# Patient Record
Sex: Female | Born: 1950
Health system: Southern US, Community
[De-identification: ages and names within clinical notes are randomized; demographics above are authoritative.]

## PROBLEM LIST (undated history)

## (undated) DIAGNOSIS — R0600 Dyspnea, unspecified: Secondary | ICD-10-CM

## (undated) DIAGNOSIS — D649 Anemia, unspecified: Secondary | ICD-10-CM

## (undated) DIAGNOSIS — F988 Other specified behavioral and emotional disorders with onset usually occurring in childhood and adolescence: Secondary | ICD-10-CM

## (undated) DIAGNOSIS — Z87442 Personal history of urinary calculi: Secondary | ICD-10-CM

## (undated) DIAGNOSIS — R109 Unspecified abdominal pain: Secondary | ICD-10-CM

## (undated) DIAGNOSIS — M519 Unspecified thoracic, thoracolumbar and lumbosacral intervertebral disc disorder: Secondary | ICD-10-CM

## (undated) DIAGNOSIS — J449 Chronic obstructive pulmonary disease, unspecified: Secondary | ICD-10-CM

## (undated) DIAGNOSIS — M545 Low back pain: Principal | ICD-10-CM

## (undated) DIAGNOSIS — J309 Allergic rhinitis, unspecified: Secondary | ICD-10-CM

## (undated) DIAGNOSIS — R10819 Abdominal tenderness, unspecified site: Secondary | ICD-10-CM

## (undated) DIAGNOSIS — K219 Gastro-esophageal reflux disease without esophagitis: Secondary | ICD-10-CM

## (undated) DIAGNOSIS — K0501 Acute gingivitis, non-plaque induced: Secondary | ICD-10-CM

## (undated) DIAGNOSIS — J441 Chronic obstructive pulmonary disease with (acute) exacerbation: Secondary | ICD-10-CM

## (undated) DIAGNOSIS — M549 Dorsalgia, unspecified: Secondary | ICD-10-CM

## (undated) DIAGNOSIS — F1011 Alcohol abuse, in remission: Secondary | ICD-10-CM

## (undated) DIAGNOSIS — E039 Hypothyroidism, unspecified: Secondary | ICD-10-CM

## (undated) DIAGNOSIS — F411 Generalized anxiety disorder: Secondary | ICD-10-CM

## (undated) DIAGNOSIS — G8921 Chronic pain due to trauma: Secondary | ICD-10-CM

## (undated) DIAGNOSIS — A088 Other specified intestinal infections: Secondary | ICD-10-CM

## (undated) DIAGNOSIS — N83209 Unspecified ovarian cyst, unspecified side: Secondary | ICD-10-CM

## (undated) DIAGNOSIS — I10 Essential (primary) hypertension: Secondary | ICD-10-CM

## (undated) DIAGNOSIS — F329 Major depressive disorder, single episode, unspecified: Secondary | ICD-10-CM

## (undated) DIAGNOSIS — N390 Urinary tract infection, site not specified: Secondary | ICD-10-CM

## (undated) DIAGNOSIS — M479 Spondylosis, unspecified: Secondary | ICD-10-CM

## (undated) DIAGNOSIS — M1990 Unspecified osteoarthritis, unspecified site: Secondary | ICD-10-CM

## (undated) DIAGNOSIS — R51 Headache: Secondary | ICD-10-CM

## (undated) DIAGNOSIS — E559 Vitamin D deficiency, unspecified: Secondary | ICD-10-CM

## (undated) HISTORY — PX: LIPOSUCTION: SHX10

## (undated) HISTORY — PX: OTHER SURGICAL HISTORY: SHX169

## (undated) HISTORY — DX: Unspecified abdominal pain: R10.9

## (undated) HISTORY — PX: ABDOMINAL HYSTERECTOMY: SHX81

## (undated) HISTORY — PX: CATARACT EXTRACTION W/ INTRAOCULAR LENS IMPLANT: SHX1309

## (undated) HISTORY — DX: Allergic rhinitis, unspecified: J30.9

## (undated) HISTORY — DX: Chronic obstructive pulmonary disease with (acute) exacerbation: J44.1

## (undated) HISTORY — DX: Spondylosis, unspecified: M47.9

## (undated) HISTORY — DX: Unspecified thoracic, thoracolumbar and lumbosacral intervertebral disc disorder: M51.9

## (undated) HISTORY — DX: Other specified behavioral and emotional disorders with onset usually occurring in childhood and adolescence: F98.8

## (undated) HISTORY — DX: Unspecified ovarian cyst, unspecified side: N83.209

## (undated) HISTORY — DX: Alcohol abuse, in remission: F10.11

## (undated) HISTORY — DX: Other specified intestinal infections: A08.8

## (undated) HISTORY — DX: Gastro-esophageal reflux disease without esophagitis: K21.9

## (undated) HISTORY — PX: LUMBAR DISC SURGERY: SHX700

## (undated) HISTORY — DX: Urinary tract infection, site not specified: N39.0

## (undated) HISTORY — DX: Abdominal tenderness, unspecified site: R10.819

## (undated) HISTORY — DX: Dorsalgia, unspecified: M54.9

## (undated) HISTORY — DX: Headache: R51

## (undated) HISTORY — DX: Vitamin D deficiency, unspecified: E55.9

## (undated) HISTORY — DX: Major depressive disorder, single episode, unspecified: F32.9

## (undated) HISTORY — DX: Acute gingivitis, non-plaque induced: K05.01

## (undated) HISTORY — DX: Generalized anxiety disorder: F41.1

## (undated) HISTORY — DX: Chronic obstructive pulmonary disease, unspecified: J44.9

## (undated) HISTORY — DX: Chronic pain due to trauma: G89.21

## (undated) HISTORY — DX: Unspecified osteoarthritis, unspecified site: M19.90

## (undated) HISTORY — PX: CHOLECYSTECTOMY: SHX55

## (undated) HISTORY — DX: Hypothyroidism, unspecified: E03.9

## (undated) HISTORY — DX: Low back pain: M54.5

---

## 1998-06-26 ENCOUNTER — Emergency Department (HOSPITAL_COMMUNITY): Admission: EM | Admit: 1998-06-26 | Discharge: 1998-06-26 | Payer: Self-pay | Admitting: Emergency Medicine

## 1999-12-20 ENCOUNTER — Other Ambulatory Visit: Admission: RE | Admit: 1999-12-20 | Discharge: 1999-12-20 | Payer: Self-pay | Admitting: Obstetrics and Gynecology

## 1999-12-29 ENCOUNTER — Encounter: Payer: Self-pay | Admitting: Obstetrics and Gynecology

## 1999-12-29 ENCOUNTER — Encounter: Admission: RE | Admit: 1999-12-29 | Discharge: 1999-12-29 | Payer: Self-pay | Admitting: Obstetrics and Gynecology

## 2000-01-05 ENCOUNTER — Encounter: Payer: Self-pay | Admitting: Gastroenterology

## 2000-01-05 ENCOUNTER — Ambulatory Visit (HOSPITAL_COMMUNITY): Admission: RE | Admit: 2000-01-05 | Discharge: 2000-01-05 | Payer: Self-pay | Admitting: Gastroenterology

## 2000-01-10 ENCOUNTER — Encounter: Payer: Self-pay | Admitting: General Surgery

## 2000-01-11 ENCOUNTER — Encounter (INDEPENDENT_AMBULATORY_CARE_PROVIDER_SITE_OTHER): Payer: Self-pay | Admitting: *Deleted

## 2000-01-11 ENCOUNTER — Ambulatory Visit (HOSPITAL_COMMUNITY): Admission: RE | Admit: 2000-01-11 | Discharge: 2000-01-12 | Payer: Self-pay | Admitting: General Surgery

## 2000-12-22 ENCOUNTER — Other Ambulatory Visit: Admission: RE | Admit: 2000-12-22 | Discharge: 2000-12-22 | Payer: Self-pay | Admitting: Obstetrics and Gynecology

## 2002-03-22 ENCOUNTER — Encounter: Payer: Self-pay | Admitting: Emergency Medicine

## 2002-03-22 ENCOUNTER — Emergency Department (HOSPITAL_COMMUNITY): Admission: EM | Admit: 2002-03-22 | Discharge: 2002-03-22 | Payer: Self-pay | Admitting: Emergency Medicine

## 2004-02-02 ENCOUNTER — Emergency Department (HOSPITAL_COMMUNITY): Admission: EM | Admit: 2004-02-02 | Discharge: 2004-02-02 | Payer: Self-pay | Admitting: Family Medicine

## 2004-11-21 ENCOUNTER — Encounter: Payer: Self-pay | Admitting: Internal Medicine

## 2004-11-21 LAB — CONVERTED CEMR LAB

## 2004-11-21 LAB — HM MAMMOGRAPHY

## 2006-01-26 ENCOUNTER — Emergency Department (HOSPITAL_COMMUNITY): Admission: EM | Admit: 2006-01-26 | Discharge: 2006-01-26 | Payer: Self-pay | Admitting: Family Medicine

## 2006-08-18 ENCOUNTER — Ambulatory Visit: Payer: Self-pay | Admitting: Internal Medicine

## 2006-11-06 ENCOUNTER — Ambulatory Visit: Payer: Self-pay | Admitting: Internal Medicine

## 2006-12-18 ENCOUNTER — Ambulatory Visit: Payer: Self-pay | Admitting: Internal Medicine

## 2006-12-19 ENCOUNTER — Ambulatory Visit: Payer: Self-pay | Admitting: Internal Medicine

## 2007-07-02 ENCOUNTER — Ambulatory Visit: Payer: Self-pay | Admitting: Internal Medicine

## 2007-07-02 DIAGNOSIS — E039 Hypothyroidism, unspecified: Secondary | ICD-10-CM | POA: Insufficient documentation

## 2007-07-02 DIAGNOSIS — J449 Chronic obstructive pulmonary disease, unspecified: Secondary | ICD-10-CM | POA: Insufficient documentation

## 2007-07-02 DIAGNOSIS — M1990 Unspecified osteoarthritis, unspecified site: Secondary | ICD-10-CM

## 2007-07-02 DIAGNOSIS — F3289 Other specified depressive episodes: Secondary | ICD-10-CM

## 2007-07-02 DIAGNOSIS — F411 Generalized anxiety disorder: Secondary | ICD-10-CM | POA: Insufficient documentation

## 2007-07-02 DIAGNOSIS — G8921 Chronic pain due to trauma: Secondary | ICD-10-CM | POA: Insufficient documentation

## 2007-07-02 DIAGNOSIS — F32A Depression, unspecified: Secondary | ICD-10-CM | POA: Insufficient documentation

## 2007-07-02 DIAGNOSIS — F329 Major depressive disorder, single episode, unspecified: Secondary | ICD-10-CM

## 2007-07-02 DIAGNOSIS — M479 Spondylosis, unspecified: Secondary | ICD-10-CM | POA: Insufficient documentation

## 2007-07-02 DIAGNOSIS — R519 Headache, unspecified: Secondary | ICD-10-CM | POA: Insufficient documentation

## 2007-07-02 DIAGNOSIS — R51 Headache: Secondary | ICD-10-CM

## 2007-07-02 DIAGNOSIS — J4489 Other specified chronic obstructive pulmonary disease: Secondary | ICD-10-CM

## 2007-07-02 HISTORY — DX: Other specified chronic obstructive pulmonary disease: J44.89

## 2007-07-02 HISTORY — DX: Generalized anxiety disorder: F41.1

## 2007-07-02 HISTORY — DX: Unspecified osteoarthritis, unspecified site: M19.90

## 2007-07-02 HISTORY — DX: Major depressive disorder, single episode, unspecified: F32.9

## 2007-07-02 HISTORY — DX: Headache: R51

## 2007-07-02 HISTORY — DX: Spondylosis, unspecified: M47.9

## 2007-07-02 HISTORY — DX: Hypothyroidism, unspecified: E03.9

## 2007-07-02 HISTORY — DX: Chronic pain due to trauma: G89.21

## 2007-07-02 HISTORY — DX: Other specified depressive episodes: F32.89

## 2007-07-02 HISTORY — DX: Chronic obstructive pulmonary disease, unspecified: J44.9

## 2007-08-06 ENCOUNTER — Ambulatory Visit: Payer: Self-pay | Admitting: Internal Medicine

## 2007-09-27 DIAGNOSIS — F1011 Alcohol abuse, in remission: Secondary | ICD-10-CM

## 2007-09-27 HISTORY — DX: Alcohol abuse, in remission: F10.11

## 2007-10-31 ENCOUNTER — Emergency Department (HOSPITAL_COMMUNITY): Admission: EM | Admit: 2007-10-31 | Discharge: 2007-10-31 | Payer: Self-pay | Admitting: Family Medicine

## 2008-03-21 ENCOUNTER — Ambulatory Visit: Payer: Self-pay | Admitting: Internal Medicine

## 2008-06-19 ENCOUNTER — Telehealth (INDEPENDENT_AMBULATORY_CARE_PROVIDER_SITE_OTHER): Payer: Self-pay | Admitting: *Deleted

## 2008-09-05 ENCOUNTER — Ambulatory Visit: Payer: Self-pay | Admitting: Internal Medicine

## 2008-09-05 DIAGNOSIS — F988 Other specified behavioral and emotional disorders with onset usually occurring in childhood and adolescence: Secondary | ICD-10-CM

## 2008-09-05 HISTORY — DX: Other specified behavioral and emotional disorders with onset usually occurring in childhood and adolescence: F98.8

## 2008-12-18 ENCOUNTER — Telehealth: Payer: Self-pay | Admitting: Internal Medicine

## 2009-03-02 ENCOUNTER — Ambulatory Visit: Payer: Self-pay | Admitting: Internal Medicine

## 2009-03-02 DIAGNOSIS — J309 Allergic rhinitis, unspecified: Secondary | ICD-10-CM

## 2009-03-02 HISTORY — DX: Allergic rhinitis, unspecified: J30.9

## 2009-03-02 LAB — CONVERTED CEMR LAB: Cholesterol, target level: 200 mg/dL

## 2009-05-10 ENCOUNTER — Observation Stay (HOSPITAL_COMMUNITY): Admission: EM | Admit: 2009-05-10 | Discharge: 2009-05-11 | Payer: Self-pay | Admitting: Emergency Medicine

## 2009-05-12 ENCOUNTER — Telehealth (INDEPENDENT_AMBULATORY_CARE_PROVIDER_SITE_OTHER): Payer: Self-pay | Admitting: *Deleted

## 2009-05-19 ENCOUNTER — Ambulatory Visit: Payer: Self-pay | Admitting: Internal Medicine

## 2009-05-19 ENCOUNTER — Telehealth: Payer: Self-pay | Admitting: Internal Medicine

## 2009-05-19 DIAGNOSIS — M549 Dorsalgia, unspecified: Secondary | ICD-10-CM | POA: Insufficient documentation

## 2009-05-19 DIAGNOSIS — N390 Urinary tract infection, site not specified: Secondary | ICD-10-CM | POA: Insufficient documentation

## 2009-05-19 DIAGNOSIS — N83209 Unspecified ovarian cyst, unspecified side: Secondary | ICD-10-CM | POA: Insufficient documentation

## 2009-05-19 HISTORY — DX: Unspecified ovarian cyst, unspecified side: N83.209

## 2009-05-19 HISTORY — DX: Dorsalgia, unspecified: M54.9

## 2009-05-19 HISTORY — DX: Urinary tract infection, site not specified: N39.0

## 2009-07-07 ENCOUNTER — Encounter: Admission: RE | Admit: 2009-07-07 | Discharge: 2009-07-07 | Payer: Self-pay | Admitting: Internal Medicine

## 2009-08-06 ENCOUNTER — Ambulatory Visit: Payer: Self-pay | Admitting: Internal Medicine

## 2009-08-06 DIAGNOSIS — J441 Chronic obstructive pulmonary disease with (acute) exacerbation: Secondary | ICD-10-CM

## 2009-08-06 DIAGNOSIS — J019 Acute sinusitis, unspecified: Secondary | ICD-10-CM | POA: Insufficient documentation

## 2009-08-06 DIAGNOSIS — M5137 Other intervertebral disc degeneration, lumbosacral region: Secondary | ICD-10-CM | POA: Insufficient documentation

## 2009-08-06 HISTORY — DX: Chronic obstructive pulmonary disease with (acute) exacerbation: J44.1

## 2009-10-05 ENCOUNTER — Encounter: Payer: Self-pay | Admitting: Internal Medicine

## 2009-12-10 ENCOUNTER — Ambulatory Visit: Payer: Self-pay | Admitting: Internal Medicine

## 2009-12-14 ENCOUNTER — Ambulatory Visit: Payer: Self-pay | Admitting: Internal Medicine

## 2009-12-14 DIAGNOSIS — J069 Acute upper respiratory infection, unspecified: Secondary | ICD-10-CM | POA: Insufficient documentation

## 2009-12-14 LAB — CONVERTED CEMR LAB
Albumin: 3.9 g/dL (ref 3.5–5.2)
BUN: 11 mg/dL (ref 6–23)
Basophils Relative: 0.5 % (ref 0.0–3.0)
Bilirubin Urine: NEGATIVE
Calcium: 9.1 mg/dL (ref 8.4–10.5)
Cholesterol: 190 mg/dL (ref 0–200)
Eosinophils Absolute: 0.2 10*3/uL (ref 0.0–0.7)
Eosinophils Relative: 2.4 % (ref 0.0–5.0)
GFR calc non Af Amer: 78.17 mL/min (ref >60)
Glucose, Bld: 97 mg/dL (ref 70–99)
HCT: 37 % (ref 36.0–46.0)
HDL: 59.3 mg/dL (ref >39.00)
Hemoglobin, Urine: NEGATIVE
Ketones, ur: NEGATIVE mg/dL
Lymphs Abs: 2.7 10*3/uL (ref 0.7–4.0)
MCHC: 32.7 g/dL (ref 30.0–36.0)
MCV: 91.7 fL (ref 78.0–100.0)
Monocytes Absolute: 0.4 10*3/uL (ref 0.1–1.0)
Neutrophils Relative %: 50 % (ref 43.0–77.0)
Nitrite: NEGATIVE
Platelets: 266 10*3/uL (ref 150.0–400.0)
Potassium: 3.9 meq/L (ref 3.5–5.1)
TSH: 1.17 microintl units/mL (ref 0.35–5.50)
Total Bilirubin: 0.7 mg/dL (ref 0.3–1.2)
Total Protein, Urine: NEGATIVE mg/dL
Triglycerides: 104 mg/dL (ref 0.0–149.0)
Urine Glucose: NEGATIVE mg/dL
VLDL: 20.8 mg/dL (ref 0.0–40.0)
WBC: 6.6 10*3/uL (ref 4.5–10.5)
pH: 6.5 (ref 5.0–8.0)

## 2010-03-19 ENCOUNTER — Telehealth: Payer: Self-pay | Admitting: Internal Medicine

## 2010-03-29 ENCOUNTER — Ambulatory Visit: Payer: Self-pay | Admitting: Internal Medicine

## 2010-03-29 DIAGNOSIS — R109 Unspecified abdominal pain: Secondary | ICD-10-CM | POA: Insufficient documentation

## 2010-03-29 HISTORY — DX: Unspecified abdominal pain: R10.9

## 2010-06-11 ENCOUNTER — Ambulatory Visit: Payer: Self-pay | Admitting: Internal Medicine

## 2010-06-11 DIAGNOSIS — K0501 Acute gingivitis, non-plaque induced: Secondary | ICD-10-CM | POA: Insufficient documentation

## 2010-06-11 DIAGNOSIS — R1013 Epigastric pain: Secondary | ICD-10-CM

## 2010-06-11 DIAGNOSIS — K3189 Other diseases of stomach and duodenum: Secondary | ICD-10-CM | POA: Insufficient documentation

## 2010-06-11 HISTORY — DX: Acute gingivitis, non-plaque induced: K05.01

## 2010-09-07 ENCOUNTER — Telehealth: Payer: Self-pay | Admitting: Internal Medicine

## 2010-11-29 ENCOUNTER — Ambulatory Visit
Admission: RE | Admit: 2010-11-29 | Discharge: 2010-11-29 | Payer: Self-pay | Source: Home / Self Care | Attending: Internal Medicine | Admitting: Internal Medicine

## 2010-11-29 ENCOUNTER — Encounter (INDEPENDENT_AMBULATORY_CARE_PROVIDER_SITE_OTHER): Payer: Self-pay | Admitting: *Deleted

## 2010-11-29 DIAGNOSIS — A088 Other specified intestinal infections: Secondary | ICD-10-CM | POA: Insufficient documentation

## 2010-11-29 DIAGNOSIS — E559 Vitamin D deficiency, unspecified: Secondary | ICD-10-CM | POA: Insufficient documentation

## 2010-11-29 HISTORY — DX: Other specified intestinal infections: A08.8

## 2010-12-02 ENCOUNTER — Other Ambulatory Visit: Payer: Self-pay | Admitting: Internal Medicine

## 2010-12-02 ENCOUNTER — Ambulatory Visit
Admission: RE | Admit: 2010-12-02 | Discharge: 2010-12-02 | Payer: Self-pay | Source: Home / Self Care | Attending: Internal Medicine | Admitting: Internal Medicine

## 2010-12-02 ENCOUNTER — Encounter: Payer: Self-pay | Admitting: Internal Medicine

## 2010-12-02 ENCOUNTER — Ambulatory Visit: Admit: 2010-12-02 | Payer: Self-pay | Admitting: Internal Medicine

## 2010-12-02 LAB — URINALYSIS, ROUTINE W REFLEX MICROSCOPIC
Bilirubin Urine: NEGATIVE
Nitrite: POSITIVE
Specific Gravity, Urine: 1.025 (ref 1.000–1.030)
Total Protein, Urine: NEGATIVE
Urine Glucose: NEGATIVE
Urobilinogen, UA: 0.2 (ref 0.0–1.0)
pH: 5.5 (ref 5.0–8.0)

## 2010-12-02 LAB — CBC WITH DIFFERENTIAL/PLATELET
Basophils Absolute: 0 K/uL (ref 0.0–0.1)
Basophils Relative: 0.6 % (ref 0.0–3.0)
Eosinophils Absolute: 0.1 K/uL (ref 0.0–0.7)
Eosinophils Relative: 2.1 % (ref 0.0–5.0)
HCT: 39 % (ref 36.0–46.0)
Hemoglobin: 13.6 g/dL (ref 12.0–15.0)
Lymphocytes Relative: 45.6 % (ref 12.0–46.0)
Lymphs Abs: 3 K/uL (ref 0.7–4.0)
MCHC: 34.7 g/dL (ref 30.0–36.0)
MCV: 86.8 fl (ref 78.0–100.0)
Monocytes Absolute: 0.4 K/uL (ref 0.1–1.0)
Monocytes Relative: 6.7 % (ref 3.0–12.0)
Neutro Abs: 3 K/uL (ref 1.4–7.7)
Neutrophils Relative %: 45 % (ref 43.0–77.0)
Platelets: 324 K/uL (ref 150.0–400.0)
RBC: 4.49 Mil/uL (ref 3.87–5.11)
RDW: 12.6 % (ref 11.5–14.6)
WBC: 6.6 K/uL (ref 4.5–10.5)

## 2010-12-02 LAB — BASIC METABOLIC PANEL
BUN: 10 mg/dL (ref 6–23)
CO2: 26 mEq/L (ref 19–32)
Calcium: 8.6 mg/dL (ref 8.4–10.5)
Chloride: 103 mEq/L (ref 96–112)
Creatinine, Ser: 0.8 mg/dL (ref 0.4–1.2)
GFR: 73.64 mL/min (ref >60.00)
Glucose, Bld: 77 mg/dL (ref 70–99)
Potassium: 3.7 mEq/L (ref 3.5–5.1)
Sodium: 137 mEq/L (ref 135–145)

## 2010-12-02 LAB — HEPATIC FUNCTION PANEL
ALT: 13 U/L (ref 0–35)
AST: 15 U/L (ref 0–37)
Albumin: 4.1 g/dL (ref 3.5–5.2)
Alkaline Phosphatase: 61 U/L (ref 39–117)
Bilirubin, Direct: 0.1 mg/dL (ref 0.0–0.3)
Total Bilirubin: 0.5 mg/dL (ref 0.3–1.2)
Total Protein: 7 g/dL (ref 6.0–8.3)

## 2010-12-02 LAB — LIPID PANEL
Cholesterol: 203 mg/dL — ABNORMAL HIGH (ref 0–200)
HDL: 69.9 mg/dL (ref >39.00)
Total CHOL/HDL Ratio: 3
Triglycerides: 66 mg/dL (ref 0.0–149.0)
VLDL: 13.2 mg/dL (ref 0.0–40.0)

## 2010-12-02 LAB — H. PYLORI ANTIBODY, IGG: H Pylori IgG: POSITIVE

## 2010-12-02 LAB — TSH: TSH: 1.75 u[IU]/mL (ref 0.35–5.50)

## 2010-12-03 LAB — LDL CHOLESTEROL, DIRECT: Direct LDL: 121.4 mg/dL

## 2010-12-08 ENCOUNTER — Encounter (INDEPENDENT_AMBULATORY_CARE_PROVIDER_SITE_OTHER): Payer: Self-pay | Admitting: *Deleted

## 2010-12-12 ENCOUNTER — Encounter: Payer: Self-pay | Admitting: Internal Medicine

## 2010-12-12 LAB — CONVERTED CEMR LAB: Vit D, 25-Hydroxy: 48 ng/mL (ref 30–89)

## 2010-12-13 ENCOUNTER — Encounter: Payer: Self-pay | Admitting: Internal Medicine

## 2010-12-21 NOTE — Progress Notes (Signed)
Summary: Med change?  Phone Note From Pharmacy   Caller: [facility] [phone] Summary of Call: Pharmacy called stating that Adderall 30mg  is on citywide  back order. Pharmacy is requesting MD's okay to change to 15mg  two times a day #60 x 0. Okay to change? Initial call taken by: Margaret Pyle, CMA,  September 07, 2010 10:59 AM  Follow-up for Phone Call        yes Follow-up by: Corwin Levins MD,  September 07, 2010 1:09 PM  Additional Follow-up for Phone Call Additional follow up Details #1::        Aneta Mins at pharmacy authorized to change Rx Additional Follow-up by: Margaret Pyle, CMA,  September 07, 2010 1:17 PM

## 2010-12-21 NOTE — Progress Notes (Signed)
Summary: Med Refill  Phone Note Refill Request  on March 19, 2010 4:42 PM  Refills Requested: Medication #1:  ADDERALL 30 MG  TABS 1 by mouth once daily - to fill mar 25   Dosage confirmed as above?Dosage Confirmed   Notes: [facility] Initial call taken by: Scharlene Gloss,  March 19, 2010 4:42 PM  Follow-up for Phone Call        left mess to call office back, Rx up front .............Marland KitchenLamar Sprinkles, CMA  March 20, 2010 12:25 PM   Additional Follow-up for Phone Call Additional follow up Details #1::        pt informed Additional Follow-up by: Margaret Pyle, CMA,  Mar 22, 2010 8:48 AM    New/Updated Medications: ADDERALL 30 MG  TABS (AMPHETAMINE-DEXTROAMPHETAMINE) 1 by mouth once daily - to fill Mar 19, 2010 Prescriptions: ADDERALL 30 MG  TABS (AMPHETAMINE-DEXTROAMPHETAMINE) 1 by mouth once daily - to fill Mar 19, 2010  #30 x 0   Entered and Authorized by:   Corwin Levins MD   Signed by:   Corwin Levins MD on 03/19/2010   Method used:   Print then Give to Patient   RxID:   [id number]  done hardcopy to LIM side B - dahlia  Corwin Levins MD  March 19, 2010 5:35 PM
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
## 2010-12-23 NOTE — Letter (Signed)
Summary: Generic Letter  [facility]  [address] [place], [place] [address]   Phone: [phone]  Fax: [phone]    12/08/2010  Ryker Geister [address] [place], [place]  [address]  Dear Ms. Skolnick,   Our office has been trying to contact you several times concerning your labs that you had done on 12/02/10. We have left several messages requesting that you return our call. There is some urgency concerning urine specimen that was given. Please at your earliest convience contact our office, and update contact numbers so we can get in contact with you in the future. Please call our office at [phone].        Sincerely,   Orlan Leavens RMA/ Dr. Oliver Barre

## 2010-12-23 NOTE — Letter (Signed)
Summary: Out of Work  [facility]  [address] [place], [place] [address]   Phone: [phone]  Fax: [phone]    November 29, 2010   Employee:  Kathy Vargas    To Whom It May Concern:   For Medical reasons, please excuse the above named employee from work for the following dates:  Start:   11/29/2010  End:   11/30/2010  Return to work 12/01/2010  If you need additional information, please feel free to contact our office.         Sincerely,    Dr. Oliver Barre

## 2011-01-21 ENCOUNTER — Ambulatory Visit (INDEPENDENT_AMBULATORY_CARE_PROVIDER_SITE_OTHER): Payer: BC Managed Care – PPO | Admitting: Internal Medicine

## 2011-01-21 ENCOUNTER — Encounter: Payer: Self-pay | Admitting: Internal Medicine

## 2011-01-21 DIAGNOSIS — K219 Gastro-esophageal reflux disease without esophagitis: Secondary | ICD-10-CM

## 2011-01-21 DIAGNOSIS — R10819 Abdominal tenderness, unspecified site: Secondary | ICD-10-CM

## 2011-01-21 DIAGNOSIS — F988 Other specified behavioral and emotional disorders with onset usually occurring in childhood and adolescence: Secondary | ICD-10-CM

## 2011-01-21 DIAGNOSIS — F411 Generalized anxiety disorder: Secondary | ICD-10-CM

## 2011-01-21 DIAGNOSIS — N390 Urinary tract infection, site not specified: Secondary | ICD-10-CM

## 2011-01-21 HISTORY — DX: Gastro-esophageal reflux disease without esophagitis: K21.9

## 2011-01-21 HISTORY — DX: Abdominal tenderness, unspecified site: R10.819

## 2011-01-21 LAB — CONVERTED CEMR LAB
Glucose, Urine, Semiquant: NEGATIVE
Ketones, urine, test strip: NEGATIVE
Nitrite: NEGATIVE
Specific Gravity, Urine: 1.01
Urobilinogen, UA: 0.2
pH: 6.5
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
## 2011-02-28 LAB — POCT CARDIAC MARKERS
CKMB, poc: <1 ng/mL — ABNORMAL LOW (ref 1.0–8.0)
CKMB, poc: <1 ng/mL — ABNORMAL LOW (ref 1.0–8.0)
CKMB, poc: <1 ng/mL — ABNORMAL LOW (ref 1.0–8.0)
Myoglobin, poc: 35 ng/mL (ref 12–200)
Myoglobin, poc: 39 ng/mL (ref 12–200)
Myoglobin, poc: 45.3 ng/mL (ref 12–200)
Troponin i, poc: <0.05 ng/mL (ref 0.00–0.09)
Troponin i, poc: <0.05 ng/mL (ref 0.00–0.09)
Troponin i, poc: <0.05 ng/mL (ref 0.00–0.09)

## 2011-02-28 LAB — DIFFERENTIAL
Lymphocytes Relative: 40 % (ref 12–46)
Monocytes Absolute: 0.7 10*3/uL (ref 0.1–1.0)
Monocytes Relative: 9 % (ref 3–12)
Neutro Abs: 3.7 10*3/uL (ref 1.7–7.7)

## 2011-02-28 LAB — URINALYSIS, ROUTINE W REFLEX MICROSCOPIC
Glucose, UA: NEGATIVE mg/dL
Ketones, ur: 15 mg/dL — AB
Protein, ur: NEGATIVE mg/dL
Urobilinogen, UA: 1 mg/dL (ref 0.0–1.0)

## 2011-02-28 LAB — POCT I-STAT, CHEM 8
BUN: 16 mg/dL (ref 6–23)
Calcium, Ion: 1.24 mmol/L (ref 1.12–1.32)
Chloride: 101 mEq/L (ref 96–112)
Creatinine, Ser: 1 mg/dL (ref 0.4–1.2)
Glucose, Bld: 82 mg/dL (ref 70–99)
HCT: 41 % (ref 36.0–46.0)
Hemoglobin: 13.9 g/dL (ref 12.0–15.0)
Potassium: 4.1 mEq/L (ref 3.5–5.1)
Sodium: 138 meq/L (ref 135–145)
TCO2: 28 mmol/L (ref 0–100)

## 2011-02-28 LAB — URINE MICROSCOPIC-ADD ON

## 2011-02-28 LAB — CBC
HCT: 40.3 % (ref 36.0–46.0)
Hemoglobin: 13.8 g/dL (ref 12.0–15.0)
RBC: 4.56 MIL/uL (ref 3.87–5.11)

## 2011-02-28 LAB — URINE CULTURE: Colony Count: 6000

## 2011-04-08 NOTE — Op Note (Signed)
[place]  Patient:    Kathy Vargas, Kathy Vargas                    MRN: [id number] Proc. Date: 01/11/00 Adm. Date:  [id number] Attending:  Tempie Donning CC:         Esmeralda Arthur, M.D.             James L. Randa Evens, M.D.                           Operative Report  PREOPERATIVE DIAGNOSIS:  Cholelithiasis, cholecystitis.  POSTOPERATIVE DIAGNOSIS:  Cholelithiasis, cholecystitis.  OPERATIVE PROCEDURE:  Laparoscopic cholecystectomy.  SURGEON:  Gita Kudo, M.D.  ASSISTANT:  Anselm Pancoast. Zachery Dakins, M.D.  ANESTHESIA:  General endotracheal.  PREOPERATIVE DIAGNOSIS:  Cholelithiasis, cholecystitis.  POSTOPERATIVE DIAGNOSIS:  Cholelithiasis, cholecystitis.  CLINICAL SUMMARY:  A 60 year old female with bouts of right upper quadrant abdominal atrial fibrillation pain.  Gallbladder ultrasound shows multiple stones and a normal biliary tree.  Liver function studies are normal.  OPERATIVE FINDINGS:  The patients gallbladder was thin-walled and not at all inflamed.  Her cystic duct was small and normal in anatomy and size as was the cystic artery.  DESCRIPTION OF PROCEDURE:  Under satisfactory general endotracheal anesthesia, he patients abdomen was prepared and draped in a standard fashion.  A transverse supraumbilical incision was made above and the midline opened into the peritoneum. This was controlled with a figure-of-eight 0 Vicryl suture and a Hasson operating port inserted and secured, with CO2 pneumoperitoneum established and camera placed.  With excellent exposure and visualization, two #5 ports were placed laterally and a second #10 port medially.  These sites are infiltrated with Marcaine with epinephrine for postop analgesia.  Then, a dissector was placed through the upper port and graspers in the lateral port gave good exposure. The gallbladder cystic duct junction was identified and dissected.  The cystic artery was  likewise identified and dissected.  A right angle clamp was used to dissect  these structures circumferentially and when we were sure of the anatomy, three clips placed on the  stay side and single clip on the distal side and the structures were transected.  The gallbladder was then removed from the liver bed, from below upward using the coagulating spatula for hemostasis and dissection. A small hole was made in the gallbladder with a little bile leakage but no stones. The dissection was completed and then the camera moved to the upper port. Through the lower port, a EndoCatch bag was placed and the gallbladder put in this and retrieved successfully without spillage or complication.  Following this, the operative site was checked for hemostasis which was good.  The abdomen was lavaged with saline and the returns were clear without any evidence of remaining bile.  Then, CO2 was released and all ports removed.  The ports were again infiltrated  with Marcaine for postop analgesia.  The midline closed with a previous suture nd a second single 0 Vicryl interrupted.  Then, all subcu approximated with 4-0 Vicryl and skin Steri-Strips applied.  There were no complications and the sponge and needle counts were correct.  The patient went to the recovery room from the operating room in good condition without complication. DD:  01/11/00 TD:  01/11/00 Job: [id number]

## 2011-05-26 ENCOUNTER — Encounter: Payer: Self-pay | Admitting: Internal Medicine

## 2011-05-29 ENCOUNTER — Encounter: Payer: Self-pay | Admitting: Internal Medicine

## 2011-05-29 DIAGNOSIS — Z0001 Encounter for general adult medical examination with abnormal findings: Secondary | ICD-10-CM | POA: Insufficient documentation

## 2011-06-01 ENCOUNTER — Ambulatory Visit (INDEPENDENT_AMBULATORY_CARE_PROVIDER_SITE_OTHER): Payer: Self-pay | Admitting: Internal Medicine

## 2011-06-01 ENCOUNTER — Encounter: Payer: Self-pay | Admitting: Internal Medicine

## 2011-06-01 VITALS — BP 122/78 | HR 71 | Temp 99.0°F | Ht 64.0 in | Wt 122.1 lb

## 2011-06-01 DIAGNOSIS — M545 Low back pain, unspecified: Secondary | ICD-10-CM

## 2011-06-01 DIAGNOSIS — F411 Generalized anxiety disorder: Secondary | ICD-10-CM

## 2011-06-01 DIAGNOSIS — M79604 Pain in right leg: Secondary | ICD-10-CM

## 2011-06-01 DIAGNOSIS — F988 Other specified behavioral and emotional disorders with onset usually occurring in childhood and adolescence: Secondary | ICD-10-CM

## 2011-06-01 HISTORY — DX: Low back pain, unspecified: M54.50

## 2011-06-01 HISTORY — DX: Pain in right leg: M79.604

## 2011-06-01 MED ORDER — ALPRAZOLAM 0.5 MG PO TABS: 0.5000 mg | ORAL_TABLET | Freq: Every day | ORAL | Status: DC | PRN

## 2011-06-01 MED ORDER — CITALOPRAM HYDROBROMIDE 20 MG PO TABS: 20.0000 mg | ORAL_TABLET | Freq: Every day | ORAL | Status: DC

## 2011-06-01 MED ORDER — ALPRAZOLAM 0.5 MG PO TABS: ORAL_TABLET | ORAL | Status: DC

## 2011-06-01 MED ORDER — AMPHETAMINE-DEXTROAMPHETAMINE 30 MG PO TABS: 30.0000 mg | ORAL_TABLET | Freq: Every day | ORAL | Status: DC

## 2011-06-01 MED ORDER — OXYCODONE HCL 5 MG PO TABS: ORAL_TABLET | ORAL | Status: DC

## 2011-06-01 MED ORDER — PREDNISONE 10 MG PO TABS: 10.0000 mg | ORAL_TABLET | Freq: Every day | ORAL | Status: DC

## 2011-06-01 NOTE — Assessment & Plan Note (Signed)
stable overall by hx and exam, most recent data reviewed with pt, and pt to continue medical treatment as before  Lab Results  Component Value Date   WBC 6.6 12/02/2010   HGB 13.6 12/02/2010   HCT 39.0 12/02/2010   PLT 324.0 12/02/2010   CHOL 203* 12/02/2010   TRIG 66.0 12/02/2010   HDL 69.90 12/02/2010   LDLDIRECT 121.4 12/02/2010   ALT 13 12/02/2010   AST 15 12/02/2010   NA 137 12/02/2010   K 3.7 12/02/2010   CL 103 12/02/2010   CREATININE 0.8 12/02/2010   BUN 10 12/02/2010   CO2 26 12/02/2010   TSH 1.75 12/02/2010

## 2011-06-01 NOTE — Assessment & Plan Note (Addendum)
With recurrent panic, lexapro to expensive without insurance even as generic; did try prozac in the past in her 40's but did not work "after 9 months", so will try citalopram 20 mg per day, and Continue all other medications as before, declines referral for counseling or psychiatry

## 2011-06-01 NOTE — Patient Instructions (Signed)
Stop the hydrocodone Start the oxycodone as prescribed Please take the prednisone as directed  Please also take the Celexa 20 mg per day for nerves and panic (is different from prozac) Continue all other medications as before You will be contacted regarding the referral for: ortho [facility]

## 2011-06-01 NOTE — Progress Notes (Signed)
Subjective:    Patient ID: Kathy Vargas, female    DOB: 1951/07/19, 60 y.o.   MRN: [id number]  HPI  Un[employer] laid off in feb 2012,  Did not get severence pay, but does get unemployment insurance.  Was not able to start the lexapro after last viist as it was too expensive, and did not realize it became generic since then.  Having mult panic attacks per wk, to the point of blurred vision, dizziness, palpitation, cant concentrate or think logically.  On top of that mentions she was doing some heaiver lifiting about the house in may, and had a wk of marked right LBP with some radiation to the post right thigh, saw chiropracter (friend paid for it), had some tx which "took the edge off" but still with mod to severe and now pain radiating to the foot, with intermittent numbness and weakness (with the weakness minor to her, only noticeable to getting up from the chair). Is right handed. But no bowel or bladder change, fever, wt loss, gait change or falls.  No health ins today, does not want "expensive tests" Past Medical History  Diagnosis Date  . ABDOMINAL TENDERNESS 01/21/2011  . ACUTE GINGIVITIS NONPLAQUE INDUCED 06/11/2010  . ADD 09/05/2008  . Alcohol abuse, in remission 09/27/2007  . ALLERGIC RHINITIS 03/02/2009  . ANXIETY 07/02/2007  . BACK PAIN 05/19/2009  . CHRONIC OBSTRUCTIVE PULMONARY DISEASE, ACUTE EXACERBATION 08/06/2009  . COPD 07/02/2007  . DEGENERATIVE JOINT DISEASE, CERVICAL SPINE 07/02/2007  . DEPRESSION 07/02/2007  . GASTROENTERITIS, VIRAL 11/29/2010  . GERD 01/21/2011  . Headache 07/02/2007  . HYPOTHYROIDISM 07/02/2007  . OSTEOARTHRITIS 07/02/2007  . PAIN, CHRONIC, DUE TO TRAUMA 07/02/2007  . PELVIC  PAIN 03/29/2010  . Other and unspecified ovarian cyst 05/19/2009  . UTI 05/19/2009  . Vitamin D insufficiency   . Lumbar disc disease    Past Surgical History  Procedure Date  . Abdominal hysterectomy   . Cholecystectomy     reports that she has been smoking.  She does not have any smokeless  tobacco history on file. She reports that she does not drink alcohol or use illicit drugs. family history includes Alcohol abuse in her other; Anxiety disorder in her other; Coronary artery disease in her other; Depression in her other; and Stroke in her other. Allergies  Allergen Reactions  . Morphine    Current Outpatient Prescriptions on File Prior to Visit  Medication Sig Dispense Refill  . ALPRAZolam (XANAX) 0.5 MG tablet Take 0.5 mg by mouth daily as needed.        Marland Kitchen amphetamine-dextroamphetamine (ADDERALL, 30MG ,) 30 MG tablet Take 30 mg by mouth daily.        Marland Kitchen HYDROcodone-acetaminophen (NORCO) 10-325 MG per tablet Take 1 tablet by mouth 4 (four) times daily as needed.        Marland Kitchen escitalopram (LEXAPRO) 10 MG tablet Take 10 mg by mouth daily.        Marland Kitchen omeprazole (PRILOSEC) 20 MG capsule Take 20 mg by mouth daily.         Review of Systems Review of Systems  Constitutional: Negative for diaphoresis and unexpected weight change.  HENT: Negative for drooling and tinnitus.   Eyes: Negative for photophobia and visual disturbance.  Respiratory: Negative for choking and stridor.   Gastrointestinal: Negative for vomiting and blood in stool.  Genitourinary: Negative for hematuria and decreased urine volume.      Objective:   Physical Exam BP 122/78  Pulse 71  Temp(Src) 99 F (  37.2 C) (Oral)  Ht 5\' 4"  (1.626 m)  Wt 122 lb 2 oz (55.396 kg)  BMI 20.96 kg/m2  SpO2 98% Physical Exam  VS noted Constitutional: Pt appears well-developed and well-nourished.  HENT: Head: Normocephalic.  Right Ear: External ear normal.  Left Ear: External ear normal.  Eyes: Conjunctivae and EOM are normal. Pupils are equal, round, and reactive to light.  Neck: Normal range of motion. Neck supple.  Cardiovascular: Normal rate and regular rhythm.   Pulmonary/Chest: Effort normal and breath sounds normal.  Abd:  Soft, NT, non-distended, + BS Neurological: Pt is alert. No cranial nerve deficit. motor/dtr/gait  ok to LE's except for mild decr sens to right ant upper leg Skin: Skin is warm. No erythema.  Psychiatric: Pt behavior is normal. Thought content normal.         Assessment & Plan:

## 2011-06-01 NOTE — Assessment & Plan Note (Signed)
Acute onset since mar 2012, hydrocodone not working well, no insurance but is willing to try oxycodone, predpack, and refer to [facility] ortho for evaluation and tx

## 2011-06-02 ENCOUNTER — Ambulatory Visit: Payer: Self-pay | Admitting: Internal Medicine

## 2011-06-10 ENCOUNTER — Telehealth: Payer: Self-pay | Admitting: *Deleted

## 2011-06-10 DIAGNOSIS — M545 Low back pain, unspecified: Secondary | ICD-10-CM

## 2011-06-10 MED ORDER — PREDNISONE 10 MG PO TABS: ORAL_TABLET | ORAL | Status: DC

## 2011-06-10 NOTE — Telephone Encounter (Signed)
Ok for repeat predpack

## 2011-06-10 NOTE — Telephone Encounter (Signed)
Patient was seen last week; waiting on referral to [facility] for leg; was given prednisone Pack at [facility]. Requesting another round of Prednisone Rx.

## 2011-06-30 ENCOUNTER — Telehealth: Payer: Self-pay

## 2011-06-30 MED ORDER — AMPHETAMINE-DEXTROAMPHETAMINE 30 MG PO TABS: 30.0000 mg | ORAL_TABLET | Freq: Every day | ORAL | Status: DC

## 2011-06-30 MED ORDER — HYDROCODONE-ACETAMINOPHEN 10-500 MG PO TABS: 1.0000 | ORAL_TABLET | Freq: Four times a day (QID) | ORAL | Status: AC | PRN

## 2011-06-30 NOTE — Telephone Encounter (Signed)
Pt advised of Rx and change as well as Adderall. Rxs in cabinet for pt pick up.

## 2011-06-30 NOTE — Telephone Encounter (Signed)
Best I can do is #120 of hydrocodone 10-500 - ok to change back to the hydrocodone, and adderall as well  Done hardcopy to dahlia/LIM B

## 2011-06-30 NOTE — Telephone Encounter (Signed)
The oxycodone recently prescribed for the patient is not working. She is requesting to go back to Hydrocodone 10-325 and is requesting #140 to have enough with 4 refills. Also she needs a refill on her adderall. Call back number once complete is [phone]. Patient will pickup prescriptions.

## 2011-07-26 ENCOUNTER — Telehealth: Payer: Self-pay

## 2011-07-26 NOTE — Telephone Encounter (Signed)
Ok for verbal, let me know if hardcopy needed

## 2011-07-26 NOTE — Telephone Encounter (Signed)
Pt called requesting to have Rx for Hydrocodone changed from 10-500 to 10-325. Pt has 2 refills remaining at [facility] pharmacy ans is requesting we call pharmacy and advise.

## 2011-07-27 NOTE — Telephone Encounter (Signed)
Called [facility] informed pharmacist of MD's change in medication, no need for new hardcopy they made the change with a verbal. Called the patient left message on voice mail of information.

## 2011-08-03 ENCOUNTER — Other Ambulatory Visit: Payer: Self-pay

## 2011-08-03 DIAGNOSIS — M545 Low back pain, unspecified: Secondary | ICD-10-CM

## 2011-08-03 MED ORDER — PREDNISONE 10 MG PO TABS: ORAL_TABLET | ORAL | Status: DC

## 2011-09-07 ENCOUNTER — Encounter: Payer: Self-pay | Admitting: Internal Medicine

## 2011-09-07 ENCOUNTER — Ambulatory Visit (INDEPENDENT_AMBULATORY_CARE_PROVIDER_SITE_OTHER): Payer: Self-pay | Admitting: Internal Medicine

## 2011-09-07 VITALS — BP 120/78 | HR 96 | Temp 98.8°F | Ht 65.0 in | Wt 122.4 lb

## 2011-09-07 DIAGNOSIS — F411 Generalized anxiety disorder: Secondary | ICD-10-CM

## 2011-09-07 DIAGNOSIS — J449 Chronic obstructive pulmonary disease, unspecified: Secondary | ICD-10-CM

## 2011-09-07 DIAGNOSIS — Z23 Encounter for immunization: Secondary | ICD-10-CM

## 2011-09-07 DIAGNOSIS — M545 Low back pain, unspecified: Secondary | ICD-10-CM

## 2011-09-07 DIAGNOSIS — F988 Other specified behavioral and emotional disorders with onset usually occurring in childhood and adolescence: Secondary | ICD-10-CM

## 2011-09-07 MED ORDER — AMPHETAMINE-DEXTROAMPHETAMINE 30 MG PO TABS: 30.0000 mg | ORAL_TABLET | Freq: Every day | ORAL | Status: DC

## 2011-09-07 MED ORDER — FLUOXETINE HCL 20 MG PO CAPS: 20.0000 mg | ORAL_CAPSULE | Freq: Every day | ORAL | Status: DC

## 2011-09-07 MED ORDER — HYDROCODONE-ACETAMINOPHEN 10-325 MG PO TABS: 1.0000 | ORAL_TABLET | Freq: Four times a day (QID) | ORAL | Status: DC | PRN

## 2011-09-07 MED ORDER — ALPRAZOLAM 0.5 MG PO TABS: ORAL_TABLET | ORAL | Status: DC

## 2011-09-07 NOTE — Progress Notes (Signed)
Subjective:    Patient ID: Kathy Vargas, female    DOB: 05/27/1951, 60 y.o.   MRN: [id number]  HPI  Here to f/u; changing pain med last visit did not work out, now back on her prior pain med and overall stable,  Needs refill.  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  Has been very stressful experience for her during the time of being laid off, unemployment insurance runs out late dec this yr.  Denies worsening depressive symptoms, suicidal ideation, but has severe panic attacks worse in the past month;  currently takes her xanax at night, and adderall and pain med which all seem adequate for symptoms, but did not start the celexa after last visit b/c she thought it was for depression only. Has been on prozac in the past and tolerated for 9 mo, seemed to help.   Has been tremulous and wonders about parkinsons b/c her mother had this , but she has no other symptoms. Past Medical History  Diagnosis Date  . ABDOMINAL TENDERNESS 01/21/2011  . ACUTE GINGIVITIS NONPLAQUE INDUCED 06/11/2010  . ADD 09/05/2008  . Alcohol abuse, in remission 09/27/2007  . ALLERGIC RHINITIS 03/02/2009  . ANXIETY 07/02/2007  . BACK PAIN 05/19/2009  . CHRONIC OBSTRUCTIVE PULMONARY DISEASE, ACUTE EXACERBATION 08/06/2009  . COPD 07/02/2007  . DEGENERATIVE JOINT DISEASE, CERVICAL SPINE 07/02/2007  . DEPRESSION 07/02/2007  . GASTROENTERITIS, VIRAL 11/29/2010  . GERD 01/21/2011  . Headache 07/02/2007  . HYPOTHYROIDISM 07/02/2007  . OSTEOARTHRITIS 07/02/2007  . PAIN, CHRONIC, DUE TO TRAUMA 07/02/2007  . PELVIC  PAIN 03/29/2010  . Other and unspecified ovarian cyst 05/19/2009  . UTI 05/19/2009  . Vitamin D insufficiency   . Lumbar disc disease   . Lumbar pain with radiation down right leg 06/01/2011   Past Surgical History  Procedure Date  . Abdominal hysterectomy   . Cholecystectomy     reports that she has been smoking.  She does not have any  smokeless tobacco history on file. She reports that she does not drink alcohol or use illicit drugs. family history includes Alcohol abuse in her other; Anxiety disorder in her other; Coronary artery disease in her other; Depression in her other; and Stroke in her other. Allergies  Allergen Reactions  . Morphine    Current Outpatient Prescriptions on File Prior to Visit  Medication Sig Dispense Refill  . ALPRAZolam (XANAX) 0.5 MG tablet Take up to one and 1/2 tabs by mouth per day as needed for nerves  45 tablet  5  . amphetamine-dextroamphetamine (ADDERALL, 30MG ,) 30 MG tablet Take 1 tablet (30 mg total) by mouth daily.  30 tablet  0  . citalopram (CELEXA) 20 MG tablet Take 1 tablet (20 mg total) by mouth daily.  90 tablet  3   Review of Systems All otherwise neg per pt     Objective:   Physical Exam BP 120/78  Pulse 96  Temp(Src) 98.8 F (37.1 C) (Oral)  Ht 5\' 5"  (1.651 m)  Wt 122 lb 6.4 oz (55.52 kg)  BMI 20.37 kg/m2  SpO2 98% Physical Exam  VS noted Constitutional: Pt appears well-developed and well-nourished.  HENT: Head: Normocephalic.  Right Ear: External ear normal.  Left Ear: External ear normal.  Eyes: Conjunctivae and EOM are normal. Pupils are equal, round, and reactive to light.  Neck: Normal range of motion. Neck supple.  Cardiovascular: Normal rate and  regular rhythm.   Pulmonary/Chest: Effort normal and breath sounds normal.  Neurological: Pt is alert. No cranial nerve deficit. motor/dtr intact throughout Skin: Skin is warm. No erythema.  Psychiatric: Pt behavior is normal. Thought content normal. 2+ nervous, somewhat tremulous    Assessment & Plan:

## 2011-09-07 NOTE — Patient Instructions (Signed)
Take all new medications as prescribed Continue all other medications as before Please return in 6 months, or sooner if needed 

## 2011-09-07 NOTE — Assessment & Plan Note (Signed)
Chronic stable - refill pain med

## 2011-09-08 ENCOUNTER — Encounter: Payer: Self-pay | Admitting: Internal Medicine

## 2011-09-08 NOTE — Assessment & Plan Note (Signed)
Chronic stable, cont meds as is

## 2011-09-08 NOTE — Assessment & Plan Note (Signed)
To add the prozac back, Continue all other medications as before, declines counseling or referral to psychiatry at this time

## 2011-09-08 NOTE — Assessment & Plan Note (Signed)
Chronic stable, cont meds as is  SpO2 Readings from Last 3 Encounters:  09/07/11 98%  06/01/11 98%  01/21/11 98%

## 2011-11-29 ENCOUNTER — Ambulatory Visit: Payer: Self-pay | Admitting: Internal Medicine

## 2011-12-20 ENCOUNTER — Telehealth: Payer: Self-pay

## 2011-12-20 DIAGNOSIS — M545 Pain in right leg: Secondary | ICD-10-CM

## 2011-12-20 MED ORDER — HYDROCODONE-ACETAMINOPHEN 10-325 MG PO TABS: 1.0000 | ORAL_TABLET | Freq: Four times a day (QID) | ORAL | Status: DC | PRN

## 2011-12-20 MED ORDER — AMPHETAMINE-DEXTROAMPHETAMINE 30 MG PO TABS: 30.0000 mg | ORAL_TABLET | Freq: Every day | ORAL | Status: DC

## 2011-12-20 NOTE — Telephone Encounter (Signed)
Done hardcopy to robin  

## 2011-12-20 NOTE — Telephone Encounter (Signed)
Patient informed and will pickup prescriptions Jan. 30, 2013.

## 2011-12-20 NOTE — Telephone Encounter (Signed)
Patient requesting a 3 month refill on Adderall, Hydrocodone and new rx for prednisone (due to sciatic nerve pain). Please call when ready to pickup [phone].

## 2011-12-20 NOTE — Telephone Encounter (Signed)
Called patient left message to call back. The patient also requested a prescription for prednisone for sciatic nerve pain.

## 2011-12-21 ENCOUNTER — Telehealth: Payer: Self-pay

## 2011-12-21 MED ORDER — PREDNISONE 10 MG PO TABS: ORAL_TABLET | ORAL | Status: DC

## 2011-12-21 NOTE — Telephone Encounter (Signed)
Patient informed. 

## 2011-12-21 NOTE — Telephone Encounter (Signed)
Patient is requesting a prescription for prednisone for her sciatic nerve pain. States was given previously and did help. Please advise

## 2011-12-21 NOTE — Telephone Encounter (Signed)
Ok this time only; would need OV for further pain or worsening

## 2012-02-09 ENCOUNTER — Encounter: Payer: Self-pay | Admitting: Internal Medicine

## 2012-02-09 ENCOUNTER — Ambulatory Visit (INDEPENDENT_AMBULATORY_CARE_PROVIDER_SITE_OTHER): Payer: Self-pay | Admitting: Internal Medicine

## 2012-02-09 VITALS — BP 104/64 | HR 70 | Temp 98.4°F | Ht 64.5 in | Wt 127.4 lb

## 2012-02-09 DIAGNOSIS — N959 Unspecified menopausal and perimenopausal disorder: Secondary | ICD-10-CM | POA: Insufficient documentation

## 2012-02-09 DIAGNOSIS — M545 Low back pain, unspecified: Secondary | ICD-10-CM

## 2012-02-09 DIAGNOSIS — F988 Other specified behavioral and emotional disorders with onset usually occurring in childhood and adolescence: Secondary | ICD-10-CM

## 2012-02-09 DIAGNOSIS — F411 Generalized anxiety disorder: Secondary | ICD-10-CM

## 2012-02-09 MED ORDER — ALPRAZOLAM 0.5 MG PO TABS: ORAL_TABLET | ORAL | Status: DC

## 2012-02-09 MED ORDER — CYCLOBENZAPRINE HCL 5 MG PO TABS: 5.0000 mg | ORAL_TABLET | Freq: Three times a day (TID) | ORAL | Status: DC | PRN

## 2012-02-09 MED ORDER — METHYLPREDNISOLONE ACETATE 80 MG/ML IJ SUSP
120.0000 mg | Freq: Once | INTRAMUSCULAR | Status: AC
  Administered 2012-02-09: 120 mg via INTRAMUSCULAR

## 2012-02-09 MED ORDER — ESTRADIOL 1 MG PO TABS: 1.0000 mg | ORAL_TABLET | Freq: Every day | ORAL | Status: DC

## 2012-02-09 MED ORDER — CITALOPRAM HYDROBROMIDE 10 MG PO TABS: 10.0000 mg | ORAL_TABLET | Freq: Every day | ORAL | Status: DC

## 2012-02-09 MED ORDER — PREDNISONE 10 MG PO TABS: ORAL_TABLET | ORAL | Status: DC

## 2012-02-09 MED ORDER — HYDROCODONE-ACETAMINOPHEN 10-325 MG PO TABS: 1.0000 | ORAL_TABLET | Freq: Four times a day (QID) | ORAL | Status: DC | PRN

## 2012-02-09 MED ORDER — AMPHETAMINE-DEXTROAMPHETAMINE 30 MG PO TABS: 30.0000 mg | ORAL_TABLET | Freq: Every day | ORAL | Status: DC

## 2012-02-09 NOTE — Assessment & Plan Note (Signed)
Ok for trial estradiol, much less expensive, limited tx only to 1 yr or less as d/w pt

## 2012-02-09 NOTE — Assessment & Plan Note (Signed)
With some symptoms improvement since onset x 4 wks but still signficant, for pain med, flexeril trial, and predpack trial; ideally should consider MRI but she declines at this time;  I suspect at least one or more level lumbar nerve root irritation in the setting of probable lumbar DJD/DDD

## 2012-02-09 NOTE — Patient Instructions (Signed)
Take all new medications as prescribed Continue all other medications as before  

## 2012-02-09 NOTE — Assessment & Plan Note (Signed)
Intol of prozac, for citalopram 10 qd, cont xanax prn, delines counseling,  to f/u any worsening symptoms or concerns

## 2012-02-09 NOTE — Progress Notes (Signed)
Subjective:    Patient ID: Kathy Vargas, female    DOB: Oct 27, 1951, 61 y.o.   MRN: [id number]  HPI  Here to f/u;  Pt c/o right LBP, mod to severe x 4 wks, constant with better and worse pain, starting with simply bending at the waist to tie the shoe, initial pain very severe and radiated to the right foot (and at the time assoc with episode of severe nausea and what sounds like vasovagal syncope) , but for last 2 wks or so only radaites to the right groin adn sometimes to the right knee, without bowel or bladder change, fever, wt loss,  worsening LE pain/numbness/weakness, gait change or falls.  Back pain has been a recurring problem since may 2012 . Also mentions stopped her prozac after 2 wks due to increased agitation, more depressed.  Has had worsening depressive symptoms, but no suicidal ideation, or panic, though has ongoing anxiety, some increaed recently as well. ADD med still working well, needs mult refills today.  Also post menopausal, asks for premarin but cash pay price > $100, has ongoing dryness, mood swings s/p TSH, ovaries remain.   Past Medical History  Diagnosis Date  . ABDOMINAL TENDERNESS 01/21/2011  . ACUTE GINGIVITIS NONPLAQUE INDUCED 06/11/2010  . ADD 09/05/2008  . Alcohol abuse, in remission 09/27/2007  . ALLERGIC RHINITIS 03/02/2009  . ANXIETY 07/02/2007  . BACK PAIN 05/19/2009  . CHRONIC OBSTRUCTIVE PULMONARY DISEASE, ACUTE EXACERBATION 08/06/2009  . COPD 07/02/2007  . DEGENERATIVE JOINT DISEASE, CERVICAL SPINE 07/02/2007  . DEPRESSION 07/02/2007  . GASTROENTERITIS, VIRAL 11/29/2010  . GERD 01/21/2011  . Headache 07/02/2007  . HYPOTHYROIDISM 07/02/2007  . OSTEOARTHRITIS 07/02/2007  . PAIN, CHRONIC, DUE TO TRAUMA 07/02/2007  . PELVIC  PAIN 03/29/2010  . Other and unspecified ovarian cyst 05/19/2009  . UTI 05/19/2009  . Vitamin D insufficiency   . Lumbar disc disease   . Lumbar pain with radiation down right leg 06/01/2011   Past Surgical History  Procedure Date  . Abdominal  hysterectomy   . Cholecystectomy     reports that she has been smoking.  She does not have any smokeless tobacco history on file. She reports that she does not drink alcohol or use illicit drugs. family history includes Alcohol abuse in her other; Anxiety disorder in her other; Coronary artery disease in her other; Depression in her other; and Stroke in her other. Allergies  Allergen Reactions  . Morphine    No current outpatient prescriptions on file prior to visit.   No current facility-administered medications on file prior to visit.   Review of Systems Review of Systems  Constitutional: Negative for diaphoresis and unexpected weight change.  HENT: Negative for drooling and tinnitus.   Eyes: Negative for photophobia and visual disturbance.  Respiratory: Negative for choking and stridor.   Gastrointestinal: Negative for vomiting and blood in stool.  Genitourinary: Negative for hematuria and decreased urine volume.  Musculoskeletal: Negative for gait problem.       Objective:   Physical Exam BP 104/64  Pulse 70  Temp(Src) 98.4 F (36.9 C) (Oral)  Ht 5' 4.5" (1.638 m)  Wt 127 lb 6 oz (57.777 kg)  BMI 21.53 kg/m2  SpO2 98% Physical Exam  VS noted Constitutional: Pt appears well-developed and well-nourished.  HENT: Head: Normocephalic.  Right Ear: External ear normal.  Left Ear: External ear normal.  Eyes: Conjunctivae and EOM are normal. Pupils are equal, round, and reactive to light.  Neck: Normal range of motion. Neck supple.  Cardiovascular: Normal rate and regular rhythm.   Pulmonary/Chest: Effort normal and breath sounds normal.  Abd:  Soft, NT, non-distended, + BS Spine nontender;  Has bilat paravertebral tender/spasm,  Also tender over right SI joint area Neurological: Pt is alert. No cranial nerve deficit. motor/sens/dtr/gait intact, neg slr on right Skin: Skin is warm. No erythema. No rash Psychiatric: Pt behavior is normal. Thought content normal. 1-2+ nervous,  mild depressed affect    Assessment & Plan:

## 2012-02-09 NOTE — Assessment & Plan Note (Signed)
stable overall by hx and exam, , and pt to continue medical treatment as before   

## 2012-02-09 NOTE — Assessment & Plan Note (Signed)
With some symptoms improvement since onset x 4 wks but still signficant, for pain med, flexeril trial, and predpack trial; ideally should consider MRI but she declines at this time;  I suspect at least one or more level lumbar nerve root irritation in the setting of probable lumbar DJD/DDD 

## 2012-02-24 ENCOUNTER — Ambulatory Visit: Payer: Self-pay | Admitting: Internal Medicine

## 2012-05-15 ENCOUNTER — Ambulatory Visit (INDEPENDENT_AMBULATORY_CARE_PROVIDER_SITE_OTHER): Payer: Self-pay | Admitting: Internal Medicine

## 2012-05-15 ENCOUNTER — Encounter: Payer: Self-pay | Admitting: Internal Medicine

## 2012-05-15 VITALS — BP 114/72 | HR 76 | Temp 97.5°F | Ht 64.0 in | Wt 126.2 lb

## 2012-05-15 DIAGNOSIS — M545 Low back pain, unspecified: Secondary | ICD-10-CM

## 2012-05-15 MED ORDER — HYDROCODONE-ACETAMINOPHEN 10-325 MG PO TABS: 1.0000 | ORAL_TABLET | Freq: Four times a day (QID) | ORAL | Status: DC | PRN

## 2012-05-15 MED ORDER — AMPHETAMINE-DEXTROAMPHETAMINE 30 MG PO TABS: 30.0000 mg | ORAL_TABLET | Freq: Every day | ORAL | Status: DC

## 2012-05-15 MED ORDER — METHYLPREDNISOLONE ACETATE 80 MG/ML IJ SUSP
120.0000 mg | Freq: Once | INTRAMUSCULAR | Status: AC
  Administered 2012-05-15: 120 mg via INTRAMUSCULAR

## 2012-05-15 MED ORDER — PREDNISONE 10 MG PO TABS: ORAL_TABLET | ORAL | Status: DC

## 2012-05-15 MED ORDER — ALPRAZOLAM 0.5 MG PO TABS: ORAL_TABLET | ORAL | Status: DC

## 2012-05-15 MED ORDER — CYCLOBENZAPRINE HCL 5 MG PO TABS: 5.0000 mg | ORAL_TABLET | Freq: Three times a day (TID) | ORAL | Status: AC | PRN

## 2012-05-15 NOTE — Patient Instructions (Addendum)
You had the steroid shot today Continue all other medications as before You are given all the refills today as requested Please have the pharmacy call with any other refills you may need. Please return in 6 months, or sooner if needed

## 2012-05-15 NOTE — Progress Notes (Signed)
Subjective:    Patient ID: Kathy Vargas, female    DOB: 1950-11-24, 61 y.o.   MRN: [id number]  HPI  Here to f/u;  Denies worsening depressive symptoms, suicidal ideation, or panic, though has ongoing anxiety, not increased recently.  In fact feels quite goo overall and states the estradiol seemed to have resolved her emotional symptoms recently.    Pt continues to have recurring LBP without change in severity but a bit more freq now on the RLE with ongoing mild weakness, but no bowel or bladder change, fever, wt loss,  worsening LE numbness, gait change or falls.  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  ADD med also working well, able to keep up at work, and hoping to become permanently employed after sept 2013 with benefits.  No longer taking the celexa.  Past Medical History  Diagnosis Date  . ABDOMINAL TENDERNESS 01/21/2011  . ACUTE GINGIVITIS NONPLAQUE INDUCED 06/11/2010  . ADD 09/05/2008  . Alcohol abuse, in remission 09/27/2007  . ALLERGIC RHINITIS 03/02/2009  . ANXIETY 07/02/2007  . BACK PAIN 05/19/2009  . CHRONIC OBSTRUCTIVE PULMONARY DISEASE, ACUTE EXACERBATION 08/06/2009  . COPD 07/02/2007  . DEGENERATIVE JOINT DISEASE, CERVICAL SPINE 07/02/2007  . DEPRESSION 07/02/2007  . GASTROENTERITIS, VIRAL 11/29/2010  . GERD 01/21/2011  . Headache 07/02/2007  . HYPOTHYROIDISM 07/02/2007  . OSTEOARTHRITIS 07/02/2007  . PAIN, CHRONIC, DUE TO TRAUMA 07/02/2007  . PELVIC  PAIN 03/29/2010  . Other and unspecified ovarian cyst 05/19/2009  . UTI 05/19/2009  . Vitamin D insufficiency   . Lumbar disc disease   . Lumbar pain with radiation down right leg 06/01/2011   Past Surgical History  Procedure Date  . Abdominal hysterectomy   . Cholecystectomy     reports that she has been smoking.  She does not have any smokeless tobacco history on file. She reports that she does not drink alcohol or use illicit drugs. family history includes Alcohol abuse in her other; Anxiety  disorder in her other; Coronary artery disease in her other; Depression in her other; and Stroke in her other. Allergies  Allergen Reactions  . Morphine    Current Outpatient Prescriptions on File Prior to Visit  Medication Sig Dispense Refill  . estradiol (ESTRACE) 1 MG tablet Take 1 tablet (1 mg total) by mouth daily.  90 tablet  3  . DISCONTD: amphetamine-dextroamphetamine (ADDERALL, 30MG ,) 30 MG tablet Take 1 tablet (30 mg total) by mouth daily. To fill Feb 18, 2012  30 tablet  0  . citalopram (CELEXA) 10 MG tablet Take 1 tablet (10 mg total) by mouth daily.  90 tablet  3  . DISCONTD: FLUoxetine (PROZAC) 20 MG capsule Take 1 capsule (20 mg total) by mouth daily.  30 capsule  11   No current facility-administered medications on file prior to visit.   Review of Systems All otherwise neg per pt     Objective:   Physical Exam BP 114/72  Pulse 76  Temp 97.5 F (36.4 C) (Oral)  Ht 5\' 4"  (1.626 m)  Wt 126 lb 4 oz (57.267 kg)  BMI 21.67 kg/m2  SpO2 97% Physical Exam  VS noted Constitutional: Pt appears well-developed and well-nourished.  HENT: Head: Normocephalic.  Right Ear: External ear normal.  Left Ear: External ear normal.  Eyes: Conjunctivae and EOM are normal. Pupils are equal, round, and reactive to light.  Neck: Normal range of motion. Neck supple.  Cardiovascular: Normal rate and regular rhythm.  Pulmonary/Chest: Effort normal and breath sounds normal.  Abd:  Soft, NT, non-distended, + BS Neurological: Pt is alert. Not confused, motor 4+/5 RLE, with intact dtr/sens to LT  Skin: Skin is warm. No erythema.  Psychiatric: Pt behavior is normal. Thought content normal.  Spine nontender    Assessment & Plan:

## 2012-05-15 NOTE — Assessment & Plan Note (Signed)
With mild to mod flare, for depomedrol IM as seemed to help so well last visit, predpack asd, pain med refill, muscle relaxer trial,  to f/u any worsening symptoms or concerns

## 2012-08-09 ENCOUNTER — Ambulatory Visit: Payer: Self-pay | Admitting: Internal Medicine

## 2012-10-11 ENCOUNTER — Encounter: Payer: Self-pay | Admitting: Internal Medicine

## 2012-10-11 ENCOUNTER — Ambulatory Visit (INDEPENDENT_AMBULATORY_CARE_PROVIDER_SITE_OTHER): Payer: 59 | Admitting: Internal Medicine

## 2012-10-11 VITALS — BP 118/74 | HR 66 | Temp 97.4°F | Ht 64.5 in | Wt 123.0 lb

## 2012-10-11 DIAGNOSIS — J209 Acute bronchitis, unspecified: Secondary | ICD-10-CM

## 2012-10-11 DIAGNOSIS — J441 Chronic obstructive pulmonary disease with (acute) exacerbation: Secondary | ICD-10-CM

## 2012-10-11 DIAGNOSIS — F988 Other specified behavioral and emotional disorders with onset usually occurring in childhood and adolescence: Secondary | ICD-10-CM

## 2012-10-11 DIAGNOSIS — G8921 Chronic pain due to trauma: Secondary | ICD-10-CM

## 2012-10-11 DIAGNOSIS — M545 Low back pain, unspecified: Secondary | ICD-10-CM

## 2012-10-11 DIAGNOSIS — Z23 Encounter for immunization: Secondary | ICD-10-CM

## 2012-10-11 DIAGNOSIS — Z0000 Encounter for general adult medical examination without abnormal findings: Secondary | ICD-10-CM

## 2012-10-11 MED ORDER — AMPHETAMINE-DEXTROAMPHETAMINE 30 MG PO TABS: ORAL_TABLET | ORAL | Status: DC

## 2012-10-11 MED ORDER — HYDROCODONE-ACETAMINOPHEN 10-325 MG PO TABS: 1.0000 | ORAL_TABLET | Freq: Four times a day (QID) | ORAL | Status: DC | PRN

## 2012-10-11 MED ORDER — BENZONATATE 100 MG PO CAPS: ORAL_CAPSULE | ORAL | Status: DC

## 2012-10-11 MED ORDER — PREDNISONE 10 MG PO TABS: ORAL_TABLET | ORAL | Status: DC

## 2012-10-11 MED ORDER — METHYLPREDNISOLONE ACETATE 80 MG/ML IJ SUSP
120.0000 mg | Freq: Once | INTRAMUSCULAR | Status: AC
  Administered 2012-10-11: 120 mg via INTRAMUSCULAR

## 2012-10-11 MED ORDER — AZITHROMYCIN 250 MG PO TABS: ORAL_TABLET | ORAL | Status: DC

## 2012-10-11 MED ORDER — ESTRADIOL 1 MG PO TABS: 1.0000 mg | ORAL_TABLET | Freq: Every day | ORAL | Status: DC

## 2012-10-11 MED ORDER — ALPRAZOLAM 0.5 MG PO TABS: ORAL_TABLET | ORAL | Status: DC

## 2012-10-11 NOTE — Patient Instructions (Addendum)
Your right ear was irrigated of wax today You had the steroid shot today Take all new medications as prescribed - the antibiotic, cough medicine, and prednisone OK to increase the adderall as we discussed, and you are given the 3 months prescriptions today Continue all other medications as before You are given all the refills today as requested Please return in 6 mo with Lab testing done 3-5 days before (the office will call)

## 2012-10-11 NOTE — Assessment & Plan Note (Signed)
Exam without neuro change, for med refill,  to f/u any worsening symptoms or concerns

## 2012-10-11 NOTE — Assessment & Plan Note (Signed)
Mild to mod, for predpack asd,  to f/u any worsening symptoms or concerns 

## 2012-10-11 NOTE — Assessment & Plan Note (Signed)
Ok to adjust adderall - 30 mg in am, 15 in PM,  to f/u any worsening symptoms or concerns

## 2012-10-11 NOTE — Progress Notes (Signed)
Subjective:    Patient ID: Kathy Vargas, female    DOB: [birth year], 61 y.o.   MRN: [id number]  HPI  Here with acute onset mild to mod 2-3 days ST, HA, general weakness and malaise, with prod cough greenish sputum, but Pt denies chest pain, increased sob or doe, wheezing, orthopnea, PND, increased LE swelling, palpitations, dizziness or syncopel except for onset wheezing last evening.   Pt denies polydipsia, polyuria,. Pt denies new neurological symptoms such as new headache, or facial or extremity weakness or numbness  Pt continues to have recurring LBP without change in severity but now with mild sciatica liek pain to the left (not right as has been in the past), but no bowel or bladder change, fever, wt loss,  worsening LE /numbness/weakness, gait change or falls.  Denies worsening depressive symptoms, suicidal ideation, or panic, though has ongoing anxiety, not increased recently.   Working again fortunately, but having some increased difficulty with her present position to focus later in the day adn complete tasks Past Medical History  Diagnosis Date  . ABDOMINAL TENDERNESS 01/21/2011  . ACUTE GINGIVITIS NONPLAQUE INDUCED 06/11/2010  . ADD 09/05/2008  . Alcohol abuse, in remission 09/27/2007  . ALLERGIC RHINITIS 03/02/2009  . ANXIETY 07/02/2007  . BACK PAIN 05/19/2009  . CHRONIC OBSTRUCTIVE PULMONARY DISEASE, ACUTE EXACERBATION 08/06/2009  . COPD 07/02/2007  . DEGENERATIVE JOINT DISEASE, CERVICAL SPINE 07/02/2007  . DEPRESSION 07/02/2007  . GASTROENTERITIS, VIRAL 11/29/2010  . GERD 01/21/2011  . Headache 07/02/2007  . HYPOTHYROIDISM 07/02/2007  . OSTEOARTHRITIS 07/02/2007  . PAIN, CHRONIC, DUE TO TRAUMA 07/02/2007  . PELVIC  PAIN 03/29/2010  . Other and unspecified ovarian cyst 05/19/2009  . UTI 05/19/2009  . Vitamin D insufficiency   . Lumbar disc disease   . Lumbar pain with radiation down right leg 06/01/2011   Past Surgical History  Procedure Date  . Abdominal hysterectomy   . Cholecystectomy       reports that she has been smoking.  She does not have any smokeless tobacco history on file. She reports that she does not drink alcohol or use illicit drugs. family history includes Alcohol abuse in her other; Anxiety disorder in her other; Coronary artery disease in her other; Depression in her other; and Stroke in her other. Allergies  Allergen Reactions  . Morphine    Current Outpatient Prescriptions on File Prior to Visit  Medication Sig Dispense Refill  . [DISCONTINUED] amphetamine-dextroamphetamine (ADDERALL) 30 MG tablet Take 1 tablet (30 mg total) by mouth daily. To fill Jul 14, 2012  30 tablet  0  . [DISCONTINUED] estradiol (ESTRACE) 1 MG tablet Take 1 tablet (1 mg total) by mouth daily.  90 tablet  3  . [DISCONTINUED] FLUoxetine (PROZAC) 20 MG capsule Take 1 capsule (20 mg total) by mouth daily.  30 capsule  11   No current facility-administered medications on file prior to visit.   Review of Systems  Constitutional: Negative for diaphoresis and unexpected weight change.  HENT: Negative for tinnitus.   Eyes: Negative for photophobia and visual disturbance.  Respiratory: Negative for choking and stridor.   Gastrointestinal: Negative for vomiting and blood in stool.  Genitourinary: Negative for hematuria and decreased urine volume.  Musculoskeletal: Negative for gait problem.  Skin: Negative for color change and wound.  Neurological: Negative for tremors and numbness.  Psychiatric/Behavioral: patient is not hyperactive.       Objective:   Physical Exam BP 118/74  Pulse 66  Temp 97.4 F (36.3 C) (Oral)  Ht 5' 4.5" (1.638 m)  Wt 123 lb (55.792 kg)  BMI 20.79 kg/m2  SpO2 99% Physical Exam  VS noted, mild ill Constitutional: Pt appears well-developed and well-nourished.  HENT: Head: Normocephalic.  Right Ear: External ear normal.  Left Ear: External ear normal.  Bilat tm's mild erythema.  Sinus nontender.  Pharynx mild erythema Eyes: Conjunctivae and EOM are  normal. Pupils are equal, round, and reactive to light.  Neck: Normal range of motion. Neck supple.  Cardiovascular: Normal rate and regular rhythm.   Pulmonary/Chest: Effort normal and breath sounds decreased with few wheeze.  Neurological: Pt is alert. Not confused , motor/gait intact Skin: Skin is warm. No erythema.  Psychiatric: Pt behavior is normal. Thought content normal.     Assessment & Plan:

## 2012-10-11 NOTE — Assessment & Plan Note (Signed)
Mild to mod, for antibx course,  to f/u any worsening symptoms or concerns 

## 2012-11-28 ENCOUNTER — Telehealth: Payer: Self-pay | Admitting: Internal Medicine

## 2012-11-28 NOTE — Telephone Encounter (Signed)
Message copied by Etheleen Sia on Wed Nov 28, 2012 10:13 AM ------      Message from: Etheleen Sia      Created: Wed Nov 28, 2012 10:00 AM      Regarding: FW: needs CPX 6 mo       Pt has not responded.      ----- Message -----         From: Etheleen Sia         Sent: 11/23/2012  11:24 AM           To: Etheleen Sia      Subject: FW: needs CPX 6 mo                                       lmom 1-3      ----- Message -----         From: Etheleen Sia         Sent: 11/08/2012  10:57 AM           To: Etheleen Sia      Subject: FW: needs CPX 6 mo                                       LMOM TO CALL  12/19      ----- Message -----         From: Corwin Levins, MD         Sent: 10/11/2012   4:57 PM           To: Etheleen Sia      Subject: needs CPX 6 mo                                           To call pt

## 2012-12-10 ENCOUNTER — Telehealth: Payer: Self-pay

## 2012-12-10 NOTE — Telephone Encounter (Signed)
Received PA approval for patients adderall from now until 12/06/13.  Called the patient left detailed message of approval and faxed approval letter to pharmacy ([facility]).  Put copy of approval letter in [facility] PA folder.

## 2013-02-11 ENCOUNTER — Other Ambulatory Visit: Payer: Self-pay

## 2013-02-11 MED ORDER — AMPHETAMINE-DEXTROAMPHETAMINE 30 MG PO TABS: ORAL_TABLET | ORAL | Status: DC

## 2013-02-11 NOTE — Telephone Encounter (Signed)
Done hardcopy to robin  

## 2013-02-11 NOTE — Telephone Encounter (Signed)
Called the patient left detailed message that hardcopy is ready for pickup at the front desk. 

## 2013-02-18 ENCOUNTER — Other Ambulatory Visit (INDEPENDENT_AMBULATORY_CARE_PROVIDER_SITE_OTHER): Payer: 59

## 2013-02-18 DIAGNOSIS — Z0000 Encounter for general adult medical examination without abnormal findings: Secondary | ICD-10-CM

## 2013-02-18 LAB — URINALYSIS, ROUTINE W REFLEX MICROSCOPIC
Bilirubin Urine: NEGATIVE
Hgb urine dipstick: NEGATIVE
Leukocytes, UA: NEGATIVE
Nitrite: NEGATIVE
Total Protein, Urine: NEGATIVE
Urobilinogen, UA: 0.2 (ref 0.0–1.0)

## 2013-02-18 LAB — HEPATIC FUNCTION PANEL
ALT: 15 U/L (ref 0–35)
AST: 20 U/L (ref 0–37)
Bilirubin, Direct: 0.1 mg/dL (ref 0.0–0.3)
Total Protein: 6.7 g/dL (ref 6.0–8.3)

## 2013-02-18 LAB — BASIC METABOLIC PANEL
BUN: 14 mg/dL (ref 6–23)
CO2: 27 mEq/L (ref 19–32)
Chloride: 102 mEq/L (ref 96–112)
Creatinine, Ser: 0.7 mg/dL (ref 0.4–1.2)

## 2013-02-18 LAB — CBC WITH DIFFERENTIAL/PLATELET
Basophils Absolute: 0 10*3/uL (ref 0.0–0.1)
Basophils Relative: 0.5 % (ref 0.0–3.0)
Eosinophils Absolute: 0.2 10*3/uL (ref 0.0–0.7)
Lymphocytes Relative: 34.9 % (ref 12.0–46.0)
MCHC: 33.6 g/dL (ref 30.0–36.0)
Neutrophils Relative %: 54.5 % (ref 43.0–77.0)
Platelets: 295 10*3/uL (ref 150.0–400.0)
RBC: 4.07 Mil/uL (ref 3.87–5.11)
WBC: 7.1 10*3/uL (ref 4.5–10.5)

## 2013-02-18 LAB — LIPID PANEL
Cholesterol: 191 mg/dL (ref 0–200)
Triglycerides: 134 mg/dL (ref 0.0–149.0)

## 2013-02-18 LAB — TSH: TSH: 1.23 u[IU]/mL (ref 0.35–5.50)

## 2013-02-20 ENCOUNTER — Ambulatory Visit (INDEPENDENT_AMBULATORY_CARE_PROVIDER_SITE_OTHER): Payer: 59 | Admitting: Internal Medicine

## 2013-02-20 ENCOUNTER — Encounter: Payer: Self-pay | Admitting: Internal Medicine

## 2013-02-20 VITALS — BP 114/68 | HR 76 | Temp 98.1°F | Ht 64.5 in | Wt 118.5 lb

## 2013-02-20 DIAGNOSIS — Z0000 Encounter for general adult medical examination without abnormal findings: Secondary | ICD-10-CM

## 2013-02-20 DIAGNOSIS — M545 Pain in right leg: Secondary | ICD-10-CM

## 2013-02-20 DIAGNOSIS — J309 Allergic rhinitis, unspecified: Secondary | ICD-10-CM

## 2013-02-20 MED ORDER — AMPHETAMINE-DEXTROAMPHETAMINE 30 MG PO TABS: ORAL_TABLET | ORAL | Status: DC

## 2013-02-20 MED ORDER — METHYLPREDNISOLONE ACETATE 80 MG/ML IJ SUSP
80.0000 mg | Freq: Once | INTRAMUSCULAR | Status: AC
  Administered 2013-02-20: 80 mg via INTRAMUSCULAR

## 2013-02-20 MED ORDER — PREDNISONE 10 MG PO TABS: ORAL_TABLET | ORAL | Status: DC

## 2013-02-20 MED ORDER — ALPRAZOLAM 0.5 MG PO TABS: ORAL_TABLET | ORAL | Status: DC

## 2013-02-20 MED ORDER — HYDROCODONE-ACETAMINOPHEN 10-325 MG PO TABS: 1.0000 | ORAL_TABLET | Freq: Four times a day (QID) | ORAL | Status: DC | PRN

## 2013-02-20 NOTE — Patient Instructions (Addendum)
Please take all new medication as prescribed - the prednisone You had the steroid shot today as well Please continue all other medications as before, and refills have been done if requested. Please have the pharmacy call with any other refills you may need. Please remember to followup with your GYN for the yearly pap smear and/or mammogram Please stop smoking Please continue your efforts at being more active, low cholesterol diet, and weight control. Please call in 1-2 weeks to see if we have the shingles shot, and come for Nurse Visit for the shot You are otherwise up to date with prevention measures today. Please keep your appointments with your specialists as you may have planned Please remember to sign up for My Chart if you have not done so, as this will be important to you in the future with finding out test results, communicating by private email, and scheduling acute appointments online when needed. Please return in 6 months, or sooner if needed

## 2013-02-20 NOTE — Progress Notes (Signed)
Subjective:    Patient ID: Kathy Vargas, female    DOB: 06/07/1951, 62 y.o.   MRN: [id number]  HPI  Here for wellness and f/u;  Overall doing ok;  Pt denies CP, worsening SOB, DOE, wheezing, orthopnea, PND, worsening LE edema, palpitations, dizziness or syncope.  Pt denies neurological change such as new headache, facial or extremity weakness.  Pt denies polydipsia, polyuria, or low sugar symptoms. Pt states overall good compliance with treatment and medications, good tolerability, and has been trying to follow lower cholesterol diet.  Pt denies worsening depressive symptoms, suicidal ideation or panic. No fever, night sweats, wt loss, loss of appetite, or other constitutional symptoms.  Pt states good ability with ADL's, has low fall risk, home safety reviewed and adequate, no other significant changes in hearing or vision, and only occasionally active with exercise.  Works Education officer, environmental a building, a co-worker with recent meningitis, worried abou there risk.  Pt denies fever, wt loss, night sweats, loss of appetite, or other constitutional symptoms.Does mention raw feeling, burning, itching; Does have several wks ongoing nasal allergy symptoms with clearish congestion, itch and sneezing, without fever, pain. Past Medical History  Diagnosis Date  . ABDOMINAL TENDERNESS 01/21/2011  . ACUTE GINGIVITIS NONPLAQUE INDUCED 06/11/2010  . ADD 09/05/2008  . Alcohol abuse, in remission 09/27/2007  . ALLERGIC RHINITIS 03/02/2009  . ANXIETY 07/02/2007  . BACK PAIN 05/19/2009  . CHRONIC OBSTRUCTIVE PULMONARY DISEASE, ACUTE EXACERBATION 08/06/2009  . COPD 07/02/2007  . DEGENERATIVE JOINT DISEASE, CERVICAL SPINE 07/02/2007  . DEPRESSION 07/02/2007  . GASTROENTERITIS, VIRAL 11/29/2010  . GERD 01/21/2011  . Headache 07/02/2007  . HYPOTHYROIDISM 07/02/2007  . OSTEOARTHRITIS 07/02/2007  . PAIN, CHRONIC, DUE TO TRAUMA 07/02/2007  . PELVIC  PAIN 03/29/2010  . Other and unspecified ovarian cyst 05/19/2009  . UTI 05/19/2009  . Vitamin  D insufficiency   . Lumbar disc disease   . Lumbar pain with radiation down right leg 06/01/2011   Past Surgical History  Procedure Laterality Date  . Abdominal hysterectomy    . Cholecystectomy      reports that she has been smoking.  She does not have any smokeless tobacco history on file. She reports that she does not drink alcohol or use illicit drugs. family history includes Alcohol abuse in her other; Anxiety disorder in her other; Coronary artery disease in her other; Depression in her other; and Stroke in her other. Allergies  Allergen Reactions  . Morphine    Current Outpatient Prescriptions on File Prior to Visit  Medication Sig Dispense Refill  . ALPRAZolam (XANAX) 0.5 MG tablet Take up to one and 1/2 tabs by mouth per day as needed for nerves;  45 tablet  5  . amphetamine-dextroamphetamine (ADDERALL) 30 MG tablet 1 tab by mouth in the AM, and 1/2 tab in the PM -  45 tablet  0  . estradiol (ESTRACE) 1 MG tablet Take 1 tablet (1 mg total) by mouth daily.  90 tablet  3  . HYDROcodone-acetaminophen (NORCO) 10-325 MG per tablet Take 1-2 tablets by mouth every 6 (six) hours as needed.  120 tablet  5  . [DISCONTINUED] FLUoxetine (PROZAC) 20 MG capsule Take 1 capsule (20 mg total) by mouth daily.  30 capsule  11   No current facility-administered medications on file prior to visit.   Review of Systems Constitutional: Negative for diaphoresis, activity change, appetite change or unexpected weight change.  HENT: Negative for hearing loss, ear pain, facial swelling, mouth sores and neck stiffness.  Eyes: Negative for pain, redness and visual disturbance.  Respiratory: Negative for shortness of breath and wheezing.   Cardiovascular: Negative for chest pain and palpitations.  Gastrointestinal: Negative for diarrhea, blood in stool, abdominal distention or other pain Genitourinary: Negative for hematuria, flank pain or change in urine volume.  Musculoskeletal: Negative for myalgias and  joint swelling.  Skin: Negative for color change and wound.  Neurological: Negative for syncope and numbness. other than noted Hematological: Negative for adenopathy.  Psychiatric/Behavioral: Negative for hallucinations, self-injury, decreased concentration and agitation.      Objective:   Physical Exam BP 114/68  Pulse 76  Temp(Src) 98.1 F (36.7 C) (Oral)  Ht 5' 4.5" (1.638 m)  Wt 118 lb 8 oz (53.751 kg)  BMI 20.03 kg/m2  SpO2 98% VS noted,  Constitutional: Pt is oriented to person, place, and time. Appears well-developed and well-nourished.  Head: Normocephalic and atraumatic.  Right Ear: External ear normal.  Left Ear: External ear normal.  Nose: Nose normal.  Mouth/Throat: Oropharynx is clear and moist.  Eyes: Conjunctivae with bilat erythema, weepiness. Pupils are equal, round, and reactive to light.  Bilat tm's with mild erythema.  Max sinus areas non tender.  Pharynx with mild erythema, no exudate Neck: Normal range of motion. Neck supple. No JVD present. No tracheal deviation present.  Cardiovascular: Normal rate, regular rhythm, normal heart sounds and intact distal pulses.   Pulmonary/Chest: Effort normal and breath sounds decreased bilat.  Abdominal: Soft. Bowel sounds are normal. There is no tenderness. No HSM  Musculoskeletal: Normal range of motion. Exhibits no edema.  Lymphadenopathy:  Has no cervical adenopathy.  Neurological: Pt is alert and oriented to person, place, and time. Pt has normal reflexes. No cranial nerve deficit.  Skin: Skin is warm and dry. No rash noted.  Psychiatric:  Mild nervous. Behavior is normal.     Assessment & Plan:

## 2013-02-20 NOTE — Assessment & Plan Note (Addendum)
Mild to mod, for depomedrol IM,  to f/u any worsening symptoms or concerns, for predpack as wel

## 2013-02-20 NOTE — Assessment & Plan Note (Signed)

## 2013-02-22 ENCOUNTER — Ambulatory Visit: Payer: 59 | Admitting: Internal Medicine

## 2013-02-26 ENCOUNTER — Ambulatory Visit: Payer: 59 | Admitting: Internal Medicine

## 2013-03-01 ENCOUNTER — Telehealth: Payer: Self-pay | Admitting: Internal Medicine

## 2013-03-01 MED ORDER — AZITHROMYCIN 250 MG PO TABS: ORAL_TABLET | ORAL | Status: DC

## 2013-03-01 MED ORDER — PREDNISONE 10 MG PO TABS: ORAL_TABLET | ORAL | Status: DC

## 2013-03-01 NOTE — Telephone Encounter (Signed)
Called the patient left detailed message that prednisone and antibiotic sent to her pharmacy as requested

## 2013-03-01 NOTE — Telephone Encounter (Signed)
Both done erx 

## 2013-03-01 NOTE — Telephone Encounter (Signed)
Patient Information:  Caller Name: Geeta  Phone: [phone]  Patient: Kathy Vargas, Kathy Vargas  Gender: Female  DOB: Sep 12, 1951  Age: 62 Years  PCP: Oliver Barre (Adults only)  Office Follow Up:  Does the office need to follow up with this patient?: Yes  Instructions For The Office: OFFICE PLEASE FOLLOW UP WITH PATIENTS REQUEST FOR PREDNISONE AND ANTIBIOTIC TO BE CALLED IN FOR HER  RN Note:  pt reports clear drainage.  Symptoms  Reason For Call & Symptoms: pt reports that she was seen in the office on 02/20/13.  Pt was treated with Prednisone.  Pt states now she is worse and she is requesting more medication (prednisone and antibiotic)  Reviewed Health History In EMR: Yes  Reviewed Medications In EMR: Yes  Reviewed Allergies In EMR: Yes  Reviewed Surgeries / Procedures: Yes  Date of Onset of Symptoms: 02/20/2013  Treatments Tried: allergy medications  Treatments Tried Worked: No  Guideline(s) Used:  Sinus Pain and Congestion  Disposition Per Guideline:   Home Care  Reason For Disposition Reached:   Sinus congestion as part of a cold, present < 10 days  Advice Given:  For a Stuffy Nose - Use Nasal Washes:  Introduction: Saline (salt water) nasal irrigation (nasal wash) is an effective and simple home remedy for treating stuffy nose and sinus congestion. The nose can be irrigated by pouring, spraying, or squirting salt water into the nose and then letting it run back out.  How it Helps: The salt water rinses out excess mucus, washes out any irritants (dust, allergens) that might be present, and moistens the nasal cavity.  Methods: There are several ways to perform nasal irrigation. You can use a saline nasal spray bottle (available over-the-counter), a rubber ear syringe, a medical syringe without the needle, or a Neti Pot.  Hydration:  Drink plenty of liquids (6-8 glasses of water daily). If the air in your home is dry, use a cool mist humidifier  Call Back If:   You become  worse.  Patient Refused Recommendation:  Patient Requests Prescription  Pt is requesting Prednisone and an antibiotic to be called in for her.  Pt uses [facility] 

## 2013-03-27 ENCOUNTER — Telehealth: Payer: Self-pay

## 2013-03-27 MED ORDER — IBUPROFEN 600 MG PO TABS: 600.0000 mg | ORAL_TABLET | Freq: Three times a day (TID) | ORAL | Status: DC | PRN

## 2013-03-27 NOTE — Telephone Encounter (Signed)
Please advise on refill as patient has called back for refill

## 2013-03-27 NOTE — Telephone Encounter (Signed)
Done erx 

## 2013-03-27 NOTE — Telephone Encounter (Signed)
Phone call from pt stating she is having sciatic nerve pain and it radiates down her legs. She is requesting a prescription for Ibuprofen 800 mg sent to [facility] pharmacy on [address]. Please advise.

## 2013-03-28 NOTE — Telephone Encounter (Signed)
Patient informed. 

## 2013-04-23 ENCOUNTER — Ambulatory Visit: Payer: 59 | Admitting: Internal Medicine

## 2013-05-14 ENCOUNTER — Telehealth: Payer: Self-pay | Admitting: Internal Medicine

## 2013-05-14 NOTE — Telephone Encounter (Signed)
Patient informed and declines referral at this time will continue on Adderall

## 2013-05-14 NOTE — Telephone Encounter (Signed)
I can refer to the ADD center for further eval and tx if she likes

## 2013-05-14 NOTE — Telephone Encounter (Signed)
Pt called stated that Adderall is not working as good anymore. Pt req different med or what Dr Jonny Ruiz wants to do. Please advise.

## 2013-06-20 ENCOUNTER — Other Ambulatory Visit: Payer: Self-pay | Admitting: Internal Medicine

## 2013-06-20 ENCOUNTER — Other Ambulatory Visit: Payer: Self-pay

## 2013-06-20 MED ORDER — AMPHETAMINE-DEXTROAMPHETAMINE 30 MG PO TABS: ORAL_TABLET | ORAL | Status: DC

## 2013-06-20 NOTE — Telephone Encounter (Signed)
Called the patient left a detailed message that rx requested has been filled and hardcopy is ready for pickup at the front desk.

## 2013-06-20 NOTE — Telephone Encounter (Signed)
Done hardcopy to robin  

## 2013-07-04 ENCOUNTER — Encounter (INDEPENDENT_AMBULATORY_CARE_PROVIDER_SITE_OTHER): Payer: 59 | Admitting: Ophthalmology

## 2013-07-04 DIAGNOSIS — H26499 Other secondary cataract, unspecified eye: Secondary | ICD-10-CM

## 2013-07-04 DIAGNOSIS — H43819 Vitreous degeneration, unspecified eye: Secondary | ICD-10-CM

## 2013-07-11 ENCOUNTER — Encounter (INDEPENDENT_AMBULATORY_CARE_PROVIDER_SITE_OTHER): Payer: Self-pay | Admitting: Ophthalmology

## 2013-07-15 ENCOUNTER — Encounter (INDEPENDENT_AMBULATORY_CARE_PROVIDER_SITE_OTHER): Payer: 59 | Admitting: Ophthalmology

## 2013-07-15 DIAGNOSIS — H26499 Other secondary cataract, unspecified eye: Secondary | ICD-10-CM

## 2013-08-05 ENCOUNTER — Ambulatory Visit (INDEPENDENT_AMBULATORY_CARE_PROVIDER_SITE_OTHER): Payer: 59 | Admitting: Ophthalmology

## 2013-08-05 DIAGNOSIS — H2700 Aphakia, unspecified eye: Secondary | ICD-10-CM

## 2013-08-22 ENCOUNTER — Ambulatory Visit (INDEPENDENT_AMBULATORY_CARE_PROVIDER_SITE_OTHER): Payer: 59 | Admitting: Internal Medicine

## 2013-08-22 ENCOUNTER — Encounter: Payer: Self-pay | Admitting: Internal Medicine

## 2013-08-22 VITALS — BP 120/70 | HR 68 | Temp 97.6°F | Ht 65.0 in | Wt 124.2 lb

## 2013-08-22 DIAGNOSIS — J449 Chronic obstructive pulmonary disease, unspecified: Secondary | ICD-10-CM

## 2013-08-22 DIAGNOSIS — F988 Other specified behavioral and emotional disorders with onset usually occurring in childhood and adolescence: Secondary | ICD-10-CM

## 2013-08-22 DIAGNOSIS — M545 Low back pain, unspecified: Secondary | ICD-10-CM

## 2013-08-22 DIAGNOSIS — G8921 Chronic pain due to trauma: Secondary | ICD-10-CM

## 2013-08-22 DIAGNOSIS — Z23 Encounter for immunization: Secondary | ICD-10-CM

## 2013-08-22 MED ORDER — METHYLPREDNISOLONE ACETATE 80 MG/ML IJ SUSP
80.0000 mg | Freq: Once | INTRAMUSCULAR | Status: AC
  Administered 2013-08-22: 80 mg via INTRAMUSCULAR

## 2013-08-22 MED ORDER — AMPHETAMINE-DEXTROAMPHETAMINE 30 MG PO TABS: ORAL_TABLET | ORAL | Status: DC

## 2013-08-22 MED ORDER — ALPRAZOLAM 0.5 MG PO TABS: ORAL_TABLET | ORAL | Status: DC

## 2013-08-22 MED ORDER — HYDROCODONE-ACETAMINOPHEN 10-325 MG PO TABS: 1.0000 | ORAL_TABLET | Freq: Four times a day (QID) | ORAL | Status: DC | PRN

## 2013-08-22 NOTE — Patient Instructions (Signed)
You had the flu shot today Ok to incresae the adderall as prescribed You had the steroid shot today for the lower back pain Please continue all other medications as before OK to try the capsaicin OTC for the finger pain, or tylenol as needed, and Zaditor OTC for the eye symptoms Please have the pharmacy call with any other refills you may need.  Please remember to sign up for My Chart if you have not done so, as this will be important to you in the future with finding out test results, communicating by private email, and scheduling acute appointments online when needed.  Please return in 6 months, or sooner if needed

## 2013-08-22 NOTE — Assessment & Plan Note (Signed)
Ok for adderall as prescribed,  to f/u any worsening symptoms or concerns

## 2013-08-22 NOTE — Progress Notes (Signed)
Subjective:    Patient ID: Kathy Vargas, female    DOB: Feb 11, 1951, 62 y.o.   MRN: [id number]  HPI  Here with 1 wk onset acute flare of her recurring right LBP with sciatica like pain, radiating to buttock and post thigh. No bowel or bladder change, fever, wt loss,  worsening LE pain/numbness/weakness, gait change or falls.   Also with third finger right hand PIP bony change, stiffness and pain worse in the past few months, has not tried capsaisin or advil.  Also mentions adderall at current dosing not working as well as generic pill manufact was changed - only gets improvement in symptoms with 1.5 tabs instead of 1. Also with 2 mo worsening eye allergy symtpoms , did see optho who confirmed, but she is convinced it might be from chemical exposure at her work place. Pt denies chest pain, increased sob or doe, wheezing, orthopnea, PND, increased LE swelling, palpitations, dizziness or syncope.  Past Medical History  Diagnosis Date  . ABDOMINAL TENDERNESS 01/21/2011  . ACUTE GINGIVITIS NONPLAQUE INDUCED 06/11/2010  . ADD 09/05/2008  . Alcohol abuse, in remission 09/27/2007  . ALLERGIC RHINITIS 03/02/2009  . ANXIETY 07/02/2007  . BACK PAIN 05/19/2009  . CHRONIC OBSTRUCTIVE PULMONARY DISEASE, ACUTE EXACERBATION 08/06/2009  . COPD 07/02/2007  . DEGENERATIVE JOINT DISEASE, CERVICAL SPINE 07/02/2007  . DEPRESSION 07/02/2007  . GASTROENTERITIS, VIRAL 11/29/2010  . GERD 01/21/2011  . Headache(784.0) 07/02/2007  . HYPOTHYROIDISM 07/02/2007  . OSTEOARTHRITIS 07/02/2007  . PAIN, CHRONIC, DUE TO TRAUMA 07/02/2007  . PELVIC  PAIN 03/29/2010  . Other and unspecified ovarian cyst 05/19/2009  . UTI 05/19/2009  . Vitamin D insufficiency   . Lumbar disc disease   . Lumbar pain with radiation down right leg 06/01/2011   Past Surgical History  Procedure Laterality Date  . Abdominal hysterectomy    . Cholecystectomy      reports that she has been smoking.  She does not have any smokeless tobacco history on file. She  reports that she does not drink alcohol or use illicit drugs. family history includes Alcohol abuse in her other; Anxiety disorder in her other; Coronary artery disease in her other; Depression in her other; Stroke in her other. Allergies  Allergen Reactions  . Morphine    Current Outpatient Prescriptions on File Prior to Visit  Medication Sig Dispense Refill  . estradiol (ESTRACE) 1 MG tablet Take 1 tablet (1 mg total) by mouth daily.  90 tablet  3  . ibuprofen (ADVIL,MOTRIN) 600 MG tablet Take 1 tablet (600 mg total) by mouth every 8 (eight) hours as needed for pain.  90 tablet  0  . [DISCONTINUED] FLUoxetine (PROZAC) 20 MG capsule Take 1 capsule (20 mg total) by mouth daily.  30 capsule  11   No current facility-administered medications on file prior to visit.   Review of Systems  Constitutional: Negative for unexpected weight change, or unusual diaphoresis  HENT: Negative for tinnitus.   Eyes: Negative for photophobia and visual disturbance.  Respiratory: Negative for choking and stridor.   Gastrointestinal: Negative for vomiting and blood in stool.  Genitourinary: Negative for hematuria and decreased urine volume.  Musculoskeletal: Negative for acute joint swelling Skin: Negative for color change and wound.  Neurological: Negative for tremors and numbness other than noted  Psychiatric/Behavioral: Negative for decreased concentration or  hyperactivity.       Objective:   Physical Exam BP 120/70  Pulse 68  Temp(Src) 97.6 F (36.4 C) (Oral)  Ht 5\' 5"  (  1.651 m)  Wt 124 lb 4 oz (56.359 kg)  BMI 20.68 kg/m2  SpO2 98% VS noted,  Constitutional: Pt appears well-developed and well-nourished.  HENT: Head: NCAT.  Right Ear: External ear normal.  Left Ear: External ear normal.  Eyes: Conjunctivae bilat conjunct erythema,and EOM are normal. Pupils are equal, round, and reactive to light.  Neck: Normal range of motion. Neck supple.  Cardiovascular: Normal rate and regular rhythm.    Pulmonary/Chest: Effort normal and breath sounds normal.  Abd:  Soft, NT, non-distended, + BS Neurological: Pt is alert. Not confused , motor 5/5 Skin: Skin is warm. No erythema.  Psychiatric: Pt behavior is normal. Thought content normal.  Spine nontender    Assessment & Plan:

## 2013-08-22 NOTE — Assessment & Plan Note (Signed)
stable overall by history and exam, recent data reviewed with pt, and pt to continue medical treatment as before,  to f/u any worsening symptoms or concerns SpO2 Readings from Last 3 Encounters:  08/22/13 98%  02/20/13 98%  10/11/12 99%

## 2013-08-22 NOTE — Assessment & Plan Note (Signed)
For pain med refill,s table

## 2013-08-22 NOTE — Addendum Note (Signed)
Addended by: Scharlene Gloss B on: 08/22/2013 05:46 PM   Modules accepted: Orders

## 2013-08-30 ENCOUNTER — Other Ambulatory Visit: Payer: Self-pay | Admitting: Internal Medicine

## 2013-09-24 ENCOUNTER — Encounter: Payer: Self-pay | Admitting: Internal Medicine

## 2013-09-24 ENCOUNTER — Ambulatory Visit (INDEPENDENT_AMBULATORY_CARE_PROVIDER_SITE_OTHER): Payer: 59 | Admitting: Internal Medicine

## 2013-09-24 VITALS — BP 110/62 | HR 85 | Temp 98.4°F | Ht 64.5 in | Wt 124.0 lb

## 2013-09-24 DIAGNOSIS — T783XXA Angioneurotic edema, initial encounter: Secondary | ICD-10-CM | POA: Insufficient documentation

## 2013-09-24 DIAGNOSIS — J449 Chronic obstructive pulmonary disease, unspecified: Secondary | ICD-10-CM

## 2013-09-24 DIAGNOSIS — J309 Allergic rhinitis, unspecified: Secondary | ICD-10-CM

## 2013-09-24 MED ORDER — METHYLPREDNISOLONE ACETATE 80 MG/ML IJ SUSP
120.0000 mg | Freq: Once | INTRAMUSCULAR | Status: AC
  Administered 2013-09-24: 120 mg via INTRAMUSCULAR

## 2013-09-24 MED ORDER — PREDNISONE 10 MG PO TABS: ORAL_TABLET | ORAL | Status: DC

## 2013-09-24 NOTE — Progress Notes (Signed)
Subjective:    Patient ID: Kathy Vargas, female    DOB: Dec 10, 1950, 62 y.o.   MRN: [id number]  HPI  Here with acute onset essentially painless facial, upper lip and periorbital swelling and itching, without tongue swelling, throat swelling or sob/wheezing. Pt denies chest pain, increased sob or doe, wheezing, orthopnea, PND, increased LE swelling, palpitations, dizziness or syncope.    Past Medical History  Diagnosis Date  . ABDOMINAL TENDERNESS 01/21/2011  . ACUTE GINGIVITIS NONPLAQUE INDUCED 06/11/2010  . ADD 09/05/2008  . Alcohol abuse, in remission 09/27/2007  . ALLERGIC RHINITIS 03/02/2009  . ANXIETY 07/02/2007  . BACK PAIN 05/19/2009  . CHRONIC OBSTRUCTIVE PULMONARY DISEASE, ACUTE EXACERBATION 08/06/2009  . COPD 07/02/2007  . DEGENERATIVE JOINT DISEASE, CERVICAL SPINE 07/02/2007  . DEPRESSION 07/02/2007  . GASTROENTERITIS, VIRAL 11/29/2010  . GERD 01/21/2011  . Headache(784.0) 07/02/2007  . HYPOTHYROIDISM 07/02/2007  . OSTEOARTHRITIS 07/02/2007  . PAIN, CHRONIC, DUE TO TRAUMA 07/02/2007  . PELVIC  PAIN 03/29/2010  . Other and unspecified ovarian cyst 05/19/2009  . UTI 05/19/2009  . Vitamin D insufficiency   . Lumbar disc disease   . Lumbar pain with radiation down right leg 06/01/2011   Past Surgical History  Procedure Laterality Date  . Abdominal hysterectomy    . Cholecystectomy      reports that she has been smoking.  She does not have any smokeless tobacco history on file. She reports that she does not drink alcohol or use illicit drugs. family history includes Alcohol abuse in her other; Anxiety disorder in her other; Coronary artery disease in her other; Depression in her other; Stroke in her other. Allergies  Allergen Reactions  . Morphine    Current Outpatient Prescriptions on File Prior to Visit  Medication Sig Dispense Refill  . ALPRAZolam (XANAX) 0.5 MG tablet Take up to one and 1/2 tabs by mouth per day as needed for nerves; to fill Mar 13, 2013  45 tablet  5  .  amphetamine-dextroamphetamine (ADDERALL) 30 MG tablet 1.5 tab by mouth in the AM, and 1/2 tab in the PM - - to fill Oct 21, 2013  60 tablet  0  . estradiol (ESTRACE) 1 MG tablet Take 1 tablet (1 mg total) by mouth daily.  90 tablet  3  . HYDROcodone-acetaminophen (NORCO) 10-325 MG per tablet Take 1-2 tablets by mouth every 6 (six) hours as needed. To fill Aug 22, 2013  120 tablet  5  . ibuprofen (ADVIL,MOTRIN) 600 MG tablet Take 1 tablet (600 mg total) by mouth every 8 (eight) hours as needed for pain.  90 tablet  0  . [DISCONTINUED] FLUoxetine (PROZAC) 20 MG capsule Take 1 capsule (20 mg total) by mouth daily.  30 capsule  11   No current facility-administered medications on file prior to visit.   Review of Systems  Constitutional: Negative for unexpected weight change, or unusual diaphoresis  HENT: Negative for tinnitus.   Eyes: Negative for photophobia and visual disturbance.  Respiratory: Negative for choking and stridor.   Gastrointestinal: Negative for vomiting and blood in stool.  Genitourinary: Negative for hematuria and decreased urine volume.  Musculoskeletal: Negative for acute joint swelling Skin: Negative for color change and wound.  Neurological: Negative for tremors and numbness other than noted  Psychiatric/Behavioral: Negative for decreased concentration or  hyperactivity.       Objective:   Physical Exam BP 110/62  Pulse 85  Temp(Src) 98.4 F (36.9 C) (Oral)  Ht 5' 4.5" (1.638 m)  Wt 124  lb (56.246 kg)  BMI 20.96 kg/m2  SpO2 99% Constitutional: Pt appears well-developed and well-nourished.  HENT: Head: NCAT.  Right Ear: External ear normal.  Left Ear: External ear normal.  Eyes: Conjunctivae and EOM are normal. Pupils are equal, round, and reactive to light.  Neck: Normal range of motion. Neck supple.  Cardiovascular: Normal rate and regular rhythm.   Pulmonary/Chest: Effort normal and breath sounds normal.  Neurological: Pt is alert. Not confused  Skin: with  diffuse facail, periorbital and upper lip 1+ swelling  Psychiatric: Pt behavior is normal. Thought content normal.     Assessment & Plan:

## 2013-09-24 NOTE — Assessment & Plan Note (Signed)
Mild to mod, for depomedrol IM, and predpack asd,,  to f/u any worsening symptoms or concerns

## 2013-09-24 NOTE — Assessment & Plan Note (Signed)
stable overall by history and exam, recent data reviewed with pt, and pt to continue medical treatment as before,  to f/u any worsening symptoms or concerns SpO2 Readings from Last 3 Encounters:  09/24/13 99%  08/22/13 98%  02/20/13 98%

## 2013-09-24 NOTE — Patient Instructions (Signed)
You had the steroid shot today Please take all new medication as prescribed - the prednisone Please continue all other medications as before, and refills have been done if requested.

## 2013-09-24 NOTE — Assessment & Plan Note (Signed)
stable overall by history and exam, should improve also with tx,, and pt to continue medical treatment as before,  to f/u any worsening symptoms or concerns

## 2013-10-14 ENCOUNTER — Telehealth: Payer: Self-pay

## 2013-10-14 NOTE — Telephone Encounter (Signed)
The patient called hoping to get medicine for a poss uti.  She was offered an apt, but declined.  She states she is certain she has a uti, and is hoping the rx can be called in.

## 2013-10-15 ENCOUNTER — Encounter: Payer: Self-pay | Admitting: Internal Medicine

## 2013-10-15 ENCOUNTER — Ambulatory Visit (INDEPENDENT_AMBULATORY_CARE_PROVIDER_SITE_OTHER): Payer: 59 | Admitting: Internal Medicine

## 2013-10-15 ENCOUNTER — Telehealth: Payer: Self-pay

## 2013-10-15 ENCOUNTER — Other Ambulatory Visit (INDEPENDENT_AMBULATORY_CARE_PROVIDER_SITE_OTHER): Payer: 59

## 2013-10-15 VITALS — BP 130/72 | HR 89 | Temp 102.4°F | Wt 123.0 lb

## 2013-10-15 DIAGNOSIS — M549 Dorsalgia, unspecified: Secondary | ICD-10-CM

## 2013-10-15 DIAGNOSIS — N10 Acute tubulo-interstitial nephritis: Secondary | ICD-10-CM

## 2013-10-15 DIAGNOSIS — J449 Chronic obstructive pulmonary disease, unspecified: Secondary | ICD-10-CM

## 2013-10-15 DIAGNOSIS — F411 Generalized anxiety disorder: Secondary | ICD-10-CM

## 2013-10-15 MED ORDER — PROMETHAZINE HCL 25 MG/ML IJ SOLN
25.0000 mg | Freq: Once | INTRAMUSCULAR | Status: AC
  Administered 2013-10-15: 25 mg via INTRAMUSCULAR

## 2013-10-15 MED ORDER — CEFTRIAXONE SODIUM 1 G IJ SOLR
1.0000 g | Freq: Once | INTRAMUSCULAR | Status: AC
  Administered 2013-10-15: 1 g via INTRAMUSCULAR

## 2013-10-15 MED ORDER — LEVOFLOXACIN 500 MG PO TABS: 500.0000 mg | ORAL_TABLET | Freq: Every day | ORAL | Status: DC

## 2013-10-15 MED ORDER — ONDANSETRON HCL 4 MG PO TABS: 4.0000 mg | ORAL_TABLET | Freq: Three times a day (TID) | ORAL | Status: DC | PRN

## 2013-10-15 NOTE — Progress Notes (Signed)
Subjective:    Patient ID: Kathy Vargas, female    DOB: [birth year], 62 y.o.   MRN: [id number]  HPI  Here quite ill with acute c/o 3 days graduallty worsening fever, general weakness and malaise, lower abd pain with nausea and right flank pain today with urinary odd smell, urgency, seemed to start after finishing work yesterday, but just too ill to force herself to go to ER today.  Also with HA, but no vomiting, chills and Denies urinary symptoms such as dysuria, frequency, hematuria. No recent UTI or known resistant organism.  Pt denies chest pain, increased sob or doe, wheezing, orthopnea, PND, increased LE swelling, palpitations, dizziness or syncope. Pt denies new neurological symptoms such as new headache, or facial or extremity weakness or numbness   Pt denies polydipsia, polyuria. Refusing ER eval tonight, and didn't want to come for OV, but here after daughter brought her. Past Medical History  Diagnosis Date  . ABDOMINAL TENDERNESS 01/21/2011  . ACUTE GINGIVITIS NONPLAQUE INDUCED 06/11/2010  . ADD 09/05/2008  . Alcohol abuse, in remission 09/27/2007  . ALLERGIC RHINITIS 03/02/2009  . ANXIETY 07/02/2007  . BACK PAIN 05/19/2009  . CHRONIC OBSTRUCTIVE PULMONARY DISEASE, ACUTE EXACERBATION 08/06/2009  . COPD 07/02/2007  . DEGENERATIVE JOINT DISEASE, CERVICAL SPINE 07/02/2007  . DEPRESSION 07/02/2007  . GASTROENTERITIS, VIRAL 11/29/2010  . GERD 01/21/2011  . Headache(784.0) 07/02/2007  . HYPOTHYROIDISM 07/02/2007  . OSTEOARTHRITIS 07/02/2007  . PAIN, CHRONIC, DUE TO TRAUMA 07/02/2007  . PELVIC  PAIN 03/29/2010  . Other and unspecified ovarian cyst 05/19/2009  . UTI 05/19/2009  . Vitamin D insufficiency   . Lumbar disc disease   . Lumbar pain with radiation down right leg 06/01/2011   Past Surgical History  Procedure Laterality Date  . Abdominal hysterectomy    . Cholecystectomy      reports that she has been smoking.  She does not have any smokeless tobacco history on file. She reports that she  does not drink alcohol or use illicit drugs. family history includes Alcohol abuse in her other; Anxiety disorder in her other; Coronary artery disease in her other; Depression in her other; Stroke in her other. Allergies  Allergen Reactions  . Morphine    Current Outpatient Prescriptions on File Prior to Visit  Medication Sig Dispense Refill  . ALPRAZolam (XANAX) 0.5 MG tablet Take up to one and 1/2 tabs by mouth per day as needed for nerves; to fill Mar 13, 2013  45 tablet  5  . amphetamine-dextroamphetamine (ADDERALL) 30 MG tablet 1.5 tab by mouth in the AM, and 1/2 tab in the PM - - to fill Oct 21, 2013  60 tablet  0  . HYDROcodone-acetaminophen (NORCO) 10-325 MG per tablet Take 1-2 tablets by mouth every 6 (six) hours as needed. To fill Aug 22, 2013  120 tablet  5  . ibuprofen (ADVIL,MOTRIN) 600 MG tablet Take 1 tablet (600 mg total) by mouth every 8 (eight) hours as needed for pain.  90 tablet  0  . predniSONE (DELTASONE) 10 MG tablet TAKE THREE (3) TABLETS BY MOUTH DAILY FOR 3 DAYS, THEN TWO (2) TABLETS DAILY FOR 3 DAYS, THEN 1 TABLET DAILY FOR 3 DAYS.  18 tablet  0  . estradiol (ESTRACE) 1 MG tablet Take 1 tablet (1 mg total) by mouth daily.  90 tablet  3  . [DISCONTINUED] FLUoxetine (PROZAC) 20 MG capsule Take 1 capsule (20 mg total) by mouth daily.  30 capsule  11   No current facility-administered  medications on file prior to visit.   Review of Systems  Constitutional: Negative for unexpected weight change, or unusual diaphoresis  HENT: Negative for tinnitus.   Eyes: Negative for photophobia and visual disturbance.  Respiratory: Negative for choking and stridor.   Gastrointestinal: Negative for vomiting and blood in stool.  Genitourinary: Negative for hematuria and decreased urine volume.  Musculoskeletal: Negative for acute joint swelling Skin: Negative for color change and wound.  Neurological: Negative for tremors and numbness other than noted  Psychiatric/Behavioral: Negative  for decreased concentration or  hyperactivity.       Objective:   Physical Exam BP 130/72  Pulse 89  Temp(Src) 102.4 F (39.1 C) (Oral)  Wt 123 lb (55.792 kg)  SpO2 93% VS noted, mod ill appearing, general weak, prefers to lie on exam table Constitutional: Pt appears well-developed and well-nourished.  HENT: Head: NCAT.  Right Ear: External ear normal.  Left Ear: External ear normal.  Eyes: Conjunctivae and EOM are normal. Pupils are equal, round, and reactive to light.  Neck: Normal range of motion. Neck supple.  Cardiovascular: Normal rate and regular rhythm.   Pulmonary/Chest: Effort normal and breath sounds normal.  Abd:  Soft, non-distended, + BS, with mod low mid abd tender, no guarding + right flank mild to mod tender Neurological: Pt is alert. Not confused  Skin: Skin is warm. No erythema.  Psychiatric: Pt behavior is normal. Thought content normal. mild nervous    Assessment & Plan:

## 2013-10-15 NOTE — Progress Notes (Signed)
Pre-visit discussion using our clinic review tool. No additional management support is needed unless otherwise documented below in the visit note.  

## 2013-10-15 NOTE — Assessment & Plan Note (Signed)
O/w stable overall by history and exam, , and pt to continue medical treatment as before,  to f/u any worsening symptoms or concerns

## 2013-10-15 NOTE — Telephone Encounter (Signed)
The patient has appointment tonight at 6:15.

## 2013-10-15 NOTE — Telephone Encounter (Signed)
Very sorry, but office policy is to not tx with antibx unless definitely indicated, usually requiring an exam; there can be other reasons for urinary tract symtpoms as well, please consider OV today

## 2013-10-15 NOTE — Assessment & Plan Note (Signed)
stable overall by history and exam, recent data reviewed with pt, and pt to continue medical treatment as before,  to f/u any worsening symptoms or concerns SpO2 Readings from Last 3 Encounters:  10/15/13 93%  09/24/13 99%  08/22/13 98%

## 2013-10-15 NOTE — Patient Instructions (Addendum)
You had the antibiotic shot today (rocephin), and the nausea shot (phenergan) Please take all new medication as prescribed - the levaquin antibiotic (generic), and zofran (generic) for nausea Please continue all other medications as before Please have the pharmacy call with any other refills you may need.  Please go to ER for any worsening fever, chills, vomiting, weakness, falls, pain or blood.  You are given the work note

## 2013-10-15 NOTE — Telephone Encounter (Signed)
Lab ordered.

## 2013-10-15 NOTE — Assessment & Plan Note (Addendum)
Clinical dx, pt declines ER eval with labs and poss CT, has urine studies here (resuilts not available at this time) but not able to get other labs such as cbc now as office lab is closed and she declines to go to other lab; for rocephin IM, phenergan 25 IM, and levaquin/zofran for home; daughter here to assist with transport; to go to ER for any worsening fever, vomiting, weakness/falls or pain

## 2013-10-16 LAB — URINALYSIS, ROUTINE W REFLEX MICROSCOPIC
Bilirubin Urine: NEGATIVE
Nitrite: NEGATIVE
Specific Gravity, Urine: 1.025 (ref 1.000–1.030)
pH: 6 (ref 5.0–8.0)

## 2013-10-21 ENCOUNTER — Telehealth: Payer: Self-pay | Admitting: Internal Medicine

## 2013-10-21 DIAGNOSIS — N10 Acute tubulo-interstitial nephritis: Secondary | ICD-10-CM

## 2013-10-21 NOTE — Telephone Encounter (Signed)
10-21-2013  Pt came by and is requesting urinalysis lab results from last week.  Pt states she is some better but not completely better.  Would like another antibiotic if possible.Please advise.  JSL

## 2013-10-22 NOTE — Telephone Encounter (Signed)
See below

## 2013-10-22 NOTE — Telephone Encounter (Signed)
Called the patient back informed of MD instructions.  States she is having pain under her breast ( patient states thinks may be a hernia or ulcer).  Advise please

## 2013-10-22 NOTE — Telephone Encounter (Signed)
Called the patient left message to call back.  Ordered UA

## 2013-10-22 NOTE — Telephone Encounter (Signed)
Please consider OV if pain persists, worsens, or have fever, cough, sob or other unusual symtpoms

## 2013-10-22 NOTE — Telephone Encounter (Signed)
There can actually be some irriation left over and may not need further antibx if fever and pain are gone;  i would ask for UA and culture before more antibx  - 590.10 - robin to add

## 2013-10-23 NOTE — Telephone Encounter (Signed)
Called left message to call back 

## 2013-10-23 NOTE — Telephone Encounter (Signed)
Patient informed of MD instructions. 

## 2013-11-27 ENCOUNTER — Telehealth: Payer: Self-pay | Admitting: Internal Medicine

## 2013-11-27 MED ORDER — AMPHETAMINE-DEXTROAMPHETAMINE 30 MG PO TABS: ORAL_TABLET | ORAL | Status: DC

## 2013-11-27 NOTE — Telephone Encounter (Signed)
Left message on VM Rx ready 

## 2013-11-27 NOTE — Telephone Encounter (Signed)
Patient picked up rx for adderall.  She is requesting two more months to be written out.  Please give a call in regards.

## 2013-11-27 NOTE — Telephone Encounter (Signed)
Done hardcopy to robin  

## 2013-11-27 NOTE — Telephone Encounter (Signed)
Done hardcopy to staff 

## 2013-12-23 ENCOUNTER — Telehealth: Payer: Self-pay | Admitting: Internal Medicine

## 2013-12-23 NOTE — Telephone Encounter (Signed)
Pt needs a P A for generic adderall sent to her [employer].  [facility] faxed the form today.  She is out of her medicine.

## 2013-12-24 NOTE — Telephone Encounter (Signed)
Have received PA form and will do asap.  Will then notify the patient and pharmacy

## 2013-12-25 ENCOUNTER — Encounter: Payer: Self-pay | Admitting: Internal Medicine

## 2013-12-25 ENCOUNTER — Ambulatory Visit (INDEPENDENT_AMBULATORY_CARE_PROVIDER_SITE_OTHER): Payer: 59 | Admitting: Internal Medicine

## 2013-12-25 ENCOUNTER — Telehealth: Payer: Self-pay

## 2013-12-25 VITALS — BP 142/82 | HR 83 | Temp 99.1°F | Ht 65.0 in | Wt 126.3 lb

## 2013-12-25 DIAGNOSIS — R059 Cough, unspecified: Secondary | ICD-10-CM

## 2013-12-25 DIAGNOSIS — R05 Cough: Secondary | ICD-10-CM | POA: Insufficient documentation

## 2013-12-25 DIAGNOSIS — F411 Generalized anxiety disorder: Secondary | ICD-10-CM

## 2013-12-25 DIAGNOSIS — J449 Chronic obstructive pulmonary disease, unspecified: Secondary | ICD-10-CM

## 2013-12-25 MED ORDER — CEFTRIAXONE SODIUM 1 G IJ SOLR
1.0000 g | Freq: Once | INTRAMUSCULAR | Status: AC
  Administered 2013-12-25: 1 g via INTRAMUSCULAR

## 2013-12-25 MED ORDER — HYDROCODONE-HOMATROPINE 5-1.5 MG/5ML PO SYRP: 5.0000 mL | ORAL_SOLUTION | Freq: Four times a day (QID) | ORAL | Status: DC | PRN

## 2013-12-25 MED ORDER — AZITHROMYCIN 250 MG PO TABS: ORAL_TABLET | ORAL | Status: DC

## 2013-12-25 MED ORDER — OSELTAMIVIR PHOSPHATE 75 MG PO CAPS: 75.0000 mg | ORAL_CAPSULE | Freq: Two times a day (BID) | ORAL | Status: DC

## 2013-12-25 NOTE — Assessment & Plan Note (Signed)
stable overall by history and exam, recent data reviewed with pt, and pt to continue medical treatment as before,  to f/u any worsening symptoms or concerns Lab Results  Component Value Date   WBC 7.1 02/18/2013   HGB 12.1 02/18/2013   HCT 36.1 02/18/2013   PLT 295.0 02/18/2013   GLUCOSE 94 02/18/2013   CHOL 191 02/18/2013   TRIG 134.0 02/18/2013   HDL 67.00 02/18/2013   LDLDIRECT 121.4 12/02/2010   LDLCALC 97 02/18/2013   ALT 15 02/18/2013   AST 20 02/18/2013   NA 136 02/18/2013   K 3.9 02/18/2013   CL 102 02/18/2013   CREATININE 0.7 02/18/2013   BUN 14 02/18/2013   CO2 27 02/18/2013   TSH 1.23 02/18/2013

## 2013-12-25 NOTE — Progress Notes (Signed)
Pre-visit discussion using our clinic review tool. No additional management support is needed unless otherwise documented below in the visit note.  

## 2013-12-25 NOTE — Progress Notes (Signed)
Subjective:    Patient ID: Kathy Vargas, female    DOB: Dec 13, 1950, 63 y.o.   MRN: [id number]  HPI  Here with sudden onset this AM of feverish, dry cough, anterior chest aching, diffuse myalgias, general weakness and malaise, intermittent nausea but Denies worsening reflux, abd pain, dysphagia, vomiting, bowel change or blood.   Pt denies chest pain, increased sob or doe, wheezing, orthopnea, PND, increased LE swelling, palpitations, dizziness or syncope other than that above.   Pt denies polydipsia, polyuria, Pt denies new neurological symptoms such as new headache, or facial or extremity weakness or numbness  . Denies worsening depressive symptoms, suicidal ideation, or panic  Past Medical History  Diagnosis Date  . ABDOMINAL TENDERNESS 01/21/2011  . ACUTE GINGIVITIS NONPLAQUE INDUCED 06/11/2010  . ADD 09/05/2008  . Alcohol abuse, in remission 09/27/2007  . ALLERGIC RHINITIS 03/02/2009  . ANXIETY 07/02/2007  . BACK PAIN 05/19/2009  . CHRONIC OBSTRUCTIVE PULMONARY DISEASE, ACUTE EXACERBATION 08/06/2009  . COPD 07/02/2007  . DEGENERATIVE JOINT DISEASE, CERVICAL SPINE 07/02/2007  . DEPRESSION 07/02/2007  . GASTROENTERITIS, VIRAL 11/29/2010  . GERD 01/21/2011  . Headache(784.0) 07/02/2007  . HYPOTHYROIDISM 07/02/2007  . OSTEOARTHRITIS 07/02/2007  . PAIN, CHRONIC, DUE TO TRAUMA 07/02/2007  . PELVIC  PAIN 03/29/2010  . Other and unspecified ovarian cyst 05/19/2009  . UTI 05/19/2009  . Vitamin D insufficiency   . Lumbar disc disease   . Lumbar pain with radiation down right leg 06/01/2011   Past Surgical History  Procedure Laterality Date  . Abdominal hysterectomy    . Cholecystectomy      reports that she has been smoking.  She does not have any smokeless tobacco history on file. She reports that she does not drink alcohol or use illicit drugs. family history includes Alcohol abuse in her other; Anxiety disorder in her other; Coronary artery disease in her other; Depression in her other; Stroke in her  other. Allergies  Allergen Reactions  . Morphine    Current Outpatient Prescriptions on File Prior to Visit  Medication Sig Dispense Refill  . ALPRAZolam (XANAX) 0.5 MG tablet Take up to one and 1/2 tabs by mouth per day as needed for nerves; to fill Mar 13, 2013  45 tablet  5  . amphetamine-dextroamphetamine (ADDERALL) 30 MG tablet 1.5 tab by mouth in the AM, and 1/2 tab in the PM - to fill Jan 26, 2014  60 tablet  0  . HYDROcodone-acetaminophen (NORCO) 10-325 MG per tablet Take 1-2 tablets by mouth every 6 (six) hours as needed. To fill Aug 22, 2013  120 tablet  5  . ibuprofen (ADVIL,MOTRIN) 600 MG tablet Take 1 tablet (600 mg total) by mouth every 8 (eight) hours as needed for pain.  90 tablet  0  . ondansetron (ZOFRAN) 4 MG tablet Take 1 tablet (4 mg total) by mouth every 8 (eight) hours as needed for nausea or vomiting.  30 tablet  1  . estradiol (ESTRACE) 1 MG tablet Take 1 tablet (1 mg total) by mouth daily.  90 tablet  3  . [DISCONTINUED] FLUoxetine (PROZAC) 20 MG capsule Take 1 capsule (20 mg total) by mouth daily.  30 capsule  11   No current facility-administered medications on file prior to visit.   Review of Systems  Constitutional: Negative for unexpected weight change, or unusual diaphoresis  HENT: Negative for tinnitus.   Eyes: Negative for photophobia and visual disturbance.  Respiratory: Negative for choking and stridor.   Gastrointestinal: Negative for vomiting  and blood in stool.  Genitourinary: Negative for hematuria and decreased urine volume.  Musculoskeletal: Negative for acute joint swelling Skin: Negative for color change and wound.  Neurological: Negative for tremors and numbness other than noted  Psychiatric/Behavioral: Negative for decreased concentration or  hyperactivity.       Objective:   Physical Exam BP 142/82  Pulse 83  Temp(Src) 99.1 F (37.3 C) (Oral)  Ht 5\' 5"  (1.651 m)  Wt 126 lb 5 oz (57.295 kg)  BMI 21.02 kg/m2  SpO2 98% VS noted, mild  ill Constitutional: Pt appears well-developed and well-nourished.  HENT: Head: NCAT.  Right Ear: External ear normal.  Left Ear: External ear normal.  Bilat tm's with mild erythema.  Max sinus areas mild tender.  Pharynx with mild erythema, no exudate Eyes: Conjunctivae and EOM are normal. Pupils are equal, round, and reactive to light.  Neck: Normal range of motion. Neck supple.  Cardiovascular: Normal rate and regular rhythm.   Pulmonary/Chest: Effort normal and breath sounds decreased, no rales or wheezing.  Abd:  Soft, NT, non-distended, +BS Neurological: Pt is alert. Not confused  Skin: Skin is warm. No erythema.  Psychiatric: Pt behavior is normal. Thought content normal. mild nervous    Assessment & Plan:

## 2013-12-25 NOTE — Patient Instructions (Signed)
You had the antibiotic shot today (rocephin) Please take all new medication as prescribed - the zpack, tamiflu, and cough medicine Please continue all other medications as before, and refills have been done if requested. Please have the pharmacy call with any other refills you may need.  Please remember to sign up for My Chart if you have not done so, as this will be important to you in the future with finding out test results, communicating by private email, and scheduling acute appointments online when needed.

## 2013-12-25 NOTE — Telephone Encounter (Signed)
PA for Amphetamine salt com 30 mg has been approved from 12/25/2013 through 12/25/2014  ZO#[id number].  Patient has been approved and pharmacy

## 2013-12-25 NOTE — Assessment & Plan Note (Signed)
stable overall by history and exam, recent data reviewed with pt, and pt to continue medical treatment as before,  to f/u any worsening symptoms or concerns SpO2 Readings from Last 3 Encounters:  12/25/13 98%  10/15/13 93%  09/24/13 99%

## 2013-12-25 NOTE — Assessment & Plan Note (Signed)
?   Atypical pna vs influenza, for zpack, tamiflu, cough med, work note,  to f/u any worsening symptoms or concerns

## 2013-12-26 ENCOUNTER — Telehealth: Payer: Self-pay | Admitting: Internal Medicine

## 2013-12-26 NOTE — Telephone Encounter (Signed)
Relevant patient education mailed to patient.  

## 2013-12-31 ENCOUNTER — Telehealth: Payer: Self-pay | Admitting: *Deleted

## 2013-12-31 NOTE — Telephone Encounter (Signed)
Already has a refill on the zpack, and I'm not supposed to give more cough med with hydrocodone, as she is already on norco

## 2013-12-31 NOTE — Telephone Encounter (Signed)
Patient phoned to speak with Robin.  Her sxs are improving, but she still isn't completely better.  Requests a pred pack and a refill on cough syrup.  CB# [phone]

## 2014-01-01 NOTE — Telephone Encounter (Signed)
Patient came by the office and was informed of MD instructions on medication.

## 2014-01-01 NOTE — Telephone Encounter (Signed)
Called left message to call back 

## 2014-02-13 ENCOUNTER — Encounter (HOSPITAL_COMMUNITY): Payer: Self-pay | Admitting: Emergency Medicine

## 2014-02-13 ENCOUNTER — Emergency Department (HOSPITAL_COMMUNITY)
Admission: EM | Admit: 2014-02-13 | Discharge: 2014-02-13 | Disposition: A | Discharge status: Home / Self Care | Payer: 59 | Attending: Emergency Medicine | Admitting: Emergency Medicine

## 2014-02-13 DIAGNOSIS — F411 Generalized anxiety disorder: Secondary | ICD-10-CM | POA: Insufficient documentation

## 2014-02-13 DIAGNOSIS — Z79899 Other long term (current) drug therapy: Secondary | ICD-10-CM | POA: Insufficient documentation

## 2014-02-13 DIAGNOSIS — M1990 Unspecified osteoarthritis, unspecified site: Secondary | ICD-10-CM | POA: Insufficient documentation

## 2014-02-13 DIAGNOSIS — F329 Major depressive disorder, single episode, unspecified: Secondary | ICD-10-CM | POA: Insufficient documentation

## 2014-02-13 DIAGNOSIS — M545 Low back pain, unspecified: Secondary | ICD-10-CM | POA: Insufficient documentation

## 2014-02-13 DIAGNOSIS — Z8639 Personal history of other endocrine, nutritional and metabolic disease: Secondary | ICD-10-CM | POA: Insufficient documentation

## 2014-02-13 DIAGNOSIS — M25559 Pain in unspecified hip: Secondary | ICD-10-CM | POA: Insufficient documentation

## 2014-02-13 DIAGNOSIS — F988 Other specified behavioral and emotional disorders with onset usually occurring in childhood and adolescence: Secondary | ICD-10-CM | POA: Insufficient documentation

## 2014-02-13 DIAGNOSIS — F17200 Nicotine dependence, unspecified, uncomplicated: Secondary | ICD-10-CM | POA: Insufficient documentation

## 2014-02-13 DIAGNOSIS — J449 Chronic obstructive pulmonary disease, unspecified: Secondary | ICD-10-CM | POA: Insufficient documentation

## 2014-02-13 DIAGNOSIS — Z87440 Personal history of urinary (tract) infections: Secondary | ICD-10-CM | POA: Insufficient documentation

## 2014-02-13 DIAGNOSIS — G8929 Other chronic pain: Secondary | ICD-10-CM

## 2014-02-13 DIAGNOSIS — R279 Unspecified lack of coordination: Secondary | ICD-10-CM | POA: Insufficient documentation

## 2014-02-13 DIAGNOSIS — Z8742 Personal history of other diseases of the female genital tract: Secondary | ICD-10-CM | POA: Insufficient documentation

## 2014-02-13 DIAGNOSIS — M549 Dorsalgia, unspecified: Secondary | ICD-10-CM

## 2014-02-13 DIAGNOSIS — Z8719 Personal history of other diseases of the digestive system: Secondary | ICD-10-CM | POA: Insufficient documentation

## 2014-02-13 DIAGNOSIS — F3289 Other specified depressive episodes: Secondary | ICD-10-CM | POA: Insufficient documentation

## 2014-02-13 DIAGNOSIS — Z8619 Personal history of other infectious and parasitic diseases: Secondary | ICD-10-CM | POA: Insufficient documentation

## 2014-02-13 DIAGNOSIS — Z862 Personal history of diseases of the blood and blood-forming organs and certain disorders involving the immune mechanism: Secondary | ICD-10-CM | POA: Insufficient documentation

## 2014-02-13 DIAGNOSIS — M479 Spondylosis, unspecified: Secondary | ICD-10-CM | POA: Insufficient documentation

## 2014-02-13 DIAGNOSIS — J4489 Other specified chronic obstructive pulmonary disease: Secondary | ICD-10-CM | POA: Insufficient documentation

## 2014-02-13 DIAGNOSIS — IMO0001 Reserved for inherently not codable concepts without codable children: Secondary | ICD-10-CM | POA: Insufficient documentation

## 2014-02-13 MED ORDER — HYDROCODONE-ACETAMINOPHEN 5-325 MG PO TABS: 2.0000 | ORAL_TABLET | ORAL | Status: DC | PRN

## 2014-02-13 MED ORDER — DEXAMETHASONE SODIUM PHOSPHATE 10 MG/ML IJ SOLN
10.0000 mg | Freq: Once | INTRAMUSCULAR | Status: AC
  Administered 2014-02-13: 10 mg via INTRAMUSCULAR
  Filled 2014-02-13: qty 1

## 2014-02-13 MED ORDER — PREDNISONE 20 MG PO TABS: 40.0000 mg | ORAL_TABLET | Freq: Every day | ORAL | Status: DC

## 2014-02-13 NOTE — Discharge Instructions (Signed)
Take prednisone as directed until gone. Take Vicodin as needed for pain. Follow up with your doctor.

## 2014-02-13 NOTE — ED Provider Notes (Signed)
CSN: [id number]     Arrival date & time 02/13/14  1220 History  This chart was scribed for non-physician practitioner working with Ethelda Chick, MD by Ashley Jacobs, ED scribe. This patient was seen in room WTR8/WTR8 and the patient's care was started at 2:00 PM.   First MD Initiated Contact with Patient 02/13/14 1329     Chief Complaint  Patient presents with  . Back Pain     (Consider location/radiation/quality/duration/timing/severity/associated sxs/prior Treatment) The history is provided by the patient and medical records. No language interpreter was used.   HPI Comments: Kathy Vargas is a 63 y.o. female , hx of buldging disc, who presents to the Emergency Department complaining of constant, moderate back pain. She also complains of constant, moderate hip pain as an associated symptom. Pt has tried hydrocodone for the pain today.  Pt mentions that the pain is not often and her regular physician that she sees for the pain is out of town. She is usually given Cortisone and prednisone injection. The pain started when she was at work today and she drove to ED. Nothing seems to make her symptoms better.   Past Medical History  Diagnosis Date  . ABDOMINAL TENDERNESS 01/21/2011  . ACUTE GINGIVITIS NONPLAQUE INDUCED 06/11/2010  . ADD 09/05/2008  . Alcohol abuse, in remission 09/27/2007  . ALLERGIC RHINITIS 03/02/2009  . ANXIETY 07/02/2007  . BACK PAIN 05/19/2009  . CHRONIC OBSTRUCTIVE PULMONARY DISEASE, ACUTE EXACERBATION 08/06/2009  . COPD 07/02/2007  . DEGENERATIVE JOINT DISEASE, CERVICAL SPINE 07/02/2007  . DEPRESSION 07/02/2007  . GASTROENTERITIS, VIRAL 11/29/2010  . GERD 01/21/2011  . Headache(784.0) 07/02/2007  . HYPOTHYROIDISM 07/02/2007  . OSTEOARTHRITIS 07/02/2007  . PAIN, CHRONIC, DUE TO TRAUMA 07/02/2007  . PELVIC  PAIN 03/29/2010  . Other and unspecified ovarian cyst 05/19/2009  . UTI 05/19/2009  . Vitamin D insufficiency   . Lumbar disc disease   . Lumbar pain with radiation down  right leg 06/01/2011   Past Surgical History  Procedure Laterality Date  . Abdominal hysterectomy    . Cholecystectomy     Family History  Problem Relation Age of Onset  . Alcohol abuse Other   . Anxiety disorder Other   . Coronary artery disease Other   . Depression Other   . Stroke Other    History  Substance Use Topics  . Smoking status: Current Every Day Smoker  . Smokeless tobacco: Not on file  . Alcohol Use: No   OB History   Grav Para Term Preterm Abortions TAB SAB Ect Mult Living                 Review of Systems  Musculoskeletal: Positive for arthralgias, back pain, gait problem and myalgias.  All other systems reviewed and are negative.      Allergies  Morphine  Home Medications   Current Outpatient Rx  Name  Route  Sig  Dispense  Refill  . ALPRAZolam (XANAX) 0.5 MG tablet      Take up to one and 1/2 tabs by mouth per day as needed for nerves; to fill Mar 13, 2013   45 tablet   5   . amphetamine-dextroamphetamine (ADDERALL) 30 MG tablet      1.5 tab by mouth in the AM, and 1/2 tab in the PM - to fill Jan 26, 2014   60 tablet   0   . HYDROcodone-acetaminophen (NORCO) 10-325 MG per tablet   Oral   Take 1-2 tablets by mouth every  6 (six) hours as needed. To fill Aug 22, 2013   120 tablet   5   . ibuprofen (ADVIL,MOTRIN) 600 MG tablet   Oral   Take 1 tablet (600 mg total) by mouth every 8 (eight) hours as needed for pain.   90 tablet   0   . EXPIRED: estradiol (ESTRACE) 1 MG tablet   Oral   Take 1 tablet (1 mg total) by mouth daily.   90 tablet   3    BP 129/82  Pulse 67  Temp(Src) 98 F (36.7 C) (Oral)  Resp 16  Wt 126 lb (57.153 kg)  SpO2 99% Physical Exam  Nursing note and vitals reviewed. Constitutional: She is oriented to person, place, and time. She appears well-developed and well-nourished. No distress.  HENT:  Head: Normocephalic and atraumatic.  Eyes: EOM are normal. Pupils are equal, round, and reactive to light.  Neck:  Normal range of motion. Neck supple. No tracheal deviation present.  Cardiovascular: Normal rate.   Pulmonary/Chest: Effort normal. No respiratory distress.  Abdominal: Soft. She exhibits no distension.  Musculoskeletal: Normal range of motion. She exhibits tenderness.  No midline spinal tenderness to palpation Right lumbar paraspinal tenderness to palpation  Neurological: She is alert and oriented to person, place, and time.  Lower extremity strength is intact bilaterally.     Skin: Skin is warm and dry.  Psychiatric: She has a normal mood and affect. Her behavior is normal.    ED Course  Procedures (including critical care time) DIAGNOSTIC STUDIES: Oxygen Saturation is 99% on room air, normal by my interpretation.    COORDINATION OF CARE:  2:04 PM Discussed course of care with pt which includes pain medication. Pt understands and agrees.    Labs Review Labs Reviewed - No data to display Imaging Review No results found.   EKG Interpretation None      MDM   Final diagnoses:  Chronic back pain    2:12 PM Patient will have decadron injection and prescription for PO prednisone. Patient will have vicodin. No bladder/bowel incontinence or saddle paresthesias.   I personally performed the services described in this documentation, which was scribed in my presence. The recorded information has been reviewed and is accurate.    Emilia BeckKaitlyn Matthan Sledge, PA-C 02/13/14 1413

## 2014-02-13 NOTE — ED Provider Notes (Signed)
Medical screening examination/treatment/procedure(s) were performed by non-physician practitioner and as supervising physician I was immediately available for consultation/collaboration.   EKG Interpretation None       Ethelda ChickMartha K Linker, MD 02/13/14 1414

## 2014-02-13 NOTE — ED Notes (Signed)
Pt reports recurrent pain in low back due to "bulging disc"

## 2014-02-14 ENCOUNTER — Ambulatory Visit: Payer: 59 | Admitting: Physician Assistant

## 2014-02-15 ENCOUNTER — Encounter: Payer: Self-pay | Admitting: Family Medicine

## 2014-02-15 ENCOUNTER — Ambulatory Visit (INDEPENDENT_AMBULATORY_CARE_PROVIDER_SITE_OTHER): Payer: 59 | Admitting: Family Medicine

## 2014-02-15 VITALS — BP 120/70 | HR 72 | Temp 98.0°F | Resp 20 | Ht 65.0 in | Wt 127.1 lb

## 2014-02-15 DIAGNOSIS — M549 Dorsalgia, unspecified: Secondary | ICD-10-CM

## 2014-02-15 MED ORDER — IBUPROFEN 600 MG PO TABS: 600.0000 mg | ORAL_TABLET | Freq: Three times a day (TID) | ORAL | Status: DC | PRN

## 2014-02-15 MED ORDER — PREDNISONE 10 MG PO TABS: ORAL_TABLET | ORAL | Status: DC

## 2014-02-15 NOTE — Progress Notes (Signed)
  Subjective:    Kathy HaberChristine Vargas is a 63 y.o. female who presents for follow up of low back problems. Current symptoms include: numbness in R hip and pain in and low back (sharp and throbbing in character; 8/10 in severity). Symptoms have significantly worsened from the previous visit. Exacerbating factors identified by the patient are sitting, standing and walking.  Pain radiating from R hip to thigh.  Standing for any sig time bothers her.    The following portions of the patient's history were reviewed and updated as appropriate: allergies, current medications, past family history, past medical history, past social history, past surgical history and problem list.    Objective:    BP 120/70  Pulse 72  Temp(Src) 98 F (36.7 C) (Oral)  Resp 20  Ht 5\' 5"  (1.651 m)  Wt 127 lb 1.9 oz (57.661 kg)  BMI 21.15 kg/m2  SpO2 97% General appearance: alert, cooperative and appears stated age Extremities: extremities normal, atraumatic, no cyanosis or edema Neurologic: Alert and oriented X 3, normal strength and tone. Normal symmetric reflexes. Normal coordination and gait    Assessment:    Nonspecific acute low back pain    Plan:    Educational material distributed. Short (2-4 day) period of relative rest recommended until acute symptoms improve. Ice to affected area as needed for local pain relief. Heat to affected area as needed for local pain relief. NSAIDs per medication orders. pt refused muscle relaxer---f/u pcp next week

## 2014-02-15 NOTE — Progress Notes (Signed)
Pre-visit discussion using our clinic review tool. No additional management support is needed unless otherwise documented below in the visit note.  

## 2014-02-15 NOTE — Patient Instructions (Signed)
Back Pain, Adult Low back pain is very common. About 1 in 5 people have back pain.The cause of low back pain is rarely dangerous. The pain often gets better over time.About half of people with a sudden onset of back pain feel better in just 2 weeks. About 8 in 10 people feel better by 6 weeks.  CAUSES Some common causes of back pain include:  Strain of the muscles or ligaments supporting the spine.  Wear and tear (degeneration) of the spinal discs.  Arthritis.  Direct injury to the back. DIAGNOSIS Most of the time, the direct cause of low back pain is not known.However, back pain can be treated effectively even when the exact cause of the pain is unknown.Answering your caregiver's questions about your overall health and symptoms is one of the most accurate ways to make sure the cause of your pain is not dangerous. If your caregiver needs more information, he or she may order lab work or imaging tests (X-rays or MRIs).However, even if imaging tests show changes in your back, this usually does not require surgery. HOME CARE INSTRUCTIONS For many people, back pain returns.Since low back pain is rarely dangerous, it is often a condition that people can learn to manageon their own.   Remain active. It is stressful on the back to sit or stand in one place. Do not sit, drive, or stand in one place for more than 30 minutes at a time. Take short walks on level surfaces as soon as pain allows.Try to increase the length of time you walk each day.  Do not stay in bed.Resting more than 1 or 2 days can delay your recovery.  Do not avoid exercise or work.Your body is made to move.It is not dangerous to be active, even though your back may hurt.Your back will likely heal faster if you return to being active before your pain is gone.  Pay attention to your body when you bend and lift. Many people have less discomfortwhen lifting if they bend their knees, keep the load close to their bodies,and  avoid twisting. Often, the most comfortable positions are those that put less stress on your recovering back.  Find a comfortable position to sleep. Use a firm mattress and lie on your side with your knees slightly bent. If you lie on your back, put a pillow under your knees.  Only take over-the-counter or prescription medicines as directed by your caregiver. Over-the-counter medicines to reduce pain and inflammation are often the most helpful.Your caregiver may prescribe muscle relaxant drugs.These medicines help dull your pain so you can more quickly return to your normal activities and healthy exercise.  Put ice on the injured area.  Put ice in a plastic bag.  Place a towel between your skin and the bag.  Leave the ice on for 15-20 minutes, 03-04 times a day for the first 2 to 3 days. After that, ice and heat may be alternated to reduce pain and spasms.  Ask your caregiver about trying back exercises and gentle massage. This may be of some benefit.  Avoid feeling anxious or stressed.Stress increases muscle tension and can worsen back pain.It is important to recognize when you are anxious or stressed and learn ways to manage it.Exercise is a great option. SEEK MEDICAL CARE IF:  You have pain that is not relieved with rest or medicine.  You have pain that does not improve in 1 week.  You have new symptoms.  You are generally not feeling well. SEEK   IMMEDIATE MEDICAL CARE IF:   You have pain that radiates from your back into your legs.  You develop new bowel or bladder control problems.  You have unusual weakness or numbness in your arms or legs.  You develop nausea or vomiting.  You develop abdominal pain.  You feel faint. Document Released: 11/07/2005 Document Revised: 05/08/2012 Document Reviewed: 03/28/2011 [employer] Patient Information 2014 [employer].  

## 2014-02-18 ENCOUNTER — Other Ambulatory Visit (INDEPENDENT_AMBULATORY_CARE_PROVIDER_SITE_OTHER): Payer: 59

## 2014-02-18 DIAGNOSIS — N10 Acute tubulo-interstitial nephritis: Secondary | ICD-10-CM

## 2014-02-18 LAB — URINALYSIS, ROUTINE W REFLEX MICROSCOPIC
BILIRUBIN URINE: NEGATIVE
HGB URINE DIPSTICK: NEGATIVE
KETONES UR: NEGATIVE
Leukocytes, UA: NEGATIVE
Nitrite: NEGATIVE
RBC / HPF: NONE SEEN (ref 0–?)
Total Protein, Urine: NEGATIVE
URINE GLUCOSE: NEGATIVE
UROBILINOGEN UA: 0.2 (ref 0.0–1.0)
pH: 6 (ref 5.0–8.0)

## 2014-02-19 ENCOUNTER — Ambulatory Visit (INDEPENDENT_AMBULATORY_CARE_PROVIDER_SITE_OTHER): Payer: 59 | Admitting: Internal Medicine

## 2014-02-19 ENCOUNTER — Encounter: Payer: Self-pay | Admitting: Internal Medicine

## 2014-02-19 VITALS — BP 128/78 | HR 87 | Temp 98.9°F | Ht 65.0 in | Wt 127.4 lb

## 2014-02-19 DIAGNOSIS — Z0000 Encounter for general adult medical examination without abnormal findings: Secondary | ICD-10-CM

## 2014-02-19 DIAGNOSIS — M545 Low back pain, unspecified: Secondary | ICD-10-CM

## 2014-02-19 DIAGNOSIS — G894 Chronic pain syndrome: Secondary | ICD-10-CM

## 2014-02-19 MED ORDER — HYDROCODONE-ACETAMINOPHEN 10-325 MG PO TABS: 1.0000 | ORAL_TABLET | Freq: Four times a day (QID) | ORAL | Status: DC | PRN

## 2014-02-19 MED ORDER — AMPHETAMINE-DEXTROAMPHETAMINE 30 MG PO TABS: ORAL_TABLET | ORAL | Status: DC

## 2014-02-19 MED ORDER — ALPRAZOLAM 0.5 MG PO TABS: ORAL_TABLET | ORAL | Status: DC

## 2014-02-19 MED ORDER — METHYLPREDNISOLONE ACETATE 80 MG/ML IJ SUSP
80.0000 mg | Freq: Once | INTRAMUSCULAR | Status: AC
  Administered 2014-02-19: 80 mg via INTRAMUSCULAR

## 2014-02-19 NOTE — Patient Instructions (Signed)
You had the steroid shot today  Please continue all other medications as before, and refills have been done if requested. Please have the pharmacy call with any other refills you may need.  You are given the letter today explaining the transitional pain medication refill policy  Please be aware that I will no longer be able to offer monthly refills of any Schedule II or higher medications  You will be contacted regarding the referral for: pain clinic  Please go to the LAB in the Basement (turn left off the elevator) for the tests to be done today You will be contacted by phone if any changes need to be made immediately.  Otherwise, you will receive a letter about your results with an explanation, but please check with MyChart first.  Please return in 6 months, or sooner if needed

## 2014-02-19 NOTE — Progress Notes (Signed)
Subjective:    Patient ID: Kathy HaberChristine Suastegui, female    DOB: 02/25/1951, 63 y.o.   MRN: [id number]  HPI  Here for wellness and f/u;  Overall doing ok;  Pt denies CP, worsening SOB, DOE, wheezing, orthopnea, PND, worsening LE edema, palpitations, dizziness or syncope.  Pt denies neurological change such as new headache, facial or extremity weakness.  Pt denies polydipsia, polyuria, or low sugar symptoms. Pt states overall good compliance with treatment and medications, good tolerability, and has been trying to follow lower cholesterol diet.  Pt denies worsening depressive symptoms, suicidal ideation or panic. No fever, night sweats, wt loss, loss of appetite, or other constitutional symptoms.  Pt states good ability with ADL's, has low fall risk, home safety reviewed and adequate, no other significant changes in hearing or vision, and only occasionally active with exercise.  Did see [facility] with acute right LBP, rx vicodin but did not take, did take the prednisone (gives me the vicodin rx at visit today).  Sees Dermatology Dr Terri PiedraLupton.  Past Medical History  Diagnosis Date  . ABDOMINAL TENDERNESS 01/21/2011  . ACUTE GINGIVITIS NONPLAQUE INDUCED 06/11/2010  . ADD 09/05/2008  . Alcohol abuse, in remission 09/27/2007  . ALLERGIC RHINITIS 03/02/2009  . ANXIETY 07/02/2007  . BACK PAIN 05/19/2009  . CHRONIC OBSTRUCTIVE PULMONARY DISEASE, ACUTE EXACERBATION 08/06/2009  . COPD 07/02/2007  . DEGENERATIVE JOINT DISEASE, CERVICAL SPINE 07/02/2007  . DEPRESSION 07/02/2007  . GASTROENTERITIS, VIRAL 11/29/2010  . GERD 01/21/2011  . Headache(784.0) 07/02/2007  . HYPOTHYROIDISM 07/02/2007  . OSTEOARTHRITIS 07/02/2007  . PAIN, CHRONIC, DUE TO TRAUMA 07/02/2007  . PELVIC  PAIN 03/29/2010  . Other and unspecified ovarian cyst 05/19/2009  . UTI 05/19/2009  . Vitamin D insufficiency   . Lumbar disc disease   . Lumbar pain with radiation down right leg 06/01/2011   Past Surgical History  Procedure Laterality Date  . Abdominal  hysterectomy    . Cholecystectomy      reports that she has been smoking.  She does not have any smokeless tobacco history on file. She reports that she does not drink alcohol or use illicit drugs. family history includes Alcohol abuse in her other; Anxiety disorder in her other; Coronary artery disease in her other; Depression in her other; Stroke in her other. Allergies  Allergen Reactions  . Morphine Nausea And Vomiting   Current Outpatient Prescriptions on File Prior to Visit  Medication Sig Dispense Refill  . estradiol (ESTRACE) 1 MG tablet Take 1 tablet (1 mg total) by mouth daily.  90 tablet  3  . ibuprofen (ADVIL,MOTRIN) 600 MG tablet Take 1 tablet (600 mg total) by mouth every 8 (eight) hours as needed.  90 tablet  0  . [DISCONTINUED] FLUoxetine (PROZAC) 20 MG capsule Take 1 capsule (20 mg total) by mouth daily.  30 capsule  11   No current facility-administered medications on file prior to visit.    Review of Systems Constitutional: Negative for diaphoresis, activity change, appetite change or unexpected weight change.  HENT: Negative for hearing loss, ear pain, facial swelling, mouth sores and neck stiffness.   Eyes: Negative for pain, redness and visual disturbance.  Respiratory: Negative for shortness of breath and wheezing.   Cardiovascular: Negative for chest pain and palpitations.  Gastrointestinal: Negative for diarrhea, blood in stool, abdominal distention or other pain Genitourinary: Negative for hematuria, flank pain or change in urine volume.  Musculoskeletal: Negative for myalgias and joint swelling.  Skin: Negative for color change and  wound.  Neurological: Negative for syncope and numbness. other than noted Hematological: Negative for adenopathy.  Psychiatric/Behavioral: Negative for hallucinations, self-injury, decreased concentration and agitation.      Objective:   Physical Exam BP 128/78  Pulse 87  Temp(Src) 98.9 F (37.2 C) (Oral)  Ht 5\' 5"  (1.651  m)  Wt 127 lb 6 oz (57.777 kg)  BMI 21.20 kg/m2  SpO2 98% VS noted,  Constitutional: Pt is oriented to person, place, and time. Appears well-developed and well-nourished.  Head: Normocephalic and atraumatic.  Right Ear: External ear normal.  Left Ear: External ear normal.  Nose: Nose normal.  Mouth/Throat: Oropharynx is clear and moist.  Eyes: Conjunctivae and EOM are normal. Pupils are equal, round, and reactive to light.  Neck: Normal range of motion. Neck supple. No JVD present. No tracheal deviation present.  Cardiovascular: Normal rate, regular rhythm, normal heart sounds and intact distal pulses.   Pulmonary/Chest: Effort normal and breath sounds normal.  Abdominal: Soft. Bowel sounds are normal. There is no tenderness. No HSM  Musculoskeletal: Normal range of motion. Exhibits no edema.  Lymphadenopathy:  Has no cervical adenopathy.  Neurological: Pt is alert and oriented to person, place, and time. Pt has normal reflexes. No cranial nerve deficit. Motor 5/5 Skin: Skin is warm and dry. No rash noted.  Psychiatric:  Has  normal mood and affect. Behavior is normal.     Assessment & Plan:

## 2014-02-19 NOTE — Progress Notes (Signed)
Pre visit review using our clinic review tool, if applicable. No additional management support is needed unless otherwise documented below in the visit note. 

## 2014-02-20 ENCOUNTER — Ambulatory Visit: Payer: 59 | Admitting: Internal Medicine

## 2014-02-21 DIAGNOSIS — G894 Chronic pain syndrome: Secondary | ICD-10-CM | POA: Insufficient documentation

## 2014-02-21 NOTE — Assessment & Plan Note (Signed)

## 2014-02-21 NOTE — Assessment & Plan Note (Signed)
For depomedrol IM, f/u surgury as planned

## 2014-02-21 NOTE — Assessment & Plan Note (Signed)
For bridge meds refill, refer pain clinic

## 2014-03-04 ENCOUNTER — Encounter (HOSPITAL_COMMUNITY): Payer: Self-pay | Admitting: Emergency Medicine

## 2014-03-04 ENCOUNTER — Emergency Department (HOSPITAL_COMMUNITY)
Admission: EM | Admit: 2014-03-04 | Discharge: 2014-03-04 | Disposition: A | Discharge status: Home / Self Care | Payer: 59 | Attending: Emergency Medicine | Admitting: Emergency Medicine

## 2014-03-04 DIAGNOSIS — Z8619 Personal history of other infectious and parasitic diseases: Secondary | ICD-10-CM | POA: Insufficient documentation

## 2014-03-04 DIAGNOSIS — Z8639 Personal history of other endocrine, nutritional and metabolic disease: Secondary | ICD-10-CM | POA: Insufficient documentation

## 2014-03-04 DIAGNOSIS — L03818 Cellulitis of other sites: Principal | ICD-10-CM

## 2014-03-04 DIAGNOSIS — F411 Generalized anxiety disorder: Secondary | ICD-10-CM | POA: Insufficient documentation

## 2014-03-04 DIAGNOSIS — J4489 Other specified chronic obstructive pulmonary disease: Secondary | ICD-10-CM | POA: Insufficient documentation

## 2014-03-04 DIAGNOSIS — Z8719 Personal history of other diseases of the digestive system: Secondary | ICD-10-CM | POA: Insufficient documentation

## 2014-03-04 DIAGNOSIS — G8929 Other chronic pain: Secondary | ICD-10-CM | POA: Insufficient documentation

## 2014-03-04 DIAGNOSIS — F329 Major depressive disorder, single episode, unspecified: Secondary | ICD-10-CM | POA: Insufficient documentation

## 2014-03-04 DIAGNOSIS — Z87440 Personal history of urinary (tract) infections: Secondary | ICD-10-CM | POA: Insufficient documentation

## 2014-03-04 DIAGNOSIS — Z862 Personal history of diseases of the blood and blood-forming organs and certain disorders involving the immune mechanism: Secondary | ICD-10-CM | POA: Insufficient documentation

## 2014-03-04 DIAGNOSIS — F988 Other specified behavioral and emotional disorders with onset usually occurring in childhood and adolescence: Secondary | ICD-10-CM | POA: Insufficient documentation

## 2014-03-04 DIAGNOSIS — L02811 Cutaneous abscess of head [any part, except face]: Secondary | ICD-10-CM

## 2014-03-04 DIAGNOSIS — Z79899 Other long term (current) drug therapy: Secondary | ICD-10-CM | POA: Insufficient documentation

## 2014-03-04 DIAGNOSIS — J449 Chronic obstructive pulmonary disease, unspecified: Secondary | ICD-10-CM | POA: Insufficient documentation

## 2014-03-04 DIAGNOSIS — F17200 Nicotine dependence, unspecified, uncomplicated: Secondary | ICD-10-CM | POA: Insufficient documentation

## 2014-03-04 DIAGNOSIS — F3289 Other specified depressive episodes: Secondary | ICD-10-CM | POA: Insufficient documentation

## 2014-03-04 DIAGNOSIS — M479 Spondylosis, unspecified: Secondary | ICD-10-CM | POA: Insufficient documentation

## 2014-03-04 DIAGNOSIS — L02818 Cutaneous abscess of other sites: Secondary | ICD-10-CM | POA: Insufficient documentation

## 2014-03-04 DIAGNOSIS — M1990 Unspecified osteoarthritis, unspecified site: Secondary | ICD-10-CM | POA: Insufficient documentation

## 2014-03-04 MED ORDER — HYDROCODONE-ACETAMINOPHEN 5-325 MG PO TABS: 2.0000 | ORAL_TABLET | ORAL | Status: DC | PRN

## 2014-03-04 MED ORDER — PREDNISONE 10 MG PO TABS: 20.0000 mg | ORAL_TABLET | Freq: Every day | ORAL | Status: DC

## 2014-03-04 MED ORDER — VANCOMYCIN HCL 10 G IV SOLR
1250.0000 mg | Freq: Once | INTRAVENOUS | Status: AC
  Administered 2014-03-04: 1250 mg via INTRAVENOUS
  Filled 2014-03-04: qty 1250

## 2014-03-04 MED ORDER — ONDANSETRON HCL 4 MG/2ML IJ SOLN
4.0000 mg | Freq: Once | INTRAMUSCULAR | Status: AC
  Administered 2014-03-04: 4 mg via INTRAVENOUS
  Filled 2014-03-04: qty 2

## 2014-03-04 MED ORDER — AMOXICILLIN-POT CLAVULANATE 875-125 MG PO TABS: 1.0000 | ORAL_TABLET | Freq: Two times a day (BID) | ORAL | Status: DC

## 2014-03-04 MED ORDER — SULFAMETHOXAZOLE-TRIMETHOPRIM 800-160 MG PO TABS: 2.0000 | ORAL_TABLET | Freq: Two times a day (BID) | ORAL | Status: DC

## 2014-03-04 MED ORDER — DEXAMETHASONE SODIUM PHOSPHATE 10 MG/ML IJ SOLN
10.0000 mg | Freq: Once | INTRAMUSCULAR | Status: AC
  Administered 2014-03-04: 10 mg via INTRAVENOUS
  Filled 2014-03-04: qty 1

## 2014-03-04 MED ORDER — HYDROMORPHONE HCL PF 1 MG/ML IJ SOLN
1.0000 mg | INTRAMUSCULAR | Status: AC | PRN
  Administered 2014-03-04 (×2): 1 mg via INTRAVENOUS
  Filled 2014-03-04 (×2): qty 1

## 2014-03-04 NOTE — ED Notes (Signed)
Pt c/o infected wound on top of head and facial swelling starting today.  Pain score 7/10.  Pt reports that the wound has progressed in size x "past several days" and began hurting x 2 hours ago.

## 2014-03-04 NOTE — Discharge Instructions (Signed)
Recheck here in 48 hours for gauze removal and reevaluation.  Abscess An abscess is an infected area that contains a collection of pus and debris.It can occur in almost any part of the body. An abscess is also known as a furuncle or boil. CAUSES  An abscess occurs when tissue gets infected. This can occur from blockage of oil or sweat glands, infection of hair follicles, or a minor injury to the skin. As the body tries to fight the infection, pus collects in the area and creates pressure under the skin. This pressure causes pain. People with weakened immune systems have difficulty fighting infections and get certain abscesses more often.  SYMPTOMS Usually an abscess develops on the skin and becomes a painful mass that is red, warm, and tender. If the abscess forms under the skin, you may feel a moveable soft area under the skin. Some abscesses break open (rupture) on their own, but most will continue to get worse without care. The infection can spread deeper into the body and eventually into the bloodstream, causing you to feel ill.  DIAGNOSIS  Your caregiver will take your medical history and perform a physical exam. A sample of fluid may also be taken from the abscess to determine what is causing your infection. TREATMENT  Your caregiver may prescribe antibiotic medicines to fight the infection. However, taking antibiotics alone usually does not cure an abscess. Your caregiver may need to make a small cut (incision) in the abscess to drain the pus. In some cases, gauze is packed into the abscess to reduce pain and to continue draining the area. HOME CARE INSTRUCTIONS   Only take over-the-counter or prescription medicines for pain, discomfort, or fever as directed by your caregiver.  If you were prescribed antibiotics, take them as directed. Finish them even if you start to feel better.  If gauze is used, follow your caregiver's directions for changing the gauze.  To avoid spreading the  infection:  Keep your draining abscess covered with a bandage.  Wash your hands well.  Do not share personal care items, towels, or whirlpools with others.  Avoid skin contact with others.  Keep your skin and clothes clean around the abscess.  Keep all follow-up appointments as directed by your caregiver. SEEK MEDICAL CARE IF:   You have increased pain, swelling, redness, fluid drainage, or bleeding.  You have muscle aches, chills, or a general ill feeling.  You have a fever. MAKE SURE YOU:   Understand these instructions.  Will watch your condition.  Will get help right away if you are not doing well or get worse. Document Released: 08/17/2005 Document Revised: 05/08/2012 Document Reviewed: 01/20/2012 [facility] Patient Information 2014 [place], [place].

## 2014-03-04 NOTE — ED Provider Notes (Signed)
CSN: [id number]     Arrival date & time 03/04/14  1549 History   First MD Initiated Contact with Patient 03/04/14 1624     Chief Complaint  Patient presents with  . Wound Infection  . Facial Swelling      HPI  She presents with an infection of her scalp and swelling to her face. She states about every days ago she noticed redness in the midline scalp. She states it was a tender nose washing brushing her hair. 40 she states she waking her forehead and above her eyes was swollen. Worked all day. Presents here with above complaint fever. No vision changes. No injury to the area. No other areas of skin abnormalities or prior similar abnormalities.  No history of MRSA.  Past Medical History  Diagnosis Date  . ABDOMINAL TENDERNESS 01/21/2011  . ACUTE GINGIVITIS NONPLAQUE INDUCED 06/11/2010  . ADD 09/05/2008  . Alcohol abuse, in remission 09/27/2007  . ALLERGIC RHINITIS 03/02/2009  . ANXIETY 07/02/2007  . BACK PAIN 05/19/2009  . CHRONIC OBSTRUCTIVE PULMONARY DISEASE, ACUTE EXACERBATION 08/06/2009  . COPD 07/02/2007  . DEGENERATIVE JOINT DISEASE, CERVICAL SPINE 07/02/2007  . DEPRESSION 07/02/2007  . GASTROENTERITIS, VIRAL 11/29/2010  . GERD 01/21/2011  . Headache(784.0) 07/02/2007  . HYPOTHYROIDISM 07/02/2007  . OSTEOARTHRITIS 07/02/2007  . PAIN, CHRONIC, DUE TO TRAUMA 07/02/2007  . PELVIC  PAIN 03/29/2010  . Other and unspecified ovarian cyst 05/19/2009  . UTI 05/19/2009  . Vitamin D insufficiency   . Lumbar disc disease   . Lumbar pain with radiation down right leg 06/01/2011   Past Surgical History  Procedure Laterality Date  . Abdominal hysterectomy    . Cholecystectomy     Family History  Problem Relation Age of Onset  . Alcohol abuse Other   . Anxiety disorder Other   . Coronary artery disease Other   . Depression Other   . Stroke Other    History  Substance Use Topics  . Smoking status: Current Every Day Smoker  . Smokeless tobacco: Not on file  . Alcohol Use: No   OB History   Grav Para Term Preterm Abortions TAB SAB Ect Mult Living                 Review of Systems  Constitutional: Negative for fever, chills, diaphoresis, appetite change and fatigue.  HENT: Negative for mouth sores, sore throat and trouble swallowing.   Eyes: Negative for visual disturbance.  Respiratory: Negative for cough, chest tightness, shortness of breath and wheezing.   Cardiovascular: Negative for chest pain.  Gastrointestinal: Negative for nausea, vomiting, abdominal pain, diarrhea and abdominal distention.  Endocrine: Negative for polydipsia, polyphagia and polyuria.  Genitourinary: Negative for dysuria, frequency and hematuria.  Musculoskeletal: Negative for gait problem.  Skin: Negative for color change, pallor and rash.       Pain and erythema localized to the area about the size of a quarter on her scalp. Soft tissue swelling above her eyes and bridge of her nose.  Neurological: Negative for dizziness, syncope, light-headedness and headaches.  Hematological: Does not bruise/bleed easily.  Psychiatric/Behavioral: Negative for behavioral problems and confusion.      Allergies  Morphine  Home Medications   Prior to Admission medications   Medication Sig Start Date End Date Taking? Authorizing Provider  ALPRAZolam Prudy Feeler(XANAX) 0.5 MG tablet Take 0.5 mg by mouth at bedtime as needed for anxiety. 02/19/14  Yes Corwin LevinsJames W John, MD  amphetamine-dextroamphetamine (ADDERALL) 30 MG tablet Take 15-45 mg by mouth  2 (two) times daily. 1.5 tab by mouth in the AM, and 1/2 tab in the PM - to fill Apr 20, 2014 02/19/14  Yes Corwin LevinsJames W John, MD  diphenhydrAMINE (BENADRYL) 25 mg capsule Take 25 mg by mouth every 6 (six) hours as needed for allergies.   Yes Historical Provider, MD  HYDROcodone-acetaminophen (NORCO) 10-325 MG per tablet Take 1 tablet by mouth every 6 (six) hours as needed for moderate pain. To fill Apr 20, 2014 02/19/14  Yes Corwin LevinsJames W John, MD  ibuprofen (ADVIL,MOTRIN) 600 MG tablet Take 600 mg by  mouth every 8 (eight) hours as needed for moderate pain. 02/15/14  Yes Lelon PerlaYvonne R Lowne, DO  estradiol (ESTRACE) 1 MG tablet Take 1 tablet (1 mg total) by mouth daily. 10/11/12 10/11/13  Corwin LevinsJames W John, MD   BP 126/63  Pulse 62  Temp(Src) 98.5 F (36.9 C) (Oral)  Resp 16  Ht 5\' 5"  (1.651 m)  Wt 127 lb 6.8 oz (57.8 kg)  BMI 21.20 kg/m2  SpO2 100% Physical Exam  Constitutional: She is oriented to person, place, and time. She appears well-developed and well-nourished. No distress.  HENT:  Head: Normocephalic.    The soft tissue swelling of the face is brawny and edematous. Not fluctuant. Very well localized area of fluctuance overlying the pustule. If that ultrasound shows subcutaneous fluid collection underlying the area of pustule, small. No additional areas of subcutaneous fluid collections noted.  Eyes: Conjunctivae are normal. Pupils are equal, round, and reactive to light. No scleral icterus.  Neck: Normal range of motion. Neck supple. No thyromegaly present.  Cardiovascular: Normal rate and regular rhythm.  Exam reveals no gallop and no friction rub.   No murmur heard. Pulmonary/Chest: Effort normal and breath sounds normal. No respiratory distress. She has no wheezes. She has no rales.  Abdominal: Soft. Bowel sounds are normal. She exhibits no distension. There is no tenderness. There is no rebound.  Musculoskeletal: Normal range of motion.  Neurological: She is alert and oriented to person, place, and time.  Skin: Skin is warm and dry. No rash noted.  Psychiatric: She has a normal mood and affect. Her behavior is normal.    ED Course  INCISION AND DRAINAGE Date/Time: 03/04/2014 6:25 PM Performed by: Rolland PorterJAMES, Kassadee Carawan Authorized by: Rolland PorterJAMES, Tyjae Shvartsman Consent: Verbal consent obtained. written consent obtained. Risks and benefits: risks, benefits and alternatives were discussed Consent given by: patient Patient understanding: patient states understanding of the procedure being  performed Patient identity confirmed: verbally with patient Type: abscess Body area: head/neck Location details: scalp Anesthesia: local infiltration Local anesthetic: lidocaine 2% with epinephrine Anesthetic total: 6 ml Patient sedated: no Scalpel size: 11 Needle gauge: 25. Incision type: single straight Complexity: simple Drainage: purulent Drainage amount: scant Packing material: 1/4 in iodoform gauze Patient tolerance: Patient tolerated the procedure well with no immediate complications.   (including critical care time) Labs Review Labs Reviewed - No data to display  Imaging Review No results found.   EKG Interpretation None      MDM   Final diagnoses:  Scalp abscess    Plan. As the patient leave the gauze in place. Vascular recheck in 48 hours. Given prescriptions for Augmentin and Bactrim. Also 5 days prednisone, Vicodin for pain. Recheck if any worsening symptoms.  On exam she has a well localized abscess that has been drained and some sympathetic swelling into the face that is not abscess or fluid collection clinically.    Rolland PorterMark Henson Fraticelli, MD 03/04/14 [phone]

## 2014-03-05 ENCOUNTER — Ambulatory Visit: Payer: 59 | Admitting: Internal Medicine

## 2014-03-07 ENCOUNTER — Emergency Department (HOSPITAL_COMMUNITY)
Admission: EM | Admit: 2014-03-07 | Discharge: 2014-03-07 | Disposition: A | Discharge status: Home / Self Care | Payer: 59 | Attending: Emergency Medicine | Admitting: Emergency Medicine

## 2014-03-07 DIAGNOSIS — Z5189 Encounter for other specified aftercare: Secondary | ICD-10-CM

## 2014-03-07 DIAGNOSIS — M1990 Unspecified osteoarthritis, unspecified site: Secondary | ICD-10-CM | POA: Insufficient documentation

## 2014-03-07 DIAGNOSIS — IMO0002 Reserved for concepts with insufficient information to code with codable children: Secondary | ICD-10-CM | POA: Insufficient documentation

## 2014-03-07 DIAGNOSIS — Z87440 Personal history of urinary (tract) infections: Secondary | ICD-10-CM | POA: Insufficient documentation

## 2014-03-07 DIAGNOSIS — Z862 Personal history of diseases of the blood and blood-forming organs and certain disorders involving the immune mechanism: Secondary | ICD-10-CM | POA: Insufficient documentation

## 2014-03-07 DIAGNOSIS — J4489 Other specified chronic obstructive pulmonary disease: Secondary | ICD-10-CM | POA: Insufficient documentation

## 2014-03-07 DIAGNOSIS — J449 Chronic obstructive pulmonary disease, unspecified: Secondary | ICD-10-CM | POA: Insufficient documentation

## 2014-03-07 DIAGNOSIS — F988 Other specified behavioral and emotional disorders with onset usually occurring in childhood and adolescence: Secondary | ICD-10-CM | POA: Insufficient documentation

## 2014-03-07 DIAGNOSIS — Z79899 Other long term (current) drug therapy: Secondary | ICD-10-CM | POA: Insufficient documentation

## 2014-03-07 DIAGNOSIS — Z8742 Personal history of other diseases of the female genital tract: Secondary | ICD-10-CM | POA: Insufficient documentation

## 2014-03-07 DIAGNOSIS — Z792 Long term (current) use of antibiotics: Secondary | ICD-10-CM | POA: Insufficient documentation

## 2014-03-07 DIAGNOSIS — Z8639 Personal history of other endocrine, nutritional and metabolic disease: Secondary | ICD-10-CM | POA: Insufficient documentation

## 2014-03-07 DIAGNOSIS — F411 Generalized anxiety disorder: Secondary | ICD-10-CM | POA: Insufficient documentation

## 2014-03-07 DIAGNOSIS — F329 Major depressive disorder, single episode, unspecified: Secondary | ICD-10-CM | POA: Insufficient documentation

## 2014-03-07 DIAGNOSIS — F17200 Nicotine dependence, unspecified, uncomplicated: Secondary | ICD-10-CM | POA: Insufficient documentation

## 2014-03-07 DIAGNOSIS — Z8619 Personal history of other infectious and parasitic diseases: Secondary | ICD-10-CM | POA: Insufficient documentation

## 2014-03-07 DIAGNOSIS — Z8719 Personal history of other diseases of the digestive system: Secondary | ICD-10-CM | POA: Insufficient documentation

## 2014-03-07 DIAGNOSIS — F3289 Other specified depressive episodes: Secondary | ICD-10-CM | POA: Insufficient documentation

## 2014-03-07 DIAGNOSIS — Z4800 Encounter for change or removal of nonsurgical wound dressing: Secondary | ICD-10-CM | POA: Insufficient documentation

## 2014-03-07 NOTE — ED Provider Notes (Signed)
Medical screening examination/treatment/procedure(s) were performed by non-physician practitioner and as supervising physician I was immediately available for consultation/collaboration.   EKG Interpretation None        Pascal Stiggers, MD 03/07/14 2136 

## 2014-03-07 NOTE — ED Notes (Signed)
Patient had infected wound on crown of head on Wednesday, was seen here in ED, had dressing packed, needs packing removed.

## 2014-03-07 NOTE — ED Provider Notes (Signed)
CSN: [id number]     Arrival date & time 03/07/14  1633 History  This chart was scribed for non-physician practitioner Emilia BeckKaitlyn Roselina Burgueno, working with Gwyneth SproutWhitney Plunkett, MD by Carl Bestelina Holson, ED Scribe. This patient was seen in room WTR8/WTR8 and the patient's care was started at 6:02 PM.    Chief Complaint  Patient presents with  . Wound Check    Patient is a 63 y.o. female presenting with wound check. The history is provided by the patient. No language interpreter was used.  Wound Check   HPI Comments: Kathy HaberChristine Renaldo is a 63 y.o. female who presents to the Emergency Department for a check of an infected wound on the crown of her head.  The patient was seen in the ED on Wednesday and had the wound dressed and packed.  She states that the pain has decreased since she was last seen but the area is still itchy.  The patient states that she has been taking her antibiotics.     Past Medical History  Diagnosis Date  . ABDOMINAL TENDERNESS 01/21/2011  . ACUTE GINGIVITIS NONPLAQUE INDUCED 06/11/2010  . ADD 09/05/2008  . Alcohol abuse, in remission 09/27/2007  . ALLERGIC RHINITIS 03/02/2009  . ANXIETY 07/02/2007  . BACK PAIN 05/19/2009  . CHRONIC OBSTRUCTIVE PULMONARY DISEASE, ACUTE EXACERBATION 08/06/2009  . COPD 07/02/2007  . DEGENERATIVE JOINT DISEASE, CERVICAL SPINE 07/02/2007  . DEPRESSION 07/02/2007  . GASTROENTERITIS, VIRAL 11/29/2010  . GERD 01/21/2011  . Headache(784.0) 07/02/2007  . HYPOTHYROIDISM 07/02/2007  . OSTEOARTHRITIS 07/02/2007  . PAIN, CHRONIC, DUE TO TRAUMA 07/02/2007  . PELVIC  PAIN 03/29/2010  . Other and unspecified ovarian cyst 05/19/2009  . UTI 05/19/2009  . Vitamin D insufficiency   . Lumbar disc disease   . Lumbar pain with radiation down right leg 06/01/2011   Past Surgical History  Procedure Laterality Date  . Abdominal hysterectomy    . Cholecystectomy     Family History  Problem Relation Age of Onset  . Alcohol abuse Other   . Anxiety disorder Other   . Coronary  artery disease Other   . Depression Other   . Stroke Other    History  Substance Use Topics  . Smoking status: Current Every Day Smoker  . Smokeless tobacco: Not on file  . Alcohol Use: No   OB History   Grav Para Term Preterm Abortions TAB SAB Ect Mult Living                 Review of Systems  Skin: Positive for wound (crown of head).  All other systems reviewed and are negative.   Allergies  Morphine  Home Medications   Prior to Admission medications   Medication Sig Start Date End Date Taking? Authorizing Provider  ALPRAZolam Prudy Feeler(XANAX) 0.5 MG tablet Take 0.5 mg by mouth at bedtime as needed for anxiety. 02/19/14   Corwin LevinsJames W John, MD  amoxicillin-clavulanate (AUGMENTIN) 875-125 MG per tablet Take 1 tablet by mouth 2 (two) times daily. 03/04/14   Rolland PorterMark James, MD  amphetamine-dextroamphetamine (ADDERALL) 30 MG tablet Take 15-45 mg by mouth 2 (two) times daily. 1.5 tab by mouth in the AM, and 1/2 tab in the PM - to fill Apr 20, 2014 02/19/14   Corwin LevinsJames W John, MD  diphenhydrAMINE (BENADRYL) 25 mg capsule Take 25 mg by mouth every 6 (six) hours as needed for allergies.    Historical Provider, MD  estradiol (ESTRACE) 1 MG tablet Take 1 tablet (1 mg total) by mouth daily. 10/11/12  10/11/13  Corwin LevinsJames W John, MD  HYDROcodone-acetaminophen (NORCO) 10-325 MG per tablet Take 1 tablet by mouth every 6 (six) hours as needed for moderate pain. To fill Apr 20, 2014 02/19/14   Corwin LevinsJames W John, MD  HYDROcodone-acetaminophen (NORCO/VICODIN) 5-325 MG per tablet Take 2 tablets by mouth every 4 (four) hours as needed. 03/04/14   Rolland PorterMark James, MD  ibuprofen (ADVIL,MOTRIN) 600 MG tablet Take 600 mg by mouth every 8 (eight) hours as needed for moderate pain. 02/15/14   Lelon PerlaYvonne R Lowne, DO  predniSONE (DELTASONE) 10 MG tablet Take 2 tablets (20 mg total) by mouth daily. 03/04/14   Rolland PorterMark James, MD  sulfamethoxazole-trimethoprim (SEPTRA DS) 800-160 MG per tablet Take 2 tablets by mouth 2 (two) times daily. 03/04/14   Rolland PorterMark James, MD    Triage Vitals: BP 122/86  Pulse 83  Temp(Src) 98.2 F (36.8 C) (Oral)  Resp 16  SpO2 100%  Physical Exam  Nursing note and vitals reviewed. Constitutional: She is oriented to person, place, and time. She appears well-developed and well-nourished.  HENT:  Head: Normocephalic and atraumatic.  Eyes: EOM are normal.  Neck: Normal range of motion.  Cardiovascular: Normal rate.   Pulmonary/Chest: Effort normal.  Musculoskeletal: Normal range of motion.  Neurological: She is alert and oriented to person, place, and time.  Skin: Skin is warm and dry.  Psychiatric: She has a normal mood and affect. Her behavior is normal.    ED Course  Procedures (including critical care time)  DIAGNOSTIC STUDIES: Oxygen Saturation is 100% on room air, normal by my interpretation.    COORDINATION OF CARE: 6:08 PM- Discussed applying antibiotic ointment on the wound and dressing it.  Advised the patient to continue taking her antibiotics.  The patient agreed to the treatment plan.   Labs Review Labs Reviewed - No data to display  Imaging Review No results found.   EKG Interpretation None      MDM   Final diagnoses:  Wound check, abscess   6:15 PM Patient's wound appears to be healing. I removed the packing and was able to express more pus from the wound. Patient will have bacitracin ointment applied and gauze on top. Patient advised to continue antibiotic course. Vitals stable and patient afebrile.   I personally performed the services described in this documentation, which was scribed in my presence. The recorded information has been reviewed and is accurate.   Emilia BeckKaitlyn Payam Gribble, PA-C 03/07/14 1816

## 2014-03-07 NOTE — Discharge Instructions (Signed)
Continue to keep wound clean and finish your antibiotic course. Follow up with your doctor as needed.

## 2014-05-01 ENCOUNTER — Other Ambulatory Visit: Payer: Self-pay | Admitting: Internal Medicine

## 2014-06-25 ENCOUNTER — Other Ambulatory Visit: Payer: Self-pay | Admitting: Pain Medicine

## 2014-06-25 DIAGNOSIS — M47817 Spondylosis without myelopathy or radiculopathy, lumbosacral region: Secondary | ICD-10-CM

## 2014-06-25 DIAGNOSIS — M5137 Other intervertebral disc degeneration, lumbosacral region: Secondary | ICD-10-CM

## 2014-06-30 ENCOUNTER — Other Ambulatory Visit: Payer: 59

## 2014-07-08 ENCOUNTER — Other Ambulatory Visit: Payer: 59

## 2014-07-18 ENCOUNTER — Telehealth: Payer: Self-pay

## 2014-07-18 MED ORDER — AMPHETAMINE-DEXTROAMPHETAMINE 30 MG PO TABS: 30.0000 mg | ORAL_TABLET | Freq: Two times a day (BID) | ORAL | Status: DC

## 2014-07-18 NOTE — Telephone Encounter (Signed)
3mo - Done hardcopy to robin  

## 2014-07-18 NOTE — Telephone Encounter (Signed)
The patient is requesting a refill on her Adderall.  She would like a 3 month supply.

## 2014-07-18 NOTE — Telephone Encounter (Signed)
Called the patient informed to pickup hardcopy's at the front desk. 

## 2014-07-21 ENCOUNTER — Telehealth: Payer: Self-pay | Admitting: Internal Medicine

## 2014-07-21 DIAGNOSIS — Z0000 Encounter for general adult medical examination without abnormal findings: Secondary | ICD-10-CM

## 2014-07-21 NOTE — Telephone Encounter (Signed)
Patient has scheduled an appt for the end of September for a med fu.  She also is requesting Dr. Jonny Ruiz to order all lab work that needs to be ordered plus a thyroid text b/c she states she has a knot that has popped up on right lower abdominal area.   Patient states she can not come in any earlier b.c she has other doctor appointments she has to tend to in regards to pain.  Please advise.

## 2014-07-22 NOTE — Telephone Encounter (Signed)
Called left a detailed message of labs entered as requested.

## 2014-07-22 NOTE — Telephone Encounter (Signed)
Labs ordered.

## 2014-08-01 ENCOUNTER — Other Ambulatory Visit: Payer: 59

## 2014-08-01 ENCOUNTER — Inpatient Hospital Stay: Admission: RE | Admit: 2014-08-01 | Payer: 59 | Source: Ambulatory Visit

## 2014-08-14 ENCOUNTER — Ambulatory Visit: Payer: 59 | Admitting: Internal Medicine

## 2014-08-21 ENCOUNTER — Other Ambulatory Visit: Payer: Self-pay | Admitting: Internal Medicine

## 2014-08-21 NOTE — Telephone Encounter (Signed)
Done hardcopy to robin  

## 2014-08-22 NOTE — Telephone Encounter (Signed)
Faxed hardcopy for Alprazolam 0.5 mg to [facility] [place]

## 2014-10-06 ENCOUNTER — Telehealth: Payer: Self-pay | Admitting: Internal Medicine

## 2014-10-06 ENCOUNTER — Other Ambulatory Visit: Payer: 59

## 2014-10-06 NOTE — Telephone Encounter (Signed)
Pt came by to get labs done and they have been expired. Pt would like these resubmitted and wants to add another lab for liver or jaundice? Pt wants to know if the extra lab will be expensive and wants to be contacted to discuss a specific need.

## 2014-10-07 NOTE — Telephone Encounter (Signed)
Patient informed of MD instructions. 

## 2014-10-07 NOTE — Telephone Encounter (Signed)
Please consider OV as jaundice would be a new problem for her I think, and might represent worsening liver dz

## 2014-10-15 ENCOUNTER — Ambulatory Visit (INDEPENDENT_AMBULATORY_CARE_PROVIDER_SITE_OTHER): Payer: 59 | Admitting: Internal Medicine

## 2014-10-15 ENCOUNTER — Encounter: Payer: Self-pay | Admitting: Internal Medicine

## 2014-10-15 VITALS — BP 124/80 | HR 69 | Temp 98.3°F | Ht 64.5 in | Wt 122.4 lb

## 2014-10-15 DIAGNOSIS — Z8619 Personal history of other infectious and parasitic diseases: Secondary | ICD-10-CM

## 2014-10-15 DIAGNOSIS — F988 Other specified behavioral and emotional disorders with onset usually occurring in childhood and adolescence: Secondary | ICD-10-CM

## 2014-10-15 DIAGNOSIS — F17200 Nicotine dependence, unspecified, uncomplicated: Secondary | ICD-10-CM

## 2014-10-15 DIAGNOSIS — Z720 Tobacco use: Secondary | ICD-10-CM

## 2014-10-15 DIAGNOSIS — E890 Postprocedural hypothyroidism: Secondary | ICD-10-CM

## 2014-10-15 DIAGNOSIS — R5383 Other fatigue: Secondary | ICD-10-CM

## 2014-10-15 DIAGNOSIS — J438 Other emphysema: Secondary | ICD-10-CM

## 2014-10-15 DIAGNOSIS — Z23 Encounter for immunization: Secondary | ICD-10-CM

## 2014-10-15 DIAGNOSIS — F909 Attention-deficit hyperactivity disorder, unspecified type: Secondary | ICD-10-CM

## 2014-10-15 DIAGNOSIS — F411 Generalized anxiety disorder: Secondary | ICD-10-CM

## 2014-10-15 MED ORDER — AMPHETAMINE-DEXTROAMPHETAMINE 30 MG PO TABS: 30.0000 mg | ORAL_TABLET | Freq: Two times a day (BID) | ORAL | Status: DC

## 2014-10-15 MED ORDER — ALPRAZOLAM 0.5 MG PO TABS
0.5000 mg | ORAL_TABLET | Freq: Every evening | ORAL | Status: DC | PRN
Start: 2014-10-15 — End: 2015-04-15

## 2014-10-15 NOTE — Patient Instructions (Signed)
You had the flu shot, and the Shingles shot today  Please continue all other medications as before, and refills have been done if requested - the xanax, and adderall  Please have the pharmacy call with any other refills you may need.  Please keep your appointments with your specialists as you may have planned  Please go to the LAB in the Basement (turn left off the elevator) for the tests to be done at your convenience (such as Friday Nov 27)  You will be contacted regarding the referral for: CT scan (low dose) for lung cancer screening  You will be contacted by phone if any changes need to be made immediately.  Otherwise, you will receive a letter about your results with an explanation, but please check with MyChart first.  Please remember to sign up for MyChart if you have not done so, as this will be important to you in the future with finding out test results, communicating by private email, and scheduling acute appointments online when needed.  Please return in 6 months, or sooner if needed

## 2014-10-15 NOTE — Assessment & Plan Note (Signed)
stable overall by history and exam,  and pt to continue medical treatment as before,  to f/u any worsening symptoms or concerns, for med refill 

## 2014-10-15 NOTE — Assessment & Plan Note (Signed)
Etiology unclear, Exam otherwise benign, to check labs as documented, follow with expectant management  

## 2014-10-15 NOTE — Assessment & Plan Note (Addendum)
stable overall by history and exam, recent data reviewed with pt, and pt to continue medical treatment as before,  to f/u any worsening symptoms or concerns SpO2 Readings from Last 3 Encounters:  10/15/14 98%  03/07/14 100%  03/04/14 100%   Note:  Total time for pt hx, exam, review of record with pt in the room, determination of diagnoses and plan for further eval and tx is > 40 min, with over 50% spent in coordination and counseling of patient

## 2014-10-15 NOTE — Progress Notes (Signed)
Subjective:    Patient ID: Kathy HaberChristine Blaney, female    DOB: 1951-11-07, 63 y.o.   MRN: [id number]  HPI  Here to f/u, Does c/o ongoing fatigue, but denies signficant daytime hypersomnolence.  Denies hyper or hypo thyroid symptoms such as voice, skin or hair change.  Asks for hep C check with hx of hep B.  Has intermittent right ear popping recently without pain, decreased hearing, vertigo or fever.  Denies worsening depressive symptoms, suicidal ideation, or panic; has ongoing anxiety, not increased recently.   Pt denies chest pain, increased sob or doe, wheezing, orthopnea, PND, increased LE swelling, palpitations, dizziness or syncope.  Pt denies new neurological symptoms such as new headache, or facial or extremity weakness or numbness  Meds for ADD working well, needs refill, able to function socailly and work with good task completion.  Still smoking 1 ppd since 63 yo (now 2763)- total 49 yrs. For start low dose CT lung Ca screening;  Per CMS guidelines, I have determined the eligibility including patient age 31(55-80), and absence of any signs of lung cancer.  Specific calculation of number of pack years is documented.  Quit smoking years is documented.  Shared decision making engaged today, including a discussion of the benefits and harms of screening, discussion of need for followup with additional testing, risks of over-diagnosis, risk of false-positive screening examinations, and risk of radiation exposure.  Counseling done today on the importance of adherence to annual lunge cancer LDCT screening, importance of quit smoking and remaining quit, and tobacco cessation instructions given.  There is also discussion with patient regarding the impact of comorbidities, and patient states is able and willing to undergo diagnosis and treatment. Past Medical History  Diagnosis Date  . ABDOMINAL TENDERNESS 01/21/2011  . ACUTE GINGIVITIS NONPLAQUE INDUCED 06/11/2010  . ADD 09/05/2008  . Alcohol abuse, in  remission 09/27/2007  . ALLERGIC RHINITIS 03/02/2009  . ANXIETY 07/02/2007  . BACK PAIN 05/19/2009  . CHRONIC OBSTRUCTIVE PULMONARY DISEASE, ACUTE EXACERBATION 08/06/2009  . COPD 07/02/2007  . DEGENERATIVE JOINT DISEASE, CERVICAL SPINE 07/02/2007  . DEPRESSION 07/02/2007  . GASTROENTERITIS, VIRAL 11/29/2010  . GERD 01/21/2011  . Headache(784.0) 07/02/2007  . HYPOTHYROIDISM 07/02/2007  . OSTEOARTHRITIS 07/02/2007  . PAIN, CHRONIC, DUE TO TRAUMA 07/02/2007  . PELVIC  PAIN 03/29/2010  . Other and unspecified ovarian cyst 05/19/2009  . UTI 05/19/2009  . Vitamin D insufficiency   . Lumbar disc disease   . Lumbar pain with radiation down right leg 06/01/2011   Past Surgical History  Procedure Laterality Date  . Abdominal hysterectomy    . Cholecystectomy      reports that she has been smoking.  She does not have any smokeless tobacco history on file. She reports that she does not drink alcohol or use illicit drugs. family history includes Alcohol abuse in her other; Anxiety disorder in her other; Coronary artery disease in her other; Depression in her other; Stroke in her other. Allergies  Allergen Reactions  . Morphine Nausea And Vomiting   Current Outpatient Prescriptions on File Prior to Visit  Medication Sig Dispense Refill  . ALPRAZolam (XANAX) 0.5 MG tablet TAKE ONE TABLET BY MOUTH EVERY NIGHT AT BEDTIME AS NEEDED 30 tablet 5  . amphetamine-dextroamphetamine (ADDERALL) 30 MG tablet Take 1 tablet by mouth 2 (two) times daily. To fill Sep 16, 2014 60 tablet 0  . estradiol (ESTRACE) 1 MG tablet TAKE ONE (1) TABLET BY MOUTH EVERY DAY 90 tablet 3  . HYDROcodone-acetaminophen (NORCO)  10-325 MG per tablet Take 1 tablet by mouth every 6 (six) hours as needed for moderate pain. To fill Apr 20, 2014    . HYDROcodone-acetaminophen (NORCO/VICODIN) 5-325 MG per tablet Take 2 tablets by mouth every 4 (four) hours as needed. 10 tablet 0  . ibuprofen (ADVIL,MOTRIN) 600 MG tablet Take 600 mg by mouth every 8  (eight) hours as needed for moderate pain.    Marland Kitchen. estradiol (ESTRACE) 1 MG tablet Take 1 tablet (1 mg total) by mouth daily. 90 tablet 3  . [DISCONTINUED] FLUoxetine (PROZAC) 20 MG capsule Take 1 capsule (20 mg total) by mouth daily. 30 capsule 11   No current facility-administered medications on file prior to visit.   Review of Systems  Constitutional: Negative for unusual diaphoresis or other sweats  HENT: Negative for ringing in ear Eyes: Negative for double vision or worsening visual disturbance.  Respiratory: Negative for choking and stridor.   Gastrointestinal: Negative for vomiting or other signifcant bowel change Genitourinary: Negative for hematuria or decreased urine volume.  Musculoskeletal: Negative for other MSK pain or swelling Skin: Negative for color change and worsening wound.  Neurological: Negative for tremors and numbness other than noted  Psychiatric/Behavioral: Negative for decreased concentration or agitation other than above       Objective:   Physical Exam BP 124/80 mmHg  Pulse 69  Temp(Src) 98.3 F (36.8 C) (Oral)  Ht 5' 4.5" (1.638 m)  Wt 122 lb 6 oz (55.509 kg)  BMI 20.69 kg/m2  SpO2 98% VS noted,  Constitutional: Pt appears well-developed, well-nourished.  HENT: Head: NCAT.  Right Ear: External ear normal.  Left Ear: External ear normal.  Eyes: . Pupils are equal, round, and reactive to light. Conjunctivae and EOM are normal Neck: Normal range of motion. Neck supple.  Cardiovascular: Normal rate and regular rhythm.   Pulmonary/Chest: Effort normal and breath sounds without rales or wheezing.  Abd:  Soft, NT, ND, + BS Neurological: Pt is alert. Not confused , motor grossly intact Skin: Skin is warm. No rash Psychiatric: Pt behavior is normal. No agitation.     Assessment & Plan:

## 2014-10-15 NOTE — Assessment & Plan Note (Signed)
stable overall by history and exam, recent data reviewed with pt, and pt to continue medical treatment as before,  to f/u any worsening symptoms or concerns Lab Results  Component Value Date   TSH 1.23 02/18/2013   For f/u labs

## 2014-10-15 NOTE — Assessment & Plan Note (Signed)
stable overall by history and exam, recent data reviewed with pt, and pt to continue medical treatment as before,  to f/u any worsening symptoms or concerns Lab Results  Component Value Date   WBC 7.1 02/18/2013   HGB 12.1 02/18/2013   HCT 36.1 02/18/2013   PLT 295.0 02/18/2013   GLUCOSE 94 02/18/2013   CHOL 191 02/18/2013   TRIG 134.0 02/18/2013   HDL 67.00 02/18/2013   LDLDIRECT 121.4 12/02/2010   LDLCALC 97 02/18/2013   ALT 15 02/18/2013   AST 20 02/18/2013   NA 136 02/18/2013   K 3.9 02/18/2013   CL 102 02/18/2013   CREATININE 0.7 02/18/2013   BUN 14 02/18/2013   CO2 27 02/18/2013   TSH 1.23 02/18/2013

## 2014-10-15 NOTE — Progress Notes (Signed)
Pre visit review using our clinic review tool, if applicable. No additional management support is needed unless otherwise documented below in the visit note. 

## 2014-10-15 NOTE — Assessment & Plan Note (Signed)
Also for Low Dose CT scan lung cancer screening

## 2014-10-20 ENCOUNTER — Telehealth: Payer: Self-pay | Admitting: Internal Medicine

## 2014-10-20 NOTE — Telephone Encounter (Signed)
Pt came by office today and wanted to know if Dr. Jonny RuizJohn would write her Adderall script for a total of three months instead of just the one month he did last week for her.  Also, pt stated she told Dr. Jonny RuizJohn she had a chest cold and he listened to her lungs and said they didn't sound good but did not recommend an antibiotic then.  Since seeing Dr. Jonny RuizJohn, pt states her R neck glad is painful and is running a slight fever.  Pt wants to know if Dr. Jonny RuizJohn would be willing to call in an antibiotic since she was just in the office?  Pt is afraid of going into Vertigo.

## 2014-10-21 MED ORDER — AMPHETAMINE-DEXTROAMPHETAMINE 30 MG PO TABS: 30.0000 mg | ORAL_TABLET | Freq: Two times a day (BID) | ORAL | Status: DC

## 2014-10-21 MED ORDER — AZITHROMYCIN 250 MG PO TABS
ORAL_TABLET | ORAL | Status: DC
Start: 2014-10-21 — End: 2015-04-15

## 2014-10-21 NOTE — Telephone Encounter (Signed)
Ok or zpack - done erx  [facility]for total 3[facility] - /[facility]

## 2014-10-21 NOTE — Telephone Encounter (Signed)
Patient informed xpack sent in and hardcopy's are ready for pickup at the front desk. (Left detailed message)

## 2014-10-22 ENCOUNTER — Other Ambulatory Visit (INDEPENDENT_AMBULATORY_CARE_PROVIDER_SITE_OTHER): Payer: 59

## 2014-10-22 ENCOUNTER — Telehealth: Payer: Self-pay | Admitting: Internal Medicine

## 2014-10-22 DIAGNOSIS — E890 Postprocedural hypothyroidism: Secondary | ICD-10-CM

## 2014-10-22 DIAGNOSIS — Z8619 Personal history of other infectious and parasitic diseases: Secondary | ICD-10-CM

## 2014-10-22 DIAGNOSIS — R5383 Other fatigue: Secondary | ICD-10-CM

## 2014-10-22 LAB — CBC WITH DIFFERENTIAL/PLATELET
BASOS PCT: 0.4 % (ref 0.0–3.0)
Basophils Absolute: 0 10*3/uL (ref 0.0–0.1)
EOS PCT: 2.2 % (ref 0.0–5.0)
Eosinophils Absolute: 0.2 10*3/uL (ref 0.0–0.7)
HEMATOCRIT: 38.1 % (ref 36.0–46.0)
Hemoglobin: 12.6 g/dL (ref 12.0–15.0)
LYMPHS ABS: 2.6 10*3/uL (ref 0.7–4.0)
Lymphocytes Relative: 35.1 % (ref 12.0–46.0)
MCHC: 33.1 g/dL (ref 30.0–36.0)
MCV: 89.2 fl (ref 78.0–100.0)
MONO ABS: 0.7 10*3/uL (ref 0.1–1.0)
Monocytes Relative: 9.6 % (ref 3.0–12.0)
Neutro Abs: 3.9 10*3/uL (ref 1.4–7.7)
Neutrophils Relative %: 52.7 % (ref 43.0–77.0)
PLATELETS: 323 10*3/uL (ref 150.0–400.0)
RBC: 4.27 Mil/uL (ref 3.87–5.11)
RDW: 12.8 % (ref 11.5–15.5)
WBC: 7.3 10*3/uL (ref 4.0–10.5)

## 2014-10-22 LAB — URINALYSIS, ROUTINE W REFLEX MICROSCOPIC
Bilirubin Urine: NEGATIVE
Hgb urine dipstick: NEGATIVE
KETONES UR: NEGATIVE
Leukocytes, UA: NEGATIVE
Nitrite: POSITIVE — AB
PH: 6 (ref 5.0–8.0)
Specific Gravity, Urine: 1.025 (ref 1.000–1.030)
TOTAL PROTEIN, URINE-UPE24: NEGATIVE
Urine Glucose: NEGATIVE
Urobilinogen, UA: 0.2 (ref 0.0–1.0)

## 2014-10-22 NOTE — Telephone Encounter (Signed)
emmi mailed  °

## 2014-10-23 ENCOUNTER — Encounter: Payer: Self-pay | Admitting: Internal Medicine

## 2014-10-23 LAB — BASIC METABOLIC PANEL
BUN: 24 mg/dL — AB (ref 6–23)
CO2: 25 mEq/L (ref 19–32)
Calcium: 8.8 mg/dL (ref 8.4–10.5)
Chloride: 104 mEq/L (ref 96–112)
Creatinine, Ser: 0.7 mg/dL (ref 0.4–1.2)
GFR: 84.15 mL/min (ref >60.00)
Glucose, Bld: 78 mg/dL (ref 70–99)
Potassium: 3.7 mEq/L (ref 3.5–5.1)
SODIUM: 132 meq/L — AB (ref 135–145)

## 2014-10-23 LAB — LIPID PANEL
CHOL/HDL RATIO: 3
Cholesterol: 217 mg/dL — ABNORMAL HIGH (ref 0–200)
HDL: 74.1 mg/dL (ref >39.00)
LDL Cholesterol: 120 mg/dL — ABNORMAL HIGH (ref 0–99)
NonHDL: 142.9
TRIGLYCERIDES: 117 mg/dL (ref 0.0–149.0)
VLDL: 23.4 mg/dL (ref 0.0–40.0)

## 2014-10-23 LAB — HEPATIC FUNCTION PANEL
ALT: 19 U/L (ref 0–35)
AST: 20 U/L (ref 0–37)
Albumin: 3.9 g/dL (ref 3.5–5.2)
Alkaline Phosphatase: 71 U/L (ref 39–117)
Bilirubin, Direct: 0 mg/dL (ref 0.0–0.3)
Total Bilirubin: 0.3 mg/dL (ref 0.2–1.2)
Total Protein: 6.5 g/dL (ref 6.0–8.3)

## 2014-10-23 LAB — TSH: TSH: 1.93 u[IU]/mL (ref 0.35–4.50)

## 2014-10-23 LAB — HEPATITIS PANEL, ACUTE
HCV AB: NEGATIVE
HEP B S AG: NEGATIVE
Hep A IgM: NONREACTIVE
Hep B C IgM: NONREACTIVE

## 2015-01-21 ENCOUNTER — Other Ambulatory Visit: Payer: Self-pay | Admitting: Internal Medicine

## 2015-01-21 MED ORDER — AMPHETAMINE-DEXTROAMPHETAMINE 30 MG PO TABS: ORAL_TABLET | ORAL | Status: DC

## 2015-01-21 NOTE — Telephone Encounter (Signed)
Pt. Is aware of Rx at the front desk.

## 2015-01-21 NOTE — Telephone Encounter (Signed)
Done hardcopy to cherina - total 3 months

## 2015-01-26 ENCOUNTER — Telehealth: Payer: Self-pay | Admitting: Internal Medicine

## 2015-01-26 NOTE — Telephone Encounter (Signed)
PA was submitted on Friday will call [facility] to check status...Raechel Chute/lmb

## 2015-01-26 NOTE — Telephone Encounter (Signed)
[facility]ed [facility] spoke with rep Terrill Mohralonia she stated med was approved starting today date til 01/23/16. Notified pharmacy spoke with molly gave approval status for Adderral. Notified pt adderral was approve should received today...Raechel Chute/lmb

## 2015-01-26 NOTE — Telephone Encounter (Signed)
Pt called in said that pharmacy faxed over a PA for her Adderall  Just wanted to know the status

## 2015-04-15 ENCOUNTER — Ambulatory Visit (INDEPENDENT_AMBULATORY_CARE_PROVIDER_SITE_OTHER): Payer: 59 | Admitting: Internal Medicine

## 2015-04-15 ENCOUNTER — Encounter: Payer: Self-pay | Admitting: Internal Medicine

## 2015-04-15 VITALS — BP 108/70 | HR 71 | Temp 98.4°F | Wt 131.1 lb

## 2015-04-15 DIAGNOSIS — F909 Attention-deficit hyperactivity disorder, unspecified type: Secondary | ICD-10-CM | POA: Diagnosis not present

## 2015-04-15 DIAGNOSIS — J309 Allergic rhinitis, unspecified: Secondary | ICD-10-CM

## 2015-04-15 DIAGNOSIS — J441 Chronic obstructive pulmonary disease with (acute) exacerbation: Secondary | ICD-10-CM

## 2015-04-15 DIAGNOSIS — Z0000 Encounter for general adult medical examination without abnormal findings: Secondary | ICD-10-CM

## 2015-04-15 DIAGNOSIS — G4700 Insomnia, unspecified: Secondary | ICD-10-CM | POA: Diagnosis not present

## 2015-04-15 DIAGNOSIS — F988 Other specified behavioral and emotional disorders with onset usually occurring in childhood and adolescence: Secondary | ICD-10-CM

## 2015-04-15 DIAGNOSIS — Z0189 Encounter for other specified special examinations: Secondary | ICD-10-CM

## 2015-04-15 MED ORDER — PREDNISONE 10 MG PO TABS: ORAL_TABLET | ORAL | Status: DC

## 2015-04-15 MED ORDER — METHYLPHENIDATE HCL ER (OSM) 54 MG PO TBCR: 54.0000 mg | EXTENDED_RELEASE_TABLET | ORAL | Status: DC

## 2015-04-15 MED ORDER — ALPRAZOLAM 0.5 MG PO TABS: 0.5000 mg | ORAL_TABLET | Freq: Every evening | ORAL | Status: DC | PRN

## 2015-04-15 NOTE — Progress Notes (Signed)
Subjective:    Patient ID: Kathy Vargas, female    DOB: Jan 01, 1951, 64 y.o.   MRN: [id number]  HPI  Here for f/u;  Overall doing ok;  Pt denies Chest pain, worsening SOB, DOE, wheezing, orthopnea, PND, worsening LE edema, palpitations, dizziness or syncope, except for 2 days onset mild wheeze and nonprod cough, no fever or sob..  Pt denies neurological change such as new headache, facial or extremity weakness.  Pt denies polydipsia, polyuria, or low sugar symptoms. Pt states overall good compliance with treatment and medications, good tolerability, and has been trying to follow appropriate diet.  Pt denies worsening depressive symptoms, suicidal ideation or panic. No fever, night sweats, wt loss, loss of appetite, or other constitutional symptoms. .   Getting repeat ESI per pain management, and Pt continues to have recurring LBP without change in severity, bowel or bladder change, fever, wt loss,  worsening LE pain/numbness/weakness, gait change or falls. Tends to be less active recently, gained 7 lbs.  Has incidnetal nosebleed today, minor, putting pressure on .Asks for adderall change sa does not seem to be working as well.    Still with signficant insomnia, requiring xanax qhs  Does have several wks ongoing nasal allergy symptoms with clearish congestion, itch and sneezing, without fever, pain, ST, cough, swelling or wheezing. Past Medical History  Diagnosis Date  . ABDOMINAL TENDERNESS 01/21/2011  . ACUTE GINGIVITIS NONPLAQUE INDUCED 06/11/2010  . ADD 09/05/2008  . Alcohol abuse, in remission 09/27/2007  . ALLERGIC RHINITIS 03/02/2009  . ANXIETY 07/02/2007  . BACK PAIN 05/19/2009  . CHRONIC OBSTRUCTIVE PULMONARY DISEASE, ACUTE EXACERBATION 08/06/2009  . COPD 07/02/2007  . DEGENERATIVE JOINT DISEASE, CERVICAL SPINE 07/02/2007  . DEPRESSION 07/02/2007  . GASTROENTERITIS, VIRAL 11/29/2010  . GERD 01/21/2011  . Headache(784.0) 07/02/2007  . HYPOTHYROIDISM 07/02/2007  . OSTEOARTHRITIS 07/02/2007  . PAIN,  CHRONIC, DUE TO TRAUMA 07/02/2007  . PELVIC  PAIN 03/29/2010  . Other and unspecified ovarian cyst 05/19/2009  . UTI 05/19/2009  . Vitamin D insufficiency   . Lumbar disc disease   . Lumbar pain with radiation down right leg 06/01/2011   Past Surgical History  Procedure Laterality Date  . Abdominal hysterectomy    . Cholecystectomy      reports that she has been smoking.  She does not have any smokeless tobacco history on file. She reports that she does not drink alcohol or use illicit drugs. family history includes Alcohol abuse in her other; Anxiety disorder in her other; Coronary artery disease in her other; Depression in her other; Stroke in her other. Allergies  Allergen Reactions  . Morphine Nausea And Vomiting   Current Outpatient Prescriptions on File Prior to Visit  Medication Sig Dispense Refill  . ALPRAZolam (XANAX) 0.5 MG tablet Take 1 tablet (0.5 mg total) by mouth at bedtime as needed. 30 tablet 5  . amphetamine-dextroamphetamine (ADDERALL) 30 MG tablet TAKE ONE (1) TABLET BY MOUTH TWO (2) TIMES DAILY - to fill Mar 22, 2015 60 tablet 0  . estradiol (ESTRACE) 1 MG tablet TAKE ONE (1) TABLET BY MOUTH EVERY DAY 90 tablet 3  . HYDROcodone-acetaminophen (NORCO) 10-325 MG per tablet Take 1 tablet by mouth every 6 (six) hours as needed for moderate pain. To fill Apr 20, 2014    . azithromycin (ZITHROMAX Z-PAK) 250 MG tablet Use as directed (Patient not taking: Reported on 04/15/2015) 6 tablet 1  . HYDROcodone-acetaminophen (NORCO/VICODIN) 5-325 MG per tablet Take 2 tablets by mouth every 4 (four) hours  as needed. (Patient not taking: Reported on 04/15/2015) 10 tablet 0  . ibuprofen (ADVIL,MOTRIN) 600 MG tablet Take 600 mg by mouth every 8 (eight) hours as needed for moderate pain.    . [DISCONTINUED] FLUoxetine (PROZAC) 20 MG capsule Take 1 capsule (20 mg total) by mouth daily. 30 capsule 11   No current facility-administered medications on file prior to visit.     Review of Systems   Constitutional: Negative for unusual diaphoresis or night sweats HENT: Negative for ringing in ear or discharge Eyes: Negative for double vision or worsening visual disturbance.  Respiratory: Negative for choking and stridor.   Gastrointestinal: Negative for vomiting or other signifcant bowel change Genitourinary: Negative for hematuria or change in urine volume.  Musculoskeletal: Negative for other MSK pain or swelling Skin: Negative for color change and worsening wound.  Neurological: Negative for tremors and numbness other than noted  Psychiatric/Behavioral: Negative for decreased concentration or agitation other than above       Objective:   Physical Exam BP 108/70 mmHg  Pulse 71  Temp(Src) 98.4 F (36.9 C) (Oral)  Wt 131 lb 1.9 oz (59.476 kg)  SpO2 99% VS noted,  Constitutional: Pt appears in no significant distress HENT: Head: NCAT.  Right Ear: External ear normal.  Left Ear: External ear normal.  Eyes: . Pupils are equal, round, and reactive to light. Conjunctivae and EOM are normal Neck: Normal range of motion. Neck supple.  Cardiovascular: Normal rate and regular rhythm.   Pulmonary/Chest: Effort normal and breath sounds decreased without rales but mild  wheezing. bilat Neurological: Pt is alert. Not confused , motor grossly intact Skin: Skin is warm. No rash, no LE edema Psychiatric: Pt behavior is normal. No agitation.      Assessment & Plan:

## 2015-04-15 NOTE — Assessment & Plan Note (Signed)
Mild, for contd inhalers, also predpac asd, to f/u any worsening symptoms or concerns

## 2015-04-15 NOTE — Assessment & Plan Note (Signed)
Ok for xanax refill today,  to f/u any worsening symptoms or concerns

## 2015-04-15 NOTE — Assessment & Plan Note (Signed)
Also for predpac asd, . to f/u any worsening symptoms or concerns 

## 2015-04-15 NOTE — Patient Instructions (Addendum)
OK to stop the adderall  Please take all new medication as prescribed - the concerta   Please take all new medication as prescribed - the prednisone  Please continue all other medications as before, and refills have been done if requested.  Please have the pharmacy call with any other refills you may need.  Please continue your efforts at being more active, low cholesterol diet, and weight control.  Please keep your appointments with your specialists as you may have planned  Please remember to sign up for MyChart if you have not done so, as this will be important to you in the future with finding out test results, communicating by private email, and scheduling acute appointments online when needed.  Please return in 6 months, or sooner if needed, with Lab testing done 3-5 days before d

## 2015-04-15 NOTE — Progress Notes (Signed)
Pre visit review using our clinic review tool, if applicable. No additional management support is needed unless otherwise documented below in the visit note. 

## 2015-04-15 NOTE — Assessment & Plan Note (Signed)
Ok for trial change adderall to concerta 54 qd,  to f/u any worsening symptoms or concerns

## 2015-04-16 ENCOUNTER — Telehealth: Payer: Self-pay | Admitting: Internal Medicine

## 2015-04-16 NOTE — Telephone Encounter (Signed)
Pt states that she has enough Adderall to last for 2 weeks and she will continue on this medication until completed. Pt will call back at that time to request MD change her to generic Ritalin. Pt does not want PA completed: she says she does not "do well" on time release medications.

## 2015-04-16 NOTE — Telephone Encounter (Signed)
Patient states Dr. Jonny RuizJohn gave her concerta yesterday.  She does not want PA processed on this.  Patient states the pharmacy told her that this was time released.  Patient states that time release does not work for her.  She is requesting the highest dosage of ritalin to be prescribed and [facility].  Patient states if Dr. Jonny RuizJohn can not do this she would like to be put back on the aderrall.

## 2015-04-30 ENCOUNTER — Telehealth: Payer: Self-pay

## 2015-04-30 NOTE — Telephone Encounter (Signed)
Pt request 3 mth refills of Adderall. Pt decided not to continue with Concerat as it was not covered by her insurance (see previous note)

## 2015-05-01 MED ORDER — AMPHETAMINE-DEXTROAMPHET ER 30 MG PO CP24: 30.0000 mg | ORAL_CAPSULE | ORAL | Status: DC

## 2015-05-01 MED ORDER — AMPHETAMINE-DEXTROAMPHETAMINE 30 MG PO TABS: 30.0000 mg | ORAL_TABLET | Freq: Two times a day (BID) | ORAL | Status: DC

## 2015-05-01 MED ORDER — AMPHETAMINE-DEXTROAMPHET ER 30 MG PO CP24: ORAL_CAPSULE | ORAL | Status: DC

## 2015-05-01 NOTE — Telephone Encounter (Signed)
Done hardcopy to Dahlia  

## 2015-05-01 NOTE — Telephone Encounter (Signed)
Pt informed, Rx in cabinet for pt pick up  

## 2015-07-01 ENCOUNTER — Telehealth: Payer: Self-pay | Admitting: Geriatric Medicine

## 2015-07-01 NOTE — Telephone Encounter (Signed)
Left message asking patient to call us back to let us know if she has had a mammogram recently.  

## 2015-07-30 ENCOUNTER — Other Ambulatory Visit: Payer: Self-pay | Admitting: Internal Medicine

## 2015-07-30 MED ORDER — AMPHETAMINE-DEXTROAMPHETAMINE 30 MG PO TABS: ORAL_TABLET | ORAL | Status: DC

## 2015-07-30 NOTE — Telephone Encounter (Signed)
Done hardcopy to dahlia/LIM B - x 3 rx

## 2015-07-31 NOTE — Telephone Encounter (Signed)
Pt informed, Rx in cabinet for pt pick up  

## 2015-08-28 ENCOUNTER — Other Ambulatory Visit: Payer: Self-pay | Admitting: Pain Medicine

## 2015-08-28 DIAGNOSIS — G8929 Other chronic pain: Secondary | ICD-10-CM

## 2015-08-28 DIAGNOSIS — M545 Low back pain, unspecified: Secondary | ICD-10-CM

## 2015-09-10 ENCOUNTER — Ambulatory Visit
Admission: RE | Admit: 2015-09-10 | Discharge: 2015-09-10 | Disposition: A | Discharge status: Home / Self Care | Payer: 59 | Source: Ambulatory Visit | Attending: Pain Medicine | Admitting: Pain Medicine

## 2015-09-10 DIAGNOSIS — M545 Low back pain, unspecified: Secondary | ICD-10-CM

## 2015-09-10 DIAGNOSIS — G8929 Other chronic pain: Secondary | ICD-10-CM

## 2015-10-17 ENCOUNTER — Other Ambulatory Visit: Payer: Self-pay | Admitting: Internal Medicine

## 2015-10-19 NOTE — Telephone Encounter (Signed)
Ok to refill 

## 2015-10-20 NOTE — Telephone Encounter (Signed)
Done hardcopy to Dahlia  

## 2015-10-20 NOTE — Telephone Encounter (Signed)
Rx faxed to pharmacy  

## 2015-11-26 ENCOUNTER — Encounter: Payer: Self-pay | Admitting: Internal Medicine

## 2015-11-26 ENCOUNTER — Ambulatory Visit (INDEPENDENT_AMBULATORY_CARE_PROVIDER_SITE_OTHER): Payer: 59 | Admitting: Internal Medicine

## 2015-11-26 VITALS — BP 108/76 | HR 92 | Temp 98.0°F | Ht 65.0 in | Wt 130.0 lb

## 2015-11-26 DIAGNOSIS — Z0000 Encounter for general adult medical examination without abnormal findings: Secondary | ICD-10-CM | POA: Diagnosis not present

## 2015-11-26 DIAGNOSIS — F909 Attention-deficit hyperactivity disorder, unspecified type: Secondary | ICD-10-CM | POA: Diagnosis not present

## 2015-11-26 DIAGNOSIS — F988 Other specified behavioral and emotional disorders with onset usually occurring in childhood and adolescence: Secondary | ICD-10-CM

## 2015-11-26 DIAGNOSIS — Z23 Encounter for immunization: Secondary | ICD-10-CM

## 2015-11-26 DIAGNOSIS — F411 Generalized anxiety disorder: Secondary | ICD-10-CM | POA: Diagnosis not present

## 2015-11-26 MED ORDER — AMPHETAMINE-DEXTROAMPHETAMINE 30 MG PO TABS: ORAL_TABLET | ORAL | Status: DC

## 2015-11-26 NOTE — Progress Notes (Signed)
Subjective:    Patient ID: Kathy Vargas, female    DOB: Oct 02, 1951, 65 y.o.   MRN: [id number]  HPI    Here for wellness and f/u;  Overall doing ok;  Pt denies Chest pain, worsening SOB, DOE, wheezing, orthopnea, PND, worsening LE edema, palpitations, dizziness or syncope.  Pt denies neurological change such as new headache, facial or extremity weakness.  Pt denies polydipsia, polyuria, or low sugar symptoms. Pt states overall good compliance with treatment and medications, good tolerability, and has been trying to follow appropriate diet.  Pt denies worsening depressive symptoms, suicidal ideation or panic. No fever, night sweats, wt loss, loss of appetite, or other constitutional symptoms.  Pt states good ability with ADL's, has low fall risk, home safety reviewed and adequate, no other significant changes in hearing or vision, and only occasionally active with exercise. S/p lumbar disc surgury with Dr Dutch Quint in Nov 2016, still seeing pain clinic, back pain much improved.  Needs adderall refill, may need xanax soon. Denies worsening depressive symptoms, suicidal ideation, or panic; has ongoing anxiety.  Does not want to retire anytime soon, but job is fairly physical. Adderall working very well for concentration at work. Past Medical History  Diagnosis Date  . ABDOMINAL TENDERNESS 01/21/2011  . ACUTE GINGIVITIS NONPLAQUE INDUCED 06/11/2010  . ADD 09/05/2008  . Alcohol abuse, in remission 09/27/2007  . ALLERGIC RHINITIS 03/02/2009  . ANXIETY 07/02/2007  . BACK PAIN 05/19/2009  . CHRONIC OBSTRUCTIVE PULMONARY DISEASE, ACUTE EXACERBATION 08/06/2009  . COPD 07/02/2007  . DEGENERATIVE JOINT DISEASE, CERVICAL SPINE 07/02/2007  . DEPRESSION 07/02/2007  . GASTROENTERITIS, VIRAL 11/29/2010  . GERD 01/21/2011  . Headache(784.0) 07/02/2007  . HYPOTHYROIDISM 07/02/2007  . OSTEOARTHRITIS 07/02/2007  . PAIN, CHRONIC, DUE TO TRAUMA 07/02/2007  . PELVIC  PAIN 03/29/2010  . Other and unspecified ovarian cyst 05/19/2009  .  UTI 05/19/2009  . Vitamin D insufficiency   . Lumbar disc disease   . Lumbar pain with radiation down right leg 06/01/2011   Past Surgical History  Procedure Laterality Date  . Abdominal hysterectomy    . Cholecystectomy      reports that she has been smoking.  She does not have any smokeless tobacco history on file. She reports that she does not drink alcohol or use illicit drugs. family history includes Alcohol abuse in her other; Anxiety disorder in her other; Coronary artery disease in her other; Depression in her other; Stroke in her other. Allergies  Allergen Reactions  . Morphine Nausea And Vomiting   Current Outpatient Prescriptions on File Prior to Visit  Medication Sig Dispense Refill  . ALPRAZolam (XANAX) 0.5 MG tablet TAKE ONE TABLET BY MOUTH EVERY NIGHT AT BEDTIME AS NEEDED 30 tablet 5  . amphetamine-dextroamphetamine (ADDERALL) 30 MG tablet TAKE ONE (1) TABLET BY MOUTH TWO (2) TIMES DAILY 60 tablet 0  . estradiol (ESTRACE) 1 MG tablet TAKE ONE (1) TABLET BY MOUTH EVERY DAY 90 tablet 3  . HYDROcodone-acetaminophen (NORCO) 10-325 MG per tablet Take 1 tablet by mouth every 6 (six) hours as needed for moderate pain. To fill Apr 20, 2014    . HYDROcodone-acetaminophen (NORCO/VICODIN) 5-325 MG per tablet Take 2 tablets by mouth every 4 (four) hours as needed. (Patient not taking: Reported on 04/15/2015) 10 tablet 0  . ibuprofen (ADVIL,MOTRIN) 600 MG tablet Take 600 mg by mouth every 8 (eight) hours as needed for moderate pain. Reported on 11/26/2015    . predniSONE (DELTASONE) 10 MG tablet 3 tabs by mouth  per day for 3 days,2tabs per day for 3 days,1tab per day for 3 days (Patient not taking: Reported on 11/26/2015) 18 tablet 0  . [DISCONTINUED] FLUoxetine (PROZAC) 20 MG capsule Take 1 capsule (20 mg total) by mouth daily. 30 capsule 11   No current facility-administered medications on file prior to visit.    Review of Systems Constitutional: Negative for increased diaphoresis, other  activity, appetite or siginficant weight change other than noted HENT: Negative for worsening hearing loss, ear pain, facial swelling, mouth sores and neck stiffness.   Eyes: Negative for other worsening pain, redness or visual disturbance.  Respiratory: Negative for shortness of breath and wheezing  Cardiovascular: Negative for chest pain and palpitations.  Gastrointestinal: Negative for diarrhea, blood in stool, abdominal distention or other pain Genitourinary: Negative for hematuria, flank pain or change in urine volume.  Musculoskeletal: Negative for myalgias or other joint complaints.  Skin: Negative for color change and wound or drainage.  Neurological: Negative for syncope and numbness. other than noted Hematological: Negative for adenopathy. or other swelling Psychiatric/Behavioral: Negative for hallucinations, SI, self-injury, decreased concentration or other worsening agitation.      Objective:   Physical Exam BP 108/76 mmHg  Pulse 92  Temp(Src) 98 F (36.7 C) (Oral)  Ht 5\' 5"  (1.651 m)  Wt 130 lb (58.968 kg)  BMI 21.63 kg/m2  SpO2 97% VS noted,  Constitutional: Pt is oriented to person, place, and time. Appears well-developed and well-nourished, in no significant distress Head: Normocephalic and atraumatic.  Right Ear: External ear normal.  Left Ear: External ear normal.  Nose: Nose normal.  Mouth/Throat: Oropharynx is clear and moist.  Eyes: Conjunctivae and EOM are normal. Pupils are equal, round, and reactive to light.  Neck: Normal range of motion. Neck supple. No JVD present. No tracheal deviation present or significant neck LA or mass Cardiovascular: Normal rate, regular rhythm, normal heart sounds and intact distal pulses.   Pulmonary/Chest: Effort normal and breath sounds without rales or wheezing  Abdominal: Soft. Bowel sounds are normal. NT. No HSM  Musculoskeletal: Normal range of motion. Exhibits no edema.  Lymphadenopathy:  Has no cervical adenopathy.    Neurological: Pt is alert and oriented to person, place, and time. Pt has normal reflexes. No cranial nerve deficit. Motor grossly intact Skin: Skin is warm and dry. No rash noted.  Psychiatric:  Has nervous mood and affect. Behavior is normal.     Assessment & Plan:

## 2015-11-26 NOTE — Progress Notes (Signed)
Pre visit review using our clinic review tool, if applicable. No additional management support is needed unless otherwise documented below in the visit note. 

## 2015-11-26 NOTE — Assessment & Plan Note (Signed)

## 2015-11-26 NOTE — Assessment & Plan Note (Signed)
For adderall refills,  to f/u any worsening symptoms or concerns

## 2015-11-26 NOTE — Assessment & Plan Note (Signed)
stable overall by history and exam, recent data reviewed with pt, and pt to continue medical treatment as before,  to f/u any worsening symptoms or concerns Lab Results  Component Value Date   WBC 7.3 10/22/2014   HGB 12.6 10/22/2014   HCT 38.1 10/22/2014   PLT 323.0 10/22/2014   GLUCOSE 78 10/22/2014   CHOL 217* 10/22/2014   TRIG 117.0 10/22/2014   HDL 74.10 10/22/2014   LDLDIRECT 121.4 12/02/2010   LDLCALC 120* 10/22/2014   ALT 19 10/22/2014   AST 20 10/22/2014   NA 132* 10/22/2014   K 3.7 10/22/2014   CL 104 10/22/2014   CREATININE 0.7 10/22/2014   BUN 24* 10/22/2014   CO2 25 10/22/2014   TSH 1.93 10/22/2014

## 2015-11-26 NOTE — Patient Instructions (Addendum)
You had the new Prevnar pneumonia shot today  Please continue all other medications as before, and refills have been done if requested - the adderall x 3 mo  Please have the pharmacy call with any other refills you may need.  Please continue your efforts at being more active, low cholesterol diet, and weight control.  You are otherwise up to date with prevention measures today.  Please keep your appointments with your specialists as you may have planned  Please go to the LAB in the Basement (turn left off the elevator) for the tests to be done tomorrow  You will be contacted by phone if any changes need to be made immediately.  Otherwise, you will receive a letter about your results with an explanation, but please check with MyChart first.  Please remember to sign up for MyChart if you have not done so, as this will be important to you in the future with finding out test results, communicating by private email, and scheduling acute appointments online when needed.  Please return in 6 months, or sooner if needed

## 2015-11-26 NOTE — Addendum Note (Signed)
Addended by: Anselm JunglingBONYUN-DEBOURGH, Kaena Santori M on: 11/26/2015 02:10 PM   Modules accepted: Orders

## 2015-12-17 ENCOUNTER — Other Ambulatory Visit (INDEPENDENT_AMBULATORY_CARE_PROVIDER_SITE_OTHER): Payer: 59

## 2015-12-17 DIAGNOSIS — Z0000 Encounter for general adult medical examination without abnormal findings: Secondary | ICD-10-CM

## 2015-12-17 LAB — CBC WITH DIFFERENTIAL/PLATELET
BASOS PCT: 0.6 % (ref 0.0–3.0)
Basophils Absolute: 0.1 10*3/uL (ref 0.0–0.1)
EOS PCT: 3 % (ref 0.0–5.0)
Eosinophils Absolute: 0.3 10*3/uL (ref 0.0–0.7)
HCT: 41.5 % (ref 36.0–46.0)
HEMOGLOBIN: 13.6 g/dL (ref 12.0–15.0)
Lymphocytes Relative: 35.9 % (ref 12.0–46.0)
Lymphs Abs: 3.2 10*3/uL (ref 0.7–4.0)
MCHC: 32.9 g/dL (ref 30.0–36.0)
MCV: 89 fl (ref 78.0–100.0)
MONOS PCT: 6.7 % (ref 3.0–12.0)
Monocytes Absolute: 0.6 10*3/uL (ref 0.1–1.0)
Neutro Abs: 4.8 10*3/uL (ref 1.4–7.7)
Neutrophils Relative %: 53.8 % (ref 43.0–77.0)
Platelets: 401 10*3/uL — ABNORMAL HIGH (ref 150.0–400.0)
RBC: 4.66 Mil/uL (ref 3.87–5.11)
RDW: 13 % (ref 11.5–15.5)
WBC: 8.9 10*3/uL (ref 4.0–10.5)

## 2015-12-17 LAB — BASIC METABOLIC PANEL
BUN: 16 mg/dL (ref 6–23)
CALCIUM: 9.2 mg/dL (ref 8.4–10.5)
CO2: 25 mEq/L (ref 19–32)
Chloride: 104 mEq/L (ref 96–112)
Creatinine, Ser: 0.84 mg/dL (ref 0.40–1.20)
GFR: 72.44 mL/min (ref >60.00)
GLUCOSE: 112 mg/dL — AB (ref 70–99)
POTASSIUM: 3.9 meq/L (ref 3.5–5.1)
SODIUM: 138 meq/L (ref 135–145)

## 2015-12-17 LAB — LIPID PANEL
CHOL/HDL RATIO: 3
CHOLESTEROL: 236 mg/dL — AB (ref 0–200)
HDL: 69.8 mg/dL (ref >39.00)
LDL CALC: 132 mg/dL — AB (ref 0–99)
NonHDL: 165.95
Triglycerides: 168 mg/dL — ABNORMAL HIGH (ref 0.0–149.0)
VLDL: 33.6 mg/dL (ref 0.0–40.0)

## 2015-12-17 LAB — URINALYSIS, ROUTINE W REFLEX MICROSCOPIC
Bilirubin Urine: NEGATIVE
Hgb urine dipstick: NEGATIVE
Ketones, ur: NEGATIVE
Nitrite: POSITIVE — AB
PH: 6 (ref 5.0–8.0)
SPECIFIC GRAVITY, URINE: 1.02 (ref 1.000–1.030)
Total Protein, Urine: NEGATIVE
URINE GLUCOSE: NEGATIVE
Urobilinogen, UA: 0.2 (ref 0.0–1.0)

## 2015-12-17 LAB — HEPATIC FUNCTION PANEL
ALBUMIN: 4.1 g/dL (ref 3.5–5.2)
ALK PHOS: 61 U/L (ref 39–117)
ALT: 13 U/L (ref 0–35)
AST: 11 U/L (ref 0–37)
BILIRUBIN TOTAL: 0.5 mg/dL (ref 0.2–1.2)
Bilirubin, Direct: 0.1 mg/dL (ref 0.0–0.3)
Total Protein: 6.7 g/dL (ref 6.0–8.3)

## 2015-12-17 LAB — TSH: TSH: 0.85 u[IU]/mL (ref 0.35–4.50)

## 2015-12-18 ENCOUNTER — Other Ambulatory Visit: Payer: Self-pay | Admitting: Internal Medicine

## 2015-12-18 ENCOUNTER — Encounter: Payer: Self-pay | Admitting: Internal Medicine

## 2015-12-18 MED ORDER — ROSUVASTATIN CALCIUM 10 MG PO TABS: 10.0000 mg | ORAL_TABLET | Freq: Every day | ORAL | Status: DC

## 2015-12-18 MED ORDER — CEPHALEXIN 500 MG PO CAPS: 500.0000 mg | ORAL_CAPSULE | Freq: Four times a day (QID) | ORAL | Status: DC

## 2016-02-22 ENCOUNTER — Telehealth: Payer: Self-pay

## 2016-02-22 NOTE — Telephone Encounter (Signed)
PA initiated and APPROVED via CoverMyMeds Key [id number], [id number]

## 2016-04-22 ENCOUNTER — Ambulatory Visit: Payer: 59 | Admitting: Internal Medicine

## 2016-05-06 ENCOUNTER — Encounter: Payer: Self-pay | Admitting: Internal Medicine

## 2016-05-06 ENCOUNTER — Ambulatory Visit (INDEPENDENT_AMBULATORY_CARE_PROVIDER_SITE_OTHER): Payer: 59 | Admitting: Internal Medicine

## 2016-05-06 ENCOUNTER — Telehealth: Payer: Self-pay | Admitting: Internal Medicine

## 2016-05-06 VITALS — BP 118/72 | HR 87 | Temp 98.2°F | Resp 20 | Wt 131.0 lb

## 2016-05-06 DIAGNOSIS — G894 Chronic pain syndrome: Secondary | ICD-10-CM | POA: Diagnosis not present

## 2016-05-06 DIAGNOSIS — Z0001 Encounter for general adult medical examination with abnormal findings: Secondary | ICD-10-CM

## 2016-05-06 DIAGNOSIS — F909 Attention-deficit hyperactivity disorder, unspecified type: Secondary | ICD-10-CM | POA: Diagnosis not present

## 2016-05-06 DIAGNOSIS — E785 Hyperlipidemia, unspecified: Secondary | ICD-10-CM | POA: Insufficient documentation

## 2016-05-06 DIAGNOSIS — R739 Hyperglycemia, unspecified: Secondary | ICD-10-CM | POA: Diagnosis not present

## 2016-05-06 DIAGNOSIS — F988 Other specified behavioral and emotional disorders with onset usually occurring in childhood and adolescence: Secondary | ICD-10-CM

## 2016-05-06 DIAGNOSIS — R6889 Other general symptoms and signs: Secondary | ICD-10-CM

## 2016-05-06 DIAGNOSIS — I872 Venous insufficiency (chronic) (peripheral): Secondary | ICD-10-CM | POA: Insufficient documentation

## 2016-05-06 MED ORDER — AMPHETAMINE-DEXTROAMPHETAMINE 30 MG PO TABS: ORAL_TABLET | ORAL | Status: DC

## 2016-05-06 MED ORDER — ALPRAZOLAM 0.5 MG PO TABS: 0.5000 mg | ORAL_TABLET | Freq: Every evening | ORAL | Status: DC | PRN

## 2016-05-06 MED ORDER — HYDROCHLOROTHIAZIDE 12.5 MG PO CAPS: 12.5000 mg | ORAL_CAPSULE | Freq: Every day | ORAL | Status: DC

## 2016-05-06 NOTE — Assessment & Plan Note (Signed)
Ok to d/c norco for now, pt declines referral to different pain clinic

## 2016-05-06 NOTE — Assessment & Plan Note (Signed)
Asympt, doubt new onset DM, declines f/u glc today Lab Results  Component Value Date   WBC 8.9 12/17/2015   HGB 13.6 12/17/2015   HCT 41.5 12/17/2015   PLT 401.0* 12/17/2015   GLUCOSE 112* 12/17/2015   CHOL 236* 12/17/2015   TRIG 168.0* 12/17/2015   HDL 69.80 12/17/2015   LDLDIRECT 121.4 12/02/2010   LDLCALC 132* 12/17/2015   ALT 13 12/17/2015   AST 11 12/17/2015   NA 138 12/17/2015   K 3.9 12/17/2015   CL 104 12/17/2015   CREATININE 0.84 12/17/2015   BUN 16 12/17/2015   CO2 25 12/17/2015   TSH 0.85 12/17/2015

## 2016-05-06 NOTE — Patient Instructions (Addendum)
Please take all new medication as prescribed - the fluid pill as needed only for swelling  OK to stop the norco  Please continue all other medications as before, and refills have been done if requested.  Please have the pharmacy call with any other refills you may need.  Please continue your efforts at being more active, low cholesterol diet, and weight control.  Please keep your appointments with your specialists as you may have planned  Please return in 6 months, or sooner if needed, with Lab testing done 3-5 days before

## 2016-05-06 NOTE — Assessment & Plan Note (Signed)
Ok to cont adderall at current dosing, declines referral to ADD clinic

## 2016-05-06 NOTE — Assessment & Plan Note (Signed)
Ok for hct 12.5 qd prn, . to f/u any worsening symptoms or concerns

## 2016-05-06 NOTE — Assessment & Plan Note (Addendum)
stable overall by history and exam, recent data reviewed with pt, and pt to continue medical treatment as before,  to f/u any worsening symptoms or concerns Lab Results  Component Value Date   LDLCALC 132* 12/17/2015   delcines f/u lipid/lft today  Note:  Total time for pt hx, exam, review of record with pt in the room, determination of diagnoses and plan for further eval and tx is > 40 min, with over 50% spent in coordination and counseling of patient

## 2016-05-06 NOTE — Telephone Encounter (Signed)
Patient's bp trends dont suggest needing hydrochlorithiazide----is she supposed to be on that or something else?-----please advise, i will call patient back, thanks

## 2016-05-06 NOTE — Telephone Encounter (Signed)
hct is used for both  Ok to cont current med list

## 2016-05-06 NOTE — Progress Notes (Signed)
Pre visit review using our clinic review tool, if applicable. No additional management support is needed unless otherwise documented below in the visit note. 

## 2016-05-06 NOTE — Telephone Encounter (Signed)
Pt request to speak to the assistant concern about hydrochlorothiazide (MICROZIDE) 12.5 MG capsule. Pt stated she want a water pill but the pharmacy told her this is for BP. Please call her back today.

## 2016-05-06 NOTE — Progress Notes (Signed)
Subjective:    Patient ID: Kathy Vargas, female    DOB: [birth year], 65 y.o.   MRN: [id number]  HPI  Here to f/u; overall doing ok,  Pt denies chest pain, increasing sob or doe, wheezing, orthopnea, PND, palpitations, dizziness or syncope, though does have new pedal edema daily now later in the day, denies polyphagia..  Pt denies new neurological symptoms such as new headache, or facial or extremity weakness or numbness.  Pt denies polydipsia, polyuria, or low sugar episode.   Pt denies new neurological symptoms such as new headache, or facial or extremity weakness or numbness.   Pt states overall good compliance with meds, mostly trying to follow appropriate diet, with wt overall stable,  but little exercise however. Does not want further labs today  Pt continues to have recurring LBP without change in severity, bowel or bladder change, fever, wt loss,  worsening LE pain/numbness/weakness, gait change or falls.  Pt was dismissed from pain management mar 2017, feels like "he picked a fight with me" b/c she did not want ESI to the lower back, especially due to the cost.  States felt others in the clinic felt that they were being manipulated toward the ESI's as well.  Feels she will just put up with the pain, does not want further norco., and I have explained to her I do not treat chronic pain with narcotics. No opiate w/d symtpoms such as n/v, sweats, or agitatoin  Also asks for tid adderal instead of BID, but when offered to be referred to ADD clinic she declines.    Denies worsening depressive symptoms, suicidal ideation, or panic; has ongoing anxiety, not increased recently.  Past Medical History  Diagnosis Date  . ABDOMINAL TENDERNESS 01/21/2011  . ACUTE GINGIVITIS NONPLAQUE INDUCED 06/11/2010  . ADD 09/05/2008  . Alcohol abuse, in remission 09/27/2007  . ALLERGIC RHINITIS 03/02/2009  . ANXIETY 07/02/2007  . BACK PAIN 05/19/2009  . CHRONIC OBSTRUCTIVE PULMONARY DISEASE, ACUTE EXACERBATION  08/06/2009  . COPD 07/02/2007  . DEGENERATIVE JOINT DISEASE, CERVICAL SPINE 07/02/2007  . DEPRESSION 07/02/2007  . GASTROENTERITIS, VIRAL 11/29/2010  . GERD 01/21/2011  . Headache(784.0) 07/02/2007  . HYPOTHYROIDISM 07/02/2007  . OSTEOARTHRITIS 07/02/2007  . PAIN, CHRONIC, DUE TO TRAUMA 07/02/2007  . PELVIC  PAIN 03/29/2010  . Other and unspecified ovarian cyst 05/19/2009  . UTI 05/19/2009  . Vitamin D insufficiency   . Lumbar disc disease   . Lumbar pain with radiation down right leg 06/01/2011   Past Surgical History  Procedure Laterality Date  . Abdominal hysterectomy    . Cholecystectomy      reports that she has been smoking.  She does not have any smokeless tobacco history on file. She reports that she does not drink alcohol or use illicit drugs. family history includes Alcohol abuse in her other; Anxiety disorder in her other; Coronary artery disease in her other; Depression in her other; Stroke in her other. Allergies  Allergen Reactions  . Morphine Nausea And Vomiting   Current Outpatient Prescriptions on File Prior to Visit  Medication Sig Dispense Refill  . ALPRAZolam (XANAX) 0.5 MG tablet TAKE ONE TABLET BY MOUTH EVERY NIGHT AT BEDTIME AS NEEDED 30 tablet 5  . amphetamine-dextroamphetamine (ADDERALL) 30 MG tablet TAKE ONE (1) TABLET BY MOUTH TWO (2) TIMES DAILY 60 tablet 0  . estradiol (ESTRACE) 1 MG tablet TAKE ONE (1) TABLET BY MOUTH EVERY DAY 90 tablet 3  . HYDROcodone-acetaminophen (NORCO) 10-325 MG per tablet Take 1 tablet by  mouth every 6 (six) hours as needed for moderate pain. To fill Apr 20, 2014    . ibuprofen (ADVIL,MOTRIN) 600 MG tablet Take 600 mg by mouth every 8 (eight) hours as needed for moderate pain. Reported on 11/26/2015    . rosuvastatin (CRESTOR) 10 MG tablet Take 1 tablet (10 mg total) by mouth daily. 90 tablet 3  . [DISCONTINUED] FLUoxetine (PROZAC) 20 MG capsule Take 1 capsule (20 mg total) by mouth daily. 30 capsule 11   No current facility-administered  medications on file prior to visit.   Review of Systems  Constitutional: Negative for unusual diaphoresis or night sweats HENT: Negative for ear swelling or discharge Eyes: Negative for worsening visual haziness  Respiratory: Negative for choking and stridor.   Gastrointestinal: Negative for distension or worsening eructation Genitourinary: Negative for retention or change in urine volume.  Musculoskeletal: Negative for other MSK pain or swelling Skin: Negative for color change and worsening wound Neurological: Negative for tremors and numbness other than noted  Psychiatric/Behavioral: Negative for decreased concentration or agitation other than above       Objective:   Physical Exam BP 118/72 mmHg  Pulse 87  Temp(Src) 98.2 F (36.8 C) (Oral)  Resp 20  Wt 131 lb (59.421 kg)  SpO2 97% VS noted,  Constitutional: Pt appears in no apparent distress HENT: Head: NCAT.  Right Ear: External ear normal.  Left Ear: External ear normal.  Eyes: . Pupils are equal, round, and reactive to light. Conjunctivae and EOM are normal Neck: Normal range of motion. Neck supple.  Cardiovascular: Normal rate and regular rhythm.   Pulmonary/Chest: Effort normal and breath sounds without rales or wheezing.  Abd:  Soft, NT, ND, + BS Neurological: Pt is alert. Not confused , motor grossly intact Skin: Skin is warm. No rash, tr pedal bilat LE edema, has bilat varicosities Psychiatric: Pt behavior is normal. No agitation. mild to mod nervous Spine; mild lower lumbar tender, no rash or swelling    Assessment & Plan:

## 2016-05-11 NOTE — Telephone Encounter (Signed)
Called patient 2nd attempt, unable to reach left message to give us a call back.

## 2016-05-11 NOTE — Telephone Encounter (Signed)
Left voicemail to give us a call back 

## 2016-05-11 NOTE — Telephone Encounter (Signed)
Patient aware of Dr. Johns advisement  

## 2016-05-26 ENCOUNTER — Ambulatory Visit: Payer: 59 | Admitting: Internal Medicine

## 2016-06-14 ENCOUNTER — Encounter: Payer: Self-pay | Admitting: Internal Medicine

## 2016-06-14 ENCOUNTER — Other Ambulatory Visit: Payer: 59

## 2016-06-14 ENCOUNTER — Telehealth: Payer: Self-pay | Admitting: Internal Medicine

## 2016-06-14 ENCOUNTER — Ambulatory Visit (INDEPENDENT_AMBULATORY_CARE_PROVIDER_SITE_OTHER): Payer: 59 | Admitting: Internal Medicine

## 2016-06-14 VITALS — BP 124/78 | HR 89 | Temp 97.9°F | Resp 20 | Wt 131.0 lb

## 2016-06-14 DIAGNOSIS — R300 Dysuria: Secondary | ICD-10-CM

## 2016-06-14 DIAGNOSIS — R739 Hyperglycemia, unspecified: Secondary | ICD-10-CM

## 2016-06-14 DIAGNOSIS — R310 Gross hematuria: Secondary | ICD-10-CM

## 2016-06-14 DIAGNOSIS — F329 Major depressive disorder, single episode, unspecified: Secondary | ICD-10-CM | POA: Diagnosis not present

## 2016-06-14 DIAGNOSIS — J438 Other emphysema: Secondary | ICD-10-CM

## 2016-06-14 DIAGNOSIS — F32A Depression, unspecified: Secondary | ICD-10-CM

## 2016-06-14 LAB — POCT URINALYSIS DIPSTICK
BILIRUBIN UA: NEGATIVE
Glucose, UA: NEGATIVE
KETONES UA: NEGATIVE
Nitrite, UA: NEGATIVE
PH UA: 6
SPEC GRAV UA: 1.025
Urobilinogen, UA: NEGATIVE

## 2016-06-14 MED ORDER — CEPHALEXIN 500 MG PO CAPS: 500.0000 mg | ORAL_CAPSULE | Freq: Four times a day (QID) | ORAL | 0 refills | Status: AC

## 2016-06-14 NOTE — Telephone Encounter (Signed)
Also see below

## 2016-06-14 NOTE — Telephone Encounter (Signed)
[facility] - Client TELEPHONE ADVICE RECORD [facility] Patient Name: Kathy Vargas DOB: [birth year] Initial Comment Caller says, she is urinating blood today, she wants an appt this afternoon Nurse Assessment Nurse: Lane Hacker, RN, Elvin So Date/Time (Eastern Time): 06/14/2016 11:02:50 AM Confirm and document reason for call. If symptomatic, describe symptoms. You must click the next button to save text entered. ---Caller states that she has been urinating blood since Sunday morning but light at the time. She has cont. to urinate blood off/ on. Clear urine last night. Dark blood this morning. Denies pain w/ urination. No fever. Has the patient traveled out of the country within the last 30 days? ---No Does the patient have any new or worsening symptoms? ---Yes Will a triage be completed? ---Yes Related visit to physician within the last 2 weeks? ---No Does the PT have any chronic conditions? (i.e. diabetes, asthma, etc.) ---Yes List chronic conditions. ---herniated disk on L5 - surgery in December 2016 Is this a behavioral health or substance abuse call? ---No Guidelines Guideline Title Affirmed Question Affirmed Notes Urine - Blood In Side (flank) or back pain present Final Disposition User See Physician within [address] Hours [place], [place], [place] Comments Appt made for today at 3:15 pm with Dr. Oliver Barre. Referrals REFERRED TO PCP OFFICE Disagree/Comply: Comply

## 2016-06-14 NOTE — Progress Notes (Signed)
Pre visit review using our clinic review tool, if applicable. No additional management support is needed unless otherwise documented below in the visit note. 

## 2016-06-14 NOTE — Telephone Encounter (Signed)
Just saw message, ok for appt  - I think we can see addon tonight at 6 pm)

## 2016-06-14 NOTE — Progress Notes (Signed)
Subjective:    Patient ID: Kathy Vargas, female    DOB: [birth year], 65 y.o.   MRN: [id number]  HPI  Her with 2 days onset gross hemauturia, pelvic pressure, and maybe some mild increase frequency, but Denies urinary symptoms such as dysuria, urgency, flank pain, or n/v, fever, chills. No prior hx of same, except had episode of small blood in urine 2 wks ago, cleared after 2 days.   No hx of renal stones. Stil smoking but trying to cut down. Wt Readings from Last 3 Encounters:  06/14/16 131 lb (59.4 kg)  05/06/16 131 lb (59.4 kg)  11/26/15 130 lb (59 kg)  Pt continues to have recurring LBP , mild anooying more than anything else after lumbar surgury Nov 03, 2015.  without change in severity, bowel or bladder change, fever, wt loss,  worsening LE pain/numbness/weakness, gait change or falls.   /Pt denies chest pain, increased sob or doe, wheezing, orthopnea, PND, increased LE swelling, palpitations, dizziness or syncope.  Pt denies new neurological symptoms such as new headache, or facial or extremity weakness or numbness  Pt denies polydipsia, polyuria. Denies worsening depressive symptoms, suicidal ideation, or panic Past Medical History:  Diagnosis Date  . ABDOMINAL TENDERNESS 01/21/2011  . ACUTE GINGIVITIS NONPLAQUE INDUCED 06/11/2010  . ADD 09/05/2008  . Alcohol abuse, in remission 09/27/2007  . ALLERGIC RHINITIS 03/02/2009  . ANXIETY 07/02/2007  . BACK PAIN 05/19/2009  . CHRONIC OBSTRUCTIVE PULMONARY DISEASE, ACUTE EXACERBATION 08/06/2009  . COPD 07/02/2007  . DEGENERATIVE JOINT DISEASE, CERVICAL SPINE 07/02/2007  . DEPRESSION 07/02/2007  . GASTROENTERITIS, VIRAL 11/29/2010  . GERD 01/21/2011  . Headache(784.0) 07/02/2007  . HYPOTHYROIDISM 07/02/2007  . Lumbar disc disease   . Lumbar pain with radiation down right leg 06/01/2011  . OSTEOARTHRITIS 07/02/2007  . Other and unspecified ovarian cyst 05/19/2009  . PAIN, CHRONIC, DUE TO TRAUMA 07/02/2007  . PELVIC  PAIN 03/29/2010  . UTI 05/19/2009  .  Vitamin D insufficiency    Past Surgical History:  Procedure Laterality Date  . ABDOMINAL HYSTERECTOMY    . CHOLECYSTECTOMY      reports that she has been smoking.  She does not have any smokeless tobacco history on file. She reports that she does not drink alcohol or use drugs. family history includes Alcohol abuse in her other; Anxiety disorder in her other; Coronary artery disease in her other; Depression in her other; Stroke in her other. Allergies  Allergen Reactions  . Morphine Nausea And Vomiting   Current Outpatient Prescriptions on File Prior to Visit  Medication Sig Dispense Refill  . ALPRAZolam (XANAX) 0.5 MG tablet Take 1 tablet (0.5 mg total) by mouth at bedtime as needed. 30 tablet 5  . amphetamine-dextroamphetamine (ADDERALL) 30 MG tablet TAKE ONE (1) TABLET BY MOUTH TWO (2) TIMES DAILY 60 tablet 0  . estradiol (ESTRACE) 1 MG tablet TAKE ONE (1) TABLET BY MOUTH EVERY DAY 90 tablet 3  . hydrochlorothiazide (MICROZIDE) 12.5 MG capsule Take 1 capsule (12.5 mg total) by mouth daily. As needed 30 capsule 11  . ibuprofen (ADVIL,MOTRIN) 600 MG tablet Take 600 mg by mouth every 8 (eight) hours as needed for moderate pain. Reported on 11/26/2015    . rosuvastatin (CRESTOR) 10 MG tablet Take 1 tablet (10 mg total) by mouth daily. 90 tablet 3  . [DISCONTINUED] FLUoxetine (PROZAC) 20 MG capsule Take 1 capsule (20 mg total) by mouth daily. 30 capsule 11   No current facility-administered medications on file prior to visit.  Review of Systems  Constitutional: Negative for unusual diaphoresis or night sweats HENT: Negative for ear swelling or discharge Eyes: Negative for worsening visual haziness  Respiratory: Negative for choking and stridor.   Gastrointestinal: Negative for distension or worsening eructation Genitourinary: Negative for retention or change in urine volume.  Musculoskeletal: Negative for other MSK pain or swelling Skin: Negative for color change and worsening  wound Neurological: Negative for tremors and numbness other than noted  Psychiatric/Behavioral: Negative for decreased concentration or agitation other than above       Objective:   Physical Exam BP 124/78   Pulse 89   Temp 97.9 F (36.6 C) (Oral)   Resp 20   Wt 131 lb (59.4 kg)   SpO2 97%   BMI 21.80 kg/m  VS noted,  Constitutional: Pt appears in no apparent distress HENT: Head: NCAT.  Right Ear: External ear normal.  Left Ear: External ear normal.  Eyes: . Pupils are equal, round, and reactive to light. Conjunctivae and EOM are normal Neck: Normal range of motion. Neck supple.  Cardiovascular: Normal rate and regular rhythm.   Pulmonary/Chest: Effort normal and breath sounds without rales or wheezing.  Abd:  Soft, ND, + BS with + mild low mid abd tender, without guarding or rebound Neurological: Pt is alert. Not confused , motor grossly intact Skin: Skin is warm. No rash, no LE edema Psychiatric: Pt behavior is normal. No agitation. not depressed affect   Udip today   Ref Range & Units 15:27 65mo ago 39yr ago   Color, UA  brown     Clarity, UA  cloudy     Glucose, UA  negative     Bilirubin, UA  negative     Ketones, UA  negative     Spec Grav, UA  1.025     Blood, UA  3+     pH, UA  6.0     Protein, UA  2+     Urobilinogen, UA  negative 0.2 0.2   Nitrite, UA  negative     Leukocytes, UA Negative small (1+)          Assessment & Plan:

## 2016-06-14 NOTE — Telephone Encounter (Signed)
Called patient left message to give us a call back 

## 2016-06-14 NOTE — Patient Instructions (Signed)
Your specimen will be sent for culture  Please take all new medication as prescribed - the antibiotic  If the culture is negative, we will need to refer to Urology  Please continue all other medications as before, and refills have been done if requested.  Please have the pharmacy call with any other refills you may need.  Please keep your appointments with your specialists as you may have planned

## 2016-06-16 ENCOUNTER — Other Ambulatory Visit: Payer: Self-pay | Admitting: Family

## 2016-06-16 MED ORDER — CIPROFLOXACIN HCL 500 MG PO TABS: 500.0000 mg | ORAL_TABLET | Freq: Two times a day (BID) | ORAL | 0 refills | Status: DC

## 2016-06-17 LAB — CULTURE, URINE COMPREHENSIVE

## 2016-06-19 NOTE — Assessment & Plan Note (Signed)
stable overall by history and exam, recent data reviewed with pt, and pt to continue medical treatment as before,  to f/u any worsening symptoms or concerns @LASTSO2 (3)@

## 2016-06-19 NOTE — Assessment & Plan Note (Signed)
stable overall by history and exam, and pt to continue medical treatment as before,  to f/u any worsening symptoms or concerns 

## 2016-06-19 NOTE — Assessment & Plan Note (Signed)
Asympt, stable overall by history and exam, recent data reviewed with pt, and pt to continue medical treatment as before,  to f/u any worsening symptoms or concerns Lab Results  Component Value Date   WBC 8.9 12/17/2015   HGB 13.6 12/17/2015   HCT 41.5 12/17/2015   PLT 401.0 (H) 12/17/2015   GLUCOSE 112 (H) 12/17/2015   CHOL 236 (H) 12/17/2015   TRIG 168.0 (H) 12/17/2015   HDL 69.80 12/17/2015   LDLDIRECT 121.4 12/02/2010   LDLCALC 132 (H) 12/17/2015   ALT 13 12/17/2015   AST 11 12/17/2015   NA 138 12/17/2015   K 3.9 12/17/2015   CL 104 12/17/2015   CREATININE 0.84 12/17/2015   BUN 16 12/17/2015   CO2 25 12/17/2015   TSH 0.85 12/17/2015

## 2016-06-19 NOTE — Assessment & Plan Note (Signed)
Most likely c/w UTI, for empiric antibx, urine cx, and  to f/u any worsening symptoms or concerns, will need urology referral due to the gross hematuria if cx not definitive

## 2016-06-20 ENCOUNTER — Encounter: Payer: Self-pay | Admitting: Internal Medicine

## 2016-07-27 ENCOUNTER — Ambulatory Visit (INDEPENDENT_AMBULATORY_CARE_PROVIDER_SITE_OTHER): Payer: 59 | Admitting: Internal Medicine

## 2016-07-27 ENCOUNTER — Encounter: Payer: Self-pay | Admitting: Internal Medicine

## 2016-07-27 VITALS — BP 132/76 | HR 98 | Temp 98.9°F | Resp 20 | Wt 135.0 lb

## 2016-07-27 DIAGNOSIS — J441 Chronic obstructive pulmonary disease with (acute) exacerbation: Secondary | ICD-10-CM | POA: Diagnosis not present

## 2016-07-27 DIAGNOSIS — J309 Allergic rhinitis, unspecified: Secondary | ICD-10-CM

## 2016-07-27 DIAGNOSIS — F988 Other specified behavioral and emotional disorders with onset usually occurring in childhood and adolescence: Secondary | ICD-10-CM

## 2016-07-27 DIAGNOSIS — F909 Attention-deficit hyperactivity disorder, unspecified type: Secondary | ICD-10-CM

## 2016-07-27 MED ORDER — ALBUTEROL SULFATE HFA 108 (90 BASE) MCG/ACT IN AERS: 2.0000 | INHALATION_SPRAY | Freq: Four times a day (QID) | RESPIRATORY_TRACT | 11 refills | Status: DC | PRN

## 2016-07-27 MED ORDER — AMPHETAMINE-DEXTROAMPHETAMINE 30 MG PO TABS: ORAL_TABLET | ORAL | 0 refills | Status: DC

## 2016-07-27 MED ORDER — METHYLPREDNISOLONE ACETATE 80 MG/ML IJ SUSP
80.0000 mg | Freq: Once | INTRAMUSCULAR | Status: AC
  Administered 2016-07-27: 80 mg via INTRAMUSCULAR

## 2016-07-27 MED ORDER — PREDNISONE 10 MG PO TABS: ORAL_TABLET | ORAL | 0 refills | Status: DC

## 2016-07-27 MED ORDER — AMPHETAMINE-DEXTROAMPHETAMINE 30 MG PO TABS
ORAL_TABLET | ORAL | 0 refills | Status: DC
Start: 2016-07-27 — End: 2016-07-27

## 2016-07-27 NOTE — Progress Notes (Signed)
Pre visit review using our clinic review tool, if applicable. No additional management support is needed unless otherwise documented below in the visit note. 

## 2016-07-27 NOTE — Patient Instructions (Signed)
You had the steroid shot today  Please take all new medication as prescribed - the prednisone as prescribed, and the inhaler as needed  Please continue all other medications as before, and refills have been done if requested.  Please have the pharmacy call with any other refills you may need.  Please keep your appointments with your specialists as you may have planned

## 2016-07-27 NOTE — Progress Notes (Signed)
Subjective:    Patient ID: Kathy Vargas, female    DOB: 07-12-1951, 65 y.o.   MRN: [id number]  HPI  Here with c/o allergies assoc with worsening sob - Does have several wks ongoing nasal allergy symptoms with clearish congestion, itch and sneezing, without fever, pain, ST, cough, swelling , but has had 2 days worsening ST, non prod cough, wheezing, mild sob/doe as well.  Denies worsening depressive symptoms, suicidal ideation, or panic; has ongoing anxiety, not increased recently.  ADD meds working well, asks for refill.  No recent wt loss or adverse effect.  Pt denies new neurological symptoms such as new headache, or facial or extremity weakness or numbness   Pt denies polydipsia, polyuria Past Medical History:  Diagnosis Date  . ABDOMINAL TENDERNESS 01/21/2011  . ACUTE GINGIVITIS NONPLAQUE INDUCED 06/11/2010  . ADD 09/05/2008  . Alcohol abuse, in remission 09/27/2007  . ALLERGIC RHINITIS 03/02/2009  . ANXIETY 07/02/2007  . BACK PAIN 05/19/2009  . CHRONIC OBSTRUCTIVE PULMONARY DISEASE, ACUTE EXACERBATION 08/06/2009  . COPD 07/02/2007  . DEGENERATIVE JOINT DISEASE, CERVICAL SPINE 07/02/2007  . DEPRESSION 07/02/2007  . GASTROENTERITIS, VIRAL 11/29/2010  . GERD 01/21/2011  . Headache(784.0) 07/02/2007  . HYPOTHYROIDISM 07/02/2007  . Lumbar disc disease   . Lumbar pain with radiation down right leg 06/01/2011  . OSTEOARTHRITIS 07/02/2007  . Other and unspecified ovarian cyst 05/19/2009  . PAIN, CHRONIC, DUE TO TRAUMA 07/02/2007  . PELVIC  PAIN 03/29/2010  . UTI 05/19/2009  . Vitamin D insufficiency    Past Surgical History:  Procedure Laterality Date  . ABDOMINAL HYSTERECTOMY    . CHOLECYSTECTOMY      reports that she has been smoking.  She does not have any smokeless tobacco history on file. She reports that she does not drink alcohol or use drugs. family history includes Alcohol abuse in her other; Anxiety disorder in her other; Coronary artery disease in her other; Depression in her other; Stroke  in her other. Allergies  Allergen Reactions  . Morphine Nausea And Vomiting   Current Outpatient Prescriptions on File Prior to Visit  Medication Sig Dispense Refill  . ALPRAZolam (XANAX) 0.5 MG tablet Take 1 tablet (0.5 mg total) by mouth at bedtime as needed. 30 tablet 5  . estradiol (ESTRACE) 1 MG tablet TAKE ONE (1) TABLET BY MOUTH EVERY DAY 90 tablet 3  . hydrochlorothiazide (MICROZIDE) 12.5 MG capsule Take 1 capsule (12.5 mg total) by mouth daily. As needed 30 capsule 11  . ibuprofen (ADVIL,MOTRIN) 600 MG tablet Take 600 mg by mouth every 8 (eight) hours as needed for moderate pain. Reported on 11/26/2015    . rosuvastatin (CRESTOR) 10 MG tablet Take 1 tablet (10 mg total) by mouth daily. 90 tablet 3  . [DISCONTINUED] FLUoxetine (PROZAC) 20 MG capsule Take 1 capsule (20 mg total) by mouth daily. 30 capsule 11   No current facility-administered medications on file prior to visit.    Review of Systems  Constitutional: Negative for unusual diaphoresis or night sweats HENT: Negative for ear swelling or discharge Eyes: Negative for worsening visual haziness  Respiratory: Negative for choking and stridor.   Gastrointestinal: Negative for distension or worsening eructation Genitourinary: Negative for retention or change in urine volume.  Musculoskeletal: Negative for other MSK pain or swelling Skin: Negative for color change and worsening wound Neurological: Negative for tremors and numbness other than noted  Psychiatric/Behavioral: Negative for decreased concentration or agitation other than above       Objective:  Physical Exam BP 132/76   Pulse 98   Temp 98.9 F (37.2 C) (Oral)   Resp 20   Wt 135 lb (61.2 kg)   SpO2 98%   BMI 22.47 kg/m  VS noted,  Constitutional: Pt appears in no apparent distress HENT: Head: NCAT.  Right Ear: External ear normal.  Left Ear: External ear normal. Bilat tm's with mild erythema.  Max sinus areas non tender.  Pharynx with mild erythema, no  exudate Eyes: . Pupils are equal, round, and reactive to light. Conjunctivae and EOM are normal Neck: Normal range of motion. Neck supple. No neck LA or tenderness Cardiovascular: Normal rate and regular rhythm.   Pulmonary/Chest: Effort normal and breath sounds decreased bilat without rales but with few scattered wheezing.  Neurological: Pt is alert. Not confused , motor grossly intact Skin: Skin is warm. No rash, no LE edema Psychiatric: Pt behavior is normal. No agitation.     Assessment & Plan:

## 2016-07-31 NOTE — Assessment & Plan Note (Signed)
Mild to mod, for depomedrol IM, predpac asd,,  to f/u any worsening symptoms or concerns 

## 2016-07-31 NOTE — Assessment & Plan Note (Signed)
stable overall by history and exam, recent data reviewed with pt, and pt to continue medical treatment as before,  to f/u any worsening symptoms or concerns Lab Results  Component Value Date   WBC 8.9 12/17/2015   HGB 13.6 12/17/2015   HCT 41.5 12/17/2015   PLT 401.0 (H) 12/17/2015   GLUCOSE 112 (H) 12/17/2015   CHOL 236 (H) 12/17/2015   TRIG 168.0 (H) 12/17/2015   HDL 69.80 12/17/2015   LDLDIRECT 121.4 12/02/2010   LDLCALC 132 (H) 12/17/2015   ALT 13 12/17/2015   AST 11 12/17/2015   NA 138 12/17/2015   K 3.9 12/17/2015   CL 104 12/17/2015   CREATININE 0.84 12/17/2015   BUN 16 12/17/2015   CO2 25 12/17/2015   TSH 0.85 12/17/2015

## 2016-07-31 NOTE — Assessment & Plan Note (Addendum)
Mild to mod, for depomedrol IM, predpac asd,,  to f/u any worsening symptoms or concerns, cont all other inhaler

## 2016-08-03 ENCOUNTER — Encounter: Payer: Self-pay | Admitting: Internal Medicine

## 2016-08-03 ENCOUNTER — Ambulatory Visit (INDEPENDENT_AMBULATORY_CARE_PROVIDER_SITE_OTHER): Payer: 59 | Admitting: Internal Medicine

## 2016-08-03 VITALS — BP 138/78 | HR 86 | Temp 98.2°F | Resp 20 | Wt 131.0 lb

## 2016-08-03 DIAGNOSIS — J441 Chronic obstructive pulmonary disease with (acute) exacerbation: Secondary | ICD-10-CM

## 2016-08-03 DIAGNOSIS — R05 Cough: Secondary | ICD-10-CM

## 2016-08-03 DIAGNOSIS — R739 Hyperglycemia, unspecified: Secondary | ICD-10-CM

## 2016-08-03 DIAGNOSIS — R059 Cough, unspecified: Secondary | ICD-10-CM

## 2016-08-03 MED ORDER — HYDROCODONE-HOMATROPINE 5-1.5 MG/5ML PO SYRP
5.0000 mL | ORAL_SOLUTION | Freq: Four times a day (QID) | ORAL | 0 refills | Status: AC | PRN
Start: 2016-08-03 — End: 2016-08-13

## 2016-08-03 MED ORDER — LEVOFLOXACIN 500 MG PO TABS: 500.0000 mg | ORAL_TABLET | Freq: Every day | ORAL | 0 refills | Status: AC

## 2016-08-03 NOTE — Progress Notes (Signed)
Pre visit review using our clinic review tool, if applicable. No additional management support is needed unless otherwise documented below in the visit note. 

## 2016-08-03 NOTE — Progress Notes (Signed)
Subjective:    Patient ID: Kathy Vargas, female    DOB: 1950-12-15, 65 y.o.   MRN: [id number]  HPI  Here with persistent now scant to mild prod cough, quite distressing to her, for over 1 wk assoc with stress incontinence leakage now having to wear pads and HA with cough.  ? Low grade temp, but no high fever, chills, worsening CP.  Pt denies chest pain, increased sob or doe, wheezing, orthopnea, PND, increased LE swelling, palpitations, dizziness or syncope, in fact wheezing/sob has improved with steroid tx.  Felt too poorly today to go to work due to cough and fatigue.  Has not even gone to the gym for her regular daily exercise for several days.  Pt denies new neurological symptoms such as new headache, or facial or extremity weakness or numbness   Pt denies polydipsia, polyuria, or low sugar symptoms such as weakness or confusion improved with po intake.  Pt states overall good compliance with meds Past Medical History:  Diagnosis Date  . ABDOMINAL TENDERNESS 01/21/2011  . ACUTE GINGIVITIS NONPLAQUE INDUCED 06/11/2010  . ADD 09/05/2008  . Alcohol abuse, in remission 09/27/2007  . ALLERGIC RHINITIS 03/02/2009  . ANXIETY 07/02/2007  . BACK PAIN 05/19/2009  . CHRONIC OBSTRUCTIVE PULMONARY DISEASE, ACUTE EXACERBATION 08/06/2009  . COPD 07/02/2007  . DEGENERATIVE JOINT DISEASE, CERVICAL SPINE 07/02/2007  . DEPRESSION 07/02/2007  . GASTROENTERITIS, VIRAL 11/29/2010  . GERD 01/21/2011  . Headache(784.0) 07/02/2007  . HYPOTHYROIDISM 07/02/2007  . Lumbar disc disease   . Lumbar pain with radiation down right leg 06/01/2011  . OSTEOARTHRITIS 07/02/2007  . Other and unspecified ovarian cyst 05/19/2009  . PAIN, CHRONIC, DUE TO TRAUMA 07/02/2007  . PELVIC  PAIN 03/29/2010  . UTI 05/19/2009  . Vitamin D insufficiency    Past Surgical History:  Procedure Laterality Date  . ABDOMINAL HYSTERECTOMY    . CHOLECYSTECTOMY      reports that she has been smoking.  She does not have any smokeless tobacco history on  file. She reports that she does not drink alcohol or use drugs. family history includes Alcohol abuse in her other; Anxiety disorder in her other; Coronary artery disease in her other; Depression in her other; Stroke in her other. Allergies  Allergen Reactions  . Morphine Nausea And Vomiting   Current Outpatient Prescriptions on File Prior to Visit  Medication Sig Dispense Refill  . albuterol (PROVENTIL HFA;VENTOLIN HFA) 108 (90 Base) MCG/ACT inhaler Inhale 2 puffs into the lungs every 6 (six) hours as needed for wheezing or shortness of breath. 1 Inhaler 11  . ALPRAZolam (XANAX) 0.5 MG tablet Take 1 tablet (0.5 mg total) by mouth at bedtime as needed. 30 tablet 5  . amphetamine-dextroamphetamine (ADDERALL) 30 MG tablet TAKE ONE (1) TABLET BY MOUTH TWO (2) TIMES DAILY 60 tablet 0  . estradiol (ESTRACE) 1 MG tablet TAKE ONE (1) TABLET BY MOUTH EVERY DAY 90 tablet 3  . hydrochlorothiazide (MICROZIDE) 12.5 MG capsule Take 1 capsule (12.5 mg total) by mouth daily. As needed 30 capsule 11  . ibuprofen (ADVIL,MOTRIN) 600 MG tablet Take 600 mg by mouth every 8 (eight) hours as needed for moderate pain. Reported on 11/26/2015    . predniSONE (DELTASONE) 10 MG tablet 4tab by mouth x3day,3 tabs x 3day,2tab x 3day,1tab x 3day 30 tablet 0  . rosuvastatin (CRESTOR) 10 MG tablet Take 1 tablet (10 mg total) by mouth daily. 90 tablet 3  . [DISCONTINUED] FLUoxetine (PROZAC) 20 MG capsule Take 1 capsule (20  mg total) by mouth daily. 30 capsule 11   No current facility-administered medications on file prior to visit.    Review of Systems  Constitutional: Negative for unusual diaphoresis or night sweats HENT: Negative for ear swelling or discharge Eyes: Negative for worsening visual haziness  Respiratory: Negative for choking and stridor.   Gastrointestinal: Negative for distension or worsening eructation Genitourinary: Negative for retention or change in urine volume.  Musculoskeletal: Negative for other MSK  pain or swelling Skin: Negative for color change and worsening wound Neurological: Negative for tremors and numbness other than noted  Psychiatric/Behavioral: Negative for decreased concentration or agitation other than above       Objective:   Physical Exam BP 138/78   Pulse 86   Temp 98.2 F (36.8 C) (Oral)   Resp 20   Wt 131 lb (59.4 kg)   SpO2 97%   BMI 21.80 kg/m  VS noted,  Constitutional: Pt appears in no apparent distress HENT: Head: NCAT.  Right Ear: External ear normal.  Left Ear: External ear normal.  Eyes: . Pupils are equal, round, and reactive to light. Conjunctivae and EOM are normal Neck: Normal range of motion. Neck supple.  Cardiovascular: Normal rate and regular rhythm.   Pulmonary/Chest: Effort normal and breath sounds decreased without rales or wheezing.  Abd:  Soft, NT, ND, + BS Neurological: Pt is alert. Not confused , motor grossly intact Skin: Skin is warm. No rash, no LE edema Psychiatric: Pt behavior is normal. No agitation.         Assessment & Plan:

## 2016-08-03 NOTE — Patient Instructions (Addendum)
Please take all new medication as prescribed - the antibiotic, and cough medicine  Please continue all other medications as before, and refills have been done if requested.  Please have the pharmacy call with any other refills you may need.  Please keep your appointments with your specialists as you may have planned  Please go to the XRAY Department in the Basement (go straight as you get off the elevator) for the x-ray testing tomorrow  You will be contacted by phone if any changes need to be made immediately.  Otherwise, you will receive a letter about your results with an explanation, but please check with MyChart first.  Please remember to sign up for MyChart if you have not done so, as this will be important to you in the future with finding out test results, communicating by private email, and scheduling acute appointments online when needed.  You are given the work note

## 2016-08-03 NOTE — Assessment & Plan Note (Signed)
stable overall by history and exam, recent data reviewed with pt, and pt to continue medical treatment as before,  to f/u any worsening symptoms or concerns; pt to call for any worsening cbg > 200 or onset polys

## 2016-08-03 NOTE — Assessment & Plan Note (Signed)
Mild to mod, c/w possible bronchitis vs pna, for antibx course, cough med prn, and cxr (declines today, but will try tomorrow)  to f/u any worsening symptoms or concerns

## 2016-08-03 NOTE — Assessment & Plan Note (Signed)
Improved, to finish predpac asd,  to f/u any worsening symptoms or concerns

## 2016-09-27 ENCOUNTER — Ambulatory Visit (INDEPENDENT_AMBULATORY_CARE_PROVIDER_SITE_OTHER): Payer: 59

## 2016-09-27 DIAGNOSIS — Z23 Encounter for immunization: Secondary | ICD-10-CM

## 2016-11-15 ENCOUNTER — Other Ambulatory Visit: Payer: Self-pay | Admitting: Internal Medicine

## 2016-11-15 NOTE — Telephone Encounter (Signed)
Done hardcopy to Corinne  

## 2016-11-16 NOTE — Telephone Encounter (Signed)
faxed

## 2016-11-29 ENCOUNTER — Ambulatory Visit (INDEPENDENT_AMBULATORY_CARE_PROVIDER_SITE_OTHER): Payer: BLUE CROSS/BLUE SHIELD | Admitting: Internal Medicine

## 2016-11-29 ENCOUNTER — Encounter: Payer: Self-pay | Admitting: Internal Medicine

## 2016-11-29 VITALS — BP 138/80 | HR 87 | Temp 98.2°F | Resp 20 | Wt 132.0 lb

## 2016-11-29 DIAGNOSIS — Z0000 Encounter for general adult medical examination without abnormal findings: Secondary | ICD-10-CM

## 2016-11-29 DIAGNOSIS — J441 Chronic obstructive pulmonary disease with (acute) exacerbation: Secondary | ICD-10-CM | POA: Diagnosis not present

## 2016-11-29 DIAGNOSIS — R059 Cough, unspecified: Secondary | ICD-10-CM

## 2016-11-29 DIAGNOSIS — E559 Vitamin D deficiency, unspecified: Secondary | ICD-10-CM | POA: Diagnosis not present

## 2016-11-29 DIAGNOSIS — R5383 Other fatigue: Secondary | ICD-10-CM | POA: Diagnosis not present

## 2016-11-29 DIAGNOSIS — R05 Cough: Secondary | ICD-10-CM | POA: Diagnosis not present

## 2016-11-29 DIAGNOSIS — Z0001 Encounter for general adult medical examination with abnormal findings: Secondary | ICD-10-CM

## 2016-11-29 MED ORDER — AMPHETAMINE-DEXTROAMPHETAMINE 30 MG PO TABS: ORAL_TABLET | ORAL | 0 refills | Status: DC

## 2016-11-29 MED ORDER — METHYLPREDNISOLONE ACETATE 80 MG/ML IJ SUSP
80.0000 mg | Freq: Once | INTRAMUSCULAR | Status: AC
  Administered 2016-11-29: 80 mg via INTRAMUSCULAR

## 2016-11-29 MED ORDER — HYDROCODONE-HOMATROPINE 5-1.5 MG/5ML PO SYRP: 5.0000 mL | ORAL_SOLUTION | Freq: Four times a day (QID) | ORAL | 0 refills | Status: AC | PRN

## 2016-11-29 MED ORDER — DICLOFENAC SODIUM 1 % TD GEL: 4.0000 g | Freq: Four times a day (QID) | TRANSDERMAL | 11 refills | Status: DC | PRN

## 2016-11-29 MED ORDER — ALPRAZOLAM 0.5 MG PO TABS: 0.5000 mg | ORAL_TABLET | Freq: Every evening | ORAL | 5 refills | Status: DC | PRN

## 2016-11-29 MED ORDER — LEVOFLOXACIN 250 MG PO TABS: 250.0000 mg | ORAL_TABLET | Freq: Every day | ORAL | 0 refills | Status: AC

## 2016-11-29 MED ORDER — PREDNISONE 10 MG PO TABS: ORAL_TABLET | ORAL | 0 refills | Status: DC

## 2016-11-29 NOTE — Assessment & Plan Note (Signed)

## 2016-11-29 NOTE — Assessment & Plan Note (Signed)
Etiology unclear, also for B12 level

## 2016-11-29 NOTE — Assessment & Plan Note (Signed)
With hx of deficiency, for f/u lab in light of symptoms

## 2016-11-29 NOTE — Patient Instructions (Addendum)
You had the steroid shot today  Please take all new medication as prescribed - the antibiotic, cough medicine if needed, and prednisone  Please take all new medication as prescribed - the voltaren gel as needed for hand pain  Please continue all other medications as before, and refills have been done if requested.  Please have the pharmacy call with any other refills you may need.  Please continue your efforts at being more active, low cholesterol diet, and weight control.  You are otherwise up to date with prevention measures today.  Please keep your appointments with your specialists as you may have planned  Please go to the LAB in the Basement (turn left off the elevator) for the tests to be done today, including the B12 and VIt D  You will be contacted by phone if any changes need to be made immediately.  Otherwise, you will receive a letter about your results with an explanation, but please check with MyChart first.  Please remember to sign up for MyChart if you have not done so, as this will be important to you in the future with finding out test results, communicating by private email, and scheduling acute appointments online when needed.  Please return in 6 months, or sooner if needed

## 2016-11-29 NOTE — Progress Notes (Signed)
Pre visit review using our clinic review tool, if applicable. No additional management support is needed unless otherwise documented below in the visit note. 

## 2016-11-29 NOTE — Progress Notes (Signed)
Subjective:    Patient ID: Kathy Vargas, female    DOB: [birth year], [age] y.o.   MRN: [id number]  HPI  Here for wellness and f/u;  Overall doing ok;  Pt denies Chest pain, orthopnea, PND, worsening LE edema, palpitations, dizziness or syncope.  Pt denies neurological change such as new headache, facial or extremity weakness.  Pt denies polydipsia, polyuria, or low sugar symptoms. Pt states overall good compliance with treatment and medications, good tolerability, and has been trying to follow appropriate diet.  Pt denies worsening depressive symptoms, suicidal ideation or panic. No fever, night sweats, wt loss, loss of appetite, or other constitutional symptoms.  Pt states good ability with ADL's, has low fall risk, home safety reviewed and adequate, no other significant changes in hearing or vision, and  occasionally active with exercise with gym 3 times per wk  . Incidentally  Here with 2-3 days acute onset fever, facial pain, pressure, headache, general weakness and malaise, and greenish d/c, with mild ST and cough, also chest heaviness, sob and wheezing/doe. but pt denies chest pain, orthopnea, PND, increased LE swelling, palpitations, dizziness or syncope.  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  Past Medical History:  Diagnosis Date  . ABDOMINAL TENDERNESS 01/21/2011  . ACUTE GINGIVITIS NONPLAQUE INDUCED 06/11/2010  . ADD 09/05/2008  . Alcohol abuse, in remission 09/27/2007  . ALLERGIC RHINITIS 03/02/2009  . ANXIETY 07/02/2007  . BACK PAIN 05/19/2009  . CHRONIC OBSTRUCTIVE PULMONARY DISEASE, ACUTE EXACERBATION 08/06/2009  . COPD 07/02/2007  .  DEGENERATIVE JOINT DISEASE, CERVICAL SPINE 07/02/2007  . DEPRESSION 07/02/2007  . GASTROENTERITIS, VIRAL 11/29/2010  . GERD 01/21/2011  . Headache(784.0) 07/02/2007  . HYPOTHYROIDISM 07/02/2007  . Lumbar disc disease   . Lumbar pain with radiation down right leg 06/01/2011  . OSTEOARTHRITIS 07/02/2007  . Other and unspecified ovarian cyst 05/19/2009  . PAIN, CHRONIC, DUE TO TRAUMA 07/02/2007  . PELVIC  PAIN 03/29/2010  . UTI 05/19/2009  . Vitamin D insufficiency    Past Surgical History:  Procedure Laterality Date  . ABDOMINAL HYSTERECTOMY    . CHOLECYSTECTOMY      reports that she has been smoking.  She does not have any smokeless tobacco history on file. She reports that she does not drink alcohol or use drugs. family history includes Alcohol abuse in her other; Anxiety disorder in her other; Coronary artery disease in her other; Depression in her other; Stroke in her other. Allergies  Allergen Reactions  . Morphine Nausea And Vomiting   Current Outpatient Prescriptions on File Prior to Visit  Medication Sig Dispense Refill  . albuterol (PROVENTIL HFA;VENTOLIN HFA) 108 (90 Base) MCG/ACT inhaler Inhale 2 puffs into the lungs every 6 (six) hours as needed for wheezing or shortness of breath. 1 Inhaler 11  . ALPRAZolam (XANAX) 0.5 MG tablet TAKE ONE TABLET BY MOUTH EVERY NIGHT AT BEDTIME AS NEEDED 30 tablet 5  . amphetamine-dextroamphetamine (ADDERALL) 30 MG tablet TAKE ONE (1) TABLET BY MOUTH TWO (2) TIMES DAILY 60 tablet 0  . estradiol (ESTRACE) 1 MG tablet TAKE ONE (1) TABLET BY MOUTH EVERY DAY 90 tablet  3  . hydrochlorothiazide (MICROZIDE) 12.5 MG capsule Take 1 capsule (12.5 mg total) by mouth daily. As needed 30 capsule 11  . ibuprofen (ADVIL,MOTRIN) 600 MG tablet Take 600 mg by mouth every 8 (eight) hours as needed for moderate pain. Reported on 11/26/2015    . rosuvastatin (CRESTOR) 10 MG tablet Take 1 tablet (10 mg total) by mouth daily. 90 tablet 3  . [DISCONTINUED] FLUoxetine (PROZAC) 20  MG capsule Take 1 capsule (20 mg total) by mouth daily. 30 capsule 11   No current facility-administered medications on file prior to visit.    Review of Systems Constitutional: Negative for increased diaphoresis, or other activity, appetite or siginficant weight change other than noted HENT: Negative for worsening hearing loss, ear pain, facial swelling, mouth sores and neck stiffness.   Eyes: Negative for other worsening pain, redness or visual disturbance.  Respiratory: Negative for choking or stridor Cardiovascular: Negative for other chest pain and palpitations.  Gastrointestinal: Negative for worsening diarrhea, blood in stool, or abdominal distention Genitourinary: Negative for hematuria, flank pain or change in urine volume.  Musculoskeletal: Negative for myalgias or other joint complaints.  Skin: Negative for other color change and wound or drainage.  Neurological: Negative for syncope and numbness. other than noted Hematological: Negative for adenopathy. or other swelling Psychiatric/Behavioral: Negative for hallucinations, SI, self-injury, decreased concentration or other worsening agitation.  All other system neg per pt    Objective:   Physical Exam BP 138/80   Pulse 87   Temp 98.2 F (36.8 C) (Oral)   Resp 20   Wt 132 lb (59.9 kg)   SpO2 98%   BMI 21.97 kg/m  VS noted, mild ill Constitutional: Pt is oriented to person, place, and time. Appears well-developed and well-nourished, in no significant distress Head: Normocephalic and atraumatic  Eyes: Conjunctivae and EOM are normal. Pupils are equal, round, and reactive to light Right Ear: External ear normal.  Left Ear: External ear normal Nose: Nose normal.  Bilat tm's with mild erythema.  Max sinus areas non tender.  Pharynx with mild erythema, no exudate Mouth/Throat: Oropharynx is clear and moist  Neck: Normal range of motion. Neck supple. No JVD present. No tracheal deviation present or significant neck LA or  mass Cardiovascular: Normal rate, regular rhythm, normal heart sounds and intact distal pulses.   Pulmonary/Chest: Effort normal and breath sounds decreased without rales but with few bilat wheezing  Abdominal: Soft. Bowel sounds are normal. NT. No HSM  Musculoskeletal: Normal range of motion. Exhibits no edema Lymphadenopathy: Has no cervical adenopathy.  Neurological: Pt is alert and oriented to person, place, and time. Pt has normal reflexes. No cranial nerve deficit. Motor grossly intact Skin: Skin is warm and dry. No rash noted or new ulcers Psychiatric:  Has nervous mood and affect. Behavior is normal.  No other new exam findings    Assessment & Plan:

## 2016-11-29 NOTE — Assessment & Plan Note (Signed)
Mild to mod, c/w bronchitis vs pna, declines cxr, for antibx course, to f/u any worsening symptoms or concerns  

## 2016-11-29 NOTE — Assessment & Plan Note (Addendum)
Mild to mod, for depomedrol Im 80, predpac asd, cough med prn,,  to f/u any worsening symptoms or concerns  In addition to the time spent performing CPE, I spent an additional 15 minutes face to face,in which greater than 50% of this time was spent in counseling and coordination of care for patient's illness as documented.

## 2017-05-30 ENCOUNTER — Encounter: Payer: Self-pay | Admitting: Internal Medicine

## 2017-05-30 ENCOUNTER — Ambulatory Visit (INDEPENDENT_AMBULATORY_CARE_PROVIDER_SITE_OTHER): Payer: BLUE CROSS/BLUE SHIELD | Admitting: Internal Medicine

## 2017-05-30 VITALS — BP 122/78 | HR 81 | Ht 65.0 in | Wt 127.0 lb

## 2017-05-30 DIAGNOSIS — M545 Low back pain, unspecified: Secondary | ICD-10-CM

## 2017-05-30 DIAGNOSIS — Z0000 Encounter for general adult medical examination without abnormal findings: Secondary | ICD-10-CM | POA: Diagnosis not present

## 2017-05-30 DIAGNOSIS — Z23 Encounter for immunization: Secondary | ICD-10-CM | POA: Diagnosis not present

## 2017-05-30 MED ORDER — AMPHETAMINE-DEXTROAMPHETAMINE 30 MG PO TABS: ORAL_TABLET | ORAL | 0 refills | Status: DC

## 2017-05-30 MED ORDER — METHYLPREDNISOLONE ACETATE 80 MG/ML IJ SUSP
80.0000 mg | Freq: Once | INTRAMUSCULAR | Status: AC
  Administered 2017-05-30: 80 mg via INTRAMUSCULAR

## 2017-05-30 MED ORDER — ALPRAZOLAM 0.5 MG PO TABS: 0.5000 mg | ORAL_TABLET | Freq: Every evening | ORAL | 5 refills | Status: DC | PRN

## 2017-05-30 MED ORDER — ESTRADIOL 1 MG PO TABS: ORAL_TABLET | ORAL | 3 refills | Status: DC

## 2017-05-30 MED ORDER — ROSUVASTATIN CALCIUM 10 MG PO TABS: 10.0000 mg | ORAL_TABLET | Freq: Every day | ORAL | 3 refills | Status: DC

## 2017-05-30 NOTE — Progress Notes (Signed)
Subjective:    Patient ID: Kathy Vargas, female    DOB: [birth year], 66 y.o.   MRN: [id number]  HPI  Here for wellness and f/u;  Overall doing ok;  Pt denies Chest pain, worsening SOB, DOE, wheezing, orthopnea, PND, worsening LE edema, palpitations, dizziness or syncope.  Pt denies neurological change such as new headache, facial or extremity weakness.  Pt denies polydipsia, polyuria, or low sugar symptoms. Pt states overall good compliance with treatment and medications, good tolerability, and has been trying to follow appropriate diet.  Pt denies worsening depressive symptoms, suicidal ideation or panic. No fever, night sweats, wt loss, loss of appetite, or other constitutional symptoms.  Pt states good ability with ADL's, has low fall risk, home safety reviewed and adequate, no other significant changes in hearing or vision, and not active with exercise. Pt continues to have recurring left LBP (s/p surgury for right sided pain in the past per Dr Dutch QuintPoole, no bowel or bladder change, fever, wt loss,  worsening LE numbness/weakness, gait change or falls. But having some pain to the left leg, asks for depomedrol IM today. 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   Past Medical History:  Diagnosis Date  . ABDOMINAL TENDERNESS 01/21/2011  . ACUTE GINGIVITIS NONPLAQUE INDUCED 06/11/2010  . ADD 09/05/2008  . Alcohol abuse, in remission 09/27/2007  . ALLERGIC RHINITIS 03/02/2009  . ANXIETY 07/02/2007  . BACK PAIN 05/19/2009  . CHRONIC OBSTRUCTIVE PULMONARY DISEASE, ACUTE EXACERBATION 08/06/2009  . COPD 07/02/2007  . DEGENERATIVE JOINT DISEASE, CERVICAL SPINE 07/02/2007  . DEPRESSION 07/02/2007  . GASTROENTERITIS, VIRAL 11/29/2010  . GERD 01/21/2011  . Headache(784.0) 07/02/2007  . HYPOTHYROIDISM 07/02/2007  . Lumbar disc disease   . Lumbar pain with radiation down right leg 06/01/2011  . OSTEOARTHRITIS 07/02/2007  . Other and unspecified ovarian cyst 05/19/2009  . PAIN,  CHRONIC, DUE TO TRAUMA 07/02/2007  . PELVIC  PAIN 03/29/2010  . UTI 05/19/2009  . Vitamin D insufficiency    Past Surgical History:  Procedure Laterality Date  . ABDOMINAL HYSTERECTOMY    . CHOLECYSTECTOMY      reports that she has been smoking.  She has never used smokeless tobacco. She reports that she does not drink alcohol or use drugs. family history includes Alcohol abuse in her other; Anxiety disorder in her other; Coronary artery disease in her other; Depression in her other; Stroke in her other. Allergies  Allergen Reactions  . Morphine Nausea And Vomiting   Current Outpatient Prescriptions on File Prior to Visit  Medication Sig Dispense Refill  . albuterol (PROVENTIL HFA;VENTOLIN HFA) 108 (90 Base) MCG/ACT inhaler Inhale 2 puffs into the lungs every 6 (six) hours as needed for wheezing or shortness of breath. 1 Inhaler 11  . diclofenac sodium (VOLTAREN) 1 % GEL Apply 4 g topically 4 (four) times daily as needed. 400 g 11  . hydrochlorothiazide (MICROZIDE) 12.5 MG capsule Take 1 capsule (12.5 mg total) by mouth daily. As needed 30 capsule 11  . ibuprofen (ADVIL,MOTRIN) 600 MG tablet Take 600 mg by mouth every 8 (eight) hours as needed for moderate pain. Reported on 11/26/2015    . [DISCONTINUED] FLUoxetine (PROZAC) 20 MG capsule Take 1 capsule (20 mg total) by mouth daily. 30 capsule 11   No current facility-administered medications on file prior to visit.    Review of Systems Constitutional: Negative for other unusual diaphoresis, sweats, appetite or weight changes HENT: Negative for other worsening hearing loss, ear pain,  facial swelling, mouth sores or neck stiffness.   Eyes: Negative for other worsening pain, redness or other visual disturbance.  Respiratory: Negative for other stridor or swelling Cardiovascular: Negative for other palpitations or other chest pain  Gastrointestinal: Negative for worsening diarrhea or loose stools, blood in stool, distention or other  pain Genitourinary: Negative for hematuria, flank pain or other change in urine volume.  Musculoskeletal: Negative for myalgias or other joint swelling.  Skin: Negative for other color change, or other wound or worsening drainage.  Neurological: Negative for other syncope or numbness. Hematological: Negative for other adenopathy or swelling Psychiatric/Behavioral: Negative for hallucinations, other worsening agitation, SI, self-injury, or new decreased concentration All other system neg per pt    Objective:   Physical Exam BP 122/78   Pulse 81   Ht 5\' 5"  (1.651 m)   Wt 127 lb (57.6 kg)   SpO2 100%   BMI 21.13 kg/m  VS noted,  Constitutional: Pt is oriented to person, place, and time. Appears well-developed and well-nourished, in no significant distress and comfortable Head: Normocephalic and atraumatic  Eyes: Conjunctivae and EOM are normal. Pupils are equal, round, and reactive to light Right Ear: External ear normal without discharge Left Ear: External ear normal without discharge Nose: Nose without discharge or deformity Mouth/Throat: Oropharynx is without other ulcerations and moist  Neck: Normal range of motion. Neck supple. No JVD present. No tracheal deviation present or significant neck LA or mass Cardiovascular: Normal rate, regular rhythm, normal heart sounds and intact distal pulses.   Pulmonary/Chest: WOB normal and breath sounds without rales or wheezing  Abdominal: Soft. Bowel sounds are normal. NT. No HSM  Musculoskeletal: Normal range of motion. Exhibits no edema Spine: nontender but with left lumbar paravertebral tender Lymphadenopathy: Has no other cervical adenopathy.  Neurological: Pt is alert and oriented to person, place, and time. Pt has normal reflexes. No cranial nerve deficit. Motor grossly intact, Gait intact Skin: Skin is warm and dry. No rash noted or new ulcerations Psychiatric:  Has normal mood and affect. Behavior is normal without agitation No  other exam findings    Assessment & Plan:

## 2017-05-30 NOTE — Patient Instructions (Addendum)
You had the Pneumovax shot today, and the depomedrol steroid shot today  Please continue all other medications as before, and refills have been done if requested.  Please have the pharmacy call with any other refills you may need.  Please continue your efforts at being more active, low cholesterol diet, and weight control.  You are otherwise up to date with prevention measures today.  Please keep your appointments with your specialists as you may have planned  Please go to the LAB in the Basement (turn left off the elevator) for the tests to be done at your convenience  You will be contacted by phone if any changes need to be made immediately.  Otherwise, you will receive a letter about your results with an explanation, but please check with MyChart first.  Please remember to sign up for MyChart if you have not done so, as this will be important to you in the future with finding out test results, communicating by private email, and scheduling acute appointments online when needed.  Please return in 1 year for your yearly visit, or sooner if needed, with Lab testing done 3-5 days before

## 2017-06-01 NOTE — Assessment & Plan Note (Signed)

## 2017-06-01 NOTE — Assessment & Plan Note (Signed)
Pt requests depomedrol IM 80 as this has helped previously

## 2017-06-12 ENCOUNTER — Other Ambulatory Visit (INDEPENDENT_AMBULATORY_CARE_PROVIDER_SITE_OTHER): Payer: BLUE CROSS/BLUE SHIELD

## 2017-06-12 DIAGNOSIS — Z0000 Encounter for general adult medical examination without abnormal findings: Secondary | ICD-10-CM

## 2017-06-12 LAB — CBC WITH DIFFERENTIAL/PLATELET
BASOS PCT: 1.1 % (ref 0.0–3.0)
Basophils Absolute: 0.1 10*3/uL (ref 0.0–0.1)
EOS PCT: 2.4 % (ref 0.0–5.0)
Eosinophils Absolute: 0.2 10*3/uL (ref 0.0–0.7)
HCT: 39.7 % (ref 36.0–46.0)
HEMOGLOBIN: 13.2 g/dL (ref 12.0–15.0)
Lymphocytes Relative: 33.2 % (ref 12.0–46.0)
Lymphs Abs: 2.5 10*3/uL (ref 0.7–4.0)
MCHC: 33.3 g/dL (ref 30.0–36.0)
MCV: 88.3 fl (ref 78.0–100.0)
MONO ABS: 0.6 10*3/uL (ref 0.1–1.0)
Monocytes Relative: 7.6 % (ref 3.0–12.0)
Neutro Abs: 4.2 10*3/uL (ref 1.4–7.7)
Neutrophils Relative %: 55.7 % (ref 43.0–77.0)
Platelets: 331 10*3/uL (ref 150.0–400.0)
RBC: 4.49 Mil/uL (ref 3.87–5.11)
RDW: 13.1 % (ref 11.5–15.5)
WBC: 7.5 10*3/uL (ref 4.0–10.5)

## 2017-06-12 LAB — HEPATIC FUNCTION PANEL
ALBUMIN: 4 g/dL (ref 3.5–5.2)
ALT: 17 U/L (ref 0–35)
AST: 19 U/L (ref 0–37)
Alkaline Phosphatase: 81 U/L (ref 39–117)
Bilirubin, Direct: 0.1 mg/dL (ref 0.0–0.3)
TOTAL PROTEIN: 6.7 g/dL (ref 6.0–8.3)
Total Bilirubin: 0.4 mg/dL (ref 0.2–1.2)

## 2017-06-12 LAB — LIPID PANEL
CHOLESTEROL: 225 mg/dL — AB (ref 0–200)
HDL: 81.6 mg/dL (ref >39.00)
LDL CALC: 120 mg/dL — AB (ref 0–99)
NonHDL: 143.31
TRIGLYCERIDES: 118 mg/dL (ref 0.0–149.0)
Total CHOL/HDL Ratio: 3
VLDL: 23.6 mg/dL (ref 0.0–40.0)

## 2017-06-12 LAB — BASIC METABOLIC PANEL
BUN: 16 mg/dL (ref 6–23)
CALCIUM: 9.5 mg/dL (ref 8.4–10.5)
CO2: 26 meq/L (ref 19–32)
Chloride: 103 mEq/L (ref 96–112)
Creatinine, Ser: 0.86 mg/dL (ref 0.40–1.20)
GFR: 70.17 mL/min (ref >60.00)
GLUCOSE: 102 mg/dL — AB (ref 70–99)
Potassium: 4 mEq/L (ref 3.5–5.1)
Sodium: 136 mEq/L (ref 135–145)

## 2017-06-12 LAB — TSH: TSH: 1.83 u[IU]/mL (ref 0.35–4.50)

## 2017-06-13 ENCOUNTER — Encounter: Payer: Self-pay | Admitting: Internal Medicine

## 2017-08-15 ENCOUNTER — Other Ambulatory Visit (INDEPENDENT_AMBULATORY_CARE_PROVIDER_SITE_OTHER): Payer: BLUE CROSS/BLUE SHIELD

## 2017-08-15 ENCOUNTER — Ambulatory Visit (INDEPENDENT_AMBULATORY_CARE_PROVIDER_SITE_OTHER): Payer: BLUE CROSS/BLUE SHIELD | Admitting: Internal Medicine

## 2017-08-15 ENCOUNTER — Encounter: Payer: Self-pay | Admitting: Internal Medicine

## 2017-08-15 VITALS — BP 122/78 | HR 76 | Temp 98.2°F | Ht 65.0 in | Wt 130.0 lb

## 2017-08-15 DIAGNOSIS — R509 Fever, unspecified: Secondary | ICD-10-CM

## 2017-08-15 DIAGNOSIS — Z23 Encounter for immunization: Secondary | ICD-10-CM

## 2017-08-15 DIAGNOSIS — J438 Other emphysema: Secondary | ICD-10-CM

## 2017-08-15 DIAGNOSIS — R739 Hyperglycemia, unspecified: Secondary | ICD-10-CM | POA: Diagnosis not present

## 2017-08-15 MED ORDER — LEVOFLOXACIN 250 MG PO TABS: 250.0000 mg | ORAL_TABLET | Freq: Every day | ORAL | 0 refills | Status: DC

## 2017-08-15 MED ORDER — LEVOFLOXACIN 250 MG PO TABS
250.0000 mg | ORAL_TABLET | Freq: Every day | ORAL | 0 refills | Status: DC
Start: 2017-08-15 — End: 2017-12-12

## 2017-08-15 NOTE — Patient Instructions (Signed)
Please take all new medication as prescribed - the anitbiotic  Please continue all other medications as before, and refills have been done if requested.  Please have the pharmacy call with any other refills you may need..  Please keep your appointments with your specialists as you may have planned  Please go to the XRAY Department in the Basement (go straight as you get off the elevator) for the x-ray testing  Please go to the LAB in the Basement (turn left off the elevator) for the tests to be done today  You will be contacted by phone if any changes need to be made immediately.  Otherwise, you will receive a letter about your results with an explanation, but please check with MyChart first.  Please remember to sign up for MyChart if you have not done so, as this will be important to you in the future with finding out test results, communicating by private email, and scheduling acute appointments online when needed.

## 2017-08-15 NOTE — Progress Notes (Signed)
Subjective:    Patient ID: Kathy Vargas, female    DOB: 1951-01-26, 66 y.o.   MRN: [id number]  HPI  Here with up to 1 month onset feeling "terrible" with intermittent low grade temps, fatigue and occasional chills seeming to be worse x 2-3 days with recurring n/v and vague low abd pain.  Was recently tx for UTI approx 1 mo ago, Denies urinary symptoms such as dysuria, frequency, urgency, flank pain, hematuria.  No ST , cough and Pt denies chest pain, increased sob or doe, wheezing, orthopnea, PND, increased LE swelling, palpitations, dizziness or syncope.n  Pt denies polydipsia, polyuria Past Medical History:  Diagnosis Date  . ABDOMINAL TENDERNESS 01/21/2011  . ACUTE GINGIVITIS NONPLAQUE INDUCED 06/11/2010  . ADD 09/05/2008  . Alcohol abuse, in remission 09/27/2007  . ALLERGIC RHINITIS 03/02/2009  . ANXIETY 07/02/2007  . BACK PAIN 05/19/2009  . CHRONIC OBSTRUCTIVE PULMONARY DISEASE, ACUTE EXACERBATION 08/06/2009  . COPD 07/02/2007  . DEGENERATIVE JOINT DISEASE, CERVICAL SPINE 07/02/2007  . DEPRESSION 07/02/2007  . GASTROENTERITIS, VIRAL 11/29/2010  . GERD 01/21/2011  . Headache(784.0) 07/02/2007  . HYPOTHYROIDISM 07/02/2007  . Lumbar disc disease   . Lumbar pain with radiation down right leg 06/01/2011  . OSTEOARTHRITIS 07/02/2007  . Other and unspecified ovarian cyst 05/19/2009  . PAIN, CHRONIC, DUE TO TRAUMA 07/02/2007  . PELVIC  PAIN 03/29/2010  . UTI 05/19/2009  . Vitamin D insufficiency    Past Surgical History:  Procedure Laterality Date  . ABDOMINAL HYSTERECTOMY    . CHOLECYSTECTOMY      reports that she has been smoking.  She has never used smokeless tobacco. She reports that she does not drink alcohol or use drugs. family history includes Alcohol abuse in her other; Anxiety disorder in her other; Coronary artery disease in her other; Depression in her other; Stroke in her other. Allergies  Allergen Reactions  . Morphine Nausea And Vomiting   Current Outpatient Prescriptions on File  Prior to Visit  Medication Sig Dispense Refill  . albuterol (PROVENTIL HFA;VENTOLIN HFA) 108 (90 Base) MCG/ACT inhaler Inhale 2 puffs into the lungs every 6 (six) hours as needed for wheezing or shortness of breath. 1 Inhaler 11  . ALPRAZolam (XANAX) 0.5 MG tablet Take 1 tablet (0.5 mg total) by mouth at bedtime as needed. 30 tablet 5  . amphetamine-dextroamphetamine (ADDERALL) 30 MG tablet TAKE ONE (1) TABLET BY MOUTH TWO (2) TIMES DAILY 60 tablet 0  . diclofenac sodium (VOLTAREN) 1 % GEL Apply 4 g topically 4 (four) times daily as needed. 400 g 11  . estradiol (ESTRACE) 1 MG tablet TAKE ONE (1) TABLET BY MOUTH EVERY DAY 90 tablet 3  . hydrochlorothiazide (MICROZIDE) 12.5 MG capsule Take 1 capsule (12.5 mg total) by mouth daily. As needed 30 capsule 11  . ibuprofen (ADVIL,MOTRIN) 600 MG tablet Take 600 mg by mouth every 8 (eight) hours as needed for moderate pain. Reported on 11/26/2015    . rosuvastatin (CRESTOR) 10 MG tablet Take 1 tablet (10 mg total) by mouth daily. 90 tablet 3  . [DISCONTINUED] FLUoxetine (PROZAC) 20 MG capsule Take 1 capsule (20 mg total) by mouth daily. 30 capsule 11   No current facility-administered medications on file prior to visit.    Review of Systems  Constitutional: Negative for other unusual diaphoresis or sweats HENT: Negative for ear discharge or swelling Eyes: Negative for other worsening visual disturbances Respiratory: Negative for stridor or other swelling  Gastrointestinal: Negative for worsening distension or other blood Genitourinary:  Negative for retention or other urinary change Musculoskeletal: Negative for other MSK pain or swelling Skin: Negative for color change or other new lesions Neurological: Negative for worsening tremors and other numbness  Psychiatric/Behavioral: Negative for worsening agitation or other fatigue All other system neg per pt    Objective:   Physical Exam BP 122/78   Pulse 76   Temp 98.2 F (36.8 C) (Oral)   Ht   (1.651 m)   Wt 130 lb (59 kg)   SpO2 99%   BMI 21.63 kg/m  VS noted, mild ill and fatigued appearing Constitutional: Pt appears in NAD HENT: Head: NCAT.  Right Ear: External ear normal.  Left Ear: External ear normal.  Eyes: . Pupils are equal, round, and reactive to light. Conjunctivae and EOM are normal Nose: without d/c or deformity Neck: Neck supple. Gross normal ROM Cardiovascular: Normal rate and regular rhythm.   Pulmonary/Chest: Effort normal and breath sounds decreased without rales or wheezing.  Abd:  Soft, ND, + BS, no organomegaly with mild low mid abd tender without guarding or rebound Neurological: Pt is alert. At baseline orientation, motor grossly intact Skin: Skin is warm. No rashes, other new lesions, no LE edema Psychiatric: Pt behavior is normal without agitation  No other exam findings    Assessment & Plan:

## 2017-08-16 ENCOUNTER — Telehealth: Payer: Self-pay

## 2017-08-16 ENCOUNTER — Other Ambulatory Visit: Payer: Self-pay | Admitting: Internal Medicine

## 2017-08-16 DIAGNOSIS — R059 Cough, unspecified: Secondary | ICD-10-CM

## 2017-08-16 DIAGNOSIS — R05 Cough: Secondary | ICD-10-CM

## 2017-08-16 LAB — URINALYSIS, ROUTINE W REFLEX MICROSCOPIC
BILIRUBIN URINE: NEGATIVE
Ketones, ur: NEGATIVE
Nitrite: POSITIVE — AB
Specific Gravity, Urine: >=1.03 — AB (ref 1.000–1.030)
Total Protein, Urine: 30 — AB
UROBILINOGEN UA: 0.2 (ref 0.0–1.0)
Urine Glucose: NEGATIVE
pH: 6 (ref 5.0–8.0)

## 2017-08-16 NOTE — Assessment & Plan Note (Signed)
stable overall by history and exam, and pt to continue medical treatment as before,  to f/u any worsening symptoms or concerns, pt to f/u with onset new polys or cbg > 200

## 2017-08-16 NOTE — Assessment & Plan Note (Addendum)
Etiology not obvious but suspect recurring UTI on balance of evidence on hx and exam, for urine studies, and empiric tx with antibiotic pending culture result, for cxr as well to r/o subclinical PNA

## 2017-08-16 NOTE — Telephone Encounter (Signed)
FYI

## 2017-08-16 NOTE — Telephone Encounter (Signed)
Patient has called back.  States that she will get xray done tomorrow afternoon (9/27)

## 2017-08-16 NOTE — Telephone Encounter (Signed)
-----   Message from Corwin Levins, MD sent at 08/16/2017  1:08 PM EDT ----- Ok for Kathy Vargas to contact pt -   UA is c/w UTI, but we were still hoping for a cxr if she would (I have placed the order)

## 2017-08-16 NOTE — Telephone Encounter (Signed)
Called pt, LVM.   

## 2017-08-16 NOTE — Assessment & Plan Note (Signed)
No evidence for exacerbation, stable overall by history and exam, and pt to continue medical treatment as before,  to f/u any worsening symptoms or concerns

## 2017-08-17 ENCOUNTER — Encounter: Payer: Self-pay | Admitting: Internal Medicine

## 2017-08-17 ENCOUNTER — Telehealth: Payer: Self-pay

## 2017-08-17 ENCOUNTER — Ambulatory Visit (INDEPENDENT_AMBULATORY_CARE_PROVIDER_SITE_OTHER)
Admission: RE | Admit: 2017-08-17 | Discharge: 2017-08-17 | Disposition: A | Discharge status: Home / Self Care | Payer: BLUE CROSS/BLUE SHIELD | Source: Ambulatory Visit | Attending: Internal Medicine | Admitting: Internal Medicine

## 2017-08-17 DIAGNOSIS — R05 Cough: Secondary | ICD-10-CM

## 2017-08-17 DIAGNOSIS — R059 Cough, unspecified: Secondary | ICD-10-CM

## 2017-08-17 NOTE — Telephone Encounter (Signed)
Per Victorino Dike pt came by wanting to discuss UA and culture wanting a printout. She was under the impression nothing was wrong with her urine but I see that no results had been release for her to see. Please advise if treatment is necessary.

## 2017-08-17 NOTE — Telephone Encounter (Signed)
The most recent UA and culture are abnormal and consistent with infection.  The urine culture results should be "completely" back in another 1-2 days  OK to continue the current antibx

## 2017-08-17 NOTE — Telephone Encounter (Signed)
Pt has been informed and expressed understanding.  

## 2017-08-18 ENCOUNTER — Telehealth: Payer: Self-pay

## 2017-08-18 ENCOUNTER — Encounter: Payer: Self-pay | Admitting: Internal Medicine

## 2017-08-18 LAB — URINE CULTURE
MICRO NUMBER:: 81060784
SPECIMEN QUALITY: ADEQUATE

## 2017-08-18 NOTE — Telephone Encounter (Signed)
-----   Message from Corwin Levins, MD sent at 08/18/2017 12:56 PM EDT ----- Letter sent, cont same tx  OK to let pt know the antibiotic should be effective based on her culture results

## 2017-08-18 NOTE — Telephone Encounter (Signed)
Called pt, LVM.   

## 2017-10-06 ENCOUNTER — Other Ambulatory Visit: Payer: Self-pay | Admitting: Internal Medicine

## 2017-10-06 MED ORDER — AMPHETAMINE-DEXTROAMPHETAMINE 30 MG PO TABS: ORAL_TABLET | ORAL | 0 refills | Status: DC

## 2017-10-06 NOTE — Telephone Encounter (Signed)
3 mo done erx

## 2017-11-30 ENCOUNTER — Ambulatory Visit: Payer: BLUE CROSS/BLUE SHIELD | Admitting: Internal Medicine

## 2017-12-07 ENCOUNTER — Encounter (HOSPITAL_COMMUNITY): Payer: Self-pay | Admitting: *Deleted

## 2017-12-07 ENCOUNTER — Emergency Department (HOSPITAL_COMMUNITY)
Admission: EM | Admit: 2017-12-07 | Discharge: 2017-12-07 | Disposition: A | Discharge status: Home / Self Care | Payer: BLUE CROSS/BLUE SHIELD | Attending: Emergency Medicine | Admitting: Emergency Medicine

## 2017-12-07 ENCOUNTER — Emergency Department (HOSPITAL_COMMUNITY): Payer: BLUE CROSS/BLUE SHIELD

## 2017-12-07 DIAGNOSIS — J449 Chronic obstructive pulmonary disease, unspecified: Secondary | ICD-10-CM | POA: Diagnosis not present

## 2017-12-07 DIAGNOSIS — J189 Pneumonia, unspecified organism: Secondary | ICD-10-CM

## 2017-12-07 DIAGNOSIS — J111 Influenza due to unidentified influenza virus with other respiratory manifestations: Secondary | ICD-10-CM | POA: Diagnosis not present

## 2017-12-07 DIAGNOSIS — F17200 Nicotine dependence, unspecified, uncomplicated: Secondary | ICD-10-CM | POA: Diagnosis not present

## 2017-12-07 DIAGNOSIS — R05 Cough: Secondary | ICD-10-CM | POA: Diagnosis not present

## 2017-12-07 DIAGNOSIS — Z79899 Other long term (current) drug therapy: Secondary | ICD-10-CM | POA: Diagnosis not present

## 2017-12-07 DIAGNOSIS — E039 Hypothyroidism, unspecified: Secondary | ICD-10-CM | POA: Diagnosis not present

## 2017-12-07 DIAGNOSIS — R5383 Other fatigue: Secondary | ICD-10-CM | POA: Diagnosis not present

## 2017-12-07 DIAGNOSIS — R69 Illness, unspecified: Secondary | ICD-10-CM

## 2017-12-07 DIAGNOSIS — R509 Fever, unspecified: Secondary | ICD-10-CM | POA: Diagnosis not present

## 2017-12-07 DIAGNOSIS — M791 Myalgia, unspecified site: Secondary | ICD-10-CM | POA: Diagnosis not present

## 2017-12-07 LAB — CBC WITH DIFFERENTIAL/PLATELET
BASOS ABS: 0 10*3/uL (ref 0.0–0.1)
BASOS PCT: 0 %
Eosinophils Absolute: 0 10*3/uL (ref 0.0–0.7)
Eosinophils Relative: 0 %
HEMATOCRIT: 38.6 % (ref 36.0–46.0)
Hemoglobin: 13 g/dL (ref 12.0–15.0)
LYMPHS PCT: 8 %
Lymphs Abs: 1.1 10*3/uL (ref 0.7–4.0)
MCH: 28.6 pg (ref 26.0–34.0)
MCHC: 33.7 g/dL (ref 30.0–36.0)
MCV: 84.8 fL (ref 78.0–100.0)
MONO ABS: 1.1 10*3/uL — AB (ref 0.1–1.0)
Monocytes Relative: 8 %
NEUTROS ABS: 11.3 10*3/uL — AB (ref 1.7–7.7)
NEUTROS PCT: 84 %
Platelets: 287 10*3/uL (ref 150–400)
RBC: 4.55 MIL/uL (ref 3.87–5.11)
RDW: 12.8 % (ref 11.5–15.5)
WBC: 13.5 10*3/uL — AB (ref 4.0–10.5)

## 2017-12-07 LAB — URINALYSIS, ROUTINE W REFLEX MICROSCOPIC
BILIRUBIN URINE: NEGATIVE
Glucose, UA: NEGATIVE mg/dL
KETONES UR: 20 mg/dL — AB
NITRITE: POSITIVE — AB
PH: 6 (ref 5.0–8.0)
Protein, ur: 30 mg/dL — AB
SPECIFIC GRAVITY, URINE: 1.009 (ref 1.005–1.030)

## 2017-12-07 LAB — COMPREHENSIVE METABOLIC PANEL
ALBUMIN: 3.5 g/dL (ref 3.5–5.0)
ALK PHOS: 78 U/L (ref 38–126)
ALT: 12 U/L — ABNORMAL LOW (ref 14–54)
ANION GAP: 7 (ref 5–15)
AST: 15 U/L (ref 15–41)
BILIRUBIN TOTAL: 1.1 mg/dL (ref 0.3–1.2)
BUN: 13 mg/dL (ref 6–20)
CALCIUM: 8.9 mg/dL (ref 8.9–10.3)
CO2: 23 mmol/L (ref 22–32)
Chloride: 98 mmol/L — ABNORMAL LOW (ref 101–111)
Creatinine, Ser: 0.69 mg/dL (ref 0.44–1.00)
GFR calc non Af Amer: >60 mL/min (ref >60)
GLUCOSE: 112 mg/dL — AB (ref 65–99)
POTASSIUM: 3.7 mmol/L (ref 3.5–5.1)
Sodium: 128 mmol/L — ABNORMAL LOW (ref 135–145)
TOTAL PROTEIN: 6.9 g/dL (ref 6.5–8.1)

## 2017-12-07 LAB — INFLUENZA PANEL BY PCR (TYPE A & B)
INFLAPCR: NEGATIVE
Influenza B By PCR: NEGATIVE

## 2017-12-07 MED ORDER — SODIUM CHLORIDE 0.9 % IV BOLUS (SEPSIS)
1000.0000 mL | Freq: Once | INTRAVENOUS | Status: AC
  Administered 2017-12-07: 1000 mL via INTRAVENOUS

## 2017-12-07 MED ORDER — ACETAMINOPHEN 500 MG PO TABS
1000.0000 mg | ORAL_TABLET | Freq: Once | ORAL | Status: AC
  Administered 2017-12-07: 1000 mg via ORAL
  Filled 2017-12-07: qty 2

## 2017-12-07 MED ORDER — AZITHROMYCIN 250 MG PO TABS: 250.0000 mg | ORAL_TABLET | Freq: Every day | ORAL | 0 refills | Status: AC

## 2017-12-07 MED ORDER — METHYLPREDNISOLONE SODIUM SUCC 125 MG IJ SOLR
125.0000 mg | INTRAMUSCULAR | Status: AC
  Administered 2017-12-07: 125 mg via INTRAVENOUS
  Filled 2017-12-07: qty 2

## 2017-12-07 MED ORDER — PREDNISONE 20 MG PO TABS: 40.0000 mg | ORAL_TABLET | Freq: Every day | ORAL | 0 refills | Status: DC

## 2017-12-07 MED ORDER — AZITHROMYCIN 500 MG IV SOLR
500.0000 mg | Freq: Once | INTRAVENOUS | Status: AC
  Administered 2017-12-07: 500 mg via INTRAVENOUS
  Filled 2017-12-07: qty 500

## 2017-12-07 MED ORDER — KETOROLAC TROMETHAMINE 30 MG/ML IJ SOLN
15.0000 mg | Freq: Once | INTRAMUSCULAR | Status: AC
  Administered 2017-12-07: 15 mg via INTRAVENOUS
  Filled 2017-12-07: qty 1

## 2017-12-07 NOTE — ED Triage Notes (Signed)
Pt complains of generalized body aches, chills, neck pain, headache since yesterday.

## 2017-12-07 NOTE — ED Provider Notes (Signed)
[facility]-EMERGENCY DEPT Provider Note   CSN: [id number] Arrival date & time: 12/07/17  1007     History   Chief Complaint Chief Complaint  Patient presents with  . Generalized Body Aches  . Headache  . Chills    HPI Kathy Vargas is a 67 y.o. female.  HPI  Presents with generalized illness for 1 week. She notes that over the past week she has become progressively more sore all over, with intermittent fever, chills, cough. She is not complaining of focal pain anywhere, headache. Rather, the patient states that everything she moves feels sore, generally uncomfortable. No relief with OTC medication. Patient is a smoker, has a history of hypertension, but states that she is otherwise well.  Past Medical History:  Diagnosis Date  . ABDOMINAL TENDERNESS 01/21/2011  . ACUTE GINGIVITIS NONPLAQUE INDUCED 06/11/2010  . ADD 09/05/2008  . Alcohol abuse, in remission 09/27/2007  . ALLERGIC RHINITIS 03/02/2009  . ANXIETY 07/02/2007  . BACK PAIN 05/19/2009  . CHRONIC OBSTRUCTIVE PULMONARY DISEASE, ACUTE EXACERBATION 08/06/2009  . COPD 07/02/2007  . DEGENERATIVE JOINT DISEASE, CERVICAL SPINE 07/02/2007  . DEPRESSION 07/02/2007  . GASTROENTERITIS, VIRAL 11/29/2010  . GERD 01/21/2011  . Headache(784.0) 07/02/2007  . HYPOTHYROIDISM 07/02/2007  . Lumbar disc disease   . Lumbar pain with radiation down right leg 06/01/2011  . OSTEOARTHRITIS 07/02/2007  . Other and unspecified ovarian cyst 05/19/2009  . PAIN, CHRONIC, DUE TO TRAUMA 07/02/2007  . PELVIC  PAIN 03/29/2010  . UTI 05/19/2009  . Vitamin D insufficiency     Patient Active Problem List   Diagnosis Date Noted  . Fever 08/15/2017  . Left low back pain 05/30/2017  . Gross hematuria 06/14/2016  . Hyperlipidemia 05/06/2016  . Hyperglycemia 05/06/2016  . Venous (peripheral) insufficiency 05/06/2016  . COPD exacerbation (HCC) 04/15/2015  . Insomnia 04/15/2015  . Fatigue 10/15/2014  . Smoker 10/15/2014  . Chronic pain  syndrome 02/21/2014  . Cough 12/25/2013  . Angioedema 09/24/2013  . Menopausal problem 02/09/2012  . Lumbar pain with radiation down right leg 06/01/2011  . Preventative health care 05/29/2011  . GERD 01/21/2011  . Vitamin D deficiency 11/29/2010  . ACUTE GINGIVITIS NONPLAQUE INDUCED 06/11/2010  . PELVIC  PAIN 03/29/2010  . DISC DISEASE, LUMBAR 08/06/2009  . Other and unspecified ovarian cyst 05/19/2009  . BACK PAIN 05/19/2009  . Allergic rhinitis 03/02/2009  . Attention deficit disorder 09/05/2008  . Alcohol abuse, in remission 09/27/2007  . Hypothyroidism 07/02/2007  . Anxiety state 07/02/2007  . Depression 07/02/2007  . PAIN, CHRONIC, DUE TO TRAUMA 07/02/2007  . COPD (chronic obstructive pulmonary disease) (HCC) 07/02/2007  . OSTEOARTHRITIS 07/02/2007  . DEGENERATIVE JOINT DISEASE, CERVICAL SPINE 07/02/2007    Past Surgical History:  Procedure Laterality Date  . ABDOMINAL HYSTERECTOMY    . CHOLECYSTECTOMY      OB History    No data available       Home Medications    Prior to Admission medications   Medication Sig Start Date End Date Taking? Authorizing Provider  ALPRAZolam Prudy Feeler) 0.5 MG tablet Take 1 tablet (0.5 mg total) by mouth at bedtime as needed. Patient taking differently: Take 0.5 mg by mouth at bedtime as needed for anxiety.  05/30/17  Yes Corwin Levins, MD  amphetamine-dextroamphetamine (ADDERALL) 30 MG tablet TAKE 1 TABLET BY MOUTH 2 TIMES DAILY. Patient taking differently: Take 30 mg by mouth 2 (two) times daily.  10/06/17  Yes Corwin Levins, MD  diclofenac sodium (VOLTAREN) 1 %  GEL Apply 4 g topically 4 (four) times daily as needed. Patient taking differently: Apply 4 g topically 4 (four) times daily as needed (pain).  11/29/16  Yes Corwin LevinsJohn, James W, MD  ibuprofen (ADVIL,MOTRIN) 200 MG tablet Take 200 mg by mouth every 8 (eight) hours as needed for moderate pain.  02/15/14  Yes Seabron SpatesLowne Chase, Yvonne R, DO  albuterol (PROVENTIL HFA;VENTOLIN HFA) 108 (90 Base)  MCG/ACT inhaler Inhale 2 puffs into the lungs every 6 (six) hours as needed for wheezing or shortness of breath. Patient not taking: Reported on 12/07/2017 07/27/16   Corwin LevinsJohn, James W, MD  estradiol (ESTRACE) 1 MG tablet TAKE ONE (1) TABLET BY MOUTH EVERY DAY Patient not taking: Reported on 12/07/2017 05/30/17   Corwin LevinsJohn, James W, MD  hydrochlorothiazide (MICROZIDE) 12.5 MG capsule Take 1 capsule (12.5 mg total) by mouth daily. As needed Patient not taking: Reported on 12/07/2017 05/06/16   Corwin LevinsJohn, James W, MD  levofloxacin (LEVAQUIN) 250 MG tablet Take 1 tablet (250 mg total) by mouth daily. Patient not taking: Reported on 12/07/2017 08/15/17   Corwin LevinsJohn, James W, MD  rosuvastatin (CRESTOR) 10 MG tablet Take 1 tablet (10 mg total) by mouth daily. Patient not taking: Reported on 12/07/2017 05/30/17   Corwin LevinsJohn, James W, MD    Family History Family History  Problem Relation Age of Onset  . Alcohol abuse Other   . Anxiety disorder Other   . Coronary artery disease Other   . Depression Other   . Stroke Other     Social History Social History   Tobacco Use  . Smoking status: Current Every Day Smoker  . Smokeless tobacco: Never Used  Substance Use Topics  . Alcohol use: No  . Drug use: No     Allergies   Morphine and Hydrochlorothiazide   Review of Systems Review of Systems  Constitutional: Positive for fatigue and fever.  HENT:       Per HPI, otherwise negative  Respiratory:       Per HPI, otherwise negative  Cardiovascular:       Per HPI, otherwise negative  Gastrointestinal: Negative for vomiting.  Endocrine:       Negative aside from HPI  Genitourinary:       Neg aside from HPI   Musculoskeletal:       Per HPI, otherwise negative  Skin: Negative.   Neurological: Positive for weakness. Negative for syncope.     Physical Exam Updated Vital Signs BP (!) 138/109 (BP Location: Left Arm)   Pulse 84   Temp 99.4 F (37.4 C) (Oral)   Resp 18   Ht 5' 4.5" (1.638 m)   Wt 59 kg (130 lb)    SpO2 99%   BMI 21.97 kg/m   Physical Exam  Constitutional: She is oriented to person, place, and time. She appears well-developed and well-nourished. No distress.  HENT:  Head: Normocephalic and atraumatic.  Eyes: Conjunctivae and EOM are normal.  Neck: No neck rigidity. No edema present.  Cardiovascular: Normal rate and regular rhythm.  Pulmonary/Chest: Effort normal and breath sounds normal. No stridor. No respiratory distress.  Abdominal: She exhibits no distension.  Musculoskeletal: She exhibits no edema.  Neurological: She is alert and oriented to person, place, and time. No cranial nerve deficit.  Skin: Skin is warm and dry.  Erythematous changes superior to the clavicles  Psychiatric: She has a normal mood and affect.  Nursing note and vitals reviewed.    ED Treatments / Results  Labs (all labs  ordered are listed, but only abnormal results are displayed) Labs Reviewed  COMPREHENSIVE METABOLIC PANEL - Abnormal; Notable for the following components:      Result Value   Sodium 128 (*)    Chloride 98 (*)    Glucose, Bld 112 (*)    ALT 12 (*)    All other components within normal limits  CBC WITH DIFFERENTIAL/PLATELET - Abnormal; Notable for the following components:   WBC 13.5 (*)    Neutro Abs 11.3 (*)    Monocytes Absolute 1.1 (*)    All other components within normal limits  INFLUENZA PANEL BY PCR (TYPE A & B)  URINALYSIS, ROUTINE W REFLEX MICROSCOPIC     Radiology Dg Chest 2 View  Result Date: 12/07/2017 CLINICAL DATA:  Generalized pain. Cough and chills. Headache. Symptoms began yesterday. EXAM: CHEST  2 VIEW COMPARISON:  None. FINDINGS: Heart size is normal. Mediastinal shadows are normal except for some aortic calcification. The lungs are clear. No bronchial thickening. No infiltrate, mass, effusion or collapse. Pulmonary vascularity is normal. No bony abnormality. IMPRESSION: No active cardiopulmonary disease.  Aortic atherosclerosis. Electronically Signed    By: Paulina Fusi M.D.   On: 12/07/2017 12:43    Procedures Procedures (including critical care time)  Medications Ordered in ED Medications  azithromycin (ZITHROMAX) 500 mg in dextrose 5 % 250 mL IVPB (not administered)  methylPREDNISolone sodium succinate (SOLU-MEDROL) 125 mg/2 mL injection 125 mg (not administered)  sodium chloride 0.9 % bolus 1,000 mL (0 mLs Intravenous Stopped 12/07/17 1418)  ketorolac (TORADOL) 30 MG/ML injection 15 mg (15 mg Intravenous Given 12/07/17 1250)  sodium chloride 0.9 % bolus 1,000 mL (1,000 mLs Intravenous New Bag/Given 12/07/17 1415)     Initial Impression / Assessment and Plan / ED Course  I have reviewed the triage vital signs and the nursing notes.  Pertinent labs & imaging results that were available during my care of the patient were reviewed by me and considered in my medical decision making (see chart for details).     3:45 PM Patient looks substantially better. She states that she feels better as well. Initial labs notable for mild leukocytosis, negative influenza test. Though the x-ray is interpreted as normal, given her mild fever, smoking history, and description of symptoms, patient will receive empiric therapy for pneumonia. Update:, Patient has tolerated initial therapy well, given her improvement, patient was discharged in stable condition with outpatient follow-up. Though the patient complained of pain all over, including neck soreness, she has no nuchal rigidity, no hesitation of her neck no evidence for meningismus, confusion, disorientation suggesting meningitis. No evidence for bacteremia or sepsis given her improvement here, absence of hypotension, substantial fever.   Final Clinical Impressions(s) / ED Diagnoses  Influenza-like illness   Gerhard Munch, MD 12/07/17 1546

## 2017-12-07 NOTE — Discharge Instructions (Signed)
As discussed, you are receiving antibiotics for your ongoing illness. Please take the single pill each of the next 4 days, as well as the steroids.  Sure to follow-up with your primary care physician or return here for concerning changes.

## 2017-12-12 ENCOUNTER — Encounter: Payer: Self-pay | Admitting: Internal Medicine

## 2017-12-12 ENCOUNTER — Other Ambulatory Visit (INDEPENDENT_AMBULATORY_CARE_PROVIDER_SITE_OTHER): Payer: BLUE CROSS/BLUE SHIELD

## 2017-12-12 ENCOUNTER — Ambulatory Visit: Payer: BLUE CROSS/BLUE SHIELD | Admitting: Internal Medicine

## 2017-12-12 VITALS — BP 148/90 | HR 80 | Temp 98.0°F | Ht 64.5 in | Wt 132.0 lb

## 2017-12-12 DIAGNOSIS — N390 Urinary tract infection, site not specified: Secondary | ICD-10-CM

## 2017-12-12 DIAGNOSIS — Z0001 Encounter for general adult medical examination with abnormal findings: Secondary | ICD-10-CM | POA: Diagnosis not present

## 2017-12-12 DIAGNOSIS — R739 Hyperglycemia, unspecified: Secondary | ICD-10-CM

## 2017-12-12 DIAGNOSIS — E785 Hyperlipidemia, unspecified: Secondary | ICD-10-CM

## 2017-12-12 DIAGNOSIS — J438 Other emphysema: Secondary | ICD-10-CM | POA: Diagnosis not present

## 2017-12-12 DIAGNOSIS — E871 Hypo-osmolality and hyponatremia: Secondary | ICD-10-CM | POA: Diagnosis not present

## 2017-12-12 LAB — URINALYSIS, ROUTINE W REFLEX MICROSCOPIC
Bilirubin Urine: NEGATIVE
Ketones, ur: NEGATIVE
Nitrite: NEGATIVE
Specific Gravity, Urine: 1.025 (ref 1.000–1.030)
Total Protein, Urine: 30 — AB
Urine Glucose: NEGATIVE
Urobilinogen, UA: 0.2 (ref 0.0–1.0)
pH: 6 (ref 5.0–8.0)

## 2017-12-12 LAB — CBC WITH DIFFERENTIAL/PLATELET
BASOS ABS: 0 10*3/uL (ref 0.0–0.1)
Basophils Relative: 0.2 % (ref 0.0–3.0)
EOS ABS: 0.1 10*3/uL (ref 0.0–0.7)
Eosinophils Relative: 0.7 % (ref 0.0–5.0)
HEMATOCRIT: 38.5 % (ref 36.0–46.0)
Hemoglobin: 12.7 g/dL (ref 12.0–15.0)
LYMPHS PCT: 34.8 % (ref 12.0–46.0)
Lymphs Abs: 4 10*3/uL (ref 0.7–4.0)
MCHC: 33 g/dL (ref 30.0–36.0)
MCV: 83.9 fl (ref 78.0–100.0)
MONO ABS: 0.7 10*3/uL (ref 0.1–1.0)
Monocytes Relative: 6.3 % (ref 3.0–12.0)
NEUTROS ABS: 6.6 10*3/uL (ref 1.4–7.7)
Neutrophils Relative %: 58 % (ref 43.0–77.0)
PLATELETS: 450 10*3/uL — AB (ref 150.0–400.0)
RBC: 4.59 Mil/uL (ref 3.87–5.11)
RDW: 12.8 % (ref 11.5–15.5)
WBC: 11.5 10*3/uL — AB (ref 4.0–10.5)

## 2017-12-12 LAB — LIPID PANEL
CHOL/HDL RATIO: 2
CHOLESTEROL: 169 mg/dL (ref 0–200)
HDL: 68.7 mg/dL (ref >39.00)
LDL CALC: 81 mg/dL (ref 0–99)
NONHDL: 100.38
Triglycerides: 98 mg/dL (ref 0.0–149.0)
VLDL: 19.6 mg/dL (ref 0.0–40.0)

## 2017-12-12 LAB — HEPATIC FUNCTION PANEL
ALK PHOS: 83 U/L (ref 39–117)
ALT: 10 U/L (ref 0–35)
AST: 10 U/L (ref 0–37)
Albumin: 3.8 g/dL (ref 3.5–5.2)
BILIRUBIN DIRECT: 0 mg/dL (ref 0.0–0.3)
BILIRUBIN TOTAL: 0.3 mg/dL (ref 0.2–1.2)
Total Protein: 6.9 g/dL (ref 6.0–8.3)

## 2017-12-12 LAB — BASIC METABOLIC PANEL
BUN: 19 mg/dL (ref 6–23)
CHLORIDE: 102 meq/L (ref 96–112)
CO2: 30 meq/L (ref 19–32)
CREATININE: 0.75 mg/dL (ref 0.40–1.20)
Calcium: 9.2 mg/dL (ref 8.4–10.5)
GFR: 82.05 mL/min (ref >60.00)
GLUCOSE: 97 mg/dL (ref 70–99)
Potassium: 4 mEq/L (ref 3.5–5.1)
Sodium: 138 mEq/L (ref 135–145)

## 2017-12-12 LAB — HEMOGLOBIN A1C: HEMOGLOBIN A1C: 6 % (ref 4.6–6.5)

## 2017-12-12 LAB — TSH: TSH: 2.35 u[IU]/mL (ref 0.35–4.50)

## 2017-12-12 MED ORDER — CEFTRIAXONE SODIUM 1 G IJ SOLR
1.0000 g | Freq: Once | INTRAMUSCULAR | Status: AC
  Administered 2017-12-12: 1 g via INTRAMUSCULAR

## 2017-12-12 MED ORDER — CIPROFLOXACIN HCL 500 MG PO TABS: 500.0000 mg | ORAL_TABLET | Freq: Two times a day (BID) | ORAL | 0 refills | Status: AC

## 2017-12-12 NOTE — Assessment & Plan Note (Signed)
stable overall by history and exam, and pt to continue medical treatment as before,  to f/u any worsening symptoms or concerns 

## 2017-12-12 NOTE — Assessment & Plan Note (Signed)
Lab Results  Component Value Date   HGBA1C 6.0 12/12/2017  stable overall by history and exam, recent data reviewed with pt, and pt to continue medical treatment as before,  to f/u any worsening symptoms or concerns

## 2017-12-12 NOTE — Assessment & Plan Note (Signed)
Mild to mod, likely hypotonic hypovolemic related, for f/u lab today, cont oral fluids

## 2017-12-12 NOTE — Patient Instructions (Addendum)
Ok to stop the zithromax  You had the antibiotic shot today  Please take all new medication as prescribed - the pill antibiotic  Please continue all other medications as before, and refills have been done if requested.  Please have the pharmacy call with any other refills you may need.  Please continue your efforts at being more active, low cholesterol diet, and weight control.  You are otherwise up to date with prevention measures today.  Please keep your appointments with your specialists as you may have planned  Please go to the LAB in the Basement (turn left off the elevator) for the tests to be done today  You will be contacted by phone if any changes need to be made immediately.  Otherwise, you will receive a letter about your results with an explanation, but please check with MyChart first.  Please remember to sign up for MyChart if you have not done so, as this will be important to you in the future with finding out test results, communicating by private email, and scheduling acute appointments online when needed.  OK to cancel feb 18 appt  Please return in 6 months, or sooner if needed

## 2017-12-12 NOTE — Progress Notes (Signed)
Subjective:    Patient ID: Kathy Vargas, female    DOB: 1951/07/21, 67 y.o.   MRN: [id number]  HPI  Here for wellness and f/u;  Overall doing ok;  Pt denies Chest pain, worsening SOB, DOE, wheezing, orthopnea, PND, worsening LE edema, palpitations, dizziness or syncope.  Pt denies neurological change such as new headache, facial or extremity weakness.  Pt denies polydipsia, polyuria, or low sugar symptoms. Pt states overall good compliance with treatment and medications, good tolerability, and has been trying to follow appropriate diet.  Pt denies worsening depressive symptoms, suicidal ideation or panic. No fever, night sweats, wt loss, loss of appetite, or other constitutional symptoms.  Pt states good ability with ADL's, has low fall risk, home safety reviewed and adequate, no other significant changes in hearing or vision, and only occasionally active with exercise.  Plans to call for own mammogram, Declines DXA , due for lipid f/u  Also here feeling quite poorly, was seen at ED jan 17 with febrile illness, and cough, found to be + hyponatremic, cxr neg, flu PCR neg;  UA with +++ WBC/other abnormal elements, but noted as Negative on ED documentation; was tx for ? pna per pt with zpack for home after initial IV dose in ED;  Last urine cx sept 2018 with parital resistant E coli, last UTI nov 2018 but no culture results, tx with levaquin po successfully BP Readings from Last 3 Encounters:  12/12/17 (!) 148/90  12/07/17 131/78  08/15/17 122/78   Past Medical History:  Diagnosis Date  . ABDOMINAL TENDERNESS 01/21/2011  . ACUTE GINGIVITIS NONPLAQUE INDUCED 06/11/2010  . ADD 09/05/2008  . Alcohol abuse, in remission 09/27/2007  . ALLERGIC RHINITIS 03/02/2009  . ANXIETY 07/02/2007  . BACK PAIN 05/19/2009  . CHRONIC OBSTRUCTIVE PULMONARY DISEASE, ACUTE EXACERBATION 08/06/2009  . COPD 07/02/2007  . DEGENERATIVE JOINT DISEASE, CERVICAL SPINE 07/02/2007  . DEPRESSION 07/02/2007  . GASTROENTERITIS, VIRAL  11/29/2010  . GERD 01/21/2011  . Headache(784.0) 07/02/2007  . HYPOTHYROIDISM 07/02/2007  . Lumbar disc disease   . Lumbar pain with radiation down right leg 06/01/2011  . OSTEOARTHRITIS 07/02/2007  . Other and unspecified ovarian cyst 05/19/2009  . PAIN, CHRONIC, DUE TO TRAUMA 07/02/2007  . PELVIC  PAIN 03/29/2010  . UTI 05/19/2009  . Vitamin D insufficiency    Past Surgical History:  Procedure Laterality Date  . ABDOMINAL HYSTERECTOMY    . CHOLECYSTECTOMY      reports that she has been smoking.  she has never used smokeless tobacco. She reports that she does not drink alcohol or use drugs. family history includes Alcohol abuse in her other; Anxiety disorder in her other; Coronary artery disease in her other; Depression in her other; Stroke in her other. Allergies  Allergen Reactions  . Morphine Nausea And Vomiting  . Hydrochlorothiazide Other (See Comments)    "sees spots"   Current Outpatient Medications on File Prior to Visit  Medication Sig Dispense Refill  . albuterol (PROVENTIL HFA;VENTOLIN HFA) 108 (90 Base) MCG/ACT inhaler Inhale 2 puffs into the lungs every 6 (six) hours as needed for wheezing or shortness of breath. 1 Inhaler 11  . ALPRAZolam (XANAX) 0.5 MG tablet Take 1 tablet (0.5 mg total) by mouth at bedtime as needed. (Patient taking differently: Take 0.5 mg by mouth at bedtime as needed for anxiety. ) 30 tablet 5  . amphetamine-dextroamphetamine (ADDERALL) 30 MG tablet TAKE 1 TABLET BY MOUTH 2 TIMES DAILY. (Patient taking differently: Take 30 mg by mouth 2 (  two) times daily. ) 60 tablet 0  . diclofenac sodium (VOLTAREN) 1 % GEL Apply 4 g topically 4 (four) times daily as needed. (Patient taking differently: Apply 4 g topically 4 (four) times daily as needed (pain). ) 400 g 11  . estradiol (ESTRACE) 1 MG tablet TAKE ONE (1) TABLET BY MOUTH EVERY DAY 90 tablet 3  . hydrochlorothiazide (MICROZIDE) 12.5 MG capsule Take 1 capsule (12.5 mg total) by mouth daily. As needed 30 capsule 11   . ibuprofen (ADVIL,MOTRIN) 200 MG tablet Take 200 mg by mouth every 8 (eight) hours as needed for moderate pain.     . predniSONE (DELTASONE) 20 MG tablet Take 2 tablets (40 mg total) by mouth daily with breakfast. For the next four days 8 tablet 0  . rosuvastatin (CRESTOR) 10 MG tablet Take 1 tablet (10 mg total) by mouth daily. 90 tablet 3  . [DISCONTINUED] FLUoxetine (PROZAC) 20 MG capsule Take 1 capsule (20 mg total) by mouth daily. 30 capsule 11   No current facility-administered medications on file prior to visit.    Review of Systems Constitutional: Negative for other unusual diaphoresis, sweats, appetite or weight changes HENT: Negative for other worsening hearing loss, ear pain, facial swelling, mouth sores or neck stiffness.   Eyes: Negative for other worsening pain, redness or other visual disturbance.  Respiratory: Negative for other stridor or swelling Cardiovascular: Negative for other palpitations or other chest pain  Gastrointestinal: Negative for worsening diarrhea or loose stools, blood in stool, distention or other pain Genitourinary: Negative for hematuria, flank pain or other change in urine volume.  Musculoskeletal: Negative for myalgias or other joint swelling.  Skin: Negative for other color change, or other wound or worsening drainage.  Neurological: Negative for other syncope or numbness. Hematological: Negative for other adenopathy or swelling Psychiatric/Behavioral: Negative for hallucinations, other worsening agitation, SI, self-injury, or new decreased concentration All other system neg per pt    Objective:   Physical Exam BP (!) 148/90   Pulse 80   Temp 98 F (36.7 C) (Oral)   Ht 5' 4.5" (1.638 m)   Wt 132 lb (59.9 kg)   SpO2 100%   BMI 22.31 kg/m  VS noted, mild to mod ill appearing Constitutional: Pt is oriented to person, place, and time. Appears well-developed and well-nourished, in no significant distress and comfortable Head: Normocephalic and  atraumatic  Eyes: Conjunctivae and EOM are normal. Pupils are equal, round, and reactive to light Right Ear: External ear normal without discharge Left Ear: External ear normal without discharge Nose: Nose without discharge or deformity Mouth/Throat: Oropharynx is without other ulcerations and moist  Neck: Normal range of motion. Neck supple. No JVD present. No tracheal deviation present or significant neck LA or mass Cardiovascular: Normal rate, regular rhythm, normal heart sounds and intact distal pulses.   Pulmonary/Chest: WOB normal and breath sounds without rales or wheezing  Abdominal: Soft. Bowel sounds are normal. NT. No HSM , no flank tender Musculoskeletal: Normal range of motion. Exhibits no edema Lymphadenopathy: Has no other cervical adenopathy.  Neurological: Pt is alert and oriented to person, place, and time. Pt has normal reflexes. No cranial nerve deficit. Motor grossly intact, Gait intact Skin: Skin is warm and dry. No rash noted or new ulcerations Psychiatric:  Has normal mood and affect. Behavior is normal without agitation No other exam findings       Assessment & Plan:

## 2017-12-12 NOTE — Assessment & Plan Note (Signed)

## 2017-12-12 NOTE — Assessment & Plan Note (Addendum)
asympt, but high suspicion for cause of recent illness as being asymptomatic for specific GU symptoms is common in older females, has recent hx of UTI x 2 in last 3 mo, and no other significant explanation; doubt viral illness I think supported by leukocytosis as well, ED labs with recent UA markedly abnormal, will need repeat UA and urine cx, rocephin IM 1 gm, also empiric cipro asd pending cx results  In addition to the time spent performing CPE, I spent an additional 25 minutes face to face,in which greater than 50% of this time was spent in counseling and coordination of care for patient's acute illness as documented, including the differential dx, treatment, further evaluation and other management of recurring UTI, copd, hyponatremia, HLD, hyperglycemia

## 2017-12-12 NOTE — Assessment & Plan Note (Signed)
stable overall by history and exam, recent data reviewed with pt, and pt to continue medical treatment as before,  to f/u any worsening symptoms or concerns, for f/u lipids 

## 2017-12-14 LAB — URINE CULTURE
MICRO NUMBER: 90090425
SPECIMEN QUALITY:: ADEQUATE

## 2017-12-25 ENCOUNTER — Other Ambulatory Visit: Payer: Self-pay | Admitting: Internal Medicine

## 2017-12-25 NOTE — Telephone Encounter (Signed)
Done erx 

## 2018-01-08 ENCOUNTER — Ambulatory Visit: Payer: BLUE CROSS/BLUE SHIELD | Admitting: Internal Medicine

## 2018-01-23 ENCOUNTER — Encounter: Payer: Self-pay | Admitting: Internal Medicine

## 2018-01-23 ENCOUNTER — Ambulatory Visit: Payer: BLUE CROSS/BLUE SHIELD | Admitting: Internal Medicine

## 2018-01-23 ENCOUNTER — Other Ambulatory Visit: Payer: Self-pay | Admitting: Internal Medicine

## 2018-01-23 VITALS — BP 116/76 | HR 83 | Temp 98.0°F | Ht 64.5 in | Wt 134.0 lb

## 2018-01-23 DIAGNOSIS — F17200 Nicotine dependence, unspecified, uncomplicated: Secondary | ICD-10-CM | POA: Diagnosis not present

## 2018-01-23 DIAGNOSIS — J441 Chronic obstructive pulmonary disease with (acute) exacerbation: Secondary | ICD-10-CM | POA: Diagnosis not present

## 2018-01-23 DIAGNOSIS — N390 Urinary tract infection, site not specified: Secondary | ICD-10-CM

## 2018-01-23 DIAGNOSIS — E785 Hyperlipidemia, unspecified: Secondary | ICD-10-CM

## 2018-01-23 DIAGNOSIS — R739 Hyperglycemia, unspecified: Secondary | ICD-10-CM

## 2018-01-23 MED ORDER — PREDNISONE 10 MG PO TABS: ORAL_TABLET | ORAL | 0 refills | Status: DC

## 2018-01-23 MED ORDER — VARENICLINE TARTRATE 1 MG PO TABS: 1.0000 mg | ORAL_TABLET | Freq: Two times a day (BID) | ORAL | 1 refills | Status: DC

## 2018-01-23 MED ORDER — METHYLPREDNISOLONE ACETATE 80 MG/ML IJ SUSP
80.0000 mg | Freq: Once | INTRAMUSCULAR | Status: AC
  Administered 2018-01-23: 80 mg via INTRAMUSCULAR

## 2018-01-23 MED ORDER — VARENICLINE TARTRATE 0.5 MG X 11 & 1 MG X 42 PO MISC: ORAL | 0 refills | Status: DC

## 2018-01-23 MED ORDER — ALBUTEROL SULFATE HFA 108 (90 BASE) MCG/ACT IN AERS: 2.0000 | INHALATION_SPRAY | Freq: Four times a day (QID) | RESPIRATORY_TRACT | 11 refills | Status: DC | PRN

## 2018-01-23 NOTE — Patient Instructions (Signed)
You had the steroid shot today  Please take all new medication as prescribed - the prednisone  Please stop smoking  You are given the written prescriptions for the Chantix to "shop around"  Please continue all other medications as before, and refills have been done if requested. - the inhaler and adderall  Please have the pharmacy call with any other refills you may need.  Please continue your efforts at being more active, low cholesterol diet, and weight control.  Please keep your appointments with your specialists as you may have planned

## 2018-01-23 NOTE — Progress Notes (Signed)
Subjective:    Patient ID: Kathy Vargas, female    DOB: [birth year], 67 y.o.   MRN: [id number]  HPI  Here with acute onset mild to mod 2-3 wks ST, HA, general weakness and malaise, with prod cough greenish sputum, but Pt denies chest pain, orthopnea, PND, increased LE swelling, palpitations, dizziness or syncope, but has also wheezing mild sob in last 3 days and cough leads to stress incontinence accidents. Pt denies polydipsia, polyuria,  UTI resolved.  ADD meds working well.  Willing to try to quit smoking - asks for chantix  No other interval change or hx Past Medical History:  Diagnosis Date  . ABDOMINAL TENDERNESS 01/21/2011  . ACUTE GINGIVITIS NONPLAQUE INDUCED 06/11/2010  . ADD 09/05/2008  . Alcohol abuse, in remission 09/27/2007  . ALLERGIC RHINITIS 03/02/2009  . ANXIETY 07/02/2007  . BACK PAIN 05/19/2009  . CHRONIC OBSTRUCTIVE PULMONARY DISEASE, ACUTE EXACERBATION 08/06/2009  . COPD 07/02/2007  . DEGENERATIVE JOINT DISEASE, CERVICAL SPINE 07/02/2007  . DEPRESSION 07/02/2007  . GASTROENTERITIS, VIRAL 11/29/2010  . GERD 01/21/2011  . Headache(784.0) 07/02/2007  . HYPOTHYROIDISM 07/02/2007  . Lumbar disc disease   . Lumbar pain with radiation down right leg 06/01/2011  . OSTEOARTHRITIS 07/02/2007  . Other and unspecified ovarian cyst 05/19/2009  . PAIN, CHRONIC, DUE TO TRAUMA 07/02/2007  . PELVIC  PAIN 03/29/2010  . UTI 05/19/2009  . Vitamin D insufficiency    Past Surgical History:  Procedure Laterality Date  . ABDOMINAL HYSTERECTOMY    . CHOLECYSTECTOMY      reports that she has been smoking.  she has never used smokeless tobacco. She reports that she does not drink alcohol or use drugs. family history includes Alcohol abuse in her other; Anxiety disorder in her other; Coronary artery disease in her other; Depression in her other; Stroke in her other. Allergies  Allergen Reactions  . Morphine Nausea And Vomiting  . Hydrochlorothiazide Other (See Comments)    "sees spots"   Current  Outpatient Medications on File Prior to Visit  Medication Sig Dispense Refill  . ALPRAZolam (XANAX) 0.5 MG tablet TAKE ONE TABLET BY MOUTH EVERY NIGHT AT BEDTIME AS NEEDED 30 tablet 5  . diclofenac sodium (VOLTAREN) 1 % GEL Apply 4 g topically 4 (four) times daily as needed. (Patient taking differently: Apply 4 g topically 4 (four) times daily as needed (pain). ) 400 g 11  . estradiol (ESTRACE) 1 MG tablet TAKE ONE (1) TABLET BY MOUTH EVERY DAY 90 tablet 3  . hydrochlorothiazide (MICROZIDE) 12.5 MG capsule Take 1 capsule (12.5 mg total) by mouth daily. As needed 30 capsule 11  . ibuprofen (ADVIL,MOTRIN) 200 MG tablet Take 200 mg by mouth every 8 (eight) hours as needed for moderate pain.     . rosuvastatin (CRESTOR) 10 MG tablet Take 1 tablet (10 mg total) by mouth daily. 90 tablet 3  . [DISCONTINUED] FLUoxetine (PROZAC) 20 MG capsule Take 1 capsule (20 mg total) by mouth daily. 30 capsule 11   No current facility-administered medications on file prior to visit.    Review of Systems  Constitutional: Negative for other unusual diaphoresis or sweats HENT: Negative for ear discharge or swelling Eyes: Negative for other worsening visual disturbances Respiratory: Negative for stridor or other swelling  Gastrointestinal: Negative for worsening distension or other blood Genitourinary: Negative for retention or other urinary change Musculoskeletal: Negative for other MSK pain or swelling Skin: Negative for color change or other new lesions Neurological: Negative for worsening tremors and other  numbness  Psychiatric/Behavioral: Negative for worsening agitation or other fatigue All other system neg per pt    Objective:   Physical Exam BP 116/76   Pulse 83   Temp 98 F (36.7 C) (Oral)   Ht 5' 4.5" (1.638 m)   Wt 134 lb (60.8 kg)   SpO2 99%   BMI 22.65 kg/m  VS noted, mild ill Constitutional: Pt appears in NAD HENT: Head: NCAT.  Right Ear: External ear normal.  Left Ear: External ear  normal.  Eyes: . Pupils are equal, round, and reactive to light. Conjunctivae and EOM are normal Bilat tm's with mild erythema.  Max sinus areas non tender.  Pharynx with mild erythema, no exudate Nose: without d/c or deformity Neck: Neck supple. Gross normal ROM Cardiovascular: Normal rate and regular rhythm.   Pulmonary/Chest: Effort normal and breath sounds without rales but with mild bilat wheezing.  Neurological: Pt is alert. At baseline orientation, motor grossly intact Skin: Skin is warm. No rashes, other new lesions, no LE edema Psychiatric: Pt behavior is normal without agitation , mild nervous No other exam findings      Assessment & Plan:

## 2018-01-24 NOTE — Assessment & Plan Note (Signed)
For chantix asd, to f/u any worsening symptoms or concerns 

## 2018-01-24 NOTE — Assessment & Plan Note (Signed)
Lab Results  Component Value Date   LDLCALC 81 12/12/2017  stable overall by history and exam, recent data reviewed with pt, and pt to continue medical treatment as before,  to f/u any worsening symptoms or concerns

## 2018-01-24 NOTE — Assessment & Plan Note (Signed)
Mild to mod, for antibx course, cough med prn, to f/u any worsening symptoms or concerns 

## 2018-01-24 NOTE — Assessment & Plan Note (Signed)
Lab Results  Component Value Date   HGBA1C 6.0 12/12/2017  stable overall by history and exam, recent data reviewed with pt, and pt to continue medical treatment as before,  to f/u any worsening symptoms or concerns

## 2018-01-24 NOTE — Assessment & Plan Note (Signed)
Mild to mod, for depomedrol IM 80, predpac asd, to f/u any worsening symptoms or concerns 

## 2018-01-24 NOTE — Assessment & Plan Note (Signed)
Resolved, cont to follow °

## 2018-02-22 ENCOUNTER — Other Ambulatory Visit: Payer: Self-pay | Admitting: Internal Medicine

## 2018-02-23 NOTE — Telephone Encounter (Signed)
02/22/2018 60# 

## 2018-02-23 NOTE — Telephone Encounter (Signed)
Done erx 

## 2018-03-23 ENCOUNTER — Other Ambulatory Visit: Payer: Self-pay | Admitting: Internal Medicine

## 2018-03-23 NOTE — Telephone Encounter (Signed)
Done erx 

## 2018-03-23 NOTE — Telephone Encounter (Signed)
02/22/2018 60# 

## 2018-04-03 ENCOUNTER — Ambulatory Visit: Payer: BLUE CROSS/BLUE SHIELD | Admitting: Internal Medicine

## 2018-04-03 ENCOUNTER — Encounter: Payer: Self-pay | Admitting: Internal Medicine

## 2018-04-03 VITALS — BP 118/76 | HR 83 | Temp 98.3°F | Ht 64.5 in | Wt 132.0 lb

## 2018-04-03 DIAGNOSIS — R05 Cough: Secondary | ICD-10-CM

## 2018-04-03 DIAGNOSIS — J441 Chronic obstructive pulmonary disease with (acute) exacerbation: Secondary | ICD-10-CM | POA: Diagnosis not present

## 2018-04-03 DIAGNOSIS — R739 Hyperglycemia, unspecified: Secondary | ICD-10-CM

## 2018-04-03 DIAGNOSIS — R059 Cough, unspecified: Secondary | ICD-10-CM

## 2018-04-03 MED ORDER — HYDROCODONE-HOMATROPINE 5-1.5 MG/5ML PO SYRP: 5.0000 mL | ORAL_SOLUTION | Freq: Four times a day (QID) | ORAL | 0 refills | Status: AC | PRN

## 2018-04-03 MED ORDER — PREDNISONE 10 MG PO TABS: ORAL_TABLET | ORAL | 0 refills | Status: DC

## 2018-04-03 MED ORDER — ALBUTEROL SULFATE HFA 108 (90 BASE) MCG/ACT IN AERS: 2.0000 | INHALATION_SPRAY | Freq: Four times a day (QID) | RESPIRATORY_TRACT | 11 refills | Status: DC | PRN

## 2018-04-03 MED ORDER — BUDESONIDE-FORMOTEROL FUMARATE 80-4.5 MCG/ACT IN AERO: 2.0000 | INHALATION_SPRAY | Freq: Two times a day (BID) | RESPIRATORY_TRACT | 11 refills | Status: DC

## 2018-04-03 MED ORDER — METHYLPREDNISOLONE ACETATE 80 MG/ML IJ SUSP
80.0000 mg | Freq: Once | INTRAMUSCULAR | Status: AC
  Administered 2018-04-03: 80 mg via INTRAMUSCULAR

## 2018-04-03 NOTE — Patient Instructions (Addendum)
You had the steroid shot today  Please take all new medication as prescribed - the cough medicine as needed, prednisone , and Symbicort  Please have the pharmacy call us with an alternative if you are unable to afford the symbicort,  Please continue all other medications as before, and refills have been done if requested - the albuterol inhaler as needed  Please have the pharmacy call with any other refills you may need.  Please keep your appointments with your specialists as you may have planned

## 2018-04-03 NOTE — Assessment & Plan Note (Signed)
Likely due to copd exacerbation, for tx as above, declines cxr

## 2018-04-03 NOTE — Progress Notes (Signed)
Subjective:    Patient ID: Kathy Vargas, female    DOB: 20-Mar-1951, 67 y.o.   MRN: [id number]  HPI  Here to f/u, c/o persistent non prod cough, wheezing and sob but Pt denies chest pain, orthopnea, PND, increased LE swelling, palpitations, dizziness or syncope.   Pt denies fever, wt loss, night sweats, loss of appetite, or other constitutional symptoms  Pt denies new neurological symptoms such as new headache, or facial or extremity weakness or numbness   Pt denies polydipsia, polyuria . Past Medical History:  Diagnosis Date  . ABDOMINAL TENDERNESS 01/21/2011  . ACUTE GINGIVITIS NONPLAQUE INDUCED 06/11/2010  . ADD 09/05/2008  . Alcohol abuse, in remission 09/27/2007  . ALLERGIC RHINITIS 03/02/2009  . ANXIETY 07/02/2007  . BACK PAIN 05/19/2009  . CHRONIC OBSTRUCTIVE PULMONARY DISEASE, ACUTE EXACERBATION 08/06/2009  . COPD 07/02/2007  . DEGENERATIVE JOINT DISEASE, CERVICAL SPINE 07/02/2007  . DEPRESSION 07/02/2007  . GASTROENTERITIS, VIRAL 11/29/2010  . GERD 01/21/2011  . Headache(784.0) 07/02/2007  . HYPOTHYROIDISM 07/02/2007  . Lumbar disc disease   . Lumbar pain with radiation down right leg 06/01/2011  . OSTEOARTHRITIS 07/02/2007  . Other and unspecified ovarian cyst 05/19/2009  . PAIN, CHRONIC, DUE TO TRAUMA 07/02/2007  . PELVIC  PAIN 03/29/2010  . UTI 05/19/2009  . Vitamin D insufficiency    Past Surgical History:  Procedure Laterality Date  . ABDOMINAL HYSTERECTOMY    . CHOLECYSTECTOMY      reports that she has been smoking.  She has never used smokeless tobacco. She reports that she does not drink alcohol or use drugs. family history includes Alcohol abuse in her other; Anxiety disorder in her other; Coronary artery disease in her other; Depression in her other; Stroke in her other. Allergies  Allergen Reactions  . Morphine Nausea And Vomiting  . Hydrochlorothiazide Other (See Comments)    "sees spots"   Current Outpatient Medications on File Prior to Visit  Medication Sig Dispense  Refill  . ALPRAZolam (XANAX) 0.5 MG tablet TAKE ONE TABLET BY MOUTH EVERY NIGHT AT BEDTIME AS NEEDED 30 tablet 5  . amphetamine-dextroamphetamine (ADDERALL) 30 MG tablet TAKE ONE TABLET BY MOUTH TWO TIMES DAILY 60 tablet 0  . amphetamine-dextroamphetamine (ADDERALL) 30 MG tablet TAKE ONE (1) TABLET BY MOUTH TWO (2) TIMES DAILY 60 tablet 0  . diclofenac sodium (VOLTAREN) 1 % GEL Apply 4 g topically 4 (four) times daily as needed. (Patient taking differently: Apply 4 g topically 4 (four) times daily as needed (pain). ) 400 g 11  . estradiol (ESTRACE) 1 MG tablet TAKE ONE (1) TABLET BY MOUTH EVERY DAY 90 tablet 3  . hydrochlorothiazide (MICROZIDE) 12.5 MG capsule Take 1 capsule (12.5 mg total) by mouth daily. As needed 30 capsule 11  . ibuprofen (ADVIL,MOTRIN) 200 MG tablet Take 200 mg by mouth every 8 (eight) hours as needed for moderate pain.     . rosuvastatin (CRESTOR) 10 MG tablet Take 1 tablet (10 mg total) by mouth daily. 90 tablet 3  . varenicline (CHANTIX CONTINUING MONTH PAK) 1 MG tablet Take 1 tablet (1 mg total) by mouth 2 (two) times daily. 60 tablet 1  . varenicline (CHANTIX STARTING MONTH PAK) 0.5 MG X 11 & 1 MG X 42 tablet Take one 0.5 mg tablet by mouth once daily for 3 days, then increase to one 0.5 mg tablet twice daily for 4 days, then increase to one 1 mg tablet twice daily. 53 tablet 0  . [DISCONTINUED] FLUoxetine (PROZAC) 20 MG capsule  Take 1 capsule (20 mg total) by mouth daily. 30 capsule 11   No current facility-administered medications on file prior to visit.    Review of Systems  Constitutional: Negative for other unusual diaphoresis or sweats HENT: Negative for ear discharge or swelling Eyes: Negative for other worsening visual disturbances Respiratory: Negative for stridor or other swelling  Gastrointestinal: Negative for worsening distension or other blood Genitourinary: Negative for retention or other urinary change Musculoskeletal: Negative for other MSK pain or  swelling Skin: Negative for color change or other new lesions Neurological: Negative for worsening tremors and other numbness  Psychiatric/Behavioral: Negative for worsening agitation or other fatigue All other system neg per pt    Objective:   Physical Exam BP 118/76   Pulse 83   Temp 98.3 F (36.8 C) (Oral)   Ht 5' 4.5" (1.638 m)   Wt 132 lb (59.9 kg)   SpO2 96%   BMI 22.31 kg/m  VS noted, not ill appearing Constitutional: Pt appears in NAD HENT: Head: NCAT.  Right Ear: External ear normal.  Left Ear: External ear normal.  Eyes: . Pupils are equal, round, and reactive to light. Conjunctivae and EOM are normal Bilat tm's with mild erythema.  Max sinus areas non tender.  Pharynx with mild erythema, no exudate Nose: without d/c or deformity Neck: Neck supple. Gross normal ROM Cardiovascular: Normal rate and regular rhythm.   Pulmonary/Chest: Effort normal and breath sounds decreased without rales but with scattered bilat wheezing.  Neurological: Pt is alert. At baseline orientation, motor grossly intact Skin: Skin is warm. No rashes, other new lesions, no LE edema Psychiatric: Pt behavior is normal without agitation  No other exam findings    Assessment & Plan:

## 2018-04-03 NOTE — Assessment & Plan Note (Signed)
Mild to mod, no antibiotic indicated, for depomedrol IM 80, predpac asd, cough med prn, add symbicort asd, to f/u any worsening symptoms or concerns

## 2018-04-03 NOTE — Assessment & Plan Note (Signed)
Lab Results  Component Value Date   HGBA1C 6.0 12/12/2017  stable overall by history and exam, recent data reviewed with pt, and pt to continue medical treatment as before,  to f/u any worsening symptoms or concerns

## 2018-06-11 ENCOUNTER — Other Ambulatory Visit: Payer: Self-pay | Admitting: Internal Medicine

## 2018-06-12 ENCOUNTER — Ambulatory Visit: Payer: BLUE CROSS/BLUE SHIELD | Admitting: Internal Medicine

## 2018-06-12 NOTE — Telephone Encounter (Signed)
04/25/2018 60# 

## 2018-06-13 ENCOUNTER — Other Ambulatory Visit: Payer: Self-pay | Admitting: Internal Medicine

## 2018-06-13 NOTE — Telephone Encounter (Signed)
I have been PCP for several years  Adderall done erx

## 2018-06-13 NOTE — Telephone Encounter (Signed)
Done erx 

## 2018-06-13 NOTE — Telephone Encounter (Signed)
Pharmacy is calling and states they received a message stating that this was not a Dr. Jonny Vargas patient and they are confused about that. Please advise.

## 2018-06-21 ENCOUNTER — Encounter: Payer: Self-pay | Admitting: Internal Medicine

## 2018-06-21 ENCOUNTER — Ambulatory Visit: Payer: BLUE CROSS/BLUE SHIELD | Admitting: Internal Medicine

## 2018-06-21 VITALS — BP 122/78 | HR 77 | Temp 98.5°F | Ht 64.5 in | Wt 131.0 lb

## 2018-06-21 DIAGNOSIS — F17200 Nicotine dependence, unspecified, uncomplicated: Secondary | ICD-10-CM

## 2018-06-21 DIAGNOSIS — E785 Hyperlipidemia, unspecified: Secondary | ICD-10-CM

## 2018-06-21 DIAGNOSIS — R739 Hyperglycemia, unspecified: Secondary | ICD-10-CM | POA: Diagnosis not present

## 2018-06-21 DIAGNOSIS — J438 Other emphysema: Secondary | ICD-10-CM | POA: Diagnosis not present

## 2018-06-21 DIAGNOSIS — Z9109 Other allergy status, other than to drugs and biological substances: Secondary | ICD-10-CM | POA: Diagnosis not present

## 2018-06-21 DIAGNOSIS — J309 Allergic rhinitis, unspecified: Secondary | ICD-10-CM

## 2018-06-21 DIAGNOSIS — E890 Postprocedural hypothyroidism: Secondary | ICD-10-CM

## 2018-06-21 MED ORDER — TRIAMCINOLONE ACETONIDE 55 MCG/ACT NA AERO: 2.0000 | INHALATION_SPRAY | Freq: Every day | NASAL | 12 refills | Status: DC

## 2018-06-21 MED ORDER — ALPRAZOLAM 0.5 MG PO TABS: 0.5000 mg | ORAL_TABLET | Freq: Every evening | ORAL | 1 refills | Status: DC | PRN

## 2018-06-21 MED ORDER — CETIRIZINE HCL 10 MG PO TABS: 10.0000 mg | ORAL_TABLET | Freq: Every day | ORAL | 11 refills | Status: DC

## 2018-06-21 MED ORDER — METHYLPREDNISOLONE ACETATE 80 MG/ML IJ SUSP
80.0000 mg | Freq: Once | INTRAMUSCULAR | Status: AC
  Administered 2018-06-21: 80 mg via INTRAMUSCULAR

## 2018-06-21 NOTE — Assessment & Plan Note (Addendum)
Mild to mod, for depomedrol IM 80, zyrtec asd, nasacort asd, mucinex prn,  to f/u any worsening symptoms or concerns  Note:  Total time for pt hx, exam, review of record with pt in the room, determination of diagnoses and plan for further eval and tx is > 40 min, with over 50% spent in coordination and counseling of patient including the differential dx, tx, further evaluation and other management of allergies, copd, hypothyroid, hyperglycemia, HLD, smoker

## 2018-06-21 NOTE — Assessment & Plan Note (Signed)
stable overall by history and exam, recent data reviewed with pt, and pt to continue medical treatment as before,  to f/u any worsening symptoms or concerns Lab Results  Component Value Date   TSH 2.35 12/12/2017

## 2018-06-21 NOTE — Assessment & Plan Note (Signed)
Encouraged complete cessation. 

## 2018-06-21 NOTE — Progress Notes (Signed)
Subjective:    Patient ID: Kathy Vargas, female    DOB: 1951-08-03, 67 y.o.   MRN: [id number]  HPI  Here to f/u, Does have several wks ongoing nasal allergy symptoms with clearish congestion, itch and sneezing, without fever, pain, ST, cough, swelling or wheezing.  Just back from beach with ragweed, only taking the symbicort prn even though getting symbicort free for 1 yr, thought for some reason this was best done prn.  Did not take at beach, has mild increased sob.  Denies worsening depressive symptoms, suicidal ideation, or panic; has ongoing anxiety, not increased recently.  Now actively trying to wean off tobacco.   Pt denies fever, wt loss, night sweats, loss of appetite, or other constitutional symptoms   Pt denies polydipsia, polyuria. Denies hyper or hypo thyroid symptoms such as voice, skin or hair change.   Past Medical History:  Diagnosis Date  . ABDOMINAL TENDERNESS 01/21/2011  . ACUTE GINGIVITIS NONPLAQUE INDUCED 06/11/2010  . ADD 09/05/2008  . Alcohol abuse, in remission 09/27/2007  . ALLERGIC RHINITIS 03/02/2009  . ANXIETY 07/02/2007  . BACK PAIN 05/19/2009  . CHRONIC OBSTRUCTIVE PULMONARY DISEASE, ACUTE EXACERBATION 08/06/2009  . COPD 07/02/2007  . DEGENERATIVE JOINT DISEASE, CERVICAL SPINE 07/02/2007  . DEPRESSION 07/02/2007  . GASTROENTERITIS, VIRAL 11/29/2010  . GERD 01/21/2011  . Headache(784.0) 07/02/2007  . HYPOTHYROIDISM 07/02/2007  . Lumbar disc disease   . Lumbar pain with radiation down right leg 06/01/2011  . OSTEOARTHRITIS 07/02/2007  . Other and unspecified ovarian cyst 05/19/2009  . PAIN, CHRONIC, DUE TO TRAUMA 07/02/2007  . PELVIC  PAIN 03/29/2010  . UTI 05/19/2009  . Vitamin D insufficiency    Past Surgical History:  Procedure Laterality Date  . ABDOMINAL HYSTERECTOMY    . CHOLECYSTECTOMY      reports that she has been smoking.  She has never used smokeless tobacco. She reports that she does not drink alcohol or use drugs. family history includes Alcohol abuse in  her other; Anxiety disorder in her other; Coronary artery disease in her other; Depression in her other; Stroke in her other. Allergies  Allergen Reactions  . Morphine Nausea And Vomiting  . Hydrochlorothiazide Other (See Comments)    "sees spots"   Current Outpatient Medications on File Prior to Visit  Medication Sig Dispense Refill  . albuterol (PROVENTIL HFA;VENTOLIN HFA) 108 (90 Base) MCG/ACT inhaler Inhale 2 puffs into the lungs every 6 (six) hours as needed for wheezing or shortness of breath. 1 Inhaler 11  . amphetamine-dextroamphetamine (ADDERALL) 30 MG tablet TAKE ONE (1) TABLET BY MOUTH TWO (2) TIMES DAILY 60 tablet 0  . budesonide-formoterol (SYMBICORT) 80-4.5 MCG/ACT inhaler Inhale 2 puffs into the lungs 2 (two) times daily. 1 Inhaler 11  . diclofenac sodium (VOLTAREN) 1 % GEL Apply 4 g topically 4 (four) times daily as needed. (Patient taking differently: Apply 4 g topically 4 (four) times daily as needed (pain). ) 400 g 11  . estradiol (ESTRACE) 1 MG tablet TAKE ONE (1) TABLET BY MOUTH EVERY DAY 90 tablet 3  . hydrochlorothiazide (MICROZIDE) 12.5 MG capsule Take 1 capsule (12.5 mg total) by mouth daily. As needed 30 capsule 11  . ibuprofen (ADVIL,MOTRIN) 200 MG tablet Take 200 mg by mouth every 8 (eight) hours as needed for moderate pain.     . predniSONE (DELTASONE) 10 MG tablet 3 tabs by mouth per day for 3 days,2tabs per day for 3 days,1tab per day for 3 days 18 tablet 0  . rosuvastatin (CRESTOR)  10 MG tablet Take 1 tablet (10 mg total) by mouth daily. 90 tablet 3  . varenicline (CHANTIX CONTINUING MONTH PAK) 1 MG tablet Take 1 tablet (1 mg total) by mouth 2 (two) times daily. 60 tablet 1  . varenicline (CHANTIX STARTING MONTH PAK) 0.5 MG X 11 & 1 MG X 42 tablet Take one 0.5 mg tablet by mouth once daily for 3 days, then increase to one 0.5 mg tablet twice daily for 4 days, then increase to one 1 mg tablet twice daily. 53 tablet 0  . [DISCONTINUED] FLUoxetine (PROZAC) 20 MG  capsule Take 1 capsule (20 mg total) by mouth daily. 30 capsule 11   No current facility-administered medications on file prior to visit.    Review of Systems  Constitutional: Negative for other unusual diaphoresis or sweats HENT: Negative for ear discharge or swelling Eyes: Negative for other worsening visual disturbances Respiratory: Negative for stridor or other swelling  Gastrointestinal: Negative for worsening distension or other blood Genitourinary: Negative for retention or other urinary change Musculoskeletal: Negative for other MSK pain or swelling Skin: Negative for color change or other new lesions Neurological: Negative for worsening tremors and other numbness  Psychiatric/Behavioral: Negative for worsening agitation or other fatigue All other system neg per pt    Objective:   Physical Exam BP 122/78   Pulse 77   Temp 98.5 F (36.9 C) (Oral)   Ht 5' 4.5" (1.638 m)   Wt 131 lb (59.4 kg)   SpO2 99%   BMI 22.14 kg/m  VS noted,  Constitutional: Pt appears in NAD HENT: Head: NCAT.  Right Ear: External ear normal.  Left Ear: External ear normal.  Bilat tm's with mild erythema.  Max sinus areas non tender.  Pharynx with mild erythema, no exudate Eyes: . Pupils are equal, round, and reactive to light. Conjunctivae and EOM are normal Nose: without d/c or deformity Neck: Neck supple. Gross normal ROM Cardiovascular: Normal rate and regular rhythm.   Pulmonary/Chest: Effort normal and breath sounds decreased without rales or wheezing.  Neurological: Pt is alert. At baseline orientation, motor grossly intact Skin: Skin is warm. No rashes, other new lesions, no LE edema Psychiatric: Pt behavior is normal without agitation  No other exam findings Lab Results  Component Value Date   WBC 11.5 (H) 12/12/2017   HGB 12.7 12/12/2017   HCT 38.5 12/12/2017   PLT 450.0 (H) 12/12/2017   GLUCOSE 97 12/12/2017   CHOL 169 12/12/2017   TRIG 98.0 12/12/2017   HDL 68.70 12/12/2017     LDLDIRECT 121.4 12/02/2010   LDLCALC 81 12/12/2017   ALT 10 12/12/2017   AST 10 12/12/2017   NA 138 12/12/2017   K 4.0 12/12/2017   CL 102 12/12/2017   CREATININE 0.75 12/12/2017   BUN 19 12/12/2017   CO2 30 12/12/2017   TSH 2.35 12/12/2017   HGBA1C 6.0 12/12/2017       Assessment & Plan:

## 2018-06-21 NOTE — Assessment & Plan Note (Signed)
stable overall by history and exam, recent data reviewed with pt, and pt to continue medical treatment as before,  to f/u any worsening symptoms or concerns Lab Results  Component Value Date   HGBA1C 6.0 12/12/2017

## 2018-06-21 NOTE — Assessment & Plan Note (Signed)
Lab Results  Component Value Date   LDLCALC 81 12/12/2017  stable overall by history and exam, recent data reviewed with pt, and pt to continue medical treatment as before,  to f/u any worsening symptoms or concerns

## 2018-06-21 NOTE — Patient Instructions (Addendum)
You had the steroid shot today  Please take all new medication as prescribed - the zyrtec, nasacort  Please continue all other medications as before, including the symbicort every day  You can also take Mucinex (or it's generic off brand) for congestion, and tylenol as needed for pain.  Please have the pharmacy call with any other refills you may need.  Please continue your efforts at being more active, low cholesterol diet, and weight control.  You are otherwise up to date with prevention measures today.  Please keep your appointments with your specialists as you may have planned  Please keep up with weaning off the smoking!  Please return in 6 months, or sooner if needed, with Lab testing done 3-5 days before

## 2018-06-21 NOTE — Assessment & Plan Note (Signed)
Encouraged compliance with symbicort asd,  to f/u any worsening symptoms or concerns

## 2018-07-12 ENCOUNTER — Other Ambulatory Visit: Payer: Self-pay | Admitting: Internal Medicine

## 2018-07-12 NOTE — Telephone Encounter (Signed)
Last OV 06/21/18   Controlled Substance Database checked. Last filled on 06/13/18

## 2018-07-12 NOTE — Telephone Encounter (Signed)
Done erx 

## 2018-07-13 ENCOUNTER — Other Ambulatory Visit: Payer: Self-pay | Admitting: Internal Medicine

## 2018-08-15 ENCOUNTER — Other Ambulatory Visit: Payer: Self-pay | Admitting: Internal Medicine

## 2018-08-15 NOTE — Telephone Encounter (Signed)
[place] Controlled Database Checked Last filled: 06/13/18  # 60 LOV w/you: 06/21/18 Next appt w/you: 12/25/2018

## 2018-08-15 NOTE — Telephone Encounter (Signed)
Done erx 

## 2018-09-04 ENCOUNTER — Ambulatory Visit (INDEPENDENT_AMBULATORY_CARE_PROVIDER_SITE_OTHER): Payer: BLUE CROSS/BLUE SHIELD

## 2018-09-04 DIAGNOSIS — Z23 Encounter for immunization: Secondary | ICD-10-CM | POA: Diagnosis not present

## 2018-09-25 ENCOUNTER — Other Ambulatory Visit: Payer: Self-pay | Admitting: Internal Medicine

## 2018-09-25 NOTE — Telephone Encounter (Signed)
   LOV:06/21/18 NextOV:12/25/18 Last Filled/Quantity: 08/16/18 60#

## 2018-09-25 NOTE — Telephone Encounter (Signed)
Done erx 

## 2018-11-27 ENCOUNTER — Ambulatory Visit: Payer: BLUE CROSS/BLUE SHIELD | Admitting: Internal Medicine

## 2018-11-27 ENCOUNTER — Encounter: Payer: Self-pay | Admitting: Internal Medicine

## 2018-11-27 VITALS — BP 136/84 | HR 73 | Temp 98.0°F | Ht 64.5 in | Wt 135.0 lb

## 2018-11-27 DIAGNOSIS — Z0001 Encounter for general adult medical examination with abnormal findings: Secondary | ICD-10-CM

## 2018-11-27 DIAGNOSIS — F988 Other specified behavioral and emotional disorders with onset usually occurring in childhood and adolescence: Secondary | ICD-10-CM

## 2018-11-27 DIAGNOSIS — J309 Allergic rhinitis, unspecified: Secondary | ICD-10-CM

## 2018-11-27 DIAGNOSIS — R739 Hyperglycemia, unspecified: Secondary | ICD-10-CM | POA: Diagnosis not present

## 2018-11-27 DIAGNOSIS — F411 Generalized anxiety disorder: Secondary | ICD-10-CM | POA: Diagnosis not present

## 2018-11-27 DIAGNOSIS — J441 Chronic obstructive pulmonary disease with (acute) exacerbation: Secondary | ICD-10-CM

## 2018-11-27 MED ORDER — AMPHETAMINE-DEXTROAMPHETAMINE 30 MG PO TABS: ORAL_TABLET | ORAL | 0 refills | Status: DC

## 2018-11-27 MED ORDER — ALPRAZOLAM 0.5 MG PO TABS: 0.5000 mg | ORAL_TABLET | Freq: Two times a day (BID) | ORAL | 5 refills | Status: DC | PRN

## 2018-11-27 MED ORDER — METHYLPREDNISOLONE ACETATE 80 MG/ML IJ SUSP
80.0000 mg | Freq: Once | INTRAMUSCULAR | Status: AC
  Administered 2018-11-27: 80 mg via INTRAMUSCULAR

## 2018-11-27 MED ORDER — MECLIZINE HCL 12.5 MG PO TABS: 12.5000 mg | ORAL_TABLET | Freq: Three times a day (TID) | ORAL | 1 refills | Status: AC | PRN

## 2018-11-27 MED ORDER — LEVOFLOXACIN 500 MG PO TABS: 500.0000 mg | ORAL_TABLET | Freq: Every day | ORAL | 0 refills | Status: AC

## 2018-11-27 NOTE — Patient Instructions (Signed)
You had the steroid shot today  Please take all new medication as prescribed - the meclizine as needed, and the antibiotic  OK to increase the xanax to twice per day with next refill  Please continue all other medications as before, and refills have been done if requested - the adderall   Please have the pharmacy call with any other refills you may need.  Please continue your efforts at being more active, low cholesterol diet, and weight control.  You are otherwise up to date with prevention measures today.  Please keep your appointments with your specialists as you may have planned  Please go to the LAB in the Basement (turn left off the elevator) for the tests to be done today  You will be contacted by phone if any changes need to be made immediately.  Otherwise, you will receive a letter about your results with an explanation, but please check with MyChart first.  Please remember to sign up for MyChart if you have not done so, as this will be important to you in the future with finding out test results, communicating by private email, and scheduling acute appointments online when needed.  Please return in 6 months, or sooner if needed

## 2018-11-27 NOTE — Assessment & Plan Note (Signed)
Stable, for med refill 

## 2018-11-27 NOTE — Assessment & Plan Note (Signed)
stable overall by history and exam, recent data reviewed with pt, and pt to continue medical treatment as before,  to f/u any worsening symptoms or concerns  

## 2018-11-27 NOTE — Assessment & Plan Note (Signed)

## 2018-11-27 NOTE — Progress Notes (Signed)
Subjective:    Patient ID: Kathy HaberChristine Hartog, female    DOB: [birth year], 68 y.o.   MRN: [id number]  HPI  Here for wellness and f/u;  Overall doing ok;  Pt denies Chest pain, orthopnea, PND, worsening LE edema, palpitations, dizziness or syncope.  Pt denies neurological change such as new headache, facial or extremity weakness.  Pt denies polydipsia, polyuria, or low sugar symptoms. Pt states overall good compliance with treatment and medications, good tolerability, and has been trying to follow appropriate diet.  Pt denies worsening depressive symptoms, suicidal ideation or panic. No fever, night sweats, wt loss, loss of appetite, or other constitutional symptoms.  Pt states good ability with ADL's, has low fall risk, home safety reviewed and adequate, no other significant changes in hearing or vision, and only occasionally active with exercise.   Also pt c/o vertigo mild intermittent positional x 1 wk with nothing else makes better or worse Also c/o mild copd exacerbation, which led her to an appt today instead of waiting to the planned appt later this month, consisting of 1 wk worsening non prod cough, wheezing, sob, doe without fever, chills, CP, diaphoresis, n/v, palps or syncope.   Denies worsening depressive symptoms, suicidal ideation, or panic; has ongoing anxiety, some increased recently with multiple stressors, asking for bid xanax from [facility] only.   Past Medical History:  Diagnosis Date  . ABDOMINAL TENDERNESS 01/21/2011  . ACUTE GINGIVITIS NONPLAQUE INDUCED 06/11/2010  . ADD 09/05/2008  . Alcohol abuse, in remission 09/27/2007  . ALLERGIC RHINITIS 03/02/2009  . ANXIETY 07/02/2007  . BACK PAIN 05/19/2009  . CHRONIC OBSTRUCTIVE PULMONARY DISEASE, ACUTE EXACERBATION 08/06/2009  . COPD 07/02/2007  . DEGENERATIVE JOINT DISEASE, CERVICAL SPINE 07/02/2007  . DEPRESSION 07/02/2007  . GASTROENTERITIS, VIRAL 11/29/2010  . GERD 01/21/2011  . Headache(784.0) 07/02/2007  . HYPOTHYROIDISM 07/02/2007  . Lumbar  disc disease   . Lumbar pain with radiation down right leg 06/01/2011  . OSTEOARTHRITIS 07/02/2007  . Other and unspecified ovarian cyst 05/19/2009  . PAIN, CHRONIC, DUE TO TRAUMA 07/02/2007  . PELVIC  PAIN 03/29/2010  . UTI 05/19/2009  . Vitamin D insufficiency    Past Surgical History:  Procedure Laterality Date  . ABDOMINAL HYSTERECTOMY    . CHOLECYSTECTOMY      reports that she has been smoking. She has never used smokeless tobacco. She reports that she does not drink alcohol or use drugs. family history includes Alcohol abuse in an other family member; Anxiety disorder in an other family member; Coronary artery disease in an other family member; Depression in an other family member; Stroke in an other family member. Allergies  Allergen Reactions  . Morphine Nausea And Vomiting  . Hydrochlorothiazide Other (See Comments)    "sees spots"   Current Outpatient Medications on File Prior to Visit  Medication Sig Dispense Refill  . albuterol (PROVENTIL HFA;VENTOLIN HFA) 108 (90 Base) MCG/ACT inhaler Inhale 2 puffs into the lungs every 6 (six) hours as needed for wheezing or shortness of breath. 1 Inhaler 11  . budesonide-formoterol (SYMBICORT) 80-4.5 MCG/ACT inhaler Inhale 2 puffs into the lungs 2 (two) times daily. 1 Inhaler 11  . cetirizine (ZYRTEC) 10 MG tablet Take 1 tablet (10 mg total) by mouth daily. 30 tablet 11  . diclofenac sodium (VOLTAREN) 1 % GEL Apply 4 g topically 4 (four) times daily as needed. (Patient taking differently: Apply 4 g topically 4 (four) times daily as needed (pain). ) 400 g 11  . estradiol (ESTRACE) 1 MG  tablet TAKE ONE (1) TABLET BY MOUTH EVERY DAY 90 tablet 3  . hydrochlorothiazide (MICROZIDE) 12.5 MG capsule Take 1 capsule (12.5 mg total) by mouth daily. As needed 30 capsule 11  . ibuprofen (ADVIL,MOTRIN) 200 MG tablet Take 200 mg by mouth every 8 (eight) hours as needed for moderate pain.     . rosuvastatin (CRESTOR) 10 MG tablet Take 1 tablet (10 mg total) by  mouth daily. 90 tablet 3  . triamcinolone (NASACORT) 55 MCG/ACT AERO nasal inhaler Place 2 sprays into the nose daily. 1 Inhaler 12  . varenicline (CHANTIX CONTINUING MONTH PAK) 1 MG tablet Take 1 tablet (1 mg total) by mouth 2 (two) times daily. 60 tablet 1  . varenicline (CHANTIX STARTING MONTH PAK) 0.5 MG X 11 & 1 MG X 42 tablet Take one 0.5 mg tablet by mouth once daily for 3 days, then increase to one 0.5 mg tablet twice daily for 4 days, then increase to one 1 mg tablet twice daily. 53 tablet 0  . [DISCONTINUED] FLUoxetine (PROZAC) 20 MG capsule Take 1 capsule (20 mg total) by mouth daily. 30 capsule 11   No current facility-administered medications on file prior to visit.    Review of Systems Constitutional: Negative for other unusual diaphoresis, sweats, appetite or weight changes HENT: Negative for other worsening hearing loss, ear pain, facial swelling, mouth sores or neck stiffness.   Eyes: Negative for other worsening pain, redness or other visual disturbance.  Respiratory: Negative for other stridor or swelling Cardiovascular: Negative for other palpitations or other chest pain  Gastrointestinal: Negative for worsening diarrhea or loose stools, blood in stool, distention or other pain Genitourinary: Negative for hematuria, flank pain or other change in urine volume.  Musculoskeletal: Negative for myalgias or other joint swelling.  Skin: Negative for other color change, or other wound or worsening drainage.  Neurological: Negative for other syncope or numbness. Hematological: Negative for other adenopathy or swelling Psychiatric/Behavioral: Negative for hallucinations, other worsening agitation, SI, self-injury, or new decreased concentration All other system neg per pt    Objective:   Physical Exam BP 136/84   Pulse 73   Temp 98 F (36.7 C) (Oral)   Ht 5' 4.5" (1.638 m)   Wt 135 lb (61.2 kg)   SpO2 98%   BMI 22.81 kg/m  VS noted,  Constitutional: Pt is oriented to  person, place, and time. Appears well-developed and well-nourished, in no significant distress and comfortable Head: Normocephalic and atraumatic  Eyes: Conjunctivae and EOM are normal. Pupils are equal, round, and reactive to light Right Ear: External ear normal without discharge Left Ear: External ear normal without discharge Nose: Nose without discharge or deformity Mouth/Throat: Oropharynx is without other ulcerations and moist  Neck: Normal range of motion. Neck supple. No JVD present. No tracheal deviation present or significant neck LA or mass Cardiovascular: Normal rate, regular rhythm, normal heart sounds and intact distal pulses.   Pulmonary/Chest: WOB normal and breath sounds without rales or wheezing  Abdominal: Soft. Bowel sounds are normal. NT. No HSM  Musculoskeletal: Normal range of motion. Exhibits no edema Lymphadenopathy: Has no other cervical adenopathy.  Neurological: Pt is alert and oriented to person, place, and time. Pt has normal reflexes. No cranial nerve deficit. Motor grossly intact, Gait intact Skin: Skin is warm and dry. No rash noted or new ulcerations Psychiatric:  Has normal mood and affect. Behavior is normal without agitation No other exam findings Lab Results  Component Value Date   WBC  11.5 (H) 12/12/2017   HGB 12.7 12/12/2017   HCT 38.5 12/12/2017   PLT 450.0 (H) 12/12/2017   GLUCOSE 97 12/12/2017   CHOL 169 12/12/2017   TRIG 98.0 12/12/2017   HDL 68.70 12/12/2017   LDLDIRECT 121.4 12/02/2010   LDLCALC 81 12/12/2017   ALT 10 12/12/2017   AST 10 12/12/2017   NA 138 12/12/2017   K 4.0 12/12/2017   CL 102 12/12/2017   CREATININE 0.75 12/12/2017   BUN 19 12/12/2017   CO2 30 12/12/2017   TSH 2.35 12/12/2017   HGBA1C 6.0 12/12/2017       Assessment & Plan:

## 2018-11-27 NOTE — Assessment & Plan Note (Addendum)
Mild to mod, for depomedrol IM 80,  to f/u any worsening symptoms or concerns  In addition to the time spent performing CPE, I spent an additional 25 minutes face to face,in which greater than 50% of this time was spent in counseling and coordination of care for patient's acute illness as documented, including the differential dx, treatment, further evaluation and other management of copd exacerbation, allergic rhinitis, ADD, anxiety, hyperglycemia

## 2018-11-27 NOTE — Assessment & Plan Note (Signed)
Ok for xanax 0.5 bid prn,  to f/u any worsening symptoms or concerns

## 2018-11-27 NOTE — Assessment & Plan Note (Signed)
Also to improve with depomedrol IM 80

## 2018-12-25 ENCOUNTER — Ambulatory Visit: Payer: BLUE CROSS/BLUE SHIELD | Admitting: Internal Medicine

## 2019-01-08 ENCOUNTER — Other Ambulatory Visit: Payer: Self-pay | Admitting: Internal Medicine

## 2019-01-08 NOTE — Telephone Encounter (Signed)
Done erx 

## 2019-02-04 ENCOUNTER — Ambulatory Visit: Payer: BLUE CROSS/BLUE SHIELD | Admitting: Internal Medicine

## 2019-02-04 ENCOUNTER — Other Ambulatory Visit: Payer: Self-pay

## 2019-02-04 ENCOUNTER — Encounter: Payer: Self-pay | Admitting: Internal Medicine

## 2019-02-04 VITALS — Ht 64.5 in

## 2019-02-04 DIAGNOSIS — J069 Acute upper respiratory infection, unspecified: Secondary | ICD-10-CM | POA: Insufficient documentation

## 2019-02-04 DIAGNOSIS — J309 Allergic rhinitis, unspecified: Secondary | ICD-10-CM

## 2019-02-04 DIAGNOSIS — J441 Chronic obstructive pulmonary disease with (acute) exacerbation: Secondary | ICD-10-CM

## 2019-02-04 DIAGNOSIS — T7840XA Allergy, unspecified, initial encounter: Secondary | ICD-10-CM

## 2019-02-04 MED ORDER — LEVOFLOXACIN 500 MG PO TABS: 500.0000 mg | ORAL_TABLET | Freq: Every day | ORAL | 0 refills | Status: AC

## 2019-02-04 MED ORDER — PREDNISONE 10 MG PO TABS: ORAL_TABLET | ORAL | 0 refills | Status: DC

## 2019-02-04 MED ORDER — FLUTICASONE-SALMETEROL 250-50 MCG/DOSE IN AEPB: 1.0000 | INHALATION_SPRAY | Freq: Two times a day (BID) | RESPIRATORY_TRACT | 3 refills | Status: DC

## 2019-02-04 MED ORDER — ALBUTEROL SULFATE HFA 108 (90 BASE) MCG/ACT IN AERS: 2.0000 | INHALATION_SPRAY | Freq: Four times a day (QID) | RESPIRATORY_TRACT | 11 refills | Status: DC | PRN

## 2019-02-04 MED ORDER — HYDROCODONE-HOMATROPINE 5-1.5 MG/5ML PO SYRP: 5.0000 mL | ORAL_SOLUTION | Freq: Four times a day (QID) | ORAL | 0 refills | Status: AC | PRN

## 2019-02-04 MED ORDER — METHYLPREDNISOLONE ACETATE 80 MG/ML IJ SUSP
80.0000 mg | Freq: Once | INTRAMUSCULAR | Status: AC
  Administered 2019-02-04: 80 mg via INTRAMUSCULAR

## 2019-02-04 MED ORDER — AMPHETAMINE-DEXTROAMPHETAMINE 30 MG PO TABS: ORAL_TABLET | ORAL | 0 refills | Status: DC

## 2019-02-04 NOTE — Assessment & Plan Note (Signed)
Also to improve with steroid tx,  to f/u any worsening symptoms or concerns 

## 2019-02-04 NOTE — Assessment & Plan Note (Signed)
Mild to mod, for depomedrol IM 80, predpac asd,,  to f/u any worsening symptoms or concerns 

## 2019-02-04 NOTE — Assessment & Plan Note (Signed)
Mild to mod, for antibx course,  to f/u any worsening symptoms or concerns 

## 2019-02-04 NOTE — Progress Notes (Signed)
Subjective:    Patient ID: Kathy Vargas, female    DOB: [birth year], 68 y.o.   MRN: [id number]  HPI     Here with 2-3 days acute onset fever, facial pain, pressure, headache, general weakness and malaise, and greenish d/c, with mild ST and cough, but pt denies chest pain, wheezing, increased sob or doe, orthopnea, PND, increased LE swelling, palpitations, dizziness or syncope, except for onset mild wheezing since last PM  Pt denies new neurological symptoms such as new headache, or facial or extremity weakness or numbness   Pt denies polydipsia, polyuria  Does have several wks ongoing nasal allergy symptoms with clearish congestion, itch and sneezing, without fever, pain, ST, cough, swelling or wheezing. Past Medical History:  Diagnosis Date  . ABDOMINAL TENDERNESS 01/21/2011  . ACUTE GINGIVITIS NONPLAQUE INDUCED 06/11/2010  . ADD 09/05/2008  . Alcohol abuse, in remission 09/27/2007  . ALLERGIC RHINITIS 03/02/2009  . ANXIETY 07/02/2007  . BACK PAIN 05/19/2009  . CHRONIC OBSTRUCTIVE PULMONARY DISEASE, ACUTE EXACERBATION 08/06/2009  . COPD 07/02/2007  . DEGENERATIVE JOINT DISEASE, CERVICAL SPINE 07/02/2007  . DEPRESSION 07/02/2007  . GASTROENTERITIS, VIRAL 11/29/2010  . GERD 01/21/2011  . Headache(784.0) 07/02/2007  . HYPOTHYROIDISM 07/02/2007  . Lumbar disc disease   . Lumbar pain with radiation down right leg 06/01/2011  . OSTEOARTHRITIS 07/02/2007  . Other and unspecified ovarian cyst 05/19/2009  . PAIN, CHRONIC, DUE TO TRAUMA 07/02/2007  . PELVIC  PAIN 03/29/2010  . UTI 05/19/2009  . Vitamin D insufficiency    Past Surgical History:  Procedure Laterality Date  . ABDOMINAL HYSTERECTOMY    . CHOLECYSTECTOMY      reports that she has been smoking. She has never used smokeless tobacco. She reports that she does not drink alcohol or use drugs. family history includes Alcohol abuse in an other family member; Anxiety disorder in an other family member; Coronary artery disease in an other family member;  Depression in an other family member; Stroke in an other family member. Allergies  Allergen Reactions  . Morphine Nausea And Vomiting  . Hydrochlorothiazide Other (See Comments)    "sees spots"   Current Outpatient Medications on File Prior to Visit  Medication Sig Dispense Refill  . ALPRAZolam (XANAX) 0.5 MG tablet Take 1 tablet (0.5 mg total) by mouth 2 (two) times daily as needed. 60 tablet 5  . cetirizine (ZYRTEC) 10 MG tablet Take 1 tablet (10 mg total) by mouth daily. 30 tablet 11  . diclofenac sodium (VOLTAREN) 1 % GEL Apply 4 g topically 4 (four) times daily as needed. (Patient taking differently: Apply 4 g topically 4 (four) times daily as needed (pain). ) 400 g 11  . estradiol (ESTRACE) 1 MG tablet TAKE ONE (1) TABLET BY MOUTH EVERY DAY 90 tablet 3  . hydrochlorothiazide (MICROZIDE) 12.5 MG capsule Take 1 capsule (12.5 mg total) by mouth daily. As needed 30 capsule 11  . ibuprofen (ADVIL,MOTRIN) 200 MG tablet Take 200 mg by mouth every 8 (eight) hours as needed for moderate pain.     . meclizine (ANTIVERT) 12.5 MG tablet Take 1 tablet (12.5 mg total) by mouth 3 (three) times daily as needed for dizziness. 30 tablet 1  . rosuvastatin (CRESTOR) 10 MG tablet Take 1 tablet (10 mg total) by mouth daily. 90 tablet 3  . triamcinolone (NASACORT) 55 MCG/ACT AERO nasal inhaler Place 2 sprays into the nose daily. 1 Inhaler 12  . [DISCONTINUED] FLUoxetine (PROZAC) 20 MG capsule Take 1 capsule (20 mg total)  by mouth daily. 30 capsule 11   No current facility-administered medications on file prior to visit.    Review of Systems  Constitutional: Negative for other unusual diaphoresis or sweats HENT: Negative for ear discharge or swelling Eyes: Negative for other worsening visual disturbances Respiratory: Negative for stridor or other swelling  Gastrointestinal: Negative for worsening distension or other blood Genitourinary: Negative for retention or other urinary change Musculoskeletal:  Negative for other MSK pain or swelling Skin: Negative for color change or other new lesions Neurological: Negative for worsening tremors and other numbness  Psychiatric/Behavioral: Negative for worsening agitation or other fatigue All other system neg per pt    Objective:   Physical Exam Ht 5' 4.5" (1.638 m)   BMI 22.81 kg/m  VS noted, mild ill Constitutional: Pt appears in NAD HENT: Head: NCAT.  Right Ear: External ear normal.  Left Ear: External ear normal.  Eyes: . Pupils are equal, round, and reactive to light. Conjunctivae and EOM are normal Bilat tm's with mild erythema.  Max sinus areas mild tender.  Pharynx with mild erythema, no exudate Nose: without d/c or deformity Neck: Neck supple. Gross normal ROM Cardiovascular: Normal rate and regular rhythm.   Pulmonary/Chest: Effort normal and breath sounds decreased without rales but with few bilat wheezing.  Abd:  Soft, NT, ND, + BS, no organomegaly Neurological: Pt is alert. At baseline orientation, motor grossly intact Skin: Skin is warm. No rashes, other new lesions, no LE edema Psychiatric: Pt behavior is normal without agitation  No other exam findings Lab Results  Component Value Date   WBC 11.5 (H) 12/12/2017   HGB 12.7 12/12/2017   HCT 38.5 12/12/2017   PLT 450.0 (H) 12/12/2017   GLUCOSE 97 12/12/2017   CHOL 169 12/12/2017   TRIG 98.0 12/12/2017   HDL 68.70 12/12/2017   LDLDIRECT 121.4 12/02/2010   LDLCALC 81 12/12/2017   ALT 10 12/12/2017   AST 10 12/12/2017   NA 138 12/12/2017   K 4.0 12/12/2017   CL 102 12/12/2017   CREATININE 0.75 12/12/2017   BUN 19 12/12/2017   CO2 30 12/12/2017   TSH 2.35 12/12/2017   HGBA1C 6.0 12/12/2017       Assessment & Plan:

## 2019-02-04 NOTE — Patient Instructions (Addendum)
You had the steroid shot today  Please take all new medication as prescribed - the antibiotic, cough medicine, and prednisone  OK to try change the symbicort to Advair  You can also take Mucinex (or it's generic off brand) for congestion, and tylenol as needed for pain.  Please continue all other medications as before, and refills have been done if requested.  Please have the pharmacy call with any other refills you may need.  Please keep your appointments with your specialists as you may have planned

## 2019-02-14 ENCOUNTER — Telehealth: Payer: Self-pay | Admitting: Internal Medicine

## 2019-02-14 ENCOUNTER — Other Ambulatory Visit: Payer: Self-pay | Admitting: Internal Medicine

## 2019-02-15 NOTE — Telephone Encounter (Signed)
Please advise 

## 2019-02-17 ENCOUNTER — Other Ambulatory Visit: Payer: Self-pay | Admitting: Internal Medicine

## 2019-02-18 MED ORDER — PREDNISONE 10 MG PO TABS: ORAL_TABLET | ORAL | 0 refills | Status: DC

## 2019-02-19 ENCOUNTER — Telehealth: Payer: Self-pay | Admitting: Emergency Medicine

## 2019-02-19 MED ORDER — PREDNISONE 10 MG PO TABS: ORAL_TABLET | ORAL | 0 refills | Status: DC

## 2019-02-19 NOTE — Telephone Encounter (Signed)
Ok, this is done 

## 2019-02-19 NOTE — Addendum Note (Signed)
Addended by: Corwin Levins on: 02/19/2019 04:14 PM   Modules accepted: Orders

## 2019-02-19 NOTE — Telephone Encounter (Signed)
Pt came in requesting that the refill on her predniSONE (DELTASONE) 10 MG tablet be sent to [facility]. Instead of [facility] because they are closed. Thanks

## 2019-03-01 MED FILL — predniSONE 10 MG TABS: 10 | 9 days supply | Qty: 18 | Fill #0

## 2019-03-10 NOTE — Telephone Encounter (Signed)
error 

## 2019-03-11 ENCOUNTER — Other Ambulatory Visit: Payer: Self-pay | Admitting: Internal Medicine

## 2019-03-11 MED FILL — ALPRAZolam 0.5 MG TABS: 0.5 | 30 days supply | Qty: 60 | Fill #0

## 2019-03-11 MED FILL — DEXTROAMPH TB 30MG NSTR 100: 30 | 30 days supply | Qty: 60 | Fill #0

## 2019-03-11 NOTE — Telephone Encounter (Signed)
Requested medication (s) are due for refill today: yes  Requested medication (s) are on the active medication list: yes  Last refill:  02/04/19  Future visit scheduled: yes  Notes to clinic:  Medication refill on this drug not delegated to NT    Requested Prescriptions  Pending Prescriptions Disp Refills   amphetamine-dextroamphetamine (ADDERALL) 30 MG tablet [Pharmacy Med Name: AMPHETAMINE SALTS 30 MG TAB 30 TAB] 60 tablet 0    Sig: TAKE 1 TABLET BY MOUTH TWICE PER DAY     Not Delegated - Psychiatry:  Stimulants/ADHD Failed - 03/11/2019  1:53 PM      Failed - This refill cannot be delegated      Failed - Urine Drug Screen completed in last 360 days.      Failed - Valid encounter within last 3 months    Recent Outpatient Visits          1 month ago Acute URI   [facility] -Clair Gulling, MD   3 months ago Encounter for well adult exam with abnormal findings   [facility] -Clair Gulling, MD   8 months ago Allergic rhinitis, unspecified seasonality, unspecified trigger   [facility] -Clair Gulling, MD   11 months ago COPD exacerbation [facility])   [facility] -Clair Gulling, MD   1 year ago COPD exacerbation [facility])   [facility] -Clair Gulling, MD      Future Appointments            In 2 months Jonny Ruiz, Len Blalock, MD [facility] -[place], [facility]

## 2019-03-11 NOTE — Telephone Encounter (Signed)
Done erx to [facility] outpatient pharm

## 2019-04-19 ENCOUNTER — Other Ambulatory Visit: Payer: Self-pay | Admitting: Internal Medicine

## 2019-04-19 MED FILL — ALPRAZolam 0.5 MG TABS: 0.5 | 30 days supply | Qty: 60 | Fill #1

## 2019-04-19 MED FILL — ADVAIR 250/50 DISKUS: 250-50 | 30 days supply | Qty: 60 | Fill #0

## 2019-04-19 MED FILL — AMPHETAMINE-DEXTROAMPHETAMI: 30 | 30 days supply | Qty: 60 | Fill #0

## 2019-04-19 NOTE — Telephone Encounter (Signed)
Done erx 

## 2019-05-29 ENCOUNTER — Other Ambulatory Visit (INDEPENDENT_AMBULATORY_CARE_PROVIDER_SITE_OTHER): Payer: BC Managed Care – PPO

## 2019-05-29 ENCOUNTER — Other Ambulatory Visit: Payer: Self-pay

## 2019-05-29 ENCOUNTER — Ambulatory Visit (INDEPENDENT_AMBULATORY_CARE_PROVIDER_SITE_OTHER): Payer: BC Managed Care – PPO | Admitting: Internal Medicine

## 2019-05-29 ENCOUNTER — Encounter: Payer: Self-pay | Admitting: Internal Medicine

## 2019-05-29 VITALS — BP 118/78 | HR 84 | Temp 97.9°F | Ht 64.5 in | Wt 133.0 lb

## 2019-05-29 DIAGNOSIS — E890 Postprocedural hypothyroidism: Secondary | ICD-10-CM | POA: Diagnosis not present

## 2019-05-29 DIAGNOSIS — E538 Deficiency of other specified B group vitamins: Secondary | ICD-10-CM

## 2019-05-29 DIAGNOSIS — E559 Vitamin D deficiency, unspecified: Secondary | ICD-10-CM

## 2019-05-29 DIAGNOSIS — E611 Iron deficiency: Secondary | ICD-10-CM

## 2019-05-29 DIAGNOSIS — R739 Hyperglycemia, unspecified: Secondary | ICD-10-CM

## 2019-05-29 DIAGNOSIS — E785 Hyperlipidemia, unspecified: Secondary | ICD-10-CM

## 2019-05-29 LAB — LIPID PANEL
Cholesterol: 214 mg/dL — ABNORMAL HIGH (ref 0–200)
HDL: 70.9 mg/dL (ref >39.00)
LDL Cholesterol: 129 mg/dL — ABNORMAL HIGH (ref 0–99)
NonHDL: 143.29
Total CHOL/HDL Ratio: 3
Triglycerides: 70 mg/dL (ref 0.0–149.0)
VLDL: 14 mg/dL (ref 0.0–40.0)

## 2019-05-29 LAB — BASIC METABOLIC PANEL
BUN: 22 mg/dL (ref 6–23)
CO2: 26 mEq/L (ref 19–32)
Calcium: 9 mg/dL (ref 8.4–10.5)
Chloride: 105 mEq/L (ref 96–112)
Creatinine, Ser: 1.12 mg/dL (ref 0.40–1.20)
GFR: 48.39 mL/min — ABNORMAL LOW (ref >60.00)
Glucose, Bld: 96 mg/dL (ref 70–99)
Potassium: 4.2 mEq/L (ref 3.5–5.1)
Sodium: 139 mEq/L (ref 135–145)

## 2019-05-29 LAB — HEMOGLOBIN A1C: Hgb A1c MFr Bld: 5.9 % (ref 4.6–6.5)

## 2019-05-29 LAB — CBC WITH DIFFERENTIAL/PLATELET
Basophils Absolute: 0.1 10*3/uL (ref 0.0–0.1)
Basophils Relative: 1 % (ref 0.0–3.0)
Eosinophils Absolute: 0.2 10*3/uL (ref 0.0–0.7)
Eosinophils Relative: 2.5 % (ref 0.0–5.0)
HCT: 40 % (ref 36.0–46.0)
Hemoglobin: 13.1 g/dL (ref 12.0–15.0)
Lymphocytes Relative: 32.2 % (ref 12.0–46.0)
Lymphs Abs: 2.2 10*3/uL (ref 0.7–4.0)
MCHC: 32.9 g/dL (ref 30.0–36.0)
MCV: 86.1 fl (ref 78.0–100.0)
Monocytes Absolute: 0.7 10*3/uL (ref 0.1–1.0)
Monocytes Relative: 9.6 % (ref 3.0–12.0)
Neutro Abs: 3.8 10*3/uL (ref 1.4–7.7)
Neutrophils Relative %: 54.7 % (ref 43.0–77.0)
Platelets: 322 10*3/uL (ref 150.0–400.0)
RBC: 4.64 Mil/uL (ref 3.87–5.11)
RDW: 13.7 % (ref 11.5–15.5)
WBC: 6.9 10*3/uL (ref 4.0–10.5)

## 2019-05-29 LAB — IBC PANEL
Iron: 67 ug/dL (ref 42–145)
Saturation Ratios: 17.7 % — ABNORMAL LOW (ref 20.0–50.0)
Transferrin: 271 mg/dL (ref 212.0–360.0)

## 2019-05-29 LAB — HEPATIC FUNCTION PANEL
ALT: 15 U/L (ref 0–35)
AST: 18 U/L (ref 0–37)
Albumin: 4.4 g/dL (ref 3.5–5.2)
Alkaline Phosphatase: 73 U/L (ref 39–117)
Bilirubin, Direct: 0.1 mg/dL (ref 0.0–0.3)
Total Bilirubin: 0.5 mg/dL (ref 0.2–1.2)
Total Protein: 7.1 g/dL (ref 6.0–8.3)

## 2019-05-29 LAB — TSH: TSH: 1.43 u[IU]/mL (ref 0.35–4.50)

## 2019-05-29 LAB — T4, FREE: Free T4: 1.02 ng/dL (ref 0.60–1.60)

## 2019-05-29 LAB — VITAMIN B12: Vitamin B-12: 455 pg/mL (ref 211–911)

## 2019-05-29 MED ORDER — AMPHETAMINE-DEXTROAMPHETAMINE 30 MG PO TABS: ORAL_TABLET | ORAL | 0 refills | Status: DC

## 2019-05-29 MED ORDER — FLUTICASONE-SALMETEROL 250-50 MCG/DOSE IN AEPB: 1.0000 | INHALATION_SPRAY | Freq: Two times a day (BID) | RESPIRATORY_TRACT | 3 refills | Status: DC

## 2019-05-29 MED ORDER — ALBUTEROL SULFATE HFA 108 (90 BASE) MCG/ACT IN AERS: 2.0000 | INHALATION_SPRAY | Freq: Four times a day (QID) | RESPIRATORY_TRACT | 5 refills | Status: DC | PRN

## 2019-05-29 MED ORDER — ALPRAZOLAM 0.5 MG PO TABS: 0.5000 mg | ORAL_TABLET | Freq: Two times a day (BID) | ORAL | 5 refills | Status: DC | PRN

## 2019-05-29 NOTE — Patient Instructions (Signed)

## 2019-05-29 NOTE — Assessment & Plan Note (Signed)
stable overall by history and exam, recent data reviewed with pt, and pt to continue medical treatment as before,  to f/u any worsening symptoms or concerns  

## 2019-05-29 NOTE — Progress Notes (Signed)
Subjective:    Patient ID: Kathy Vargas, female    DOB: [birth year], 68 y.o.   MRN: [id number]  HPI  Here to f/u; overall doing ok,  Pt denies chest pain, increasing sob or doe, wheezing, orthopnea, PND, increased LE swelling, palpitations, dizziness or syncope.  Pt denies new neurological symptoms such as new headache, or facial or extremity weakness or numbness.  Pt denies polydipsia, polyuria, or low sugar episode.  Pt states overall good compliance with meds, mostly trying to follow appropriate diet, with wt overall stable, Does c/o ongoing fatigue, but denies signficant daytime hypersomnolence.  Pt denies fever, wt loss, night sweats, loss of appetite, or other constitutional symptoms  Denies hyper or hypo thyroid symptoms such as voice, skin or hair change. Past Medical History:  Diagnosis Date  . ABDOMINAL TENDERNESS 01/21/2011  . ACUTE GINGIVITIS NONPLAQUE INDUCED 06/11/2010  . ADD 09/05/2008  . Alcohol abuse, in remission 09/27/2007  . ALLERGIC RHINITIS 03/02/2009  . ANXIETY 07/02/2007  . BACK PAIN 05/19/2009  . CHRONIC OBSTRUCTIVE PULMONARY DISEASE, ACUTE EXACERBATION 08/06/2009  . COPD 07/02/2007  . DEGENERATIVE JOINT DISEASE, CERVICAL SPINE 07/02/2007  . DEPRESSION 07/02/2007  . GASTROENTERITIS, VIRAL 11/29/2010  . GERD 01/21/2011  . Headache(784.0) 07/02/2007  . HYPOTHYROIDISM 07/02/2007  . Lumbar disc disease   . Lumbar pain with radiation down right leg 06/01/2011  . OSTEOARTHRITIS 07/02/2007  . Other and unspecified ovarian cyst 05/19/2009  . PAIN, CHRONIC, DUE TO TRAUMA 07/02/2007  . PELVIC  PAIN 03/29/2010  . UTI 05/19/2009  . Vitamin D insufficiency    Past Surgical History:  Procedure Laterality Date  . ABDOMINAL HYSTERECTOMY    . CHOLECYSTECTOMY      reports that she has been smoking. She has never used smokeless tobacco. She reports that she does not drink alcohol or use drugs. family history includes Alcohol abuse in an other family member; Anxiety disorder in an other family  member; Coronary artery disease in an other family member; Depression in an other family member; Stroke in an other family member. Allergies  Allergen Reactions  . Morphine Nausea And Vomiting  . Hydrochlorothiazide Other (See Comments)    "sees spots"   Current Outpatient Medications on File Prior to Visit  Medication Sig Dispense Refill  . cetirizine (ZYRTEC) 10 MG tablet Take 1 tablet (10 mg total) by mouth daily. 30 tablet 11  . diclofenac sodium (VOLTAREN) 1 % GEL Apply 4 g topically 4 (four) times daily as needed. (Patient taking differently: Apply 4 g topically 4 (four) times daily as needed (pain). ) 400 g 11  . estradiol (ESTRACE) 1 MG tablet TAKE ONE (1) TABLET BY MOUTH EVERY DAY 90 tablet 3  . hydrochlorothiazide (MICROZIDE) 12.5 MG capsule Take 1 capsule (12.5 mg total) by mouth daily. As needed 30 capsule 11  . ibuprofen (ADVIL,MOTRIN) 200 MG tablet Take 200 mg by mouth every 8 (eight) hours as needed for moderate pain.     . meclizine (ANTIVERT) 12.5 MG tablet Take 1 tablet (12.5 mg total) by mouth 3 (three) times daily as needed for dizziness. 30 tablet 1  . predniSONE (DELTASONE) 10 MG tablet 3 tabs by mouth per day for 3 days,2tabs per day for 3 days,1tab per day for 3 days 18 tablet 0  . rosuvastatin (CRESTOR) 10 MG tablet Take 1 tablet (10 mg total) by mouth daily. 90 tablet 3  . triamcinolone (NASACORT) 55 MCG/ACT AERO nasal inhaler Place 2 sprays into the nose daily. 1 Inhaler 12  . [  DISCONTINUED] FLUoxetine (PROZAC) 20 MG capsule Take 1 capsule (20 mg total) by mouth daily. 30 capsule 11   No current facility-administered medications on file prior to visit.    Review of Systems  Constitutional: Negative for other unusual diaphoresis or sweats HENT: Negative for ear discharge or swelling Eyes: Negative for other worsening visual disturbances Respiratory: Negative for stridor or other swelling  Gastrointestinal: Negative for worsening distension or other blood  Genitourinary: Negative for retention or other urinary change Musculoskeletal: Negative for other MSK pain or swelling Skin: Negative for color change or other new lesions Neurological: Negative for worsening tremors and other numbness  Psychiatric/Behavioral: Negative for worsening agitation or other fatigue All other system neg pe rpt    Objective:   Physical Exam BP 118/78   Pulse 84   Temp 97.9 F (36.6 C) (Oral)   Ht 5' 4.5" (1.638 m)   Wt 133 lb (60.3 kg)   SpO2 96%   BMI 22.48 kg/m  VS noted,  Constitutional: Pt appears in NAD HENT: Head: NCAT.  Right Ear: External ear normal.  Left Ear: External ear normal.  Eyes: . Pupils are equal, round, and reactive to light. Conjunctivae and EOM are normal Nose: without d/c or deformity Neck: Neck supple. Gross normal ROM Cardiovascular: Normal rate and regular rhythm.   Pulmonary/Chest: Effort normal and breath sounds without rales or wheezing.  Abd:  Soft, NT, ND, + BS, no organomegaly Neurological: Pt is alert. At baseline orientation, motor grossly intact Skin: Skin is warm. No rashes, other new lesions, no LE edema Psychiatric: Pt behavior is normal without agitation  No other exam findings  Lab Results  Component Value Date   WBC 11.5 (H) 12/12/2017   HGB 12.7 12/12/2017   HCT 38.5 12/12/2017   PLT 450.0 (H) 12/12/2017   GLUCOSE 97 12/12/2017   CHOL 169 12/12/2017   TRIG 98.0 12/12/2017   HDL 68.70 12/12/2017   LDLDIRECT 121.4 12/02/2010   LDLCALC 81 12/12/2017   ALT 10 12/12/2017   AST 10 12/12/2017   NA 138 12/12/2017   K 4.0 12/12/2017   CL 102 12/12/2017   CREATININE 0.75 12/12/2017   BUN 19 12/12/2017   CO2 30 12/12/2017   TSH 2.35 12/12/2017   HGBA1C 6.0 12/12/2017       Assessment & Plan:

## 2019-05-30 ENCOUNTER — Encounter: Payer: Self-pay | Admitting: Internal Medicine

## 2019-05-30 LAB — URINALYSIS, ROUTINE W REFLEX MICROSCOPIC
Bilirubin Urine: NEGATIVE
Ketones, ur: NEGATIVE
Nitrite: NEGATIVE
Specific Gravity, Urine: 1.02 (ref 1.000–1.030)
Total Protein, Urine: 100 — AB
Urine Glucose: NEGATIVE
Urobilinogen, UA: 0.2 (ref 0.0–1.0)
pH: 6 (ref 5.0–8.0)

## 2019-05-30 LAB — VITAMIN D 25 HYDROXY (VIT D DEFICIENCY, FRACTURES): VITD: 43.52 ng/mL (ref 30.00–100.00)

## 2019-07-01 ENCOUNTER — Other Ambulatory Visit: Payer: Self-pay | Admitting: Internal Medicine

## 2019-07-01 NOTE — Telephone Encounter (Signed)
Done erx 

## 2019-09-02 ENCOUNTER — Other Ambulatory Visit: Payer: Self-pay | Admitting: Internal Medicine

## 2019-09-02 NOTE — Telephone Encounter (Signed)
Done erx 

## 2019-10-02 ENCOUNTER — Other Ambulatory Visit: Payer: Self-pay

## 2019-10-02 ENCOUNTER — Ambulatory Visit (INDEPENDENT_AMBULATORY_CARE_PROVIDER_SITE_OTHER): Payer: BC Managed Care – PPO

## 2019-10-02 DIAGNOSIS — Z23 Encounter for immunization: Secondary | ICD-10-CM | POA: Diagnosis not present

## 2019-10-08 ENCOUNTER — Other Ambulatory Visit: Payer: Self-pay | Admitting: Internal Medicine

## 2019-10-08 NOTE — Telephone Encounter (Signed)
done erx 

## 2019-11-04 ENCOUNTER — Telehealth: Payer: Self-pay | Admitting: Internal Medicine

## 2019-11-04 NOTE — Telephone Encounter (Signed)
Patient spoke with [facility] on 11/02/19 at 1:54pm stating that her ankle was swollen.  Patient was advised to see PCP within 4 hours.  I tried to reach patient this morning and was not able to.  I have left pt a vm to call back to schedule an appt if needed.

## 2019-11-04 NOTE — Telephone Encounter (Signed)
Noted  

## 2019-11-05 ENCOUNTER — Ambulatory Visit: Payer: Self-pay

## 2019-11-05 DIAGNOSIS — L03116 Cellulitis of left lower limb: Secondary | ICD-10-CM | POA: Diagnosis not present

## 2019-11-05 NOTE — Telephone Encounter (Signed)
Pt likely should go to ED or UC now to rule out DVT; this is the best I can do from an email

## 2019-11-05 NOTE — Telephone Encounter (Signed)
Patient called stating she has swelling and pain to her right lower leg.  She states she is unaware of any injury.  She states that the lower leg is warm to touch with burning pain as she is on the leg all day at work. This all started on Thursday.  She states a small red area will form just above her ankle after standing on the leg a while.  She feel that maybe its a bug bite. She rates the pain at 7 now but by the end of her workday it is a 10. She denies fever stating that she has her temperature taken everyday before she can enter her work area. "just her leg has a fever". Care advice read to patient. Patient states she does not want to go to ER and request permission to come to office. Call to office. Will route note per request for Dr Jenny Reichmann to review and advise patient.  Patient verbalized understanding. She states she is at work and will be listening for the call.  Reason for Disposition . [1] Red area or streak AND [2] fever  Answer Assessment - Initial Assessment Questions 1. ONSET: "When did the pain start?"      Thursday 2. LOCATION: "Where is the pain located?"     Rt shin area above ankle 3. PAIN: "How bad is the pain?"    (Scale 1-10; or mild, moderate, severe)   -  MILD (1-3): doesn't interfere with normal activities    -  MODERATE (4-7): interferes with normal activities (e.g., work or school) or awakens from sleep, limping    -  SEVERE (8-10): excruciating pain, unable to do any normal activities, unable to walk    7-10 4. WORK OR EXERCISE: "Has there been any recent work or exercise that involved this part of the body?"      One feet all day no injury  5. CAUSE: "What do you think is causing the leg pain?"    Unsure burn  6. OTHER SYMPTOMS: "Do you have any other symptoms?" (e.g., chest pain, back pain, breathing difficulty, swelling, rash, fever, numbness, weakness)   Swelling front of leg warm to touch, faint red 7. PREGNANCY: "Is there any chance you are pregnant?" "When was  your last menstrual period?"     N/A  Protocols used: LEG PAIN-A-AH

## 2019-11-06 NOTE — Telephone Encounter (Signed)
Called pt, LVM.   

## 2019-11-19 ENCOUNTER — Other Ambulatory Visit: Payer: Self-pay | Admitting: Internal Medicine

## 2019-11-19 NOTE — Telephone Encounter (Signed)
Done erx 

## 2019-11-29 ENCOUNTER — Ambulatory Visit (INDEPENDENT_AMBULATORY_CARE_PROVIDER_SITE_OTHER): Payer: BC Managed Care – PPO | Admitting: Internal Medicine

## 2019-11-29 ENCOUNTER — Other Ambulatory Visit: Payer: Self-pay

## 2019-11-29 ENCOUNTER — Encounter: Payer: Self-pay | Admitting: Internal Medicine

## 2019-11-29 VITALS — BP 136/88 | HR 87 | Temp 97.6°F | Resp 12 | Ht 64.5 in | Wt 127.0 lb

## 2019-11-29 DIAGNOSIS — R739 Hyperglycemia, unspecified: Secondary | ICD-10-CM

## 2019-11-29 DIAGNOSIS — F32A Depression, unspecified: Secondary | ICD-10-CM

## 2019-11-29 DIAGNOSIS — M545 Low back pain, unspecified: Secondary | ICD-10-CM

## 2019-11-29 DIAGNOSIS — J438 Other emphysema: Secondary | ICD-10-CM | POA: Diagnosis not present

## 2019-11-29 DIAGNOSIS — F329 Major depressive disorder, single episode, unspecified: Secondary | ICD-10-CM | POA: Diagnosis not present

## 2019-11-29 DIAGNOSIS — Z0000 Encounter for general adult medical examination without abnormal findings: Secondary | ICD-10-CM

## 2019-11-29 DIAGNOSIS — Z0001 Encounter for general adult medical examination with abnormal findings: Secondary | ICD-10-CM

## 2019-11-29 MED ORDER — ALPRAZOLAM 0.5 MG PO TABS: 0.5000 mg | ORAL_TABLET | Freq: Two times a day (BID) | ORAL | 5 refills | Status: DC | PRN

## 2019-11-29 MED ORDER — FLUTICASONE-SALMETEROL 250-50 MCG/DOSE IN AEPB: 1.0000 | INHALATION_SPRAY | Freq: Two times a day (BID) | RESPIRATORY_TRACT | 1 refills | Status: DC

## 2019-11-29 MED ORDER — ADDERALL 30 MG PO TABS: ORAL_TABLET | ORAL | 0 refills | Status: DC

## 2019-11-29 MED ORDER — METHYLPREDNISOLONE ACETATE 80 MG/ML IJ SUSP
80.0000 mg | Freq: Once | INTRAMUSCULAR | Status: AC
  Administered 2019-11-29: 80 mg via INTRAMUSCULAR

## 2019-11-29 NOTE — Patient Instructions (Addendum)
You had the steroid shot today  Please continue all other medications as before, and refills have been done if requested. - the adderall, xanax and advair Please have the pharmacy call with any other refills you may need.  Please continue your efforts at being more active, low cholesterol diet, and weight control.  You are otherwise up to date with prevention measures today.  Please keep your appointments with your specialists as you may have planned  Please return in 6 months, or sooner if needed

## 2019-11-29 NOTE — Progress Notes (Signed)
Subjective:    Patient ID: Kathy Vargas, female    DOB: Aug 15, 1951, 69 y.o.   MRN: [id number]  HPI  Here for wellness and f/u;  Overall doing ok;  Pt denies Chest pain, worsening SOB, DOE, wheezing, orthopnea, PND, worsening LE edema, palpitations, dizziness or syncope.  Pt denies neurological change such as new headache, facial or extremity weakness.  Pt denies polydipsia, polyuria, or low sugar symptoms. Pt states overall good compliance with treatment and medications, good tolerability, and has been trying to follow appropriate diet.  Pt denies worsening depressive symptoms, suicidal ideation or panic. No fever, night sweats, wt loss, loss of appetite, or other constitutional symptoms.  Pt states good ability with ADL's, has low fall risk, home safety reviewed and adequate, no other significant changes in hearing or vision, and only occasionally active with exercise.  Tx with what sounds like augmentin for RLE cellulitis resolved now about 1 mo ago   Also, .Pt c/o 1 wk  acute on chronic LBP without change in severity, bowel or bladder change, fever, wt loss,  worsening LE pain/numbness/weakness, gait change or falls.  Nothing seems to make better or worse.  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 ABDOMINAL TENDERNESS 01/21/2011  . ACUTE GINGIVITIS NONPLAQUE INDUCED 06/11/2010  . ADD 09/05/2008  . Alcohol abuse, in remission 09/27/2007  . ALLERGIC RHINITIS 03/02/2009  . ANXIETY 07/02/2007  . BACK PAIN 05/19/2009  . CHRONIC OBSTRUCTIVE PULMONARY DISEASE, ACUTE EXACERBATION 08/06/2009  . COPD 07/02/2007  . DEGENERATIVE JOINT DISEASE, CERVICAL SPINE 07/02/2007  . DEPRESSION 07/02/2007  . GASTROENTERITIS, VIRAL 11/29/2010  . GERD 01/21/2011  . Headache(784.0) 07/02/2007  . HYPOTHYROIDISM 07/02/2007  . Lumbar disc disease   . Lumbar pain with radiation down right leg 06/01/2011  . OSTEOARTHRITIS 07/02/2007  . Other and unspecified  ovarian cyst 05/19/2009  . PAIN, CHRONIC, DUE TO TRAUMA 07/02/2007  . PELVIC  PAIN 03/29/2010  . UTI 05/19/2009  . Vitamin D insufficiency    Past Surgical History:  Procedure Laterality Date  . ABDOMINAL HYSTERECTOMY    . CHOLECYSTECTOMY      reports that she has been smoking. She has never used smokeless tobacco. She reports that she does not drink alcohol or use drugs. family history includes Alcohol abuse in an other family member; Anxiety disorder in an other family member; Coronary artery disease in an other family member; Depression in an other family member; Stroke in an other family member. Allergies  Allergen Reactions  . Morphine Nausea And Vomiting  . Hydrochlorothiazide Other (See Comments)    "sees spots"   Current Outpatient Medications on File Prior to Visit  Medication Sig Dispense Refill  . albuterol (VENTOLIN HFA) 108 (90 Base) MCG/ACT inhaler Inhale 2 puffs into the lungs every 6 (six) hours as needed for wheezing or shortness of breath. 18 g 5  . cetirizine (ZYRTEC) 10 MG tablet Take 1 tablet (10 mg total) by mouth daily. 30 tablet 11  . diclofenac sodium (VOLTAREN) 1 % GEL Apply 4 g topically 4 (four) times daily as needed. (Patient not taking: Reported on 11/29/2019) 400 g 11  . estradiol (ESTRACE) 1 MG tablet TAKE ONE (1) TABLET BY MOUTH EVERY DAY (Patient not taking: Reported on 11/29/2019) 90 tablet 3  . hydrochlorothiazide (MICROZIDE) 12.5 MG capsule Take 1 capsule (12.5 mg total) by mouth daily. As needed (Patient not taking: Reported on 11/29/2019) 30 capsule 11  .  ibuprofen (ADVIL,MOTRIN) 200 MG tablet Take 200 mg by mouth every 8 (eight) hours as needed for moderate pain.     . predniSONE (DELTASONE) 10 MG tablet 3 tabs by mouth per day for 3 days,2tabs per day for 3 days,1tab per day for 3 days (Patient not taking: Reported on 11/29/2019) 18 tablet 0  . rosuvastatin (CRESTOR) 10 MG tablet Take 1 tablet (10 mg total) by mouth daily. (Patient not taking: Reported on  11/29/2019) 90 tablet 3  . triamcinolone (NASACORT) 55 MCG/ACT AERO nasal inhaler Place 2 sprays into the nose daily. (Patient not taking: Reported on 11/29/2019) 1 Inhaler 12  . [DISCONTINUED] FLUoxetine (PROZAC) 20 MG capsule Take 1 capsule (20 mg total) by mouth daily. 30 capsule 11   No current facility-administered medications on file prior to visit.   Review of Systems Constitutional: Negative for other unusual diaphoresis, sweats, appetite or weight changes HENT: Negative for other worsening hearing loss, ear pain, facial swelling, mouth sores or neck stiffness.   Eyes: Negative for other worsening pain, redness or other visual disturbance.  Respiratory: Negative for other stridor or swelling Cardiovascular: Negative for other palpitations or other chest pain  Gastrointestinal: Negative for worsening diarrhea or loose stools, blood in stool, distention or other pain Genitourinary: Negative for hematuria, flank pain or other change in urine volume.  Musculoskeletal: Negative for myalgias or other joint swelling.  Skin: Negative for other color change, or other wound or worsening drainage.  Neurological: Negative for other syncope or numbness. Hematological: Negative for other adenopathy or swelling Psychiatric/Behavioral: Negative for hallucinations, other worsening agitation, SI, self-injury, or new decreased concentration All otherwise neg per pt     Objective:   Physical Exam BP 136/88   Pulse 87   Temp 97.6 F (36.4 C)   Resp 12   Ht 5' 4.5" (1.638 m)   Wt 127 lb (57.6 kg)   SpO2 99%   BMI 21.46 kg/m  VS noted,  Constitutional: Pt is oriented to person, place, and time. Appears well-developed and well-nourished, in no significant distress and comfortable Head: Normocephalic and atraumatic  Eyes: Conjunctivae and EOM are normal. Pupils are equal, round, and reactive to light Right Ear: External ear normal without discharge Left Ear: External ear normal without  discharge Nose: Nose without discharge or deformity Mouth/Throat: Oropharynx is without other ulcerations and moist  Neck: Normal range of motion. Neck supple. No JVD present. No tracheal deviation present or significant neck LA or mass Cardiovascular: Normal rate, regular rhythm, normal heart sounds and intact distal pulses.   Pulmonary/Chest: WOB normal and breath sounds without rales or wheezing  Abdominal: Soft. Bowel sounds are normal. NT. No HSM  Musculoskeletal: Normal range of motion. Exhibits no edema Lymphadenopathy: Has no other cervical adenopathy.  Neurological: Pt is alert and oriented to person, place, and time. Pt has normal reflexes. No cranial nerve deficit. Motor grossly intact, Gait intact Skin: Skin is warm and dry. No rash noted or new ulcerations Psychiatric:  Has normal mood and affect. Behavior is normal without agitation All otherwise neg per pt Lab Results  Component Value Date   WBC 6.9 05/29/2019   HGB 13.1 05/29/2019   HCT 40.0 05/29/2019   PLT 322.0 05/29/2019   GLUCOSE 96 05/29/2019   CHOL 214 (H) 05/29/2019   TRIG 70.0 05/29/2019   HDL 70.90 05/29/2019   LDLDIRECT 121.4 12/02/2010   LDLCALC 129 (H) 05/29/2019   ALT 15 05/29/2019   AST 18 05/29/2019   NA 139  05/29/2019   K 4.2 05/29/2019   CL 105 05/29/2019   CREATININE 1.12 05/29/2019   BUN 22 05/29/2019   CO2 26 05/29/2019   TSH 1.43 05/29/2019   HGBA1C 5.9 05/29/2019         Assessment & Plan:

## 2019-11-30 NOTE — Assessment & Plan Note (Signed)
stable overall by history and exam, recent data reviewed with pt, and pt to continue medical treatment as before,  to f/u any worsening symptoms or concerns  

## 2019-11-30 NOTE — Assessment & Plan Note (Signed)

## 2019-11-30 NOTE — Assessment & Plan Note (Addendum)
Mild to mod, for depomedrol IM 80,,  to f/u any worsening symptoms or concerns  In addition to the time spent performing CPE, I spent an additional 25 minutes face to face,in which greater than 50% of this time was spent in counseling and coordination of care for patient's acute illness as documented, including the differential dx, treatment, further evaluation and other management of low back pain, hyperglycemia, copd, and depression

## 2020-01-01 ENCOUNTER — Other Ambulatory Visit: Payer: Self-pay | Admitting: Internal Medicine

## 2020-01-01 NOTE — Telephone Encounter (Signed)
Done erx 

## 2020-01-29 DIAGNOSIS — Z743 Need for continuous supervision: Secondary | ICD-10-CM | POA: Diagnosis not present

## 2020-01-29 DIAGNOSIS — R42 Dizziness and giddiness: Secondary | ICD-10-CM | POA: Diagnosis not present

## 2020-01-29 DIAGNOSIS — R29810 Facial weakness: Secondary | ICD-10-CM | POA: Diagnosis not present

## 2020-01-29 DIAGNOSIS — I10 Essential (primary) hypertension: Secondary | ICD-10-CM | POA: Diagnosis not present

## 2020-01-30 ENCOUNTER — Encounter: Payer: Self-pay | Admitting: Internal Medicine

## 2020-01-30 ENCOUNTER — Other Ambulatory Visit: Payer: Self-pay

## 2020-01-30 ENCOUNTER — Ambulatory Visit (INDEPENDENT_AMBULATORY_CARE_PROVIDER_SITE_OTHER): Payer: BC Managed Care – PPO | Admitting: Internal Medicine

## 2020-01-30 ENCOUNTER — Other Ambulatory Visit: Payer: Self-pay | Admitting: Internal Medicine

## 2020-01-30 VITALS — BP 180/100 | HR 80 | Temp 98.2°F | Ht 64.5 in | Wt 123.0 lb

## 2020-01-30 DIAGNOSIS — J438 Other emphysema: Secondary | ICD-10-CM | POA: Diagnosis not present

## 2020-01-30 DIAGNOSIS — J309 Allergic rhinitis, unspecified: Secondary | ICD-10-CM

## 2020-01-30 DIAGNOSIS — R739 Hyperglycemia, unspecified: Secondary | ICD-10-CM | POA: Diagnosis not present

## 2020-01-30 DIAGNOSIS — I10 Essential (primary) hypertension: Secondary | ICD-10-CM | POA: Diagnosis not present

## 2020-01-30 MED ORDER — MECLIZINE HCL 12.5 MG PO TABS: 12.5000 mg | ORAL_TABLET | Freq: Three times a day (TID) | ORAL | 1 refills | Status: DC | PRN

## 2020-01-30 MED ORDER — METHYLPREDNISOLONE ACETATE 80 MG/ML IJ SUSP
80.0000 mg | Freq: Once | INTRAMUSCULAR | Status: AC
  Administered 2020-01-30: 80 mg via INTRAMUSCULAR

## 2020-01-30 MED ORDER — TRIAMCINOLONE ACETONIDE 55 MCG/ACT NA AERO: 2.0000 | INHALATION_SPRAY | Freq: Every day | NASAL | 12 refills | Status: DC

## 2020-01-30 MED ORDER — AMLODIPINE BESYLATE 5 MG PO TABS: 5.0000 mg | ORAL_TABLET | Freq: Every day | ORAL | 3 refills | Status: DC

## 2020-01-30 MED ORDER — CETIRIZINE HCL 10 MG PO TABS: 10.0000 mg | ORAL_TABLET | Freq: Every day | ORAL | 11 refills | Status: DC

## 2020-01-30 MED ORDER — PREDNISONE 10 MG PO TABS: ORAL_TABLET | ORAL | 0 refills | Status: DC

## 2020-01-30 NOTE — Progress Notes (Addendum)
Subjective:    Patient ID: Kathy Vargas, female    DOB: [birth year], 69 y.o.   MRN: [id number]  HPI  Here to f/u; overall doing ok,except Does have 3 days ongoing nasal allergy symptoms with clearish congestion, itch and sneezing, without fever, pain, ST, cough, swelling or wheezing. Did have right ear pressure and discomfort with vertigo pon bending at work.  EMS called. BP found elevated as well as today.    Pt denies chest pain, increasing sob or doe, wheezing, orthopnea, PND, increased LE swelling, palpitations, dizziness or syncope.  Pt denies new neurological symptoms such as new headache, or facial or extremity weakness or numbness.  Pt denies polydipsia, polyuria Past Medical History:  Diagnosis Date  . ABDOMINAL TENDERNESS 01/21/2011  . ACUTE GINGIVITIS NONPLAQUE INDUCED 06/11/2010  . ADD 09/05/2008  . Alcohol abuse, in remission 09/27/2007  . ALLERGIC RHINITIS 03/02/2009  . ANXIETY 07/02/2007  . BACK PAIN 05/19/2009  . CHRONIC OBSTRUCTIVE PULMONARY DISEASE, ACUTE EXACERBATION 08/06/2009  . COPD 07/02/2007  . DEGENERATIVE JOINT DISEASE, CERVICAL SPINE 07/02/2007  . DEPRESSION 07/02/2007  . GASTROENTERITIS, VIRAL 11/29/2010  . GERD 01/21/2011  . Headache(784.0) 07/02/2007  . HYPOTHYROIDISM 07/02/2007  . Lumbar disc disease   . Lumbar pain with radiation down right leg 06/01/2011  . OSTEOARTHRITIS 07/02/2007  . Other and unspecified ovarian cyst 05/19/2009  . PAIN, CHRONIC, DUE TO TRAUMA 07/02/2007  . PELVIC  PAIN 03/29/2010  . UTI 05/19/2009  . Vitamin D insufficiency    Past Surgical History:  Procedure Laterality Date  . ABDOMINAL HYSTERECTOMY    . CHOLECYSTECTOMY      reports that she has been smoking. She has never used smokeless tobacco. She reports that she does not drink alcohol or use drugs. family history includes Alcohol abuse in an other family member; Anxiety disorder in an other family member; Coronary artery disease in an other family member; Depression in an other family  member; Stroke in an other family member. Allergies  Allergen Reactions  . Morphine Nausea And Vomiting  . Hydrochlorothiazide Other (See Comments)    "sees spots"   Current Outpatient Medications on File Prior to Visit  Medication Sig Dispense Refill  . ALPRAZolam (XANAX) 0.5 MG tablet Take 1 tablet (0.5 mg total) by mouth 2 (two) times daily as needed. 60 tablet 5  . amphetamine-dextroamphetamine (ADDERALL) 30 MG tablet TAKE 1 TABLET BY MOUTH TWICE PER DAY 60 tablet 0  . Fluticasone-Salmeterol (ADVAIR) 250-50 MCG/DOSE AEPB Inhale 1 puff into the lungs 2 (two) times daily. 360 each 1  . hydrochlorothiazide (MICROZIDE) 12.5 MG capsule Take 1 capsule (12.5 mg total) by mouth daily. As needed 30 capsule 11  . albuterol (VENTOLIN HFA) 108 (90 Base) MCG/ACT inhaler Inhale 2 puffs into the lungs every 6 (six) hours as needed for wheezing or shortness of breath. 18 g 5  . estradiol (ESTRACE) 1 MG tablet TAKE ONE (1) TABLET BY MOUTH EVERY DAY (Patient not taking: Reported on 11/29/2019) 90 tablet 3  . ibuprofen (ADVIL,MOTRIN) 200 MG tablet Take 200 mg by mouth every 8 (eight) hours as needed for moderate pain.     . rosuvastatin (CRESTOR) 10 MG tablet Take 1 tablet (10 mg total) by mouth daily. (Patient not taking: Reported on 11/29/2019) 90 tablet 3  . [DISCONTINUED] FLUoxetine (PROZAC) 20 MG capsule Take 1 capsule (20 mg total) by mouth daily. 30 capsule 11   No current facility-administered medications on file prior to visit.   Review of Systems All otherwise  neg per pt     Objective:   Physical Exam BP (!) 180/100 (BP Location: Left Arm, Patient Position: Sitting)   Pulse 80   Temp 98.2 F (36.8 C)   Ht 5' 4.5" (1.638 m)   Wt 123 lb (55.8 kg)   SpO2 99%   BMI 20.79 kg/m  VS noted,  Constitutional: Pt appears in NAD HENT: Head: NCAT.  Right Ear: External ear normal.  Left Ear: External ear normal.  Bilat tm's with mild erythema.  Max sinus areasnon tender.  Pharynx with mild  erythema, no exudate Eyes: . Pupils are equal, round, and reactive to light. Conjunctivae and EOM are normal Nose: without d/c or deformity Neck: Neck supple. Gross normal ROM Cardiovascular: Normal rate and regular rhythm.   Pulmonary/Chest: Effort normal and breath sounds without rales or wheezing.  Abd:  Soft, NT, ND, + BS, no organomegaly Neurological: Pt is alert. At baseline orientation, motor grossly intact Skin: Skin is warm. No rashes, other new lesions, no LE edema Psychiatric: Pt behavior is normal without agitation  All otherwise neg per pt  Lab Results  Component Value Date   WBC 6.9 05/29/2019   HGB 13.1 05/29/2019   HCT 40.0 05/29/2019   PLT 322.0 05/29/2019   GLUCOSE 96 05/29/2019   CHOL 214 (H) 05/29/2019   TRIG 70.0 05/29/2019   HDL 70.90 05/29/2019   LDLDIRECT 121.4 12/02/2010   LDLCALC 129 (H) 05/29/2019   ALT 15 05/29/2019   AST 18 05/29/2019   NA 139 05/29/2019   K 4.2 05/29/2019   CL 105 05/29/2019   CREATININE 1.12 05/29/2019   BUN 22 05/29/2019   CO2 26 05/29/2019   TSH 1.43 05/29/2019   HGBA1C 5.9 05/29/2019      Assessment & Plan:

## 2020-01-30 NOTE — Assessment & Plan Note (Addendum)
With seasonal flare, resulting in right eustachian dysfxn, vertigo with nausea; for depomeddrol IM 80, predpac asd, restart zyrtec and nasacort, and mucinex prn  I spent 31 minutes in preparing to see the patient by review of recent labs, imaging and procedures, obtaining and reviewing separately obtained history, communicating with the patient and family or caregiver, ordering medications, tests or procedures, and documenting clinical information in the EHR including the differential Dx, treatment, and any further evaluation and other management of allergic rhinitis, copd, HTN, hyperglycemia

## 2020-01-30 NOTE — Assessment & Plan Note (Signed)
stable overall by history and exam, recent data reviewed with pt, and pt to continue medical treatment as before,  to f/u any worsening symptoms or concerns  

## 2020-01-30 NOTE — Patient Instructions (Signed)
You had the steroid shot today  Please take all new medication as prescribed - the prednisone, meclizine as needed for dizziness and nausea, and amlodipine 5 mg per day for blood pressure  Please continue all other medications as before, including restart the zyrtec and nasacort  OK to also take OTC Mucinex twice per day until the ear is improved  Please have the pharmacy call with any other refills you may need.  Please keep your appointments with your specialists as you may have planned  You are given the work note  Please call for ENT referral if not better in 3-5 days

## 2020-01-30 NOTE — Assessment & Plan Note (Signed)
Recent onset, for amlodipine 5 qd

## 2020-02-07 ENCOUNTER — Telehealth: Payer: Self-pay | Admitting: Internal Medicine

## 2020-02-07 NOTE — Telephone Encounter (Signed)
New message:   Pt is wanting to know if the can send her in a antibiotic to the pharmacy for her ear issues and same reason as of to why she came into the office last week.  [facility] at [facility], [place] - [address]

## 2020-02-07 NOTE — Telephone Encounter (Signed)
Sorry, there was no evidence of fever or other need for antibiotic at last visit  Remember not all ear pain is infection, and allergies that cause ears to be clogged and full can do this as well  Ok to continue current tx, and mucinex bid otc to help with congestion

## 2020-02-07 NOTE — Telephone Encounter (Signed)
I dont really have anything else to offer except to make sure she takes all her allergy meds as well including an antihistamine and nasal steroid

## 2020-02-10 NOTE — Telephone Encounter (Signed)
Spoke with patient.

## 2020-03-04 ENCOUNTER — Other Ambulatory Visit: Payer: Self-pay | Admitting: Internal Medicine

## 2020-03-04 NOTE — Telephone Encounter (Signed)
Check [place] registry last filled 01/02/2020. MD is out of the office this week pls advise on refill.Marland KitchenRaechel Chute

## 2020-03-07 ENCOUNTER — Ambulatory Visit: Payer: BC Managed Care – PPO | Attending: Internal Medicine

## 2020-03-07 DIAGNOSIS — Z23 Encounter for immunization: Secondary | ICD-10-CM

## 2020-03-07 NOTE — Progress Notes (Signed)
   Covid-19 Vaccination Clinic  Name:  Kathy Vargas    MRN: [id number] DOB: [birth year]  03/07/2020  Kathy Vargas was observed post Covid-19 immunization for 15 minutes without incident. She was provided with Vaccine Information Sheet and instruction to access the V-Safe system.   Kathy Vargas was instructed to call 911 with any severe reactions post vaccine: Marland Kitchen Difficulty breathing  . Swelling of face and throat  . A fast heartbeat  . A bad rash all over body  . Dizziness and weakness   Immunizations Administered    Name Date Dose VIS Date Route   Pfizer COVID-19 Vaccine 03/07/2020 11:15 AM 0.3 mL 11/01/2019 Intramuscular   Manufacturer: [employer], [employer]   Lot: [id number]   NDC: [address]-2

## 2020-03-30 ENCOUNTER — Ambulatory Visit: Payer: BC Managed Care – PPO

## 2020-04-15 ENCOUNTER — Other Ambulatory Visit: Payer: Self-pay | Admitting: Internal Medicine

## 2020-05-28 ENCOUNTER — Ambulatory Visit (INDEPENDENT_AMBULATORY_CARE_PROVIDER_SITE_OTHER): Payer: BC Managed Care – PPO | Admitting: Internal Medicine

## 2020-05-28 ENCOUNTER — Other Ambulatory Visit: Payer: Self-pay

## 2020-05-28 ENCOUNTER — Encounter: Payer: Self-pay | Admitting: Internal Medicine

## 2020-05-28 DIAGNOSIS — J441 Chronic obstructive pulmonary disease with (acute) exacerbation: Secondary | ICD-10-CM

## 2020-05-28 DIAGNOSIS — I10 Essential (primary) hypertension: Secondary | ICD-10-CM

## 2020-05-28 DIAGNOSIS — J309 Allergic rhinitis, unspecified: Secondary | ICD-10-CM | POA: Diagnosis not present

## 2020-05-28 DIAGNOSIS — R739 Hyperglycemia, unspecified: Secondary | ICD-10-CM | POA: Diagnosis not present

## 2020-05-28 MED ORDER — ALPRAZOLAM 0.5 MG PO TABS: 0.5000 mg | ORAL_TABLET | Freq: Two times a day (BID) | ORAL | 5 refills | Status: DC | PRN

## 2020-05-28 MED ORDER — PREDNISONE 10 MG PO TABS
ORAL_TABLET | ORAL | 0 refills | Status: DC
Start: 2020-05-28 — End: 2020-06-18

## 2020-05-28 MED ORDER — FLUTICASONE-SALMETEROL 250-50 MCG/DOSE IN AEPB: 1.0000 | INHALATION_SPRAY | Freq: Two times a day (BID) | RESPIRATORY_TRACT | 3 refills | Status: DC

## 2020-05-28 MED ORDER — AMPHETAMINE-DEXTROAMPHETAMINE 30 MG PO TABS: ORAL_TABLET | ORAL | 0 refills | Status: DC

## 2020-05-28 MED ORDER — AMLODIPINE BESYLATE 5 MG PO TABS: 5.0000 mg | ORAL_TABLET | Freq: Every day | ORAL | 3 refills | Status: DC

## 2020-05-28 NOTE — Assessment & Plan Note (Signed)
stable overall by history and exam, recent data reviewed with pt, and pt to continue medical treatment as before,  to f/u any worsening symptoms or concerns  

## 2020-05-28 NOTE — Assessment & Plan Note (Addendum)
Mild, for predpac asd,  to f/u any worsening symptoms or concerns  I spent 31 minutes in preparing to see the patient by review of recent labs, imaging and procedures, obtaining and reviewing separately obtained history, communicating with the patient and family or caregiver, ordering medications, tests or procedures, and documenting clinical information in the EHR including the differential Dx, treatment, and any further evaluation and other management of copd exacerbation, allergy, htn, hyperglcycemia

## 2020-05-28 NOTE — Patient Instructions (Addendum)
Please take all new medication as prescribed - the prednisone  Please continue all other medications as before, and refills have been done if requested.  Please have the pharmacy call with any other refills you may need.  Please continue your efforts at being more active, low cholesterol diet, and weight control.  Please keep your appointments with your specialists as you may have planned  Please make an Appointment to return in 6 months, or sooner if needed 

## 2020-05-28 NOTE — Assessment & Plan Note (Signed)
Also for predpac asd, . to f/u any worsening symptoms or concerns 

## 2020-05-28 NOTE — Progress Notes (Signed)
Subjective:    Patient ID: Kathy Vargas, female    DOB: Jun 11, 1951, 69 y.o.   MRN: [id number]  HPI   Here to f/u; overall doing ok,  Pt denies chest pain, increasing sob or doe, wheezing, orthopnea, PND, increased LE swelling, palpitations, dizziness or syncope.  Pt denies new neurological symptoms such as new headache, or facial or extremity weakness or numbness.  Pt denies polydipsia, polyuria, or low sugar episode.  Pt states overall good compliance with meds, mostly trying to follow appropriate diet, with wt overall stable,  but little exercise however.  S/p recent facelift.  Recent vertigo and elevated BP resolved.  Does have several wks ongoing nasal allergy symptoms with clearish congestion, itch and sneezing, without fever, pain, ST, cough, swelling, but also has mild intermittent wheezing, mild worse with sob in last 2 days.  Still smoking, not ready to quit Past Medical History:  Diagnosis Date  . ABDOMINAL TENDERNESS 01/21/2011  . ACUTE GINGIVITIS NONPLAQUE INDUCED 06/11/2010  . ADD 09/05/2008  . Alcohol abuse, in remission 09/27/2007  . ALLERGIC RHINITIS 03/02/2009  . ANXIETY 07/02/2007  . BACK PAIN 05/19/2009  . CHRONIC OBSTRUCTIVE PULMONARY DISEASE, ACUTE EXACERBATION 08/06/2009  . COPD 07/02/2007  . DEGENERATIVE JOINT DISEASE, CERVICAL SPINE 07/02/2007  . DEPRESSION 07/02/2007  . GASTROENTERITIS, VIRAL 11/29/2010  . GERD 01/21/2011  . Headache(784.0) 07/02/2007  . HYPOTHYROIDISM 07/02/2007  . Lumbar disc disease   . Lumbar pain with radiation down right leg 06/01/2011  . OSTEOARTHRITIS 07/02/2007  . Other and unspecified ovarian cyst 05/19/2009  . PAIN, CHRONIC, DUE TO TRAUMA 07/02/2007  . PELVIC  PAIN 03/29/2010  . UTI 05/19/2009  . Vitamin D insufficiency    Past Surgical History:  Procedure Laterality Date  . ABDOMINAL HYSTERECTOMY    . CHOLECYSTECTOMY      reports that she has been smoking. She has never used smokeless tobacco. She reports that she does not drink alcohol and does  not use drugs. family history includes Alcohol abuse in an other family member; Anxiety disorder in an other family member; Coronary artery disease in an other family member; Depression in an other family member; Stroke in an other family member. Allergies  Allergen Reactions  . Morphine Nausea And Vomiting  . Hydrochlorothiazide Other (See Comments)    "sees spots"   Current Outpatient Medications on File Prior to Visit  Medication Sig Dispense Refill  . oxyCODONE-acetaminophen (PERCOCET/ROXICET) 5-325 MG tablet Take by mouth.    . [DISCONTINUED] FLUoxetine (PROZAC) 20 MG capsule Take 1 capsule (20 mg total) by mouth daily. 30 capsule 11   No current facility-administered medications on file prior to visit.   Review of Systems All otherwise neg per pt     Objective:   Physical Exam BP 120/80 (BP Location: Left Arm, Patient Position: Sitting, Cuff Size: Large)   Pulse 71   Temp 98.7 F (37.1 C) (Oral)   Ht 5' 4.5" (1.638 m)   Wt 129 lb (58.5 kg)   SpO2 97%   BMI 21.80 kg/m  VS noted,  Constitutional: Pt appears in NAD HENT: Head: NCAT.  Right Ear: External ear normal.  Left Ear: External ear normal.  Eyes: . Pupils are equal, round, and reactive to light. Conjunctivae and EOM are normal Bilat tm's with mild erythema.  Max sinus areas non tender.  Pharynx with mild erythema, no exudate Nose: without d/c or deformity Neck: Neck supple. Gross normal ROM Cardiovascular: Normal rate and regular rhythm.   Pulmonary/Chest: Effort normal  and breath sounds without rales but with few trace bilat wheezing.  Abd:  Soft, NT, ND, + BS, no organomegaly Neurological: Pt is alert. At baseline orientation, motor grossly intact Skin: Skin is warm. No rashes, other new lesions, no LE edema Psychiatric: Pt behavior is normal without agitation  All otherwise neg per pt Lab Results  Component Value Date   WBC 6.9 05/29/2019   HGB 13.1 05/29/2019   HCT 40.0 05/29/2019   PLT 322.0  05/29/2019   GLUCOSE 96 05/29/2019   CHOL 214 (H) 05/29/2019   TRIG 70.0 05/29/2019   HDL 70.90 05/29/2019   LDLDIRECT 121.4 12/02/2010   LDLCALC 129 (H) 05/29/2019   ALT 15 05/29/2019   AST 18 05/29/2019   NA 139 05/29/2019   K 4.2 05/29/2019   CL 105 05/29/2019   CREATININE 1.12 05/29/2019   BUN 22 05/29/2019   CO2 26 05/29/2019   TSH 1.43 05/29/2019   HGBA1C 5.9 05/29/2019      Assessment & Plan:

## 2020-06-18 ENCOUNTER — Telehealth: Payer: Self-pay | Admitting: Internal Medicine

## 2020-06-18 DIAGNOSIS — R42 Dizziness and giddiness: Secondary | ICD-10-CM

## 2020-06-18 MED ORDER — PREDNISONE 10 MG PO TABS
ORAL_TABLET | ORAL | 0 refills | Status: DC
Start: 2020-06-18 — End: 2020-07-01

## 2020-06-18 NOTE — Telephone Encounter (Signed)
Sent to Dr. John. 

## 2020-06-18 NOTE — Telephone Encounter (Signed)
    Patient calling to report dizziness and nausea. Patient states she has felt bad since last ov.  Requesting additional Prednisone Request referral to ENT

## 2020-06-18 NOTE — Telephone Encounter (Signed)
Ok referral ENT  Also ok for prednisone - done erx

## 2020-06-22 NOTE — Telephone Encounter (Signed)
LDVM for pt of Dr. John's note. °

## 2020-06-30 ENCOUNTER — Other Ambulatory Visit: Payer: Self-pay

## 2020-06-30 ENCOUNTER — Ambulatory Visit (INDEPENDENT_AMBULATORY_CARE_PROVIDER_SITE_OTHER): Payer: PPO | Admitting: Internal Medicine

## 2020-06-30 ENCOUNTER — Encounter: Payer: Self-pay | Admitting: Internal Medicine

## 2020-06-30 VITALS — BP 126/82 | HR 106 | Temp 98.3°F | Ht 64.5 in | Wt 122.0 lb

## 2020-06-30 DIAGNOSIS — R0789 Other chest pain: Secondary | ICD-10-CM | POA: Diagnosis not present

## 2020-06-30 DIAGNOSIS — H6691 Otitis media, unspecified, right ear: Secondary | ICD-10-CM | POA: Diagnosis not present

## 2020-06-30 MED ORDER — AMOXICILLIN-POT CLAVULANATE 875-125 MG PO TABS: 1.0000 | ORAL_TABLET | Freq: Two times a day (BID) | ORAL | 0 refills | Status: DC

## 2020-06-30 MED ORDER — PANTOPRAZOLE SODIUM 40 MG PO TBEC
40.0000 mg | DELAYED_RELEASE_TABLET | Freq: Every day | ORAL | 3 refills | Status: DC
Start: 2020-06-30 — End: 2020-08-18

## 2020-06-30 MED ORDER — CEFTRIAXONE SODIUM 250 MG IJ SOLR
250.0000 mg | Freq: Once | INTRAMUSCULAR | Status: AC
  Administered 2020-06-30: 250 mg via INTRAMUSCULAR

## 2020-06-30 NOTE — Progress Notes (Signed)
Subjective:   Patient ID: Kathy Vargas, female    DOB: [birth year], 70 y.o.   MRN: [id number]  HPI The patient is a 69 YO female coming in for concerns about her ears. Referral placed by PCP to ENT end of July and she has not seen them yet. She was treated with prednisone for possible COPD flare beginning of July and then given more prednisone 9 day course at the end of July. These have helped with her symptoms some but not fully and the symptoms came right back after stopping prednsone. Having some ear pain currently and some echoing sound. Denies hearing loss. Overall not improving. Additional concern is stomach pain (at the top of the stomach/low chest, started a week or two ago, worse with bending or twisting, still working and this is keeping her from working some, denies nausea or vomiting, denies diarrhea, some constipation, eating and drinking normally, is taking tums/maalox otc and this helps with the pain for about 10-30 minutes, is a current smoker, denies pain changes with walking up/down stairs, denies SOB, chronic cough).    Review of Systems  Constitutional: Negative.   HENT: Positive for ear pain and tinnitus.   Eyes: Negative.   Respiratory: Negative for cough, chest tightness and shortness of breath.   Cardiovascular: Positive for chest pain. Negative for palpitations and leg swelling.  Gastrointestinal: Positive for abdominal pain. Negative for abdominal distention, constipation, diarrhea, nausea and vomiting.  Musculoskeletal: Negative.   Skin: Negative.   Neurological: Negative.   Psychiatric/Behavioral: Negative.     Objective:  Physical Exam Constitutional:      Appearance: She is well-developed.  HENT:     Head: Normocephalic and atraumatic.     Right Ear: Ear canal and external ear normal. There is no impacted cerumen.     Left Ear: Tympanic membrane and ear canal normal. There is no impacted cerumen.     Ears:     Comments: Right TM with bulging with cloudy  fluid and appears to have chronic changes Cardiovascular:     Rate and Rhythm: Normal rate and regular rhythm.  Pulmonary:     Effort: Pulmonary effort is normal. No respiratory distress.     Breath sounds: Normal breath sounds. No wheezing or rales.  Abdominal:     General: Bowel sounds are normal. There is no distension.     Palpations: Abdomen is soft.     Tenderness: There is no abdominal tenderness. There is no rebound.  Musculoskeletal:     Cervical back: Normal range of motion.  Skin:    General: Skin is warm and dry.  Neurological:     Mental Status: She is alert and oriented to person, place, and time.     Coordination: Coordination normal.     Vitals:   06/30/20 0758  BP: 126/82  Pulse: (!) 106  Temp: 98.3 F (36.8 C)  TempSrc: Oral  SpO2: 98%  Weight: 122 lb (55.3 kg)  Height: 5' 4.5" (1.638 m)   EKG: Rate 100, axis normal, intervals normal, sinus, no st or t wave changes, no prior to compare  This visit occurred during the SARS-CoV-2 public health emergency.  Safety protocols were in place, including screening questions prior to the visit, additional usage of staff PPE, and extensive cleaning of exam room while observing appropriate contact time as indicated for disinfecting solutions.   Assessment & Plan:  Ceftriaxone 250 mg IM given at visit  Visit time 35 minutes in face to face communication  with patient and coordination of care, additional 5 minutes spent in record review, coordination or care, ordering tests, communicating/referring to other healthcare professionals, documenting in medical records all on the same day of the visit for total time 40 minutes spent on the visit.

## 2020-06-30 NOTE — Patient Instructions (Addendum)
We have sent in augmentin to take for the ear 1 pill twice a day for 10 days starting tomorrow.  We have sent in a stomach medicine called protonix to take daily for the next 1-2 weeks for the stomach pain.

## 2020-07-01 DIAGNOSIS — H6691 Otitis media, unspecified, right ear: Secondary | ICD-10-CM | POA: Insufficient documentation

## 2020-07-01 DIAGNOSIS — R0789 Other chest pain: Secondary | ICD-10-CM | POA: Insufficient documentation

## 2020-07-01 NOTE — Assessment & Plan Note (Signed)
Given high risk for CAD with smoking, adderall usage, age EKG checked and no changed from prior. Checking CBC, CMP, lipase, troponin. Rx protonix since tums seems to help to rule out GERD as cause. Description fits more with muscular or possible related to constipation some.

## 2020-07-01 NOTE — Assessment & Plan Note (Signed)
Given ceftriaxone IM 250 mg here and rx augmentin 10 day course to get her to ENT visit. She was asking for further prednisone but do not feel that is appropriate to her condition.

## 2020-07-13 ENCOUNTER — Other Ambulatory Visit: Payer: Self-pay

## 2020-07-13 ENCOUNTER — Encounter (INDEPENDENT_AMBULATORY_CARE_PROVIDER_SITE_OTHER): Payer: Self-pay | Admitting: Otolaryngology

## 2020-07-13 ENCOUNTER — Ambulatory Visit (INDEPENDENT_AMBULATORY_CARE_PROVIDER_SITE_OTHER): Payer: PPO | Admitting: Otolaryngology

## 2020-07-13 VITALS — Temp 98.1°F

## 2020-07-13 DIAGNOSIS — H6981 Other specified disorders of Eustachian tube, right ear: Secondary | ICD-10-CM

## 2020-07-13 DIAGNOSIS — J310 Chronic rhinitis: Secondary | ICD-10-CM

## 2020-07-13 DIAGNOSIS — H6123 Impacted cerumen, bilateral: Secondary | ICD-10-CM | POA: Diagnosis not present

## 2020-07-13 DIAGNOSIS — R42 Dizziness and giddiness: Secondary | ICD-10-CM | POA: Diagnosis not present

## 2020-07-13 NOTE — Progress Notes (Signed)
HPI: Kathy Vargas is a 69 y.o. female who presents is referred by Dr. Jonny Ruiz for evaluation of right ear symptoms for over a year.  She states that her right ear hurts frequently and that she hears bubbling or popping in the ear.  She also describes episodes of dizziness and vertigo over the past year that has been on and off and she thinks it is related to the right ear.  She has been told that she has fluid in the right ear. When she has dizziness she generally has headaches. 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 She describes vertigo happening occasionally when she bends over but not on a regular basis..  Past Medical History:  Diagnosis Date   ABDOMINAL TENDERNESS 01/21/2011   ACUTE GINGIVITIS NONPLAQUE INDUCED 06/11/2010   ADD 09/05/2008   Alcohol abuse, in remission 09/27/2007   ALLERGIC RHINITIS 03/02/2009   ANXIETY 07/02/2007   BACK PAIN 05/19/2009   CHRONIC OBSTRUCTIVE PULMONARY DISEASE, ACUTE EXACERBATION 08/06/2009   COPD 07/02/2007   DEGENERATIVE JOINT DISEASE, CERVICAL SPINE 07/02/2007   DEPRESSION 07/02/2007   GASTROENTERITIS, VIRAL 11/29/2010   GERD 01/21/2011   Headache(784.0) 07/02/2007   HYPOTHYROIDISM 07/02/2007   Lumbar disc disease    Lumbar pain with radiation down right leg 06/01/2011   OSTEOARTHRITIS 07/02/2007   Other and unspecified ovarian cyst 05/19/2009   PAIN, CHRONIC, DUE TO TRAUMA 07/02/2007   PELVIC  PAIN 03/29/2010   UTI 05/19/2009   Vitamin D insufficiency    Past Surgical History:  Procedure Laterality Date   ABDOMINAL HYSTERECTOMY     CHOLECYSTECTOMY     Social History   Socioeconomic History   Marital status: Divorced    Spouse name: Not on file   Number of children: Not on file   Years of education: Not on file   Highest education level: Not on file  Occupational History   Not on file  Tobacco Use   Smoking status: Current Every  Day Smoker    Packs/day: 0.50    Years: 56.00    Pack years: 28.00    Start date: 1964   Smokeless tobacco: Never Used  Substance and Sexual Activity   Alcohol use: No   Drug use: No   Sexual activity: Not on file  Other Topics Concern   Not on file  Social History Narrative   Not on file   Social Determinants of Health   Financial Resource Strain:    Difficulty of Paying Living Expenses: Not on file  Food Insecurity:    Worried About Programme researcher, broadcasting/film/video in the Last Year: Not on file   [employer] of Food in the Last Year: Not on file  Transportation Needs:    Lack of Transportation (Medical): Not on file   Lack of Transportation (Non-Medical): Not on file  Physical Activity:    Days of Exercise per Week: Not on file   Minutes of Exercise per Session: Not on file  Stress:    Feeling of Stress : Not on file  Social Connections:    Frequency of Communication with Friends and Family: Not on file   Frequency of Social Gatherings with Friends and Family: Not on file   Attends Religious Services: Not on file   Active Member of Clubs or Organizations: Not on file   Attends Banker Meetings: Not on file   Marital Status:  Not on file   Family History  Problem Relation Age of Onset   Alcohol abuse Other    Anxiety disorder Other    Coronary artery disease Other    Depression Other    Stroke Other    Allergies  Allergen Reactions   Morphine Nausea And Vomiting   Hydrochlorothiazide Other (See Comments)    "sees spots"   Prior to Admission medications   Medication Sig Start Date End Date Taking? Authorizing Provider  ALPRAZolam Prudy Feeler) 0.5 MG tablet Take 1 tablet (0.5 mg total) by mouth 2 (two) times daily as needed. 05/28/20  Yes Corwin Levins, MD  amLODipine (NORVASC) 5 MG tablet Take 1 tablet (5 mg total) by mouth daily. 05/28/20 05/28/21 Yes Corwin Levins, MD  amoxicillin-clavulanate (AUGMENTIN) 875-125 MG tablet Take 1 tablet by mouth 2  (two) times daily. 06/30/20  Yes Myrlene Broker, MD  amphetamine-dextroamphetamine (ADDERALL) 30 MG tablet 1 tab by mouth twice per day 05/28/20  Yes Corwin Levins, MD  Fluticasone-Salmeterol (ADVAIR) 250-50 MCG/DOSE AEPB Inhale 1 puff into the lungs 2 (two) times daily. 05/28/20  Yes Corwin Levins, MD  oxyCODONE-acetaminophen (PERCOCET/ROXICET) 5-325 MG tablet Take by mouth. 05/20/20  Yes [provider]  pantoprazole (PROTONIX) 40 MG tablet Take 1 tablet (40 mg total) by mouth daily. 06/30/20  Yes Myrlene Broker, MD  FLUoxetine (PROZAC) 20 MG capsule Take 1 capsule (20 mg total) by mouth daily. 09/07/11 02/09/12  Corwin Levins, MD     Positive ROS: Otherwise negative  All other systems have been reviewed and were otherwise negative with the exception of those mentioned in the HPI and as above.  Physical Exam: Constitutional: Alert, well-appearing, no acute distress Ears: External ears without lesions or tenderness.  She had wax buildup in both ear canals but this was nonobstructing and was removed with forceps and curettes.  TMs were clear bilaterally with good mobility on pneumatic otoscopy and no middle ear effusion or middle ear abnormalities noted.  Dix-Hallpike testing revealed no evidence of benign positional vertigo.  On hearing screening with the 512 and 1024 tuning fork her hearing was reasonably good in both ears and was symmetric.  Weber was midline and AC was greater than BC bilaterally. Nasal: External nose without lesions. Septum midline with mild rhinitis and clear mucus discharge. Clear nasal passages with no signs of infection. Oral: Lips and gums without lesions. Tongue and palate mucosa without lesions. Posterior oropharynx clear. Neck: No palpable adenopathy or masses Respiratory: Breathing comfortably  Skin: No facial/neck lesions or rash noted.  Cerumen impaction removal  Date/Time: 07/13/2020 5:38 PM Performed by: Drema Halon, MD Authorized  by: Drema Halon, MD   Consent:    Consent obtained:  Verbal   Consent given by:  Patient   Risks discussed:  Pain and bleeding Procedure details:    Location:  L ear and R ear   Procedure type: curette and forceps   Post-procedure details:    Inspection:  TM intact and canal normal   Hearing quality:  Improved   Patient tolerance of procedure:  Tolerated well, no immediate complications Comments:     TMs are clear bilaterally with good mobility pneumatic otoscopy.    Assessment: Reviewed with her that she has a normal TM and ear examination in the office today with no middle ear effusion or fluid.  Hearing was symmetric and there was no clinical evidence of BPPV. She does have mild rhinitis and perhaps some  eustachian tube dysfunction.  Plan: Recommended regular use of Flonase and gave her a prescription for Flonase 2 sprays each nostril at night as she is uses some in the past. Recommended follow-up here if she develops any hearing problems in the right ear or sensation of fluid in the right ear or pain in the right ear.  If she follows up we will plan on scheduling audiologic testing but on screening with a tuning forks today her hearing seems reasonably good and symmetric in both ears.   Narda Bonds, MD   CC:

## 2020-07-14 DIAGNOSIS — Z20822 Contact with and (suspected) exposure to covid-19: Secondary | ICD-10-CM | POA: Diagnosis not present

## 2020-07-14 DIAGNOSIS — J069 Acute upper respiratory infection, unspecified: Secondary | ICD-10-CM | POA: Diagnosis not present

## 2020-07-14 DIAGNOSIS — R05 Cough: Secondary | ICD-10-CM | POA: Diagnosis not present

## 2020-07-14 DIAGNOSIS — R509 Fever, unspecified: Secondary | ICD-10-CM | POA: Diagnosis not present

## 2020-07-15 ENCOUNTER — Ambulatory Visit: Payer: PPO | Admitting: Family

## 2020-07-24 ENCOUNTER — Other Ambulatory Visit: Payer: Self-pay | Admitting: Internal Medicine

## 2020-07-24 NOTE — Telephone Encounter (Signed)
Done erx 

## 2020-08-07 ENCOUNTER — Other Ambulatory Visit: Payer: Self-pay

## 2020-08-07 ENCOUNTER — Emergency Department (HOSPITAL_BASED_OUTPATIENT_CLINIC_OR_DEPARTMENT_OTHER)
Admission: EM | Admit: 2020-08-07 | Discharge: 2020-08-07 | Disposition: A | Discharge status: Home / Self Care | Payer: PPO | Attending: Emergency Medicine | Admitting: Emergency Medicine

## 2020-08-07 ENCOUNTER — Emergency Department (HOSPITAL_BASED_OUTPATIENT_CLINIC_OR_DEPARTMENT_OTHER): Payer: PPO

## 2020-08-07 DIAGNOSIS — R531 Weakness: Secondary | ICD-10-CM | POA: Diagnosis not present

## 2020-08-07 DIAGNOSIS — E039 Hypothyroidism, unspecified: Secondary | ICD-10-CM | POA: Diagnosis not present

## 2020-08-07 DIAGNOSIS — Z79899 Other long term (current) drug therapy: Secondary | ICD-10-CM | POA: Insufficient documentation

## 2020-08-07 DIAGNOSIS — Z7951 Long term (current) use of inhaled steroids: Secondary | ICD-10-CM | POA: Diagnosis not present

## 2020-08-07 DIAGNOSIS — R5383 Other fatigue: Secondary | ICD-10-CM | POA: Insufficient documentation

## 2020-08-07 DIAGNOSIS — I10 Essential (primary) hypertension: Secondary | ICD-10-CM | POA: Diagnosis not present

## 2020-08-07 DIAGNOSIS — F17200 Nicotine dependence, unspecified, uncomplicated: Secondary | ICD-10-CM | POA: Insufficient documentation

## 2020-08-07 DIAGNOSIS — N390 Urinary tract infection, site not specified: Secondary | ICD-10-CM | POA: Diagnosis not present

## 2020-08-07 DIAGNOSIS — J441 Chronic obstructive pulmonary disease with (acute) exacerbation: Secondary | ICD-10-CM | POA: Diagnosis not present

## 2020-08-07 LAB — CBC WITH DIFFERENTIAL/PLATELET
Abs Immature Granulocytes: 0.05 10*3/uL (ref 0.00–0.07)
Basophils Absolute: 0 10*3/uL (ref 0.0–0.1)
Basophils Relative: 0 %
Eosinophils Absolute: 0.3 10*3/uL (ref 0.0–0.5)
Eosinophils Relative: 3 %
HCT: 32.3 % — ABNORMAL LOW (ref 36.0–46.0)
Hemoglobin: 10.5 g/dL — ABNORMAL LOW (ref 12.0–15.0)
Immature Granulocytes: 1 %
Lymphocytes Relative: 8 %
Lymphs Abs: 0.7 10*3/uL (ref 0.7–4.0)
MCH: 28.1 pg (ref 26.0–34.0)
MCHC: 32.5 g/dL (ref 30.0–36.0)
MCV: 86.4 fL (ref 80.0–100.0)
Monocytes Absolute: 1 10*3/uL (ref 0.1–1.0)
Monocytes Relative: 12 %
Neutro Abs: 6.4 10*3/uL (ref 1.7–7.7)
Neutrophils Relative %: 76 %
Platelets: 270 10*3/uL (ref 150–400)
RBC: 3.74 MIL/uL — ABNORMAL LOW (ref 3.87–5.11)
RDW: 15 % (ref 11.5–15.5)
WBC: 8.4 10*3/uL (ref 4.0–10.5)
nRBC: 0 % (ref 0.0–0.2)

## 2020-08-07 LAB — URINALYSIS, ROUTINE W REFLEX MICROSCOPIC
Bilirubin Urine: NEGATIVE
Glucose, UA: NEGATIVE mg/dL
Ketones, ur: NEGATIVE mg/dL
Nitrite: POSITIVE — AB
Protein, ur: 30 mg/dL — AB
Specific Gravity, Urine: 1.02 (ref 1.005–1.030)
pH: 6 (ref 5.0–8.0)

## 2020-08-07 LAB — BASIC METABOLIC PANEL
Anion gap: 11 (ref 5–15)
BUN: 27 mg/dL — ABNORMAL HIGH (ref 8–23)
CO2: 21 mmol/L — ABNORMAL LOW (ref 22–32)
Calcium: 8.6 mg/dL — ABNORMAL LOW (ref 8.9–10.3)
Chloride: 103 mmol/L (ref 98–111)
Creatinine, Ser: 1.5 mg/dL — ABNORMAL HIGH (ref 0.44–1.00)
GFR calc Af Amer: 41 mL/min — ABNORMAL LOW (ref >60)
GFR calc non Af Amer: 35 mL/min — ABNORMAL LOW (ref >60)
Glucose, Bld: 117 mg/dL — ABNORMAL HIGH (ref 70–99)
Potassium: 3 mmol/L — ABNORMAL LOW (ref 3.5–5.1)
Sodium: 135 mmol/L (ref 135–145)

## 2020-08-07 LAB — URINALYSIS, MICROSCOPIC (REFLEX): WBC, UA: >50 WBC/hpf (ref 0–5)

## 2020-08-07 MED ORDER — IBUPROFEN 400 MG PO TABS
400.0000 mg | ORAL_TABLET | Freq: Once | ORAL | Status: AC
  Administered 2020-08-07: 400 mg via ORAL
  Filled 2020-08-07: qty 1

## 2020-08-07 MED ORDER — CEPHALEXIN 500 MG PO CAPS: 500.0000 mg | ORAL_CAPSULE | Freq: Two times a day (BID) | ORAL | 0 refills | Status: AC

## 2020-08-07 MED ORDER — CEPHALEXIN 250 MG PO CAPS
500.0000 mg | ORAL_CAPSULE | Freq: Once | ORAL | Status: AC
  Administered 2020-08-07: 500 mg via ORAL
  Filled 2020-08-07: qty 2

## 2020-08-07 MED FILL — CEPHALEXIN 500 MG CAPSULE: 500 | 5 days supply | Qty: 10 | Fill #0

## 2020-08-07 NOTE — ED Notes (Signed)
Pt requested pain meds and  Motrin before she goes

## 2020-08-07 NOTE — ED Triage Notes (Signed)
Pt states that she has had an ear infection for a few months, has been treating it and going to PCP, reports she "is just sick and doesn't know why".

## 2020-08-07 NOTE — ED Triage Notes (Signed)
Pt arrives mildly anxious in appearance, stating has been seen for generalized weakness and concerns over respiratory issues, without resolution from pmd, recently finished abt, steroid therapy, states having some urinary issues, pain with urination. MD at bedside

## 2020-08-07 NOTE — ED Provider Notes (Signed)
[facility] EMERGENCY DEPARTMENT Provider Note   CSN: [id number] Arrival date & time: 08/07/20  [id number]     History Chief Complaint  Patient presents with  . Weakness  . Urinary Tract Infection    Kathy HaberChristine Vargas is a 69 y.o. female.  Patient with history of anxiety, depression who presents to the ED with multiple complaints.  Overall concerned about generalized weakness.  Concern for possible urinary tract infection.  Denies any chest pain, shortness of breath, abdominal pain.  The history is provided by the patient.  Illness Location:  General Severity:  Mild Onset quality:  Gradual Timing:  Intermittent Progression:  Waxing and waning Relieved by:  Nothing Worsened by:  Nothing Associated symptoms: fatigue   Associated symptoms: no abdominal pain, no chest pain, no cough, no ear pain, no fever, no rash, no rhinorrhea, no shortness of breath, no sore throat and no vomiting        Past Medical History:  Diagnosis Date  . ABDOMINAL TENDERNESS 01/21/2011  . ACUTE GINGIVITIS NONPLAQUE INDUCED 06/11/2010  . ADD 09/05/2008  . Alcohol abuse, in remission 09/27/2007  . ALLERGIC RHINITIS 03/02/2009  . ANXIETY 07/02/2007  . BACK PAIN 05/19/2009  . CHRONIC OBSTRUCTIVE PULMONARY DISEASE, ACUTE EXACERBATION 08/06/2009  . COPD 07/02/2007  . DEGENERATIVE JOINT DISEASE, CERVICAL SPINE 07/02/2007  . DEPRESSION 07/02/2007  . GASTROENTERITIS, VIRAL 11/29/2010  . GERD 01/21/2011  . Headache(784.0) 07/02/2007  . HYPOTHYROIDISM 07/02/2007  . Lumbar disc disease   . Lumbar pain with radiation down right leg 06/01/2011  . OSTEOARTHRITIS 07/02/2007  . Other and unspecified ovarian cyst 05/19/2009  . PAIN, CHRONIC, DUE TO TRAUMA 07/02/2007  . PELVIC  PAIN 03/29/2010  . UTI 05/19/2009  . Vitamin D insufficiency     Patient Active Problem List   Diagnosis Date Noted  . Other chest pain 07/01/2020  . Right otitis media 07/01/2020  . Hypertension 01/30/2020  . Acute URI 02/04/2019  .  Hyponatremia 12/12/2017  . Fever 08/15/2017  . Left low back pain 05/30/2017  . Gross hematuria 06/14/2016  . Hyperlipidemia 05/06/2016  . Hyperglycemia 05/06/2016  . Venous (peripheral) insufficiency 05/06/2016  . COPD exacerbation (HCC) 04/15/2015  . Insomnia 04/15/2015  . Fatigue 10/15/2014  . Smoker 10/15/2014  . Chronic pain syndrome 02/21/2014  . Cough 12/25/2013  . Angioedema 09/24/2013  . Menopausal problem 02/09/2012  . Lumbar pain with radiation down right leg 06/01/2011  . Encounter for well adult exam with abnormal findings 05/29/2011  . GERD 01/21/2011  . Vitamin D deficiency 11/29/2010  . ACUTE GINGIVITIS NONPLAQUE INDUCED 06/11/2010  . PELVIC  PAIN 03/29/2010  . DISC DISEASE, LUMBAR 08/06/2009  . UTI (urinary tract infection) 05/19/2009  . Other and unspecified ovarian cyst 05/19/2009  . BACK PAIN 05/19/2009  . Allergic rhinitis 03/02/2009  . Attention deficit disorder 09/05/2008  . Alcohol abuse, in remission 09/27/2007  . Hypothyroidism 07/02/2007  . Anxiety state 07/02/2007  . Depression 07/02/2007  . PAIN, CHRONIC, DUE TO TRAUMA 07/02/2007  . COPD (chronic obstructive pulmonary disease) (HCC) 07/02/2007  . OSTEOARTHRITIS 07/02/2007  . DEGENERATIVE JOINT DISEASE, CERVICAL SPINE 07/02/2007    Past Surgical History:  Procedure Laterality Date  . ABDOMINAL HYSTERECTOMY    . CHOLECYSTECTOMY       OB History   No obstetric history on file.     Family History  Problem Relation Age of Onset  . Alcohol abuse Other   . Anxiety disorder Other   . Coronary artery disease Other   .  Depression Other   . Stroke Other     Social History   Tobacco Use  . Smoking status: Current Every Day Smoker    Packs/day: 0.50    Years: 56.00    Pack years: 28.00    Start date: 1964  . Smokeless tobacco: Never Used  Substance Use Topics  . Alcohol use: No  . Drug use: No    Home Medications Prior to Admission medications   Medication Sig Start Date End  Date Taking? Authorizing Provider  ALPRAZolam Prudy Feeler) 0.5 MG tablet Take 1 tablet (0.5 mg total) by mouth 2 (two) times daily as needed. 05/28/20   Corwin Levins, MD  amLODipine (NORVASC) 5 MG tablet Take 1 tablet (5 mg total) by mouth daily. 05/28/20 05/28/21  Corwin Levins, MD  amoxicillin-clavulanate (AUGMENTIN) 875-125 MG tablet Take 1 tablet by mouth 2 (two) times daily. 06/30/20   Myrlene Broker, MD  amphetamine-dextroamphetamine (ADDERALL) 30 MG tablet TAKE 1 TABLET BY MOUTH TWICE PER DAY 07/24/20   Corwin Levins, MD  cephALEXin (KEFLEX) 500 MG capsule Take 1 capsule (500 mg total) by mouth 2 (two) times daily for 5 days. 08/07/20 08/12/20  Oriel Rumbold, DO  Fluticasone-Salmeterol (ADVAIR) 250-50 MCG/DOSE AEPB Inhale 1 puff into the lungs 2 (two) times daily. 05/28/20   Corwin Levins, MD  oxyCODONE-acetaminophen (PERCOCET/ROXICET) 5-325 MG tablet Take by mouth. 05/20/20   [provider]  pantoprazole (PROTONIX) 40 MG tablet Take 1 tablet (40 mg total) by mouth daily. 06/30/20   Myrlene Broker, MD  FLUoxetine (PROZAC) 20 MG capsule Take 1 capsule (20 mg total) by mouth daily. 09/07/11 02/09/12  Corwin Levins, MD    Allergies    Morphine and Hydrochlorothiazide  Review of Systems   Review of Systems  Constitutional: Positive for fatigue. Negative for chills and fever.  HENT: Negative for ear pain, rhinorrhea and sore throat.   Eyes: Negative for pain and visual disturbance.  Respiratory: Negative for cough and shortness of breath.   Cardiovascular: Negative for chest pain and palpitations.  Gastrointestinal: Negative for abdominal pain and vomiting.  Genitourinary: Positive for dysuria. Negative for hematuria.  Musculoskeletal: Negative for arthralgias and back pain.  Skin: Negative for color change and rash.  Neurological: Positive for weakness (generalized). Negative for seizures and syncope.  All other systems reviewed and are negative.   Physical Exam Updated Vital  Signs BP (!) 200/116   Pulse 95   Temp 98.4 F (36.9 C) (Oral)   Resp (!) 28   Wt 60 kg   SpO2 100%   BMI 22.36 kg/m   Physical Exam Vitals and nursing note reviewed.  Constitutional:      General: She is not in acute distress.    Appearance: She is well-developed. She is not ill-appearing.  HENT:     Head: Normocephalic and atraumatic.     Right Ear: Tympanic membrane normal.     Left Ear: Tympanic membrane normal.     Nose: Nose normal.     Mouth/Throat:     Mouth: Mucous membranes are moist.  Eyes:     Extraocular Movements: Extraocular movements intact.     Conjunctiva/sclera: Conjunctivae normal.     Pupils: Pupils are equal, round, and reactive to light.  Cardiovascular:     Rate and Rhythm: Normal rate and regular rhythm.     Pulses: Normal pulses.     Heart sounds: Normal heart sounds. No murmur heard.   Pulmonary:  Effort: Pulmonary effort is normal. No respiratory distress.     Breath sounds: Normal breath sounds.  Abdominal:     Palpations: Abdomen is soft.     Tenderness: There is no abdominal tenderness.  Musculoskeletal:        General: Normal range of motion.     Cervical back: Normal range of motion and neck supple.  Skin:    General: Skin is warm and dry.  Neurological:     General: No focal deficit present.     Mental Status: She is alert and oriented to person, place, and time.     Cranial Nerves: No cranial nerve deficit.     Sensory: No sensory deficit.     Motor: No weakness.     Coordination: Coordination normal.     ED Results / Procedures / Treatments   Labs (all labs ordered are listed, but only abnormal results are displayed) Labs Reviewed  CBC WITH DIFFERENTIAL/PLATELET - Abnormal; Notable for the following components:      Result Value   RBC 3.74 (*)    Hemoglobin 10.5 (*)    HCT 32.3 (*)    All other components within normal limits  BASIC METABOLIC PANEL - Abnormal; Notable for the following components:   Potassium 3.0  (*)    CO2 21 (*)    Glucose, Bld 117 (*)    BUN 27 (*)    Creatinine, Ser 1.50 (*)    Calcium 8.6 (*)    GFR calc non Af Amer 35 (*)    GFR calc Af Amer 41 (*)    All other components within normal limits  URINALYSIS, ROUTINE W REFLEX MICROSCOPIC - Abnormal; Notable for the following components:   APPearance CLOUDY (*)    Hgb urine dipstick LARGE (*)    Protein, ur 30 (*)    Nitrite POSITIVE (*)    Leukocytes,Ua LARGE (*)    All other components within normal limits  URINALYSIS, MICROSCOPIC (REFLEX) - Abnormal; Notable for the following components:   Bacteria, UA MANY (*)    All other components within normal limits  URINE CULTURE    EKG None  Radiology DG Chest Portable 1 View  Result Date: 08/07/2020 CLINICAL DATA:  Weakness EXAM: PORTABLE CHEST 1 VIEW COMPARISON:  12/07/2017 chest radiograph and prior. FINDINGS: The heart size and mediastinal contours are within normal limits. Both lungs are clear. No pneumothorax or pleural effusion. No acute osseous abnormality. IMPRESSION: No focal airspace disease. Electronically Signed   By: Stana Bunting M.D.   On: 08/07/2020 08:53    Procedures Procedures (including critical care time)  Medications Ordered in ED Medications - No data to display  ED Course  I have reviewed the triage vital signs and the nursing notes.  Pertinent labs & imaging results that were available during my care of the patient were reviewed by me and considered in my medical decision making (see chart for details).    MDM Rules/Calculators/A&P                          Jenica Costilow is a 69 year old female with history of depression, anxiety who presents to the ED with multiple complaints.  Overall unremarkable vitals.  Patient mildly anxious.  When I have patient focus she is mostly concerned about urinary symptoms.  However she is concerned that she has poison in her body but I think that she is concerned about having a systemic infection.  Vital signs suggest otherwise.  No concern for sepsis but will check basic labs, urinalysis, chest x-ray.  Denies any chest pain, shortness of breath.  Overall will perform general work-up including urinalysis.  Suspect some underlying anxiety as main cause of visit today.  Urinalysis consistent with infection.  No leukocytosis.  Normal vitals.  No concern for sepsis.  Creatinine mildly elevated from baseline to 1.5 from 1.2 but no recent kidney function will prescribe antibiotics.  Will have patient follow-up closely with primary care doctor to recheck BMP and urinalysis.  Understands return precautions.  Discharged in good condition.  This chart was dictated using voice recognition software.  Despite best efforts to proofread,  errors can occur which can change the documentation meaning.   Final Clinical Impression(s) / ED Diagnoses Final diagnoses:  Lower urinary tract infectious disease    Rx / DC Orders ED Discharge Orders         Ordered    cephALEXin (KEFLEX) 500 MG capsule  2 times daily        08/07/20 0942           Virgina Norfolk, DO 08/07/20 [phone]

## 2020-08-07 NOTE — ED Notes (Signed)
ED Provider at bedside. 

## 2020-08-09 LAB — URINE CULTURE: Culture: >=100000 — AB

## 2020-08-15 DIAGNOSIS — Z20822 Contact with and (suspected) exposure to covid-19: Secondary | ICD-10-CM | POA: Diagnosis not present

## 2020-08-15 DIAGNOSIS — R05 Cough: Secondary | ICD-10-CM | POA: Diagnosis not present

## 2020-08-18 ENCOUNTER — Other Ambulatory Visit: Payer: Self-pay

## 2020-08-18 ENCOUNTER — Other Ambulatory Visit: Payer: Self-pay | Admitting: Internal Medicine

## 2020-08-18 ENCOUNTER — Ambulatory Visit (INDEPENDENT_AMBULATORY_CARE_PROVIDER_SITE_OTHER): Payer: PPO | Admitting: Internal Medicine

## 2020-08-18 ENCOUNTER — Encounter: Payer: Self-pay | Admitting: Internal Medicine

## 2020-08-18 VITALS — BP 140/90 | HR 81 | Temp 98.3°F | Ht 64.5 in | Wt 135.0 lb

## 2020-08-18 DIAGNOSIS — J438 Other emphysema: Secondary | ICD-10-CM | POA: Diagnosis not present

## 2020-08-18 DIAGNOSIS — E559 Vitamin D deficiency, unspecified: Secondary | ICD-10-CM

## 2020-08-18 DIAGNOSIS — I10 Essential (primary) hypertension: Secondary | ICD-10-CM | POA: Diagnosis not present

## 2020-08-18 DIAGNOSIS — K219 Gastro-esophageal reflux disease without esophagitis: Secondary | ICD-10-CM | POA: Diagnosis not present

## 2020-08-18 DIAGNOSIS — F411 Generalized anxiety disorder: Secondary | ICD-10-CM

## 2020-08-18 DIAGNOSIS — J441 Chronic obstructive pulmonary disease with (acute) exacerbation: Secondary | ICD-10-CM

## 2020-08-18 MED ORDER — LEVOFLOXACIN 500 MG PO TABS: 500.0000 mg | ORAL_TABLET | Freq: Every day | ORAL | 0 refills | Status: AC

## 2020-08-18 MED ORDER — FLUTICASONE-SALMETEROL 250-50 MCG/DOSE IN AEPB
1.0000 | INHALATION_SPRAY | Freq: Two times a day (BID) | RESPIRATORY_TRACT | 3 refills | Status: DC
Start: 2020-08-18 — End: 2020-08-18

## 2020-08-18 MED ORDER — PREDNISONE 10 MG PO TABS: ORAL_TABLET | ORAL | 0 refills | Status: DC

## 2020-08-18 MED ORDER — ALBUTEROL SULFATE HFA 108 (90 BASE) MCG/ACT IN AERS: 2.0000 | INHALATION_SPRAY | Freq: Four times a day (QID) | RESPIRATORY_TRACT | 11 refills | Status: DC | PRN

## 2020-08-18 MED ORDER — CLONAZEPAM 0.5 MG PO TABS: 0.5000 mg | ORAL_TABLET | Freq: Two times a day (BID) | ORAL | 1 refills | Status: DC | PRN

## 2020-08-18 MED ORDER — PANTOPRAZOLE SODIUM 40 MG PO TBEC
40.0000 mg | DELAYED_RELEASE_TABLET | Freq: Every day | ORAL | 3 refills | Status: DC
Start: 2020-08-18 — End: 2021-10-18

## 2020-08-18 NOTE — Patient Instructions (Signed)
Please take all new medication as prescribed  - the antibiotic, prednisone  Please continue all other medications as before, including the advair every day, albuterol as needed, and protonix  Please take all new medication as prescribed - the clonazepam for nerves if needed  Please have the pharmacy call with any other refills you may need.  Please continue your efforts at being more active, low cholesterol diet, and weight control.  You are otherwise up to date with prevention measures today.  Please keep your appointments with your specialists as you may have planned  You will be contacted regarding the referral for: pulmonary

## 2020-08-18 NOTE — Progress Notes (Signed)
Subjective:    Patient ID: Kathy Vargas, female    DOB: 06/16/1951, 69 y.o.   MRN: [id number]  HPI  Here with acute onset mild to mod 2-3 days ST, HA, general weakness and malaise, with prod cough greenish sputum, but Pt denies chest pain, increased sob or doe, wheezing, orthopnea, PND, increased LE swelling, palpitations, dizziness or syncope, except for mild wheezing x 1-2 days.  COVID neg/flu neg on sept 25.  S/p recent tx for UTI  - Denies urinary symptoms such as dysuria, frequency, urgency, flank pain, hematuria or n/v, fever, chills.   Almost quit tobaccon, only 2-3 cig/day now.  Denies c/omild worsening reflux, but no other abd pain, dysphagia, n/v, bowel change or blood.  Denies worsening depressive symptoms, suicidal ideation, or panic; has ongoing anxiety, worse with more breathing symptoms Past Medical History:  Diagnosis Date  . ABDOMINAL TENDERNESS 01/21/2011  . ACUTE GINGIVITIS NONPLAQUE INDUCED 06/11/2010  . ADD 09/05/2008  . Alcohol abuse, in remission 09/27/2007  . ALLERGIC RHINITIS 03/02/2009  . ANXIETY 07/02/2007  . BACK PAIN 05/19/2009  . CHRONIC OBSTRUCTIVE PULMONARY DISEASE, ACUTE EXACERBATION 08/06/2009  . COPD 07/02/2007  . DEGENERATIVE JOINT DISEASE, CERVICAL SPINE 07/02/2007  . DEPRESSION 07/02/2007  . GASTROENTERITIS, VIRAL 11/29/2010  . GERD 01/21/2011  . Headache(784.0) 07/02/2007  . HYPOTHYROIDISM 07/02/2007  . Lumbar disc disease   . Lumbar pain with radiation down right leg 06/01/2011  . OSTEOARTHRITIS 07/02/2007  . Other and unspecified ovarian cyst 05/19/2009  . PAIN, CHRONIC, DUE TO TRAUMA 07/02/2007  . PELVIC  PAIN 03/29/2010  . UTI 05/19/2009  . Vitamin D insufficiency    Past Surgical History:  Procedure Laterality Date  . ABDOMINAL HYSTERECTOMY    . CHOLECYSTECTOMY      reports that she has been smoking. She started smoking about 57 years ago. She has a 28.00 pack-year smoking history. She has never used smokeless tobacco. She reports that she does not drink  alcohol and does not use drugs. family history includes Alcohol abuse in an other family member; Anxiety disorder in an other family member; Coronary artery disease in an other family member; Depression in an other family member; Stroke in an other family member. Allergies  Allergen Reactions  . Morphine Nausea And Vomiting  . Hydrochlorothiazide Other (See Comments)    "sees spots"   Current Outpatient Medications on File Prior to Visit  Medication Sig Dispense Refill  . ALPRAZolam (XANAX) 0.5 MG tablet Take 1 tablet (0.5 mg total) by mouth 2 (two) times daily as needed. 60 tablet 5  . amLODipine (NORVASC) 5 MG tablet Take 1 tablet (5 mg total) by mouth daily. 90 tablet 3  . amphetamine-dextroamphetamine (ADDERALL) 30 MG tablet TAKE 1 TABLET BY MOUTH TWICE PER DAY 60 tablet 0  . benzonatate (TESSALON) 200 MG capsule Take by mouth.    . cetirizine (ZYRTEC) 10 MG tablet Take 10 mg by mouth daily.    . meclizine (ANTIVERT) 25 MG tablet Take by mouth.    . oxyCODONE-acetaminophen (PERCOCET/ROXICET) 5-325 MG tablet Take by mouth.    . [DISCONTINUED] FLUoxetine (PROZAC) 20 MG capsule Take 1 capsule (20 mg total) by mouth daily. 30 capsule 11   No current facility-administered medications on file prior to visit.    Review of Systems All otherwise neg per pt    Objective:   Physical Exam BP 140/90 (BP Location: Left Arm, Patient Position: Sitting, Cuff Size: Large)   Pulse 81   Temp 98.3 F (36.8 C) (  Oral)   Ht 5' 4.5" (1.638 m)   Wt 135 lb (61.2 kg)   SpO2 99%   BMI 22.81 kg/m  VS noted,  Constitutional: Pt appears in NAD HENT: Head: NCAT.  Right Ear: External ear normal.  Left Ear: External ear normal.  Eyes: . Pupils are equal, round, and reactive to light. Conjunctivae and EOM are normal Nose: without d/c or deformity Neck: Neck supple. Gross normal ROM Cardiovascular: Normal rate and regular rhythm.   Pulmonary/Chest: Effort mild increased and breath sounds decreased without  rales but with few trace bilat wheezing.  Abd:  Soft, NT, ND, + BS, no organomegaly Neurological: Pt is alert. At baseline orientation, motor grossly intact Skin: Skin is warm. No rashes, other new lesions, no LE edema Psychiatric: Pt behavior is normal without agitation , 2+ nervous All otherwise neg per pt Lab Results  Component Value Date   WBC 8.4 08/07/2020   HGB 10.5 (L) 08/07/2020   HCT 32.3 (L) 08/07/2020   PLT 270 08/07/2020   GLUCOSE 117 (H) 08/07/2020   CHOL 214 (H) 05/29/2019   TRIG 70.0 05/29/2019   HDL 70.90 05/29/2019   LDLDIRECT 121.4 12/02/2010   LDLCALC 129 (H) 05/29/2019   ALT 15 05/29/2019   AST 18 05/29/2019   NA 135 08/07/2020   K 3.0 (L) 08/07/2020   CL 103 08/07/2020   CREATININE 1.50 (H) 08/07/2020   BUN 27 (H) 08/07/2020   CO2 21 (L) 08/07/2020   TSH 1.43 05/29/2019   HGBA1C 5.9 05/29/2019      Assessment & Plan:

## 2020-08-23 ENCOUNTER — Encounter: Payer: Self-pay | Admitting: Internal Medicine

## 2020-08-23 NOTE — Assessment & Plan Note (Signed)
Also refer pulm

## 2020-08-23 NOTE — Assessment & Plan Note (Signed)
Also for klonopin bid prn, , to f/u any worsening symptoms or concerns

## 2020-08-23 NOTE — Assessment & Plan Note (Signed)
Cont oral treatment

## 2020-08-23 NOTE — Assessment & Plan Note (Signed)
Mild to mod, for PPI daily,  to f/u any worsening symptoms or concerns

## 2020-08-23 NOTE — Assessment & Plan Note (Addendum)
Mild to mod, for levaquiin course, predpac, albuterol prn, advair bid  I spent 31 minutes in preparing to see the patient by review of recent labs, imaging and procedures, obtaining and reviewing separately obtained history, communicating with the patient and family or caregiver, ordering medications, tests or procedures, and documenting clinical information in the EHR including the differential Dx, treatment, and any further evaluation and other management of copd exacerbation, copd, vit d def, anxiety, gerd, htn

## 2020-08-23 NOTE — Assessment & Plan Note (Signed)
stable overall by history and exam, recent data reviewed with pt, and pt to continue medical treatment as before,  to f/u any worsening symptoms or concerns  

## 2020-08-28 ENCOUNTER — Other Ambulatory Visit: Payer: Self-pay | Admitting: Internal Medicine

## 2020-08-28 NOTE — Telephone Encounter (Signed)
Done erx 

## 2020-10-19 ENCOUNTER — Other Ambulatory Visit: Payer: Self-pay | Admitting: Internal Medicine

## 2020-10-19 IMAGING — DX DG CHEST 1V PORT
1 series · 1 of 1 positions shown · non-contrast
Comparison: 12/07/2017 chest radiograph and prior.

CLINICAL DATA: Weakness

EXAM:
PORTABLE CHEST 1 VIEW

[chest ap]
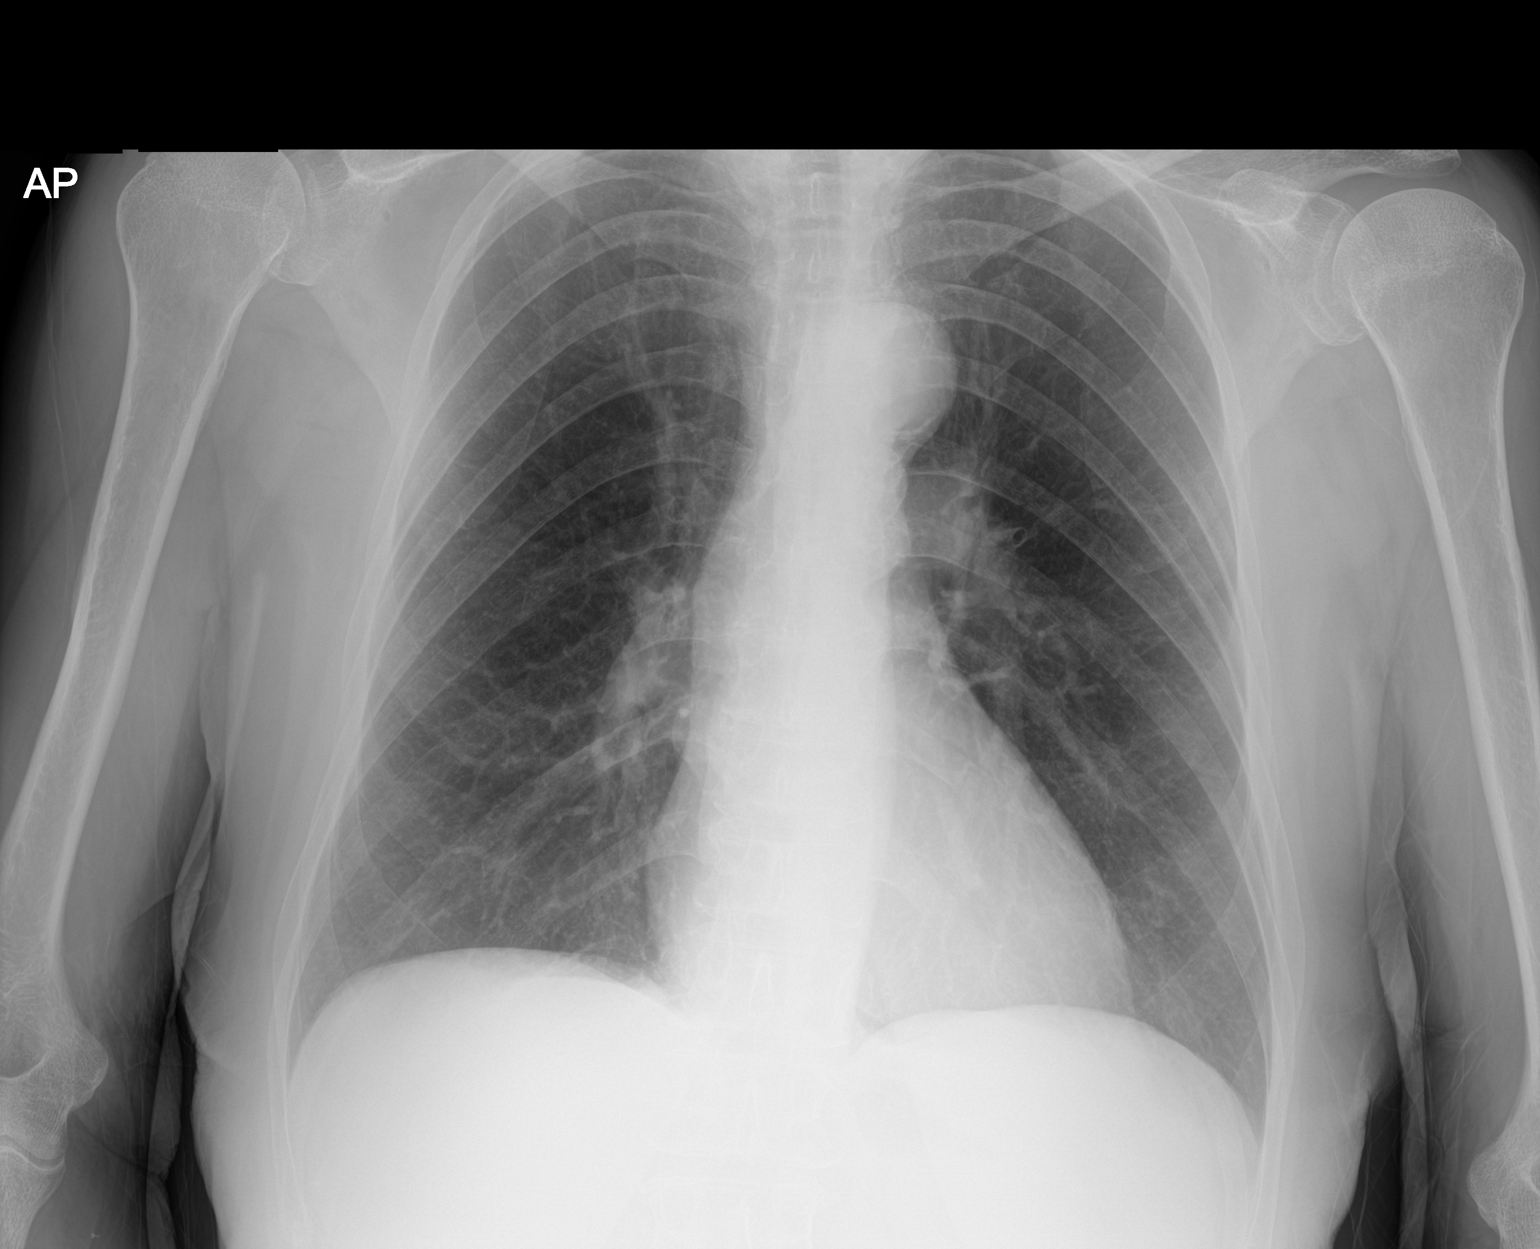

[1 of 1 positions shown; findings below may reference images not displayed]

FINDINGS: The heart size and mediastinal contours are within normal limits.
Both lungs are clear. No pneumothorax or pleural effusion. No acute
osseous abnormality.
IMPRESSION: No focal airspace disease.

## 2020-10-20 DIAGNOSIS — Z1152 Encounter for screening for COVID-19: Secondary | ICD-10-CM | POA: Diagnosis not present

## 2020-10-20 DIAGNOSIS — Z20822 Contact with and (suspected) exposure to covid-19: Secondary | ICD-10-CM | POA: Diagnosis not present

## 2020-10-21 ENCOUNTER — Other Ambulatory Visit (HOSPITAL_COMMUNITY): Payer: Self-pay | Admitting: General Surgery

## 2020-10-29 ENCOUNTER — Institutional Professional Consult (permissible substitution): Payer: PPO | Admitting: Internal Medicine

## 2020-12-16 ENCOUNTER — Other Ambulatory Visit: Payer: Self-pay | Admitting: Internal Medicine

## 2021-01-28 ENCOUNTER — Other Ambulatory Visit: Payer: Self-pay | Admitting: Internal Medicine

## 2021-02-11 ENCOUNTER — Other Ambulatory Visit: Payer: Self-pay

## 2021-02-11 ENCOUNTER — Other Ambulatory Visit: Payer: Self-pay | Admitting: Internal Medicine

## 2021-02-11 ENCOUNTER — Ambulatory Visit (INDEPENDENT_AMBULATORY_CARE_PROVIDER_SITE_OTHER): Payer: Medicare Other | Admitting: Internal Medicine

## 2021-02-11 VITALS — BP 120/70 | HR 83 | Temp 98.5°F | Ht 64.5 in | Wt 123.0 lb

## 2021-02-11 DIAGNOSIS — E538 Deficiency of other specified B group vitamins: Secondary | ICD-10-CM | POA: Diagnosis not present

## 2021-02-11 DIAGNOSIS — R739 Hyperglycemia, unspecified: Secondary | ICD-10-CM | POA: Diagnosis not present

## 2021-02-11 DIAGNOSIS — E559 Vitamin D deficiency, unspecified: Secondary | ICD-10-CM | POA: Diagnosis not present

## 2021-02-11 DIAGNOSIS — N390 Urinary tract infection, site not specified: Secondary | ICD-10-CM | POA: Diagnosis not present

## 2021-02-11 DIAGNOSIS — Z0001 Encounter for general adult medical examination with abnormal findings: Secondary | ICD-10-CM | POA: Diagnosis not present

## 2021-02-11 DIAGNOSIS — I10 Essential (primary) hypertension: Secondary | ICD-10-CM | POA: Diagnosis not present

## 2021-02-11 DIAGNOSIS — J441 Chronic obstructive pulmonary disease with (acute) exacerbation: Secondary | ICD-10-CM

## 2021-02-11 DIAGNOSIS — F411 Generalized anxiety disorder: Secondary | ICD-10-CM

## 2021-02-11 MED ORDER — CEFTRIAXONE SODIUM 1 G IJ SOLR
1.0000 g | Freq: Once | INTRAMUSCULAR | Status: AC
  Administered 2021-02-11: 1 g via INTRAMUSCULAR

## 2021-02-11 MED ORDER — LEVOFLOXACIN 500 MG PO TABS: 500.0000 mg | ORAL_TABLET | Freq: Every day | ORAL | 0 refills | Status: DC

## 2021-02-11 MED ORDER — PREDNISONE 10 MG PO TABS
ORAL_TABLET | ORAL | 0 refills | Status: DC
Start: 2021-02-11 — End: 2021-02-11

## 2021-02-11 MED ORDER — ALPRAZOLAM 0.5 MG PO TABS
0.5000 mg | ORAL_TABLET | Freq: Two times a day (BID) | ORAL | 5 refills | Status: DC | PRN
Start: 2021-02-11 — End: 2021-02-11

## 2021-02-11 NOTE — Progress Notes (Signed)
Patient ID: Kathy Vargas, female   DOB: March 10, 1951, 70 y.o.   MRN: [id number]         Chief Complaint:: wellness exam and uti, copd exacerbation, and anxiety       HPI:  Kathy Vargas is a 70 y.o. female here for wellness exam; declines tdap, o/w up to date with preventive referrals and immunizations                        Also c/o 3 days onset dysuria, frequency and low mid abd pain, but Denies urinary symptoms such as urgency, flank pain, hematuria or n/v, fever, chills.  Incidentally also it seems has 2-3 days onset mild worsening wheeziness, non prod cough and sob/doe despite good compliance with meds and inhalers, which help only short times.  Pt denies chest pain, orthopnea, PND, increased LE swelling, palpitations, dizziness or syncope. Denies new worsening neuro s/s.   Pt denies polydipsia, polyuria,  Pt denies fever, wt loss, night sweats, loss of appetite, or other constitutional symptoms. Denies worsening depressive symptoms, suicidal ideation, or panic; has ongoing anxiety     Wt Readings from Last 3 Encounters:  02/11/21 123 lb (55.8 kg)  08/18/20 135 lb (61.2 kg)  08/07/20 132 lb 5 oz (60 kg)   BP Readings from Last 3 Encounters:  02/11/21 120/70  08/18/20 140/90  08/07/20 (!) 161/76   Immunization History  Administered Date(s) Administered  . Fluad Quad(high Dose 65+) 10/02/2019  . Influenza Split 09/07/2011, 10/11/2012  . Influenza, High Dose Seasonal PF 09/27/2016, 08/15/2017, 09/04/2018  . Influenza,inj,Quad PF,6+ Mos 08/22/2013, 10/15/2014, 11/26/2015  . PFIZER(Purple Top)SARS-COV-2 Vaccination 03/07/2020  . Pneumococcal Conjugate-13 11/26/2015  . Pneumococcal Polysaccharide-23 05/30/2017  . Td 12/14/2009  . Zoster 10/15/2014   There are no preventive care reminders to display for this patient.    Past Medical History:  Diagnosis Date  . ABDOMINAL TENDERNESS 01/21/2011  . ACUTE GINGIVITIS NONPLAQUE INDUCED 06/11/2010  . ADD 09/05/2008  . Alcohol abuse, in  remission 09/27/2007  . ALLERGIC RHINITIS 03/02/2009  . ANXIETY 07/02/2007  . BACK PAIN 05/19/2009  . CHRONIC OBSTRUCTIVE PULMONARY DISEASE, ACUTE EXACERBATION 08/06/2009  . COPD 07/02/2007  . DEGENERATIVE JOINT DISEASE, CERVICAL SPINE 07/02/2007  . DEPRESSION 07/02/2007  . GASTROENTERITIS, VIRAL 11/29/2010  . GERD 01/21/2011  . Headache(784.0) 07/02/2007  . HYPOTHYROIDISM 07/02/2007  . Lumbar disc disease   . Lumbar pain with radiation down right leg 06/01/2011  . OSTEOARTHRITIS 07/02/2007  . Other and unspecified ovarian cyst 05/19/2009  . PAIN, CHRONIC, DUE TO TRAUMA 07/02/2007  . PELVIC  PAIN 03/29/2010  . UTI 05/19/2009  . Vitamin D insufficiency    Past Surgical History:  Procedure Laterality Date  . ABDOMINAL HYSTERECTOMY    . CHOLECYSTECTOMY      reports that she has been smoking. She started smoking about 58 years ago. She has a 28.00 pack-year smoking history. She has never used smokeless tobacco. She reports that she does not drink alcohol and does not use drugs. family history includes Alcohol abuse in an other family member; Anxiety disorder in an other family member; Coronary artery disease in an other family member; Depression in an other family member; Stroke in an other family member. Allergies  Allergen Reactions  . Morphine Nausea And Vomiting  . Hydrochlorothiazide Other (See Comments)    "sees spots"   Current Outpatient Medications on File Prior to Visit  Medication Sig Dispense Refill  . albuterol (VENTOLIN HFA) 108 (90 Base)  MCG/ACT inhaler Inhale 2 puffs into the lungs every 6 (six) hours as needed for wheezing or shortness of breath. 18 g 11  . amLODipine (NORVASC) 5 MG tablet Take 1 tablet (5 mg total) by mouth daily. 90 tablet 3  . amphetamine-dextroamphetamine (ADDERALL) 30 MG tablet TAKE 1 TABLET BY MOUTH TWICE DAILY 60 tablet 0  . clonazePAM (KLONOPIN) 0.5 MG tablet Take 1 tablet (0.5 mg total) by mouth 2 (two) times daily as needed for anxiety. 40 tablet 1  .  Fluticasone-Salmeterol (ADVAIR) 250-50 MCG/DOSE AEPB Inhale 1 puff into the lungs 2 (two) times daily. 360 each 3  . meclizine (ANTIVERT) 25 MG tablet Take by mouth.    . pantoprazole (PROTONIX) 40 MG tablet Take 1 tablet (40 mg total) by mouth daily. 90 tablet 3  . benzonatate (TESSALON) 200 MG capsule Take by mouth. (Patient not taking: Reported on 02/11/2021)    . cetirizine (ZYRTEC) 10 MG tablet Take 10 mg by mouth daily. (Patient not taking: Reported on 02/11/2021)    . oxyCODONE-acetaminophen (PERCOCET/ROXICET) 5-325 MG tablet Take by mouth. (Patient not taking: Reported on 02/11/2021)    . [DISCONTINUED] FLUoxetine (PROZAC) 20 MG capsule Take 1 capsule (20 mg total) by mouth daily. 30 capsule 11   No current facility-administered medications on file prior to visit.        ROS:  All others reviewed and negative.  Objective        PE:  BP 120/70   Pulse 83   Temp 98.5 F (36.9 C) (Oral)   Ht 5' 4.5" (1.638 m)   Wt 123 lb (55.8 kg)   SpO2 99%   BMI 20.79 kg/m                 Constitutional: Pt appears in NAD, mild ill appaering               HENT: Head: NCAT.                Right Ear: External ear normal.                 Left Ear: External ear normal.                Eyes: . Pupils are equal, round, and reactive to light. Conjunctivae and EOM are normal               Nose: without d/c or deformity               Neck: Neck supple. Gross normal ROM               Cardiovascular: Normal rate and regular rhythm.                 Pulmonary/Chest: Effort normal and breath sounds decreased without rales but with few bilat wheezing.                Abd:  Soft, mild low mid abd tender, ND, + BS, no organomegaly, no flank tender               Neurological: Pt is alert. At baseline orientation, motor grossly intact               Skin: Skin is warm. No rashes, no other new lesions, LE edema - none               Psychiatric: Pt behavior is normal without agitation   Micro: none  Cardiac  tracings  I have personally interpreted today:  none  Pertinent Radiological findings (summarize): none   Lab Results  Component Value Date   WBC 8.4 08/07/2020   HGB 10.5 (L) 08/07/2020   HCT 32.3 (L) 08/07/2020   PLT 270 08/07/2020   GLUCOSE 117 (H) 08/07/2020   CHOL 214 (H) 05/29/2019   TRIG 70.0 05/29/2019   HDL 70.90 05/29/2019   LDLDIRECT 121.4 12/02/2010   LDLCALC 129 (H) 05/29/2019   ALT 15 05/29/2019   AST 18 05/29/2019   NA 135 08/07/2020   K 3.0 (L) 08/07/2020   CL 103 08/07/2020   CREATININE 1.50 (H) 08/07/2020   BUN 27 (H) 08/07/2020   CO2 21 (L) 08/07/2020   TSH 1.43 05/29/2019   HGBA1C 5.9 05/29/2019   Assessment/Plan:  Kathy Vargas is a 70 y.o. White or Caucasian [1] female with  has a past medical history of ABDOMINAL TENDERNESS (01/21/2011), ACUTE GINGIVITIS NONPLAQUE INDUCED (06/11/2010), ADD (09/05/2008), Alcohol abuse, in remission (09/27/2007), ALLERGIC RHINITIS (03/02/2009), ANXIETY (07/02/2007), BACK PAIN (05/19/2009), CHRONIC OBSTRUCTIVE PULMONARY DISEASE, ACUTE EXACERBATION (08/06/2009), COPD (07/02/2007), DEGENERATIVE JOINT DISEASE, CERVICAL SPINE (07/02/2007), DEPRESSION (07/02/2007), GASTROENTERITIS, VIRAL (11/29/2010), GERD (01/21/2011), Headache(784.0) (07/02/2007), HYPOTHYROIDISM (07/02/2007), Lumbar disc disease, Lumbar pain with radiation down right leg (06/01/2011), OSTEOARTHRITIS (07/02/2007), Other and unspecified ovarian cyst (05/19/2009), PAIN, CHRONIC, DUE TO TRAUMA (07/02/2007), PELVIC  PAIN (03/29/2010), UTI (05/19/2009), and Vitamin D insufficiency.  Encounter for well adult exam with abnormal findings Age and sex appropriate education and counseling updated with regular exercise and diet Referrals for preventative services - none needed Immunizations addressed - declines tdap Smoking counseling  - counseled to quit Evidence for depression or other mood disorder - none significant Most recent labs reviewed. I have personally reviewed and have noted: 1) the  patient's medical and social history 2) The patient's current medications and supplements 3) The patient's height, weight, and BMI have been recorded in the chart   Hyperglycemia Lab Results  Component Value Date   HGBA1C 5.9 05/29/2019   Stable, pt to continue current medical treatment  - diet and wt control   Anxiety state Stable overall, for klonopin prn refill,  to f/u any worsening symptoms or concerns  Vitamin D deficiency Last vitamin D Lab Results  Component Value Date   VD25OH 43.52 05/29/2019   Stable, cont oral replacement  COPD exacerbation (HCC) Mild, for predpac asd,  to f/u any worsening symptoms or concerns  UTI (urinary tract infection) High suspicion, for urine studies, also rocephin 1 gm im, then levaquin course asd,  to f/u any worsening symptoms or concerns  Essential hypertension BP Readings from Last 3 Encounters:  02/11/21 120/70  08/18/20 140/90  08/07/20 (!) 161/76   Stable, pt to continue medical treatment norvasc   Followup: Return in about 6 months (around 08/14/2021).  Oliver Barre, MD 02/14/2021 9:26 AM [facility]

## 2021-02-11 NOTE — Patient Instructions (Signed)
You had the antibiotic shot today (rocephin)  Please take all new medication as prescribed- the levaquin and prednisone  Please continue all other medications as before, and refills have been done if requested.  Please have the pharmacy call with any other refills you may need.  Please continue your efforts at being more active, low cholesterol diet, and weight control.  You are otherwise up to date with prevention measures today.  Please keep your appointments with your specialists as you may have planned  Please go to the LAB at the blood drawing area for the tests to be done  You will be contacted by phone if any changes need to be made immediately.  Otherwise, you will receive a letter about your results with an explanation, but please check with MyChart first.  Please remember to sign up for MyChart if you have not done so, as this will be important to you in the future with finding out test results, communicating by private email, and scheduling acute appointments online when needed.  Please make an Appointment to return in 6 months, or sooner if needed

## 2021-02-14 ENCOUNTER — Encounter: Payer: Self-pay | Admitting: Internal Medicine

## 2021-02-14 DIAGNOSIS — I10 Essential (primary) hypertension: Secondary | ICD-10-CM | POA: Insufficient documentation

## 2021-02-14 NOTE — Assessment & Plan Note (Signed)
BP Readings from Last 3 Encounters:  02/11/21 120/70  08/18/20 140/90  08/07/20 (!) 161/76   Stable, pt to continue medical treatment norvasc

## 2021-02-14 NOTE — Assessment & Plan Note (Signed)
Lab Results  Component Value Date   HGBA1C 5.9 05/29/2019   Stable, pt to continue current medical treatment  - diet and wt control

## 2021-02-14 NOTE — Assessment & Plan Note (Signed)
Last vitamin D Lab Results  Component Value Date   VD25OH 43.52 05/29/2019   Stable, cont oral replacement

## 2021-02-14 NOTE — Assessment & Plan Note (Signed)
Mild, for predpac asd,  to f/u any worsening symptoms or concerns 

## 2021-02-14 NOTE — Assessment & Plan Note (Signed)
Stable overall, for klonopin prn refill,  to f/u any worsening symptoms or concerns

## 2021-02-14 NOTE — Assessment & Plan Note (Signed)
Age and sex appropriate education and counseling updated with regular exercise and diet Referrals for preventative services - none needed Immunizations addressed - declines tdap Smoking counseling  - counseled to quit Evidence for depression or other mood disorder - none significant Most recent labs reviewed. I have personally reviewed and have noted: 1) the patient's medical and social history 2) The patient's current medications and supplements 3) The patient's height, weight, and BMI have been recorded in the chart

## 2021-02-14 NOTE — Assessment & Plan Note (Signed)
High suspicion, for urine studies, also rocephin 1 gm im, then levaquin course asd,  to f/u any worsening symptoms or concerns

## 2021-02-22 ENCOUNTER — Other Ambulatory Visit (HOSPITAL_COMMUNITY): Payer: Self-pay

## 2021-02-24 ENCOUNTER — Telehealth: Payer: Self-pay | Admitting: Internal Medicine

## 2021-02-24 ENCOUNTER — Other Ambulatory Visit (HOSPITAL_COMMUNITY): Payer: Self-pay

## 2021-02-24 MED ORDER — PREDNISONE 10 MG PO TABS
ORAL_TABLET | ORAL | 0 refills | Status: AC
  Filled 2021-02-24: qty 18, 9d supply, fill #0

## 2021-02-24 NOTE — Telephone Encounter (Signed)
   Patient requesting refill for Prednisone for wheezing. 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

## 2021-02-24 NOTE — Telephone Encounter (Signed)
Left message for patient to call me back. 

## 2021-02-24 NOTE — Telephone Encounter (Signed)
Ok this time but please make ROV for further symptoms getting worse again

## 2021-02-25 NOTE — Telephone Encounter (Signed)
Tried calling patient. Unable to leave message

## 2021-03-02 NOTE — Telephone Encounter (Signed)
Patient notified and feeling better. Patient's next appointment is in September 2022.

## 2021-03-22 ENCOUNTER — Other Ambulatory Visit (HOSPITAL_COMMUNITY): Payer: Self-pay

## 2021-03-22 ENCOUNTER — Other Ambulatory Visit: Payer: Self-pay | Admitting: Internal Medicine

## 2021-03-22 MED ORDER — AMPHETAMINE-DEXTROAMPHETAMINE 30 MG PO TABS
1.0000 | ORAL_TABLET | Freq: Two times a day (BID) | ORAL | 0 refills | Status: DC
  Filled 2021-03-22: qty 60, 30d supply, fill #0

## 2021-03-22 MED ORDER — CETIRIZINE HCL 10 MG PO TABS
ORAL_TABLET | Freq: Every day | ORAL | 11 refills | Status: DC
  Filled 2021-03-22: qty 30, 30d supply, fill #0

## 2021-03-22 MED FILL — Alprazolam Tab 0.5 MG: ORAL | 30 days supply | Qty: 60 | Fill #0 | Status: AC

## 2021-04-13 ENCOUNTER — Telehealth: Payer: Self-pay

## 2021-04-13 NOTE — Telephone Encounter (Signed)
Left voicemail to call office back to schedule an AWV appt.

## 2021-04-26 ENCOUNTER — Other Ambulatory Visit (HOSPITAL_COMMUNITY): Payer: Self-pay

## 2021-04-26 ENCOUNTER — Other Ambulatory Visit: Payer: Self-pay | Admitting: Internal Medicine

## 2021-04-26 MED ORDER — AMPHETAMINE-DEXTROAMPHETAMINE 30 MG PO TABS
1.0000 | ORAL_TABLET | Freq: Two times a day (BID) | ORAL | 0 refills | Status: DC
  Filled 2021-04-26: qty 60, 30d supply, fill #0

## 2021-04-26 MED FILL — Alprazolam Tab 0.5 MG: ORAL | 30 days supply | Qty: 60 | Fill #1 | Status: AC

## 2021-04-27 ENCOUNTER — Other Ambulatory Visit (HOSPITAL_COMMUNITY): Payer: Self-pay

## 2021-06-08 ENCOUNTER — Other Ambulatory Visit: Payer: Self-pay | Admitting: Internal Medicine

## 2021-06-08 ENCOUNTER — Other Ambulatory Visit (HOSPITAL_COMMUNITY): Payer: Self-pay

## 2021-06-08 MED ORDER — AMPHETAMINE-DEXTROAMPHETAMINE 30 MG PO TABS
1.0000 | ORAL_TABLET | Freq: Two times a day (BID) | ORAL | 0 refills | Status: DC
  Filled 2021-06-08: qty 60, 30d supply, fill #0

## 2021-06-08 MED FILL — Alprazolam Tab 0.5 MG: ORAL | 30 days supply | Qty: 60 | Fill #2 | Status: CN

## 2021-06-08 MED FILL — Alprazolam Tab 0.5 MG: ORAL | 30 days supply | Qty: 60 | Fill #0 | Status: AC

## 2021-06-08 NOTE — Telephone Encounter (Signed)
Need to clarify pharmacy as [facility] is closed please

## 2021-06-08 NOTE — Telephone Encounter (Signed)
[facility] have merged [facility]. Patient confirmed pharmacy. Please advise on prescription. Per PMP last filled 04/27/21

## 2021-06-10 ENCOUNTER — Other Ambulatory Visit (HOSPITAL_COMMUNITY): Payer: Self-pay

## 2021-06-11 ENCOUNTER — Other Ambulatory Visit (HOSPITAL_COMMUNITY): Payer: Self-pay

## 2021-06-18 DIAGNOSIS — T8189XA Other complications of procedures, not elsewhere classified, initial encounter: Secondary | ICD-10-CM | POA: Diagnosis not present

## 2021-06-24 ENCOUNTER — Ambulatory Visit (INDEPENDENT_AMBULATORY_CARE_PROVIDER_SITE_OTHER): Payer: Medicare Other | Admitting: Internal Medicine

## 2021-06-24 ENCOUNTER — Other Ambulatory Visit: Payer: Self-pay

## 2021-06-24 ENCOUNTER — Encounter: Payer: Self-pay | Admitting: Internal Medicine

## 2021-06-24 VITALS — BP 124/80 | HR 85 | Temp 98.4°F | Ht 64.5 in | Wt 121.0 lb

## 2021-06-24 DIAGNOSIS — M545 Low back pain, unspecified: Secondary | ICD-10-CM | POA: Diagnosis not present

## 2021-06-24 DIAGNOSIS — J438 Other emphysema: Secondary | ICD-10-CM

## 2021-06-24 DIAGNOSIS — E559 Vitamin D deficiency, unspecified: Secondary | ICD-10-CM

## 2021-06-24 DIAGNOSIS — E538 Deficiency of other specified B group vitamins: Secondary | ICD-10-CM | POA: Diagnosis not present

## 2021-06-24 DIAGNOSIS — I10 Essential (primary) hypertension: Secondary | ICD-10-CM

## 2021-06-24 DIAGNOSIS — T148XXA Other injury of unspecified body region, initial encounter: Secondary | ICD-10-CM | POA: Diagnosis not present

## 2021-06-24 DIAGNOSIS — M79604 Pain in right leg: Secondary | ICD-10-CM | POA: Diagnosis not present

## 2021-06-24 DIAGNOSIS — F17200 Nicotine dependence, unspecified, uncomplicated: Secondary | ICD-10-CM

## 2021-06-24 DIAGNOSIS — R739 Hyperglycemia, unspecified: Secondary | ICD-10-CM | POA: Diagnosis not present

## 2021-06-24 DIAGNOSIS — M5431 Sciatica, right side: Secondary | ICD-10-CM | POA: Diagnosis not present

## 2021-06-24 MED ORDER — METHYLPREDNISOLONE ACETATE 80 MG/ML IJ SUSP
80.0000 mg | Freq: Once | INTRAMUSCULAR | Status: AC
  Administered 2021-06-24: 80 mg via INTRAMUSCULAR

## 2021-06-24 MED ORDER — PREDNISONE 10 MG PO TABS: ORAL_TABLET | ORAL | 0 refills | Status: DC

## 2021-06-24 NOTE — Progress Notes (Signed)
Patient ID: Kathy Vargas, female   DOB: 05-22-1951, 70 y.o.   MRN: [id number]        Chief Complaint: follow up right arm bruising and hematoma, right sciatica, copd       HPI:  Kathy Vargas is a 70 y.o. female here with c/o very large hematoma occurring 3 days ago after what seemed to her minor trauma of brushing the arm up against a hard object in the home, is not on anticoagulant, but now with 4 x 2  x 1 cm area raised hematoma to the post right arm about 1 cm distal to the elbow, also associated with very large bruising to the more dependent areas of the RUE medially both upper and lower arm.  Fortunately has no s/s of neurovasc compromise - denies worsening pain, swelling, weakness, numbness or tingling.  Pt denies chest pain, increased sob or doe, wheezing, orthopnea, PND, increased LE swelling, palpitations, dizziness or syncope.  Only other c/o if of recurring maybe more frequent in the past wk right low back pain with sciatica like discomfort to below the knee, mild intermittent without rash or weakness, gait change or falls.   Pt denies fever, wt loss, night sweats, loss of appetite, or other constitutional symptoms         Wt Readings from Last 3 Encounters:  06/24/21 121 lb (54.9 kg)  02/11/21 123 lb (55.8 kg)  08/18/20 135 lb (61.2 kg)   BP Readings from Last 3 Encounters:  06/24/21 124/80  02/11/21 120/70  08/18/20 140/90         Past Medical History:  Diagnosis Date   ABDOMINAL TENDERNESS 01/21/2011   ACUTE GINGIVITIS NONPLAQUE INDUCED 06/11/2010   ADD 09/05/2008   Alcohol abuse, in remission 09/27/2007   ALLERGIC RHINITIS 03/02/2009   ANXIETY 07/02/2007   BACK PAIN 05/19/2009   CHRONIC OBSTRUCTIVE PULMONARY DISEASE, ACUTE EXACERBATION 08/06/2009   COPD 07/02/2007   DEGENERATIVE JOINT DISEASE, CERVICAL SPINE 07/02/2007   DEPRESSION 07/02/2007   GASTROENTERITIS, VIRAL 11/29/2010   GERD 01/21/2011   Headache(784.0) 07/02/2007   HYPOTHYROIDISM 07/02/2007   Lumbar disc disease     Lumbar pain with radiation down right leg 06/01/2011   OSTEOARTHRITIS 07/02/2007   Other and unspecified ovarian cyst 05/19/2009   PAIN, CHRONIC, DUE TO TRAUMA 07/02/2007   PELVIC  PAIN 03/29/2010   UTI 05/19/2009   Vitamin D insufficiency    Past Surgical History:  Procedure Laterality Date   ABDOMINAL HYSTERECTOMY     CHOLECYSTECTOMY      reports that she has been smoking cigarettes. She started smoking about 58 years ago. She has a 28.00 pack-year smoking history. She has never used smokeless tobacco. She reports that she does not drink alcohol and does not use drugs. family history includes Alcohol abuse in an other family member; Anxiety disorder in an other family member; Coronary artery disease in an other family member; Depression in an other family member; Stroke in an other family member. Allergies  Allergen Reactions   Morphine Nausea And Vomiting   Hydrochlorothiazide Other (See Comments)    "sees spots"   Current Outpatient Medications on File Prior to Visit  Medication Sig Dispense Refill   albuterol (VENTOLIN HFA) 108 (90 Base) MCG/ACT inhaler Inhale 2 puffs into the lungs every 6 (six) hours as needed for wheezing or shortness of breath. 18 g 11   ALPRAZolam (XANAX) 0.5 MG tablet TAKE 1 TABLET BY MOUTH 2 (TWO) TIMES DAILY AS NEEDED. 60 tablet 5  amphetamine-dextroamphetamine (ADDERALL) 30 MG tablet TAKE 1 TABLET BY MOUTH TWICE DAILY 60 tablet 0   cetirizine (ZYRTEC) 10 MG tablet Take 10 mg by mouth daily.     clonazePAM (KLONOPIN) 0.5 MG tablet Take 1 tablet (0.5 mg total) by mouth 2 (two) times daily as needed for anxiety. 40 tablet 1   Fluticasone-Salmeterol (ADVAIR) 250-50 MCG/DOSE AEPB INHALE 1 PUFF INTO THE LUNGS 2 (TWO) TIMES DAILY. 360 each 3   meclizine (ANTIVERT) 25 MG tablet Take by mouth.     pantoprazole (PROTONIX) 40 MG tablet Take 1 tablet (40 mg total) by mouth daily. 90 tablet 3   amLODipine (NORVASC) 5 MG tablet Take 1 tablet (5 mg total) by mouth daily. 90  tablet 3   [DISCONTINUED] FLUoxetine (PROZAC) 20 MG capsule Take 1 capsule (20 mg total) by mouth daily. 30 capsule 11   No current facility-administered medications on file prior to visit.        ROS:  All others reviewed and negative.  Objective        PE:  BP 124/80 (BP Location: Left Arm, Patient Position: Sitting, Cuff Size: Normal)   Pulse 85   Temp 98.4 F (36.9 C) (Oral)   Ht 5' 4.5" (1.638 m)   Wt 121 lb (54.9 kg)   SpO2 97%   BMI 20.45 kg/m                 Constitutional: Pt appears in NAD               HENT: Head: NCAT.                Right Ear: External ear normal.                 Left Ear: External ear normal.                Eyes: . Pupils are equal, round, and reactive to light. Conjunctivae and EOM are normal               Nose: without d/c or deformity               Neck: Neck supple. Gross normal ROM               Cardiovascular: Normal rate and regular rhythm.                 Pulmonary/Chest: Effort normal and breath sounds without rales or wheezing.                Abd:  Soft, NT, ND, + BS, no organomegaly               Neurological: Pt is alert. At baseline orientation, motor grossly intact               Skin:   LE edema - none; also  x 2  x 1 cm area raised hematoma to the post right arm about 1 cm distal to the elbow, also associated with very large bruising to the more dependent areas of the RUE medially both upper and lower arm, o/w neurovasc intact               Psychiatric: Pt behavior is normal without agitation   Micro: none  Cardiac tracings I have personally interpreted today:  none  Pertinent Radiological findings (summarize): none   Lab Results  Component Value Date   WBC 8.4 08/07/2020   HGB 10.5 (L) 08/07/2020  HCT 32.3 (L) 08/07/2020   PLT 270 08/07/2020   GLUCOSE 117 (H) 08/07/2020   CHOL 214 (H) 05/29/2019   TRIG 70.0 05/29/2019   HDL 70.90 05/29/2019   LDLDIRECT 121.4 12/02/2010   LDLCALC 129 (H) 05/29/2019   ALT 15 05/29/2019    AST 18 05/29/2019   NA 135 08/07/2020   K 3.0 (L) 08/07/2020   CL 103 08/07/2020   CREATININE 1.50 (H) 08/07/2020   BUN 27 (H) 08/07/2020   CO2 21 (L) 08/07/2020   TSH 1.43 05/29/2019   HGBA1C 5.9 05/29/2019   Assessment/Plan:  Tessa Seaberry is a 70 y.o. White or Caucasian [1] female with  has a past medical history of ABDOMINAL TENDERNESS (01/21/2011), ACUTE GINGIVITIS NONPLAQUE INDUCED (06/11/2010), ADD (09/05/2008), Alcohol abuse, in remission (09/27/2007), ALLERGIC RHINITIS (03/02/2009), ANXIETY (07/02/2007), BACK PAIN (05/19/2009), CHRONIC OBSTRUCTIVE PULMONARY DISEASE, ACUTE EXACERBATION (08/06/2009), COPD (07/02/2007), DEGENERATIVE JOINT DISEASE, CERVICAL SPINE (07/02/2007), DEPRESSION (07/02/2007), GASTROENTERITIS, VIRAL (11/29/2010), GERD (01/21/2011), Headache(784.0) (07/02/2007), HYPOTHYROIDISM (07/02/2007), Lumbar disc disease, Lumbar pain with radiation down right leg (06/01/2011), OSTEOARTHRITIS (07/02/2007), Other and unspecified ovarian cyst (05/19/2009), PAIN, CHRONIC, DUE TO TRAUMA (07/02/2007), PELVIC  PAIN (03/29/2010), UTI (05/19/2009), and Vitamin D insufficiency.  Lumbar pain with radiation down right leg Mild worsening without exam change, has responded to depomedrol im 80 in past - for repeat today,  to f/u any worsening symptoms or concerns  COPD (chronic obstructive pulmonary disease) Stable, cont current med tx - ventolin, advair  Smoker counsled to quit, pt not ready  Hyperglycemia Lab Results  Component Value Date   HGBA1C 5.9 05/29/2019   Stable, pt to continue current medical treatment  - diet   Hematoma Very large but benign, no evidence for compartment syndrome, no anticoagulant management needed, d/w pt - I can refer to plastic surgury if she wants but declines due to cost, and advised this will take over 2 -3 mo to resolve  Followup: No follow-ups on file.  Oliver Barre, MD 06/27/2021 10:07 AM [facility]  Med[facility]

## 2021-06-24 NOTE — Patient Instructions (Signed)
You had the steroid shot today  Please take all new medication as prescribed - the prednisone  Please continue all other medications as before, and refills have been done if requested.  Please have the pharmacy call with any other refills you may need.  Please continue your efforts at being more active, low cholesterol diet, and weight control.  Please keep your appointments with your specialists as you may have planned  Please make an Appointment to return in 6 months, or sooner if needed 

## 2021-06-27 DIAGNOSIS — T148XXA Other injury of unspecified body region, initial encounter: Secondary | ICD-10-CM | POA: Insufficient documentation

## 2021-06-27 NOTE — Assessment & Plan Note (Signed)
Lab Results  °Component Value Date  ° HGBA1C 5.9 05/29/2019  ° °Stable, pt to continue current medical treatment  - diet ° °

## 2021-06-27 NOTE — Assessment & Plan Note (Signed)
Stable, cont current med tx - ventolin, advair

## 2021-06-27 NOTE — Assessment & Plan Note (Signed)
Mild worsening without exam change, has responded to depomedrol im 80 in past - for repeat today,  to f/u any worsening symptoms or concerns

## 2021-06-27 NOTE — Assessment & Plan Note (Signed)
Very large but benign, no evidence for compartment syndrome, no anticoagulant management needed, d/w pt - I can refer to plastic surgury if she wants but declines due to cost, and advised this will take over 2 -3 mo to resolve

## 2021-06-27 NOTE — Assessment & Plan Note (Signed)
counsled to quit, pt not ready 

## 2021-06-27 NOTE — Addendum Note (Signed)
Addended by: Corwin Levins on: 06/27/2021 10:10 AM   Modules accepted: Orders

## 2021-07-02 ENCOUNTER — Telehealth: Payer: Self-pay

## 2021-07-02 MED ORDER — PREDNISONE 10 MG PO TABS: ORAL_TABLET | ORAL | 0 refills | Status: DC

## 2021-07-02 NOTE — Addendum Note (Signed)
Addended by: Corwin Levins on: 07/02/2021 07:30 PM   Modules accepted: Orders

## 2021-07-02 NOTE — Telephone Encounter (Signed)
Ok done to [facility] 

## 2021-07-02 NOTE — Telephone Encounter (Signed)
pt has stated she is still experiencing aches and pain and is wanting to know if she can get another round of predniSONE (DELTASONE) 10 MG tablet to help her with her pulled muscle.  **Please send to [facility]

## 2021-07-20 ENCOUNTER — Other Ambulatory Visit: Payer: Self-pay | Admitting: Internal Medicine

## 2021-07-20 ENCOUNTER — Other Ambulatory Visit (HOSPITAL_COMMUNITY): Payer: Self-pay

## 2021-07-20 MED ORDER — AMPHETAMINE-DEXTROAMPHETAMINE 30 MG PO TABS
30.0000 mg | ORAL_TABLET | Freq: Two times a day (BID) | ORAL | 0 refills | Status: DC
  Filled 2021-07-20: qty 60, 30d supply, fill #0

## 2021-07-20 MED FILL — Alprazolam Tab 0.5 MG: ORAL | 30 days supply | Qty: 60 | Fill #1 | Status: AC

## 2021-07-21 ENCOUNTER — Other Ambulatory Visit (HOSPITAL_COMMUNITY): Payer: Self-pay

## 2021-08-20 ENCOUNTER — Ambulatory Visit: Payer: Medicare Other | Admitting: Internal Medicine

## 2021-09-01 ENCOUNTER — Other Ambulatory Visit (HOSPITAL_COMMUNITY): Payer: Self-pay

## 2021-09-01 ENCOUNTER — Other Ambulatory Visit: Payer: Self-pay | Admitting: Internal Medicine

## 2021-09-01 MED ORDER — AMPHETAMINE-DEXTROAMPHETAMINE 30 MG PO TABS
30.0000 mg | ORAL_TABLET | Freq: Two times a day (BID) | ORAL | 0 refills | Status: DC
  Filled 2021-09-01: qty 60, 30d supply, fill #0

## 2021-09-01 MED ORDER — ALPRAZOLAM 0.5 MG PO TABS
0.5000 mg | ORAL_TABLET | Freq: Two times a day (BID) | ORAL | 5 refills | Status: DC | PRN
  Filled 2021-09-01: qty 60, 30d supply, fill #0
  Filled 2021-10-18: qty 60, 30d supply, fill #1
  Filled 2021-12-13: qty 60, 30d supply, fill #2

## 2021-09-02 ENCOUNTER — Other Ambulatory Visit (HOSPITAL_COMMUNITY): Payer: Self-pay

## 2021-09-08 ENCOUNTER — Ambulatory Visit: Payer: Medicare Other

## 2021-09-16 ENCOUNTER — Ambulatory Visit (INDEPENDENT_AMBULATORY_CARE_PROVIDER_SITE_OTHER): Payer: Medicare Other

## 2021-09-16 DIAGNOSIS — Z0000 Encounter for general adult medical examination without abnormal findings: Secondary | ICD-10-CM | POA: Diagnosis not present

## 2021-09-16 NOTE — Patient Instructions (Signed)
Kathy Vargas , Thank you for taking time to come for your Medicare Wellness Visit. I appreciate your ongoing commitment to your health goals. Please review the following plan we discussed and let me know if I can assist you in the future.   Screening recommendations/referrals: Colonoscopy: declined  Mammogram: declined Bone Density: declined Recommended yearly ophthalmology/optometry visit for glaucoma screening and checkup Recommended yearly dental visit for hygiene and checkup  Vaccinations: Influenza vaccine: declined Pneumococcal vaccine: 11/26/2015, 05/30/2017 Tdap vaccine: 12/14/2009; due every 10 years (overdue) Shingles vaccine: declined   Covid-19: 03/07/2020; refused additional doses  Advanced directives: Advance directive discussed with you today. I have provided a copy for you to complete at home and have notarized. Once this is complete please bring a copy in to our office so we can scan it into your chart.  Conditions/risks identified: Yes; Client understands the importance of follow-up with providers by attending scheduled visits and discussed goals to eat healthier, increase physical activity, exercise the brain, socialize more, get enough sleep and make time for laughter.  Next appointment: Please schedule your next Medicare Wellness Visit with your Nurse Health Advisor in 1 year by calling [phone].   Preventive Care 45 Years and Older, Female Preventive care refers to lifestyle choices and visits with your health care provider that can promote health and wellness. What does preventive care include? A yearly physical exam. This is also called an annual well check. Dental exams once or twice a year. Routine eye exams. Ask your health care provider how often you should have your eyes checked. Personal lifestyle choices, including: Daily care of your teeth and gums. Regular physical activity. Eating a healthy diet. Avoiding tobacco and drug use. Limiting alcohol  use. Practicing safe sex. Taking low-dose aspirin every day. Taking vitamin and mineral supplements as recommended by your health care provider. What happens during an annual well check? The services and screenings done by your health care provider during your annual well check will depend on your age, overall health, lifestyle risk factors, and family history of disease. Counseling  Your health care provider may ask you questions about your: Alcohol use. Tobacco use. Drug use. Emotional well-being. Home and relationship well-being. Sexual activity. Eating habits. History of falls. Memory and ability to understand (cognition). Work and work Astronomer. Reproductive health. Screening  You may have the following tests or measurements: Height, weight, and BMI. Blood pressure. Lipid and cholesterol levels. These may be checked every 5 years, or more frequently if you are over 82 years old. Skin check. Lung cancer screening. You may have this screening every year starting at age 18 if you have a 30-pack-year history of smoking and currently smoke or have quit within the past 15 years. Fecal occult blood test (FOBT) of the stool. You may have this test every year starting at age 79. Flexible sigmoidoscopy or colonoscopy. You may have a sigmoidoscopy every 5 years or a colonoscopy every 10 years starting at age 43. Hepatitis C blood test. Hepatitis B blood test. Sexually transmitted disease (STD) testing. Diabetes screening. This is done by checking your blood sugar (glucose) after you have not eaten for a while (fasting). You may have this done every 1-3 years. Bone density scan. This is done to screen for osteoporosis. You may have this done starting at age 55. Mammogram. This may be done every 1-2 years. Talk to your health care provider about how often you should have regular mammograms. Talk with your health care provider about your test  results, treatment options, and if necessary,  the need for more tests. Vaccines  Your health care provider may recommend certain vaccines, such as: Influenza vaccine. This is recommended every year. Tetanus, diphtheria, and acellular pertussis (Tdap, Td) vaccine. You may need a Td booster every 10 years. Zoster vaccine. You may need this after age 53. Pneumococcal 13-valent conjugate (PCV13) vaccine. One dose is recommended after age 58. Pneumococcal polysaccharide (PPSV23) vaccine. One dose is recommended after age 56. Talk to your health care provider about which screenings and vaccines you need and how often you need them. This information is not intended to replace advice given to you by your health care provider. Make sure you discuss any questions you have with your health care provider. Document Released: 12/04/2015 Document Revised: 07/27/2016 Document Reviewed: 09/08/2015 Elsevier Interactive Patient Education  2017 [place] Prevention in the Home Falls can cause injuries. They can happen to people of all ages. There are many things you can do to make your home safe and to help prevent falls. What can I do on the outside of my home? Regularly fix the edges of walkways and driveways and fix any cracks. Remove anything that might make you trip as you walk through a door, such as a raised step or threshold. Trim any bushes or trees on the path to your home. Use bright outdoor lighting. Clear any walking paths of anything that might make someone trip, such as rocks or tools. Regularly check to see if handrails are loose or broken. Make sure that both sides of any steps have handrails. Any raised decks and porches should have guardrails on the edges. Have any leaves, snow, or ice cleared regularly. Use sand or salt on walking paths during winter. Clean up any spills in your garage right away. This includes oil or grease spills. What can I do in the bathroom? Use night lights. Install grab bars by the toilet and in the  tub and shower. Do not use towel bars as grab bars. Use non-skid mats or decals in the tub or shower. If you need to sit down in the shower, use a plastic, non-slip stool. Keep the floor dry. Clean up any water that spills on the floor as soon as it happens. Remove soap buildup in the tub or shower regularly. Attach bath mats securely with double-sided non-slip rug tape. Do not have throw rugs and other things on the floor that can make you trip. What can I do in the bedroom? Use night lights. Make sure that you have a light by your bed that is easy to reach. Do not use any sheets or blankets that are too big for your bed. They should not hang down onto the floor. Have a firm chair that has side arms. You can use this for support while you get dressed. Do not have throw rugs and other things on the floor that can make you trip. What can I do in the kitchen? Clean up any spills right away. Avoid walking on wet floors. Keep items that you use a lot in easy-to-reach places. If you need to reach something above you, use a strong step stool that has a grab bar. Keep electrical cords out of the way. Do not use floor polish or wax that makes floors slippery. If you must use wax, use non-skid floor wax. Do not have throw rugs and other things on the floor that can make you trip. What can I do with my stairs?  Do not leave any items on the stairs. Make sure that there are handrails on both sides of the stairs and use them. Fix handrails that are broken or loose. Make sure that handrails are as long as the stairways. Check any carpeting to make sure that it is firmly attached to the stairs. Fix any carpet that is loose or worn. Avoid having throw rugs at the top or bottom of the stairs. If you do have throw rugs, attach them to the floor with carpet tape. Make sure that you have a light switch at the top of the stairs and the bottom of the stairs. If you do not have them, ask someone to add them for  you. What else can I do to help prevent falls? Wear shoes that: Do not have high heels. Have rubber bottoms. Are comfortable and fit you well. Are closed at the toe. Do not wear sandals. If you use a stepladder: Make sure that it is fully opened. Do not climb a closed stepladder. Make sure that both sides of the stepladder are locked into place. Ask someone to hold it for you, if possible. Clearly mark and make sure that you can see: Any grab bars or handrails. First and last steps. Where the edge of each step is. Use tools that help you move around (mobility aids) if they are needed. These include: Canes. Walkers. Scooters. Crutches. Turn on the lights when you go into a dark area. Replace any light bulbs as soon as they burn out. Set up your furniture so you have a clear path. Avoid moving your furniture around. If any of your floors are uneven, fix them. If there are any pets around you, be aware of where they are. Review your medicines with your doctor. Some medicines can make you feel dizzy. This can increase your chance of falling. Ask your doctor what other things that you can do to help prevent falls. This information is not intended to replace advice given to you by your health care provider. Make sure you discuss any questions you have with your health care provider. Document Released: 09/03/2009 Document Revised: 04/14/2016 Document Reviewed: 12/12/2014 Elsevier Interactive Patient Education  2017 [employer].

## 2021-09-16 NOTE — Progress Notes (Signed)
I connected with Madigan Rosensteel today by telephone and verified that I am speaking with the correct person using two identifiers. Location patient: home Location provider: work Persons participating in the virtual visit: patient, provider.   I discussed the limitations, risks, security and privacy concerns of performing an evaluation and management service by telephone and the availability of in person appointments. I also discussed with the patient that there may be a patient responsible charge related to this service. The patient expressed understanding and verbally consented to this telephonic visit.    Interactive audio and video telecommunications were attempted between this provider and patient, however failed, due to patient having technical difficulties OR patient did not have access to video capability.  We continued and completed visit with audio only.  Some vital signs may be absent or patient reported.   Time Spent with patient on telephone encounter: 40 minutes  Subjective:   Kathy Vargas is a 70 y.o. female who presents for an Initial Medicare Annual Wellness Visit.  Review of Systems     Cardiac Risk Factors include: advanced age (>85men, >41 women);dyslipidemia;family history of premature cardiovascular disease;hypertension;smoking/ tobacco exposure     Objective:    There were no vitals filed for this visit. There is no height or weight on file to calculate BMI.  Advanced Directives 09/16/2021 08/07/2020  Does Patient Have a Medical Advance Directive? No No  Would patient like information on creating a medical advance directive? Yes (MAU/Ambulatory/Procedural Areas - Information given) No - Patient declined    Current Medications (verified) Outpatient Encounter Medications as of 09/16/2021  Medication Sig   albuterol (VENTOLIN HFA) 108 (90 Base) MCG/ACT inhaler Inhale 2 puffs into the lungs every 6 (six) hours as needed for wheezing or shortness of breath.    ALPRAZolam (XANAX) 0.5 MG tablet Take 1 tablet (0.5 mg total) by mouth 2 (two) times daily as needed.   amphetamine-dextroamphetamine (ADDERALL) 30 MG tablet Take 1 tablet by mouth 2 (two) times daily.   cetirizine (ZYRTEC) 10 MG tablet Take 10 mg by mouth daily.   Fluticasone-Salmeterol (ADVAIR) 250-50 MCG/DOSE AEPB INHALE 1 PUFF INTO THE LUNGS 2 (TWO) TIMES DAILY.   meclizine (ANTIVERT) 25 MG tablet Take by mouth.   pantoprazole (PROTONIX) 40 MG tablet Take 1 tablet (40 mg total) by mouth daily.   amLODipine (NORVASC) 5 MG tablet Take 1 tablet (5 mg total) by mouth daily.   predniSONE (DELTASONE) 10 MG tablet 3 tabs by mouth per day for 3 days,2tabs per day for 3 days,1tab per day for 3 days (Patient not taking: Reported on 09/16/2021)   [DISCONTINUED] FLUoxetine (PROZAC) 20 MG capsule Take 1 capsule (20 mg total) by mouth daily.   No facility-administered encounter medications on file as of 09/16/2021.    Allergies (verified) Morphine   History: Past Medical History:  Diagnosis Date   ABDOMINAL TENDERNESS 01/21/2011   ACUTE GINGIVITIS NONPLAQUE INDUCED 06/11/2010   ADD 09/05/2008   Alcohol abuse, in remission 09/27/2007   ALLERGIC RHINITIS 03/02/2009   ANXIETY 07/02/2007   BACK PAIN 05/19/2009   CHRONIC OBSTRUCTIVE PULMONARY DISEASE, ACUTE EXACERBATION 08/06/2009   COPD 07/02/2007   DEGENERATIVE JOINT DISEASE, CERVICAL SPINE 07/02/2007   DEPRESSION 07/02/2007   GASTROENTERITIS, VIRAL 11/29/2010   GERD 01/21/2011   Headache(784.0) 07/02/2007   HYPOTHYROIDISM 07/02/2007   Lumbar disc disease    Lumbar pain with radiation down right leg 06/01/2011   OSTEOARTHRITIS 07/02/2007   Other and unspecified ovarian cyst 05/19/2009   PAIN, CHRONIC, DUE  TO TRAUMA 07/02/2007   PELVIC  PAIN 03/29/2010   UTI 05/19/2009   Vitamin D insufficiency    Past Surgical History:  Procedure Laterality Date   ABDOMINAL HYSTERECTOMY     CHOLECYSTECTOMY     Family History  Problem Relation Age of Onset   Alcohol  abuse Other    Anxiety disorder Other    Coronary artery disease Other    Depression Other    Stroke Other    Social History   Socioeconomic History   Marital status: Divorced    Spouse name: Not on file   Number of children: Not on file   Years of education: Not on file   Highest education level: Not on file  Occupational History   Not on file  Tobacco Use   Smoking status: Every Day    Packs/day: 0.50    Years: 56.00    Pack years: 28.00    Types: Cigarettes    Start date: 1964   Smokeless tobacco: Never  Substance and Sexual Activity   Alcohol use: No   Drug use: No   Sexual activity: Not on file  Other Topics Concern   Not on file  Social History Narrative   Not on file   Social Determinants of Health   Financial Resource Strain: Low Risk    Difficulty of Paying Living Expenses: Not hard at all  Food Insecurity: No Food Insecurity   Worried About Programme researcher, broadcasting/film/video in the Last Year: Never true   Ran Out of Food in the Last Year: Never true  Transportation Needs: No Transportation Needs   Lack of Transportation (Medical): No   Lack of Transportation (Non-Medical): No  Physical Activity: Sufficiently Active   Days of Exercise per Week: 7 days   Minutes of Exercise per Session: 30 min  Stress: No Stress Concern Present   Feeling of Stress : Not at all  Social Connections: Socially Isolated   Frequency of Communication with Friends and Family: More than three times a week   Frequency of Social Gatherings with Friends and Family: Three times a week   Attends Religious Services: Never   Active Member of Clubs or Organizations: No   Attends Engineer, structural: Never   Marital Status: Never married    Tobacco Counseling Ready to quit: Not Answered Counseling given: Not Answered   Clinical Intake:  Pre-visit preparation completed: Yes  Pain : No/denies pain     Nutritional Risks: None Diabetes: No  How often do you need to have someone  help you when you read instructions, pamphlets, or other written materials from your doctor or pharmacy?: 1 - Never What is the last grade level you completed in school?: High School Graduate  Diabetic? no  Interpreter Needed?: No  Information entered by :: Susie Cassette, LPN   Activities of Daily Living In your present state of health, do you have any difficulty performing the following activities: 09/16/2021  Hearing? N  Vision? N  Difficulty concentrating or making decisions? Y  Walking or climbing stairs? N  Dressing or bathing? N  Doing errands, shopping? N  Preparing Food and eating ? N  Using the Toilet? N  In the past six months, have you accidently leaked urine? Y  Do you have problems with loss of bowel control? N  Managing your Medications? N  Managing your Finances? N  Housekeeping or managing your Housekeeping? N  Some recent data might be hidden    Patient Care  Team: Corwin Levins, MD as PCP - General Billie Ruddy, OD as Referring Physician (Optometry)  Indicate any recent Medical Services you may have received from other than [facility] providers in the past year (date may be approximate).     Assessment:   This is a routine wellness examination for Meyers.  Hearing/Vision screen Hearing Screening - Comments:: Patient denied any hearing difficulty.   No hearing aids.  Vision Screening - Comments:: Patient wears corrective glasses/contacts.  Eye exam done annually by: Dr. Billie Ruddy, OD.   Dietary issues and exercise activities discussed: Current Exercise Habits: The patient has a physically strenuous job, but has no regular exercise apart from work., Exercise limited by: respiratory conditions(s);psychological condition(s)   Goals Addressed   None   Depression Screen PHQ 2/9 Scores 09/16/2021 02/11/2021 05/28/2020 11/29/2019 11/27/2018 08/15/2017 04/15/2015  PHQ - 2 Score 0 0 0 0 1 0 1  PHQ- 9 Score - - 0 - - 0 -    Fall Risk Fall Risk  09/16/2021  02/11/2021 05/28/2020 11/29/2019 11/27/2018  Falls in the past year? 1 0 0 0 0  Number falls in past yr: 0 - 0 - -  Injury with Fall? 1 - 0 - -  Risk for fall due to : - - No Fall Risks - -  Follow up - - Falls evaluation completed - -    FALL RISK PREVENTION PERTAINING TO THE HOME:  Any stairs in or around the home? Yes  If so, are there any without handrails? No  Home free of loose throw rugs in walkways, pet beds, electrical cords, etc? Yes  Adequate lighting in your home to reduce risk of falls? Yes   ASSISTIVE DEVICES UTILIZED TO PREVENT FALLS:  Life alert? No  Use of a cane, walker or w/c? No  Grab bars in the bathroom? No  Shower chair or bench in shower? No  Elevated toilet seat or a handicapped toilet? No   TIMED UP AND GO:  Was the test performed? No .  Length of time to ambulate 10 feet: n/a sec.   Gait steady and fast without use of assistive device  Cognitive Function: Normal cognitive status assessed by direct observation by this Nurse Health Advisor. No abnormalities found.          Immunizations Immunization History  Administered Date(s) Administered   Fluad Quad(high Dose 65+) 10/02/2019   Influenza Split 09/07/2011, 10/11/2012   Influenza, High Dose Seasonal PF 09/27/2016, 08/15/2017, 09/04/2018   Influenza,inj,Quad PF,6+ Mos 08/22/2013, 10/15/2014, 11/26/2015   PFIZER(Purple Top)SARS-COV-2 Vaccination 03/07/2020   Pneumococcal Conjugate-13 11/26/2015   Pneumococcal Polysaccharide-23 05/30/2017   Td 12/14/2009   Zoster, Live 10/15/2014    TDAP status: Due, Education has been provided regarding the importance of this vaccine. Advised may receive this vaccine at local pharmacy or Health Dept. Aware to provide a copy of the vaccination record if obtained from local pharmacy or Health Dept. Verbalized acceptance and understanding.  Flu Vaccine status: Declined, Education has been provided regarding the importance of this vaccine but patient still declined.  Advised may receive this vaccine at local pharmacy or Health Dept. Aware to provide a copy of the vaccination record if obtained from local pharmacy or Health Dept. Verbalized acceptance and understanding.  Pneumococcal vaccine status: Declined,  Education has been provided regarding the importance of this vaccine but patient still declined. Advised may receive this vaccine at local pharmacy or Health Dept. Aware to provide a copy of the vaccination record if  obtained from local pharmacy or Health Dept. Verbalized acceptance and understanding.   Covid-19 vaccine status: Declined, Education has been provided regarding the importance of this vaccine but patient still declined. Advised may receive this vaccine at local pharmacy or Health Dept.or vaccine clinic. Aware to provide a copy of the vaccination record if obtained from local pharmacy or Health Dept. Verbalized acceptance and understanding.  Qualifies for Shingles Vaccine? Yes   Zostavax completed Yes   Shingrix Completed?: No.    Education has been provided regarding the importance of this vaccine. Patient has been advised to call insurance company to determine out of pocket expense if they have not yet received this vaccine. Advised may also receive vaccine at local pharmacy or Health Dept. Verbalized acceptance and understanding.  Screening Tests Health Maintenance  Topic Date Due   Zoster Vaccines- Shingrix (1 of 2) Never done   TETANUS/TDAP  02/11/2022 (Originally 12/15/2019)   Pneumonia Vaccine 20+ Years old  Completed   Hepatitis C Screening  Completed   HPV VACCINES  Aged Out   INFLUENZA VACCINE  Discontinued   MAMMOGRAM  Discontinued   DEXA SCAN  Discontinued   COLONOSCOPY (Pts 45-47yrs Insurance coverage will need to be confirmed)  Discontinued   COVID-19 Vaccine  Discontinued    Health Maintenance  Health Maintenance Due  Topic Date Due   Zoster Vaccines- Shingrix (1 of 2) Never done    Colorectal cancer screening: No  longer required.  (Patient declined)  Mammogram status: No longer required due to patient declined.  Bone density: patient declined  Lung Cancer Screening: (Low Dose CT Chest recommended if Age 34-80 years, 30 pack-year currently smoking OR have quit w/in 15years.) does qualify.   Lung Cancer Screening Referral: no  Additional Screening:  Hepatitis C Screening: does qualify; Completed yes  Vision Screening: Recommended annual ophthalmology exams for early detection of glaucoma and other disorders of the eye. Is the patient up to date with their annual eye exam?  Yes  Who is the provider or what is the name of the office in which the patient attends annual eye exams? Billie Ruddy, OD. If pt is not established with a provider, would they like to be referred to a provider to establish care? No .   Dental Screening: Recommended annual dental exams for proper oral hygiene  Community Resource Referral / Chronic Care Management: CRR required this visit?  No   CCM required this visit?  No      Plan:     I have personally reviewed and noted the following in the patient's chart:   Medical and social history Use of alcohol, tobacco or illicit drugs  Current medications and supplements including opioid prescriptions. Patient is not currently taking opioid prescriptions. Functional ability and status Nutritional status Physical activity Advanced directives List of other physicians Hospitalizations, surgeries, and ER visits in previous 12 months Vitals Screenings to include cognitive, depression, and falls Referrals and appointments  In addition, I have reviewed and discussed with patient certain preventive protocols, quality metrics, and best practice recommendations. A written personalized care plan for preventive services as well as general preventive health recommendations were provided to patient.     Mickeal Needy, LPN   16/08/9603   Nurse Notes:  Patient is  cogitatively intact. There were no vitals filed for this visit. There is no height or weight on file to calculate BMI. Patient stated that she has no issues with gait or balance; does not use any assistive devices.

## 2021-10-18 ENCOUNTER — Other Ambulatory Visit: Payer: Self-pay | Admitting: Internal Medicine

## 2021-10-18 ENCOUNTER — Other Ambulatory Visit (HOSPITAL_COMMUNITY): Payer: Self-pay

## 2021-10-18 MED ORDER — AMPHETAMINE-DEXTROAMPHETAMINE 30 MG PO TABS
30.0000 mg | ORAL_TABLET | Freq: Two times a day (BID) | ORAL | 0 refills | Status: DC
  Filled 2021-10-18: qty 60, 30d supply, fill #0

## 2021-10-18 MED ORDER — PANTOPRAZOLE SODIUM 40 MG PO TBEC
40.0000 mg | DELAYED_RELEASE_TABLET | Freq: Every day | ORAL | 2 refills | Status: DC
Start: 2021-10-18 — End: 2022-10-10
  Filled 2021-10-18: qty 90, 90d supply, fill #0
  Filled 2022-09-07: qty 90, 90d supply, fill #1

## 2021-10-19 ENCOUNTER — Other Ambulatory Visit (HOSPITAL_COMMUNITY): Payer: Self-pay

## 2021-12-06 ENCOUNTER — Other Ambulatory Visit: Payer: Self-pay

## 2021-12-06 ENCOUNTER — Other Ambulatory Visit (HOSPITAL_COMMUNITY): Payer: Self-pay

## 2021-12-06 ENCOUNTER — Ambulatory Visit (INDEPENDENT_AMBULATORY_CARE_PROVIDER_SITE_OTHER): Payer: Medicare Other | Admitting: Internal Medicine

## 2021-12-06 ENCOUNTER — Encounter: Payer: Self-pay | Admitting: Internal Medicine

## 2021-12-06 VITALS — BP 112/82 | HR 96 | Temp 98.4°F

## 2021-12-06 DIAGNOSIS — J438 Other emphysema: Secondary | ICD-10-CM

## 2021-12-06 DIAGNOSIS — F17200 Nicotine dependence, unspecified, uncomplicated: Secondary | ICD-10-CM | POA: Diagnosis not present

## 2021-12-06 DIAGNOSIS — J441 Chronic obstructive pulmonary disease with (acute) exacerbation: Secondary | ICD-10-CM | POA: Diagnosis not present

## 2021-12-06 DIAGNOSIS — N390 Urinary tract infection, site not specified: Secondary | ICD-10-CM

## 2021-12-06 LAB — POCT URINALYSIS DIPSTICK
Bilirubin, UA: NEGATIVE
Glucose, UA: NEGATIVE
Ketones, UA: NEGATIVE
Nitrite, UA: NEGATIVE
Protein, UA: NEGATIVE
Spec Grav, UA: 1.01 (ref 1.010–1.025)
Urobilinogen, UA: 0.2 E.U./dL
pH, UA: 5 (ref 5.0–8.0)

## 2021-12-06 MED ORDER — CEFUROXIME AXETIL 500 MG PO TABS
500.0000 mg | ORAL_TABLET | Freq: Two times a day (BID) | ORAL | 0 refills | Status: DC
  Filled 2021-12-06: qty 14, 7d supply, fill #0

## 2021-12-06 MED ORDER — METHYLPREDNISOLONE 4 MG PO TBPK
ORAL_TABLET | ORAL | 0 refills | Status: DC
  Filled 2021-12-06: qty 21, 6d supply, fill #0

## 2021-12-06 MED ORDER — METHYLPREDNISOLONE ACETATE 80 MG/ML IJ SUSP
80.0000 mg | Freq: Once | INTRAMUSCULAR | Status: AC
  Administered 2021-12-06: 80 mg via INTRAMUSCULAR

## 2021-12-06 MED ORDER — HYDROCODONE BIT-HOMATROP MBR 5-1.5 MG/5ML PO SOLN
5.0000 mL | Freq: Three times a day (TID) | ORAL | 0 refills | Status: DC | PRN
  Filled 2021-12-06: qty 240, 16d supply, fill #0

## 2021-12-06 NOTE — Assessment & Plan Note (Signed)
1/2 PPD Discussed

## 2021-12-06 NOTE — Progress Notes (Signed)
Subjective:  Patient ID: Kathy Vargas, female    DOB: 1951/11/17  Age: 71 y.o. MRN: [id number]  CC: Urinary Tract Infection (Pt states she is having burning when urinating. She states the pain is bad sometimes goes up her body)   HPI Kathy Vargas presents for urinary urgency, burning bloating  x 1 week. No fever. H/o UTIs C/o severe cough x 7-10 days  Outpatient Medications Prior to Visit  Medication Sig Dispense Refill   albuterol (VENTOLIN HFA) 108 (90 Base) MCG/ACT inhaler Inhale 2 puffs into the lungs every 6 (six) hours as needed for wheezing or shortness of breath. 18 g 11   ALPRAZolam (XANAX) 0.5 MG tablet Take 1 tablet (0.5 mg total) by mouth 2 (two) times daily as needed. 60 tablet 5   amphetamine-dextroamphetamine (ADDERALL) 30 MG tablet Take 1 tablet by mouth 2 (two) times daily. 60 tablet 0   cetirizine (ZYRTEC) 10 MG tablet Take 10 mg by mouth daily.     meclizine (ANTIVERT) 25 MG tablet Take by mouth.     pantoprazole (PROTONIX) 40 MG tablet Take 1 tablet (40 mg total) by mouth daily. 90 tablet 2   amLODipine (NORVASC) 5 MG tablet Take 1 tablet (5 mg total) by mouth daily. 90 tablet 3   Fluticasone-Salmeterol (ADVAIR) 250-50 MCG/DOSE AEPB INHALE 1 PUFF INTO THE LUNGS 2 (TWO) TIMES DAILY. 360 each 3   predniSONE (DELTASONE) 10 MG tablet 3 tabs by mouth per day for 3 days,2tabs per day for 3 days,1tab per day for 3 days (Patient not taking: Reported on 12/06/2021) 18 tablet 0   No facility-administered medications prior to visit.    ROS: Review of Systems  Constitutional:  Positive for fatigue. Negative for activity change, appetite change, chills and unexpected weight change.  HENT:  Negative for congestion, mouth sores and sinus pressure.   Eyes:  Negative for visual disturbance.  Respiratory:  Positive for cough. Negative for chest tightness.   Cardiovascular:  Positive for chest pain.  Gastrointestinal:  Positive for abdominal pain. Negative for nausea.   Genitourinary:  Positive for dysuria, frequency and urgency. Negative for difficulty urinating and vaginal pain.  Musculoskeletal:  Positive for back pain. Negative for gait problem.  Skin:  Negative for pallor and rash.  Neurological:  Negative for dizziness, tremors, weakness, numbness and headaches.  Psychiatric/Behavioral:  Negative for confusion and sleep disturbance.    Objective:  BP 112/82 (BP Location: Left Arm)    Pulse 96    Temp 98.4 F (36.9 C) (Oral)    SpO2 98%   BP Readings from Last 3 Encounters:  12/06/21 112/82  06/24/21 124/80  02/11/21 120/70    Wt Readings from Last 3 Encounters:  06/24/21 121 lb (54.9 kg)  02/11/21 123 lb (55.8 kg)  08/18/20 135 lb (61.2 kg)    Physical Exam Constitutional:      General: She is not in acute distress.    Appearance: Normal appearance. She is well-developed.  HENT:     Head: Normocephalic.     Right Ear: External ear normal.     Left Ear: External ear normal.     Nose: Nose normal.  Eyes:     General:        Right eye: No discharge.        Left eye: No discharge.     Conjunctiva/sclera: Conjunctivae normal.     Pupils: Pupils are equal, round, and reactive to light.  Neck:     Thyroid: No thyromegaly.  Vascular: No JVD.     Trachea: No tracheal deviation.  Cardiovascular:     Rate and Rhythm: Normal rate and regular rhythm.     Heart sounds: Normal heart sounds.  Pulmonary:     Effort: No respiratory distress.     Breath sounds: No stridor. Rhonchi present. No wheezing or rales.  Abdominal:     General: Bowel sounds are normal. There is no distension.     Palpations: Abdomen is soft. There is no mass.     Tenderness: There is no abdominal tenderness. There is no guarding or rebound.  Musculoskeletal:        General: No tenderness.     Cervical back: Normal range of motion and neck supple. No rigidity.  Lymphadenopathy:     Cervical: No cervical adenopathy.  Skin:    Findings: No erythema or rash.   Neurological:     Mental Status: She is oriented to person, place, and time.     Cranial Nerves: No cranial nerve deficit.     Motor: No abnormal muscle tone.     Coordination: Coordination normal.     Deep Tendon Reflexes: Reflexes normal.  Psychiatric:        Behavior: Behavior normal.        Thought Content: Thought content normal.        Judgment: Judgment normal.    Lab Results  Component Value Date   WBC 8.4 08/07/2020   HGB 10.5 (L) 08/07/2020   HCT 32.3 (L) 08/07/2020   PLT 270 08/07/2020   GLUCOSE 117 (H) 08/07/2020   CHOL 214 (H) 05/29/2019   TRIG 70.0 05/29/2019   HDL 70.90 05/29/2019   LDLDIRECT 121.4 12/02/2010   LDLCALC 129 (H) 05/29/2019   ALT 15 05/29/2019   AST 18 05/29/2019   NA 135 08/07/2020   K 3.0 (L) 08/07/2020   CL 103 08/07/2020   CREATININE 1.50 (H) 08/07/2020   BUN 27 (H) 08/07/2020   CO2 21 (L) 08/07/2020   TSH 1.43 05/29/2019   HGBA1C 5.9 05/29/2019    DG Chest Portable 1 View  Result Date: 08/07/2020 CLINICAL DATA:  Weakness EXAM: PORTABLE CHEST 1 VIEW COMPARISON:  12/07/2017 chest radiograph and prior. FINDINGS: The heart size and mediastinal contours are within normal limits. Both lungs are clear. No pneumothorax or pleural effusion. No acute osseous abnormality. IMPRESSION: No focal airspace disease. Electronically Signed   By: Primitivo Gauze M.D.   On: 08/07/2020 08:53    Assessment & Plan:   Problem List Items Addressed This Visit     COPD (chronic obstructive pulmonary disease) ([place])    In a smoker Exacerbation: given Medrol pack, po abx Depo-medrol 80 mg IM Hycodan po prn Ceftin po x 7 d      Relevant Medications   HYDROcodone bit-homatropine (HYCODAN) 5-1.5 MG/5ML syrup   methylPREDNISolone (MEDROL DOSEPAK) 4 MG TBPK tablet   COPD exacerbation ([place])    In a smoker Exacerbation: given Medrol pack, po abx Depo-medrol 80 mg IM      Relevant Medications   HYDROcodone bit-homatropine (HYCODAN) 5-1.5 MG/5ML syrup    methylPREDNISolone (MEDROL DOSEPAK) 4 MG TBPK tablet   Smoker    1/2 PPD Discussed      UTI (urinary tract infection) - Primary    Ceftin po UA      Relevant Medications   cefUROXime (CEFTIN) 500 MG tablet   Other Relevant Orders   POCT Urinalysis Dipstick (Completed)      Meds ordered this  encounter  Medications   cefUROXime (CEFTIN) 500 MG tablet    Sig: Take 1 tablet (500 mg total) by mouth 2 (two) times daily with a meal.    Dispense:  14 tablet    Refill:  0   HYDROcodone bit-homatropine (HYCODAN) 5-1.5 MG/5ML syrup    Sig: Take 5 mLs by mouth every 8 (eight) hours as needed for cough.    Dispense:  240 mL    Refill:  0   methylPREDNISolone (MEDROL DOSEPAK) 4 MG TBPK tablet    Sig: Follow package instructions    Dispense:  21 tablet    Refill:  0   methylPREDNISolone acetate (DEPO-MEDROL) injection 80 mg      Follow-up: Return in about 4 weeks (around 01/03/2022) for a follow-up visit.  Walker Kehr, MD

## 2021-12-06 NOTE — Assessment & Plan Note (Signed)
In a smoker Exacerbation: given Medrol pack, po abx Depo-medrol 80 mg IM

## 2021-12-06 NOTE — Assessment & Plan Note (Addendum)
In a smoker Exacerbation: given Medrol pack, po abx Depo-medrol 80 mg IM Hycodan po prn Ceftin po x 7 d

## 2021-12-06 NOTE — Assessment & Plan Note (Signed)
Ceftin po UA

## 2021-12-07 ENCOUNTER — Telehealth: Payer: Self-pay | Admitting: Internal Medicine

## 2021-12-07 NOTE — Telephone Encounter (Signed)
Patient states medication cefUROXime (CEFTIN) 500 MG tablet prescribed at 12-06-2021 ov is making her vomit  Patient is requesting an alternative medication  Pharmacy [facility] [place] [address] [place], [place] - [address]

## 2021-12-08 ENCOUNTER — Other Ambulatory Visit (HOSPITAL_COMMUNITY): Payer: Self-pay

## 2021-12-08 MED ORDER — CIPROFLOXACIN HCL 500 MG PO TABS
500.0000 mg | ORAL_TABLET | Freq: Two times a day (BID) | ORAL | 0 refills | Status: DC
  Filled 2021-12-08: qty 14, 7d supply, fill #0

## 2021-12-08 NOTE — Telephone Encounter (Signed)
Noted.  Discontinue Ceftin.  Start Cipro.  Thanks

## 2021-12-10 ENCOUNTER — Ambulatory Visit: Payer: Medicare Other | Admitting: Internal Medicine

## 2021-12-13 ENCOUNTER — Other Ambulatory Visit (HOSPITAL_COMMUNITY): Payer: Self-pay

## 2021-12-13 ENCOUNTER — Other Ambulatory Visit: Payer: Self-pay | Admitting: Internal Medicine

## 2021-12-14 ENCOUNTER — Other Ambulatory Visit (HOSPITAL_COMMUNITY): Payer: Self-pay

## 2021-12-15 ENCOUNTER — Other Ambulatory Visit: Payer: Self-pay | Admitting: Internal Medicine

## 2021-12-15 ENCOUNTER — Other Ambulatory Visit (HOSPITAL_COMMUNITY): Payer: Self-pay

## 2021-12-15 MED ORDER — AMPHETAMINE-DEXTROAMPHETAMINE 30 MG PO TABS
30.0000 mg | ORAL_TABLET | Freq: Two times a day (BID) | ORAL | 0 refills | Status: DC
  Filled 2021-12-15: qty 60, 30d supply, fill #0

## 2021-12-16 ENCOUNTER — Other Ambulatory Visit (HOSPITAL_COMMUNITY): Payer: Self-pay

## 2021-12-24 ENCOUNTER — Ambulatory Visit: Payer: Medicare Other | Admitting: Internal Medicine

## 2022-01-07 ENCOUNTER — Other Ambulatory Visit: Payer: Self-pay

## 2022-01-07 ENCOUNTER — Other Ambulatory Visit: Payer: Self-pay | Admitting: Internal Medicine

## 2022-01-07 ENCOUNTER — Other Ambulatory Visit (HOSPITAL_COMMUNITY): Payer: Self-pay

## 2022-01-07 ENCOUNTER — Encounter: Payer: Self-pay | Admitting: Internal Medicine

## 2022-01-07 ENCOUNTER — Ambulatory Visit (INDEPENDENT_AMBULATORY_CARE_PROVIDER_SITE_OTHER): Payer: Medicare Other | Admitting: Internal Medicine

## 2022-01-07 VITALS — BP 152/96 | HR 61 | Temp 98.1°F | Ht 64.5 in | Wt 125.5 lb

## 2022-01-07 DIAGNOSIS — G8929 Other chronic pain: Secondary | ICD-10-CM | POA: Diagnosis not present

## 2022-01-07 DIAGNOSIS — N390 Urinary tract infection, site not specified: Secondary | ICD-10-CM | POA: Insufficient documentation

## 2022-01-07 DIAGNOSIS — F17200 Nicotine dependence, unspecified, uncomplicated: Secondary | ICD-10-CM | POA: Diagnosis not present

## 2022-01-07 DIAGNOSIS — R739 Hyperglycemia, unspecified: Secondary | ICD-10-CM | POA: Diagnosis not present

## 2022-01-07 DIAGNOSIS — Z0001 Encounter for general adult medical examination with abnormal findings: Secondary | ICD-10-CM

## 2022-01-07 DIAGNOSIS — D509 Iron deficiency anemia, unspecified: Secondary | ICD-10-CM

## 2022-01-07 DIAGNOSIS — I10 Essential (primary) hypertension: Secondary | ICD-10-CM

## 2022-01-07 DIAGNOSIS — E611 Iron deficiency: Secondary | ICD-10-CM | POA: Diagnosis not present

## 2022-01-07 DIAGNOSIS — M545 Low back pain, unspecified: Secondary | ICD-10-CM | POA: Diagnosis not present

## 2022-01-07 DIAGNOSIS — E559 Vitamin D deficiency, unspecified: Secondary | ICD-10-CM

## 2022-01-07 LAB — IBC PANEL
Iron: 31 ug/dL — ABNORMAL LOW (ref 42–145)
Saturation Ratios: 9.2 % — ABNORMAL LOW (ref 20.0–50.0)
TIBC: 336 ug/dL (ref 250.0–450.0)
Transferrin: 240 mg/dL (ref 212.0–360.0)

## 2022-01-07 LAB — FERRITIN: Ferritin: 37.7 ng/mL (ref 10.0–291.0)

## 2022-01-07 MED ORDER — ALPRAZOLAM 0.5 MG PO TABS
0.5000 mg | ORAL_TABLET | Freq: Two times a day (BID) | ORAL | 5 refills | Status: DC | PRN
  Filled 2022-01-07 – 2022-02-14 (×3): qty 60, 30d supply, fill #0
  Filled 2022-05-11: qty 60, 30d supply, fill #1

## 2022-01-07 MED ORDER — DICLOFENAC SODIUM 75 MG PO TBEC
75.0000 mg | DELAYED_RELEASE_TABLET | Freq: Two times a day (BID) | ORAL | 0 refills | Status: DC
  Filled 2022-01-07: qty 60, 30d supply, fill #0

## 2022-01-07 MED ORDER — AMPHETAMINE-DEXTROAMPHETAMINE 30 MG PO TABS
30.0000 mg | ORAL_TABLET | Freq: Two times a day (BID) | ORAL | 0 refills | Status: DC
  Filled 2022-01-07 – 2022-01-13 (×2): qty 60, 30d supply, fill #0

## 2022-01-07 MED ORDER — CEFTRIAXONE SODIUM 1 G IJ SOLR
1.0000 g | Freq: Once | INTRAMUSCULAR | Status: AC
  Administered 2022-01-07: 1 g via INTRAMUSCULAR

## 2022-01-07 MED ORDER — IRBESARTAN 150 MG PO TABS
150.0000 mg | ORAL_TABLET | Freq: Every day | ORAL | 3 refills | Status: DC
  Filled 2022-01-07: qty 90, 90d supply, fill #0
  Filled 2022-04-12: qty 90, 90d supply, fill #1

## 2022-01-07 MED ORDER — LEVOFLOXACIN 500 MG PO TABS
500.0000 mg | ORAL_TABLET | Freq: Every day | ORAL | 0 refills | Status: AC
  Filled 2022-01-07: qty 10, 10d supply, fill #0

## 2022-01-07 MED ORDER — POLYSACCHARIDE IRON COMPLEX 150 MG PO CAPS: 150.0000 mg | ORAL_CAPSULE | Freq: Every day | ORAL | 1 refills | Status: DC

## 2022-01-07 NOTE — Assessment & Plan Note (Signed)
Also for referral urology 

## 2022-01-07 NOTE — Assessment & Plan Note (Signed)
Recent onset, for rocephin 1 gm IM, for levaquin course asd, for urine cultures,  to f/u any worsening symptoms or concerns

## 2022-01-07 NOTE — Assessment & Plan Note (Signed)
Mild chronic recurrent, liekly underlying djd.ddd - for diclofenac asd,  to f/u any worsening symptoms or concerns

## 2022-01-07 NOTE — Assessment & Plan Note (Signed)
Last vitamin D Lab Results  Component Value Date   VD25OH 43.52 05/29/2019   Stable, cont oral replacement 
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
## 2022-01-07 NOTE — Patient Instructions (Addendum)
You had the antibiotic shot today  Please take all new medication as prescribed - the levaquin pill antibiotic, the avapro for blood pressure, and the diclofenac 75 mg for pain  Please continue all other medications as before, and refills have been done if requested - the adderal and xanax  Ok to stop the amlodipine as you have  Please have the pharmacy call with any other refills you may need.  Please continue your efforts at being more active, low cholesterol diet, and weight control.  You are otherwise up to date with prevention measures today.  Please keep your appointments with your specialists as you may have planned  You will be contacted regarding the referral for: Urology  Please go to the LAB at the blood drawing area for the tests to be done  You will be contacted by phone if any changes need to be made immediately.  Otherwise, you will receive a letter about your results with an explanation, but please check with MyChart first.  Please remember to sign up for MyChart if you have not done so, as this will be important to you in the future with finding out test results, communicating by private email, and scheduling acute appointments online when needed.  Please make an Appointment to return in 6 months, or sooner if needed

## 2022-01-07 NOTE — Assessment & Plan Note (Addendum)
Age and sex appropriate education and counseling updated with regular exercise and diet Referrals for preventative services - none needed Immunizations addressed - declines tdap and shingrix, Smoking counseling  - counseled to quit, pt not ready Evidence for depression or other mood disorder - chronic anxiety, stable Most recent labs reviewed. I have personally reviewed and have noted: 1) the patient's medical and social history 2) The patient's current medications and supplements 3) The patient's height, weight, and BMI have been recorded in the chart

## 2022-01-07 NOTE — Assessment & Plan Note (Signed)
BP Readings from Last 3 Encounters:  01/07/22 (!) 152/96  12/06/21 112/82  06/24/21 124/80   Uncontrolled after pt quit amlodipien and declines restart - for start avapro 150 qd

## 2022-01-07 NOTE — Assessment & Plan Note (Signed)
Pt counsled to quit, pt not ready °

## 2022-01-07 NOTE — Assessment & Plan Note (Signed)
Lab Results  Component Value Date   HGBA1C 5.9 05/29/2019   Stable, pt to continue current medical treatment  - diet

## 2022-01-08 LAB — URINE CULTURE

## 2022-01-11 DIAGNOSIS — L249 Irritant contact dermatitis, unspecified cause: Secondary | ICD-10-CM | POA: Diagnosis not present

## 2022-01-11 DIAGNOSIS — L988 Other specified disorders of the skin and subcutaneous tissue: Secondary | ICD-10-CM | POA: Diagnosis not present

## 2022-01-11 DIAGNOSIS — K130 Diseases of lips: Secondary | ICD-10-CM | POA: Diagnosis not present

## 2022-01-12 ENCOUNTER — Other Ambulatory Visit (HOSPITAL_COMMUNITY): Payer: Self-pay

## 2022-01-12 MED ORDER — NYSTATIN 100000 UNIT/GM EX CREA
TOPICAL_CREAM | CUTANEOUS | 1 refills | Status: DC
  Filled 2022-01-12 – 2022-01-14 (×2): qty 30, 30d supply, fill #0

## 2022-01-12 MED ORDER — FLUTICASONE PROPIONATE 0.005 % EX OINT
TOPICAL_OINTMENT | CUTANEOUS | 1 refills | Status: DC
  Filled 2022-01-12: qty 30, 30d supply, fill #0
  Filled 2022-01-14: qty 30, 15d supply, fill #0

## 2022-01-13 ENCOUNTER — Other Ambulatory Visit (HOSPITAL_COMMUNITY): Payer: Self-pay

## 2022-01-13 ENCOUNTER — Telehealth: Payer: Self-pay

## 2022-01-13 NOTE — Telephone Encounter (Signed)
Pt called to get results. I advised pt of Dr. Jonny Ruiz note that states The test results show that your current treatment is OK, except the iron lab results are mild low.  Please take an iron supplement I will send to the pharmacy, and we should refer you to Gastroenterology to check on why the iron is low.  You should hopefully hear soon. Also Urine Cx is negative  FYI

## 2022-01-14 ENCOUNTER — Other Ambulatory Visit (HOSPITAL_COMMUNITY): Payer: Self-pay

## 2022-01-17 ENCOUNTER — Other Ambulatory Visit (HOSPITAL_COMMUNITY): Payer: Self-pay

## 2022-01-18 ENCOUNTER — Other Ambulatory Visit (HOSPITAL_COMMUNITY): Payer: Self-pay

## 2022-01-18 ENCOUNTER — Other Ambulatory Visit (HOSPITAL_BASED_OUTPATIENT_CLINIC_OR_DEPARTMENT_OTHER): Payer: Self-pay

## 2022-01-20 ENCOUNTER — Other Ambulatory Visit (HOSPITAL_COMMUNITY): Payer: Self-pay

## 2022-01-21 ENCOUNTER — Other Ambulatory Visit (HOSPITAL_COMMUNITY): Payer: Self-pay

## 2022-01-26 ENCOUNTER — Other Ambulatory Visit (HOSPITAL_COMMUNITY): Payer: Self-pay

## 2022-01-27 ENCOUNTER — Other Ambulatory Visit (HOSPITAL_COMMUNITY): Payer: Self-pay

## 2022-02-14 ENCOUNTER — Other Ambulatory Visit (HOSPITAL_COMMUNITY): Payer: Self-pay

## 2022-02-16 ENCOUNTER — Telehealth: Payer: Self-pay | Admitting: Internal Medicine

## 2022-02-16 ENCOUNTER — Other Ambulatory Visit (HOSPITAL_COMMUNITY): Payer: Self-pay

## 2022-02-16 MED ORDER — AMPHETAMINE-DEXTROAMPHETAMINE 30 MG PO TABS
30.0000 mg | ORAL_TABLET | Freq: Two times a day (BID) | ORAL | 0 refills | Status: DC
Start: 2022-02-16 — End: 2022-03-26
  Filled 2022-02-16: qty 60, 30d supply, fill #0

## 2022-02-16 NOTE — Telephone Encounter (Signed)
1.Medication Requested: amphetamine-dextroamphetamine (ADDERALL) 30 MG tablet ? ?2. Pharmacy (Name, Street, [place]): [facility] ? ?3. On Med List: Y ? ?4. Last Visit with PCP: 01-07-2022 ? ?5. Next visit date with PCP: 07-08-2022 ? ? ?Agent: Please be advised that RX refills may take up to 3 business days. We ask that you follow-up with your pharmacy.  ?

## 2022-02-17 ENCOUNTER — Other Ambulatory Visit (HOSPITAL_COMMUNITY): Payer: Self-pay

## 2022-03-10 ENCOUNTER — Encounter: Payer: Self-pay | Admitting: Internal Medicine

## 2022-03-10 ENCOUNTER — Other Ambulatory Visit: Payer: Self-pay | Admitting: Internal Medicine

## 2022-03-10 ENCOUNTER — Ambulatory Visit (INDEPENDENT_AMBULATORY_CARE_PROVIDER_SITE_OTHER): Payer: Medicare Other | Admitting: Internal Medicine

## 2022-03-10 VITALS — BP 164/92 | HR 74 | Temp 98.0°F | Ht 64.5 in | Wt 125.0 lb

## 2022-03-10 DIAGNOSIS — F17200 Nicotine dependence, unspecified, uncomplicated: Secondary | ICD-10-CM | POA: Diagnosis not present

## 2022-03-10 DIAGNOSIS — J309 Allergic rhinitis, unspecified: Secondary | ICD-10-CM

## 2022-03-10 DIAGNOSIS — R1030 Lower abdominal pain, unspecified: Secondary | ICD-10-CM | POA: Diagnosis not present

## 2022-03-10 DIAGNOSIS — J438 Other emphysema: Secondary | ICD-10-CM | POA: Diagnosis not present

## 2022-03-10 DIAGNOSIS — N390 Urinary tract infection, site not specified: Secondary | ICD-10-CM | POA: Diagnosis not present

## 2022-03-10 DIAGNOSIS — R300 Dysuria: Secondary | ICD-10-CM | POA: Diagnosis not present

## 2022-03-10 DIAGNOSIS — R310 Gross hematuria: Secondary | ICD-10-CM | POA: Diagnosis not present

## 2022-03-10 DIAGNOSIS — N179 Acute kidney failure, unspecified: Secondary | ICD-10-CM

## 2022-03-10 LAB — BASIC METABOLIC PANEL
BUN: 35 mg/dL — ABNORMAL HIGH (ref 6–23)
CO2: 25 mEq/L (ref 19–32)
Calcium: 9.2 mg/dL (ref 8.4–10.5)
Chloride: 103 mEq/L (ref 96–112)
Creatinine, Ser: 2.2 mg/dL — ABNORMAL HIGH (ref 0.40–1.20)
GFR: 22.13 mL/min — ABNORMAL LOW (ref >60.00)
Glucose, Bld: 95 mg/dL (ref 70–99)
Potassium: 4.3 mEq/L (ref 3.5–5.1)
Sodium: 135 mEq/L (ref 135–145)

## 2022-03-10 LAB — CBC WITH DIFFERENTIAL/PLATELET
Basophils Absolute: 0.1 10*3/uL (ref 0.0–0.1)
Basophils Relative: 0.8 % (ref 0.0–3.0)
Eosinophils Absolute: 0.4 10*3/uL (ref 0.0–0.7)
Eosinophils Relative: 4.4 % (ref 0.0–5.0)
HCT: 33.9 % — ABNORMAL LOW (ref 36.0–46.0)
Hemoglobin: 11.1 g/dL — ABNORMAL LOW (ref 12.0–15.0)
Lymphocytes Relative: 22.4 % (ref 12.0–46.0)
Lymphs Abs: 1.8 10*3/uL (ref 0.7–4.0)
MCHC: 32.8 g/dL (ref 30.0–36.0)
MCV: 84.9 fl (ref 78.0–100.0)
Monocytes Absolute: 0.7 10*3/uL (ref 0.1–1.0)
Monocytes Relative: 9.4 % (ref 3.0–12.0)
Neutro Abs: 5 10*3/uL (ref 1.4–7.7)
Neutrophils Relative %: 63 % (ref 43.0–77.0)
Platelets: 375 10*3/uL (ref 150.0–400.0)
RBC: 3.99 Mil/uL (ref 3.87–5.11)
RDW: 13.9 % (ref 11.5–15.5)
WBC: 8 10*3/uL (ref 4.0–10.5)

## 2022-03-10 LAB — URINALYSIS, ROUTINE W REFLEX MICROSCOPIC
Bilirubin Urine: NEGATIVE
Ketones, ur: NEGATIVE
Nitrite: NEGATIVE
Specific Gravity, Urine: 1.015 (ref 1.000–1.030)
Total Protein, Urine: 100 — AB
Urine Glucose: NEGATIVE
Urobilinogen, UA: 0.2 (ref 0.0–1.0)
pH: 6 (ref 5.0–8.0)

## 2022-03-10 LAB — HEPATIC FUNCTION PANEL
ALT: 10 U/L (ref 0–35)
AST: 14 U/L (ref 0–37)
Albumin: 3.8 g/dL (ref 3.5–5.2)
Alkaline Phosphatase: 74 U/L (ref 39–117)
Bilirubin, Direct: 0 mg/dL (ref 0.0–0.3)
Total Bilirubin: 0.3 mg/dL (ref 0.2–1.2)
Total Protein: 7.2 g/dL (ref 6.0–8.3)

## 2022-03-10 LAB — LIPASE: Lipase: 24 U/L (ref 11.0–59.0)

## 2022-03-10 MED ORDER — CIPROFLOXACIN HCL 500 MG PO TABS: 500.0000 mg | ORAL_TABLET | Freq: Two times a day (BID) | ORAL | 0 refills | Status: AC

## 2022-03-10 MED ORDER — PREDNISONE 10 MG PO TABS: ORAL_TABLET | ORAL | 0 refills | Status: DC

## 2022-03-10 MED ORDER — CEFTRIAXONE SODIUM 1 G IJ SOLR
1.0000 g | Freq: Once | INTRAMUSCULAR | Status: AC
  Administered 2022-03-10: 1 g via INTRAMUSCULAR

## 2022-03-10 MED ORDER — NITROFURANTOIN MACROCRYSTAL 50 MG PO CAPS
ORAL_CAPSULE | ORAL | 3 refills | Status: DC
Start: 2022-03-10 — End: 2022-06-17

## 2022-03-10 NOTE — Progress Notes (Signed)
Patient ID: Kathy HaberChristine Vargas, female   DOB: September 23, 1951, 71 y.o.   MRN: [id number] ? ? ? ?    Chief Complaint: follow up dysuria, gross hematuria, allergies, copd., smoking, recurrent uti ? ?     HPI:  Kathy HaberChristine Vargas is a 71 y.o. 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  Also incidentally, Does have several wks ongoing nasal allergy symptoms with clearish congestion, itch and sneezing, without fever, pain, ST, cough, swelling or wheezing.  Pt denies chest pain, increased sob or doe, wheezing, orthopnea, PND, increased LE swelling, palpitations, dizziness or syncope.  Still smoking, no ready to quit.   ?      ?Wt Readings from Last 3 Encounters:  ?03/10/22 125 lb (56.7 kg)  ?01/07/22 125 lb 8 oz (56.9 kg)  ?06/24/21 121 lb (54.9 kg)  ? ?BP Readings from Last 3 Encounters:  ?03/10/22 (!) 164/92  ?01/07/22 (!) 152/96  ?12/06/21 112/82  ? ?      ?Past Medical History:  ?Diagnosis Date  ? ABDOMINAL TENDERNESS 01/21/2011  ? ACUTE GINGIVITIS NONPLAQUE INDUCED 06/11/2010  ? ADD 09/05/2008  ? Alcohol abuse, in remission 09/27/2007  ? ALLERGIC RHINITIS 03/02/2009  ? ANXIETY 07/02/2007  ? BACK PAIN 05/19/2009  ? CHRONIC OBSTRUCTIVE PULMONARY DISEASE, ACUTE EXACERBATION 08/06/2009  ? COPD 07/02/2007  ? DEGENERATIVE JOINT DISEASE, CERVICAL SPINE 07/02/2007  ? DEPRESSION 07/02/2007  ? GASTROENTERITIS, VIRAL 11/29/2010  ? GERD 01/21/2011  ? Headache(784.0) 07/02/2007  ? HYPOTHYROIDISM 07/02/2007  ? Lumbar disc disease   ? Lumbar pain with radiation down right leg 06/01/2011  ? OSTEOARTHRITIS 07/02/2007  ? Other and unspecified ovarian cyst 05/19/2009  ? PAIN, CHRONIC, DUE TO TRAUMA 07/02/2007  ? PELVIC  PAIN 03/29/2010  ? UTI 05/19/2009  ? Vitamin D insufficiency   ? ?Past Surgical History:  ?Procedure Laterality Date  ? ABDOMINAL HYSTERECTOMY    ?  CHOLECYSTECTOMY    ? ? reports that she has been smoking cigarettes. She started smoking about 59 years ago. She has a 28.00 pack-year smoking history. She has never used smokeless tobacco. She reports that she does not drink alcohol and does not use drugs. ?family history includes Alcohol abuse in an other family member; Anxiety disorder in an other family member; Coronary artery disease in an other family member; Depression in an other family member; Stroke in an other family member. ?Allergies  ?Allergen Reactions  ? Morphine Nausea And Vomiting  ? Ceftin [Cefuroxime]   ?  Nausea and vomiting  ? ?Current Outpatient Medications on File Prior to Visit  ?Medication Sig Dispense Refill  ? albuterol (VENTOLIN HFA) 108 (90 Base) MCG/ACT inhaler Inhale 2 puffs into the lungs every 6 (six) hours as needed for wheezing or shortness of breath. 18 g 11  ? ALPRAZolam (XANAX) 0.5 MG tablet Take 1 tablet (0.5 mg total) by mouth 2 (two) times daily as needed. 60 tablet 5  ? amphetamine-dextroamphetamine (ADDERALL) 30 MG tablet Take 1 tablet by mouth 2 (two) times daily. 60 tablet 0  ? cetirizine (ZYRTEC) 10 MG tablet Take 10 mg by mouth daily.    ? diclofenac (VOLTAREN) 75 MG EC tablet Take 1 tablet (75 mg total) by mouth 2 (two) times daily. 60 tablet 0  ? fluticasone (CUTIVATE) 0.005 % ointment  Apply to the skin 2 times daily if needed 30 g 1  ? irbesartan (AVAPRO) 150 MG tablet Take 1 tablet (150 mg total) by mouth daily. 90 tablet 3  ? iron polysaccharides (NU-IRON) 150 MG capsule Take 1 capsule (150 mg total) by mouth daily. 90 capsule 1  ? nystatin cream (MYCOSTATIN) Apply to lips once a day 30 g 1  ? pantoprazole (PROTONIX) 40 MG tablet Take 1 tablet (40 mg total) by mouth daily. 90 tablet 2  ? Fluticasone-Salmeterol (ADVAIR) 250-50 MCG/DOSE AEPB INHALE 1 PUFF INTO THE LUNGS 2 (TWO) TIMES DAILY. 360 each 3  ? [DISCONTINUED] FLUoxetine (PROZAC) 20 MG capsule Take 1 capsule (20 mg total) by mouth daily. 30 capsule 11   ? ?No current facility-administered medications on file prior to visit.  ? ?     ROS:  All others reviewed and negative. ? ?Objective  ? ?     PE:  BP (!) 164/92 (BP Location: Right Arm, Patient Position: Sitting, Cuff Size: Normal)   Pulse 74   Temp 98 ?F (36.7 ?C) (Oral)   Ht 5' 4.5" (1.638 m)   Wt 125 lb (56.7 kg)   SpO2 97%   BMI 21.12 kg/m?  ? ?              Constitutional: Pt appears mild to mod ill ?              HENT: Head: NCAT.  ?              Right Ear: External ear normal.   ?              Left Ear: External ear normal.  ?              Eyes: . Pupils are equal, round, and reactive to light. Conjunctivae and EOM are normal ?              Nose: without d/c or deformity ?              Neck: Neck supple. Gross normal ROM ?              Cardiovascular: Normal rate and regular rhythm.   ?              Pulmonary/Chest: Effort normal and breath sounds without rales or wheezing.  ?              Abd:  Soft, mod tender low mid abd in the suprapubic area, ND, + BS, no organomegaly ?              Neurological: Pt is alert. At baseline orientation, motor grossly intact ?              Skin: Skin is warm. No rashes, no other new lesions, LE edema - none ?              Psychiatric: Pt behavior is normal without agitation  ? ?Micro: none ? ?Cardiac tracings I have personally interpreted today:  none ? ?Pertinent Radiological findings (summarize): none  ? ?Lab Results  ?Component Value Date  ? WBC 8.0 03/10/2022  ? HGB 11.1 (L) 03/10/2022  ? HCT 33.9 (L) 03/10/2022  ? PLT 375.0 03/10/2022  ? GLUCOSE 95 03/10/2022  ? CHOL 214 (H) 05/29/2019  ? TRIG 70.0 05/29/2019  ? HDL 70.90 05/29/2019  ? LDLDIRECT 121.4 12/02/2010  ? LDLCALC 129 (H) 05/29/2019  ? ALT 10 03/10/2022  ? AST  14 03/10/2022  ? NA 135 03/10/2022  ? K 4.3 03/10/2022  ? CL 103 03/10/2022  ? CREATININE 2.20 (H) 03/10/2022  ? BUN 35 (H) 03/10/2022  ? CO2 25 03/10/2022  ? TSH 1.43 05/29/2019  ? HGBA1C 5.9 05/29/2019  ? ?Assessment/Plan:  ?Kathy Vargas is a  71 y.o. White or Caucasian [1] female with  has a past medical history of ABDOMINAL TENDERNESS (01/21/2011), ACUTE GINGIVITIS NONPLAQUE INDUCED (06/11/2010), ADD (09/05/2008), Alcohol abuse, in remission (09/27/2007), ALLERGIC RHINITIS (03/02/2009), ANXIETY (07/02/2007), BACK PAIN (05/19/2009), CHRONIC OBSTRUCTIVE PULMONARY DISEASE, ACUTE EXACERBATION (08/06/2009), COPD (07/02/2007), DEGENERATIVE JOINT DISEASE, CERVICAL SPINE (07/02/2007), DEPRESSION (07/02/2007), GASTROENTERITIS, VIRAL (11/29/2010), GERD (01/21/2011), Headache(784.0) (07/02/2007), HYPOTHYROIDISM (07/02/2007), Lumbar disc disease, Lumbar pain with radiation down right leg (06/01/2011), OSTEOARTHRITIS (07/02/2007), Other and unspecified ovarian cyst (05/19/2009), PAIN, CHRONIC, DUE TO TRAUMA (07/02/2007), PELVIC  PAIN (03/29/2010), UTI (05/19/2009), and Vitamin D insufficiency. ? ?COPD (chronic obstructive pulmonary disease) (HCC) ?Stable today, cont current inhaler  ? ?Gross hematuria ?? Infectious related vs other, for urology referral ? ?Recurrent UTI ?Also for nitrofuantoin 50 qhs after initial tx ? ?UTI (urinary tract infection) ?High likelihood it seems, for rocephin im 1 gm, cipro course and f/u urine culture and labs as ordered ? ?Smoker ?Pt counsled to quit, pt not ready ? ?Allergic rhinitis ?With mild to mod seasonal flare, for prednisone asd,  to f/u any worsening symptoms or concerns ? ?Followup: Return in about 4 months (around 07/08/2022). ? ?Oliver Barre, MD 03/13/2022 8:09 PM ?[facility] ?[facility] ?Internal Medicine ?

## 2022-03-10 NOTE — Patient Instructions (Signed)
You had the antibiotic shot today ? ?Please take all new medication as prescribed - the cipro antibiotic for now, and prednisone ? ?Please take all new medication as prescribed  - the nitrofurantoin antibiotic at bedtime starting Mar 21 2022 or after for prevention ? ?Please continue all other medications as before, and refills have been done if requested. ? ?Please have the pharmacy call with any other refills you may need. ? ?You are otherwise up to date with prevention measures today. ? ?Please keep your appointments with your specialists as you may have planned ? ?Please quit smoking ? ?Please go to the LAB at the blood drawing area for the tests to be done ? ?You will be contacted by phone if any changes need to be made immediately.  Otherwise, you will receive a letter about your results with an explanation, but please check with MyChart first. ? ?Please remember to sign up for MyChart if you have not done so, as this will be important to you in the future with finding out test results, communicating by private email, and scheduling acute appointments online when needed. ? ? ? ? ?

## 2022-03-11 ENCOUNTER — Telehealth: Payer: Self-pay | Admitting: Internal Medicine

## 2022-03-11 LAB — URINE CULTURE

## 2022-03-11 NOTE — Telephone Encounter (Signed)
Pt returning cma call for 03-10-2022 lab results ? ?Informed pt of providers 03-10-2022 result notes and recommendations ? ?Pt verbalized understanding and agreed ?

## 2022-03-13 ENCOUNTER — Encounter: Payer: Self-pay | Admitting: Internal Medicine

## 2022-03-13 NOTE — Assessment & Plan Note (Signed)
With mild to mod seasonal flare, for prednisone asd,  to f/u any worsening symptoms or concerns ?

## 2022-03-13 NOTE — Assessment & Plan Note (Signed)
Stable today, cont current inhaler  ?

## 2022-03-13 NOTE — Assessment & Plan Note (Signed)
Pt counsled to quit, pt not ready °

## 2022-03-13 NOTE — Assessment & Plan Note (Signed)
?   Infectious related vs other, for urology referral ?

## 2022-03-13 NOTE — Assessment & Plan Note (Signed)
High likelihood it seems, for rocephin im 1 gm, cipro course and f/u urine culture and labs as ordered ?

## 2022-03-13 NOTE — Assessment & Plan Note (Signed)
Also for nitrofuantoin 50 qhs after initial tx ?

## 2022-03-17 ENCOUNTER — Other Ambulatory Visit (INDEPENDENT_AMBULATORY_CARE_PROVIDER_SITE_OTHER): Payer: Medicare Other

## 2022-03-17 DIAGNOSIS — N179 Acute kidney failure, unspecified: Secondary | ICD-10-CM

## 2022-03-18 ENCOUNTER — Encounter: Payer: Self-pay | Admitting: Internal Medicine

## 2022-03-18 ENCOUNTER — Telehealth: Payer: Self-pay | Admitting: Internal Medicine

## 2022-03-18 ENCOUNTER — Other Ambulatory Visit: Payer: Self-pay | Admitting: Internal Medicine

## 2022-03-18 DIAGNOSIS — N179 Acute kidney failure, unspecified: Secondary | ICD-10-CM

## 2022-03-18 DIAGNOSIS — R310 Gross hematuria: Secondary | ICD-10-CM

## 2022-03-18 DIAGNOSIS — R1013 Epigastric pain: Secondary | ICD-10-CM

## 2022-03-18 DIAGNOSIS — N1832 Chronic kidney disease, stage 3b: Secondary | ICD-10-CM

## 2022-03-18 DIAGNOSIS — R634 Abnormal weight loss: Secondary | ICD-10-CM

## 2022-03-18 LAB — BASIC METABOLIC PANEL
BUN: 43 mg/dL — ABNORMAL HIGH (ref 6–23)
CO2: 22 mEq/L (ref 19–32)
Calcium: 9.3 mg/dL (ref 8.4–10.5)
Chloride: 103 mEq/L (ref 96–112)
Creatinine, Ser: 2.08 mg/dL — ABNORMAL HIGH (ref 0.40–1.20)
GFR: 23.66 mL/min — ABNORMAL LOW (ref >60.00)
Glucose, Bld: 99 mg/dL (ref 70–99)
Potassium: 4.8 mEq/L (ref 3.5–5.1)
Sodium: 136 mEq/L (ref 135–145)

## 2022-03-18 NOTE — Telephone Encounter (Signed)
none

## 2022-03-22 ENCOUNTER — Telehealth: Payer: Self-pay | Admitting: Internal Medicine

## 2022-03-22 NOTE — Telephone Encounter (Signed)
[facility] called and states they need a new order because the pt creatinine was too high (2.08) and GFR was 23.  ? ?Requesting verbal to change to without creatinine.  ? Juliann Pulse- [phone] ?Fax #- [phone] ?

## 2022-03-22 NOTE — Telephone Encounter (Signed)
Ok for no contrast verbal ?

## 2022-03-22 NOTE — Telephone Encounter (Signed)
Verbals given  

## 2022-03-25 ENCOUNTER — Telehealth: Payer: Self-pay

## 2022-03-25 ENCOUNTER — Encounter: Payer: Self-pay | Admitting: Internal Medicine

## 2022-03-25 ENCOUNTER — Other Ambulatory Visit: Payer: Self-pay | Admitting: Internal Medicine

## 2022-03-25 ENCOUNTER — Ambulatory Visit
Admission: RE | Admit: 2022-03-25 | Discharge: 2022-03-25 | Disposition: A | Discharge status: Home / Self Care | Payer: Medicare Other | Source: Ambulatory Visit | Attending: Internal Medicine | Admitting: Internal Medicine

## 2022-03-25 DIAGNOSIS — N132 Hydronephrosis with renal and ureteral calculous obstruction: Secondary | ICD-10-CM | POA: Diagnosis not present

## 2022-03-25 DIAGNOSIS — N200 Calculus of kidney: Secondary | ICD-10-CM

## 2022-03-25 DIAGNOSIS — N1330 Unspecified hydronephrosis: Secondary | ICD-10-CM

## 2022-03-25 DIAGNOSIS — N1832 Chronic kidney disease, stage 3b: Secondary | ICD-10-CM

## 2022-03-25 NOTE — Telephone Encounter (Signed)
Reviewed renal ultrasound-marked right-sided hydronephrosis and moderate left-sided hydronephrosis.  Recurrent UTIs, decreasing kidney function.  Seen recently and placed on an antibiotic.  She does state that she feels bloated and is having some back pain which is unusual for her other urinary tract infections. ? ?Advised her to go to the emergency room for further evaluation by urology, but she declined. ? ?Discussed that we can try to get her in with urology as an outpatient as quick as possible, but not sure how quickly we will be able to do that and will not be able to attempt that until Monday. ? ?She will go to the emergency room over the weekend and if she feels worse.  I did discuss with her the concerns for worsening kidney function because of the blockage ? ? ? ?Urgent urology referral ordered. ? ?Radiology commented about possible CT scan to get-we will leave that up to Dr. Jonny Ruiz or neurology depending on how quickly we can get her in. ?

## 2022-03-25 NOTE — Telephone Encounter (Signed)
French Ana with radiology called requesting that provider reviews patient's Renal US in Epic. Dr. Jonny Ruiz is out of the office. Could you review in his absence? ?

## 2022-03-26 ENCOUNTER — Other Ambulatory Visit (HOSPITAL_COMMUNITY): Payer: Self-pay

## 2022-03-26 ENCOUNTER — Other Ambulatory Visit: Payer: Self-pay | Admitting: Internal Medicine

## 2022-03-26 MED ORDER — AMPHETAMINE-DEXTROAMPHETAMINE 30 MG PO TABS
30.0000 mg | ORAL_TABLET | Freq: Two times a day (BID) | ORAL | 0 refills | Status: DC
  Filled 2022-03-26: qty 60, 30d supply, fill #0

## 2022-03-28 ENCOUNTER — Other Ambulatory Visit (HOSPITAL_COMMUNITY): Payer: Self-pay

## 2022-03-28 DIAGNOSIS — N202 Calculus of kidney with calculus of ureter: Secondary | ICD-10-CM | POA: Diagnosis not present

## 2022-03-28 DIAGNOSIS — N261 Atrophy of kidney (terminal): Secondary | ICD-10-CM | POA: Diagnosis not present

## 2022-03-28 DIAGNOSIS — N132 Hydronephrosis with renal and ureteral calculous obstruction: Secondary | ICD-10-CM | POA: Diagnosis not present

## 2022-03-28 DIAGNOSIS — I700 Atherosclerosis of aorta: Secondary | ICD-10-CM | POA: Diagnosis not present

## 2022-03-28 DIAGNOSIS — M160 Bilateral primary osteoarthritis of hip: Secondary | ICD-10-CM | POA: Diagnosis not present

## 2022-03-28 DIAGNOSIS — R109 Unspecified abdominal pain: Secondary | ICD-10-CM | POA: Diagnosis not present

## 2022-03-28 MED ORDER — OXYCODONE-ACETAMINOPHEN 5-325 MG PO TABS
ORAL_TABLET | ORAL | 0 refills | Status: DC
  Filled 2022-03-28: qty 16, 4d supply, fill #0

## 2022-03-29 ENCOUNTER — Other Ambulatory Visit: Payer: Self-pay | Admitting: Urology

## 2022-03-31 ENCOUNTER — Other Ambulatory Visit (HOSPITAL_COMMUNITY): Payer: Self-pay

## 2022-03-31 MED ORDER — AMOXICILLIN-POT CLAVULANATE 875-125 MG PO TABS
ORAL_TABLET | ORAL | 0 refills | Status: DC
  Filled 2022-03-31: qty 21, 14d supply, fill #0

## 2022-04-04 ENCOUNTER — Telehealth: Payer: Self-pay | Admitting: Internal Medicine

## 2022-04-04 NOTE — Telephone Encounter (Signed)
Heather from [facility] is reviewing a referral we sent out and would like to know if pt has seen a urologist. Please contact and advise.  ? ?Phone #: [phone] .Marland Kitchen..ext....125 ?

## 2022-04-04 NOTE — Telephone Encounter (Signed)
Called patient to see if she has ever seen a urologist, if so than who? ? ?Voicemail was left. ?

## 2022-04-07 NOTE — Patient Instructions (Addendum)
DUE TO COVID-19 ONLY TWO VISITORS  (aged 71 and older)  ARE ALLOWED TO COME WITH YOU AND STAY IN THE WAITING ROOM ONLY DURING PRE OP AND PROCEDURE.   **NO VISITORS ARE ALLOWED IN THE SHORT STAY AREA OR RECOVERY ROOM!!**   Your procedure is scheduled on: 04/12/22   Report to [facility] Main Entrance    Report to admitting at 7:00 AM   Call this number if you have problems the morning of surgery [phone]   Do not eat food :After Midnight.   After Midnight you may have the following liquids until 6:15 AM DAY OF SURGERY  Water Black Coffee (sugar ok, NO MILK/CREAM OR CREAMERS)  Tea (sugar ok, NO MILK/CREAM OR CREAMERS) regular and decaf                             Plain Jell-O (NO RED)                                           Fruit ices (not with fruit pulp, NO RED)                                     Popsicles (NO RED)                                                                  Juice: apple, WHITE grape, WHITE cranberry Sports drinks like Gatorade (NO RED) Clear broth(vegetable,chicken,beef)              FOLLOW BOWEL PREP AND ANY ADDITIONAL PRE OP INSTRUCTIONS YOU RECEIVED FROM YOUR SURGEON'S OFFICE!!!     Oral Hygiene is also important to reduce your risk of infection.                                    Remember - BRUSH YOUR TEETH THE MORNING OF SURGERY WITH YOUR REGULAR TOOTHPASTE   Do NOT smoke after Midnight   Take these medicines the morning of surgery with A SIP OF WATER: Inhalers, Xanax, Augmentin, Inhalers, Pantoprazole.                               You may not have any metal on your body including hair pins, jewelry, and body piercing             Do not wear make-up, lotions, powders, perfumes, or deodorant  Do not wear nail polish including gel and S&S, artificial/acrylic nails, or any other type of covering on natural nails including finger and toenails. If you have artificial nails, gel coating, etc. that needs to be removed by a nail salon please  have this removed prior to surgery or surgery may need to be canceled/ delayed if the surgeon/ anesthesia feels like they are unable to be safely monitored.   Do not shave  48 hours prior to surgery.    Do not  bring valuables to the hospital. [place] IS NOT             RESPONSIBLE   FOR VALUABLES.   Contacts, dentures or bridgework may not be worn into surgery.    Patients discharged on the day of surgery will not be allowed to drive home.  Someone NEEDS to stay with you for the first 24 hours after anesthesia.              Please read over the following fact sheets you were given: IF YOU HAVE QUESTIONS ABOUT YOUR PRE-OP INSTRUCTIONS PLEASE CALL [phone]- [facility] - Preparing for Surgery Before surgery, you can play an important role.  Because skin is not sterile, your skin needs to be as free of germs as possible.  You can reduce the number of germs on your skin by washing with CHG (chlorahexidine gluconate) soap before surgery.  CHG is an antiseptic cleaner which kills germs and bonds with the skin to continue killing germs even after washing. Please DO NOT use if you have an allergy to CHG or antibacterial soaps.  If your skin becomes reddened/irritated stop using the CHG and inform your nurse when you arrive at Short Stay. Do not shave (including legs and underarms) for at least 48 hours prior to the first CHG shower.  You may shave your face/neck.  Please follow these instructions carefully:  1.  Shower with CHG Soap the night before surgery and the  morning of surgery.  2.  If you choose to wash your hair, wash your hair first as usual with your normal  shampoo.  3.  After you shampoo, rinse your hair and body thoroughly to remove the shampoo.                             4.  Use CHG as you would any other liquid soap.  You can apply chg directly to the skin and wash.  Gently with a scrungie or clean washcloth.  5.  Apply the CHG Soap to your body ONLY FROM THE NECK  DOWN.   Do   not use on face/ open                           Wound or open sores. Avoid contact with eyes, ears mouth and   genitals (private parts).                       Wash face,  Genitals (private parts) with your normal soap.             6.  Wash thoroughly, paying special attention to the area where your    surgery  will be performed.  7.  Thoroughly rinse your body with warm water from the neck down.  8.  DO NOT shower/wash with your normal soap after using and rinsing off the CHG Soap.                9.  Pat yourself dry with a clean towel.            10.  Wear clean pajamas.            11.  Place clean sheets on your bed the night of your first shower and do not  sleep with pets. Day of Surgery : Do not apply any  lotions/deodorants the morning of surgery.  Please wear clean clothes to the hospital/surgery center.  FAILURE TO FOLLOW THESE INSTRUCTIONS MAY RESULT IN THE CANCELLATION OF YOUR SURGERY  PATIENT SIGNATURE_________________________________  NURSE SIGNATURE__________________________________  ________________________________________________________________________

## 2022-04-07 NOTE — Progress Notes (Addendum)
COVID Vaccine Completed: yes x1  Date of COVID positive in last 90 days: no  PCP - Oliver Barre, MD Cardiologist - n/a  Chest x-ray - n/a EKG - 04/08/22 Epic/chart Stress Test - n/a ECHO - n/a Cardiac Cath - n/a Pacemaker/ICD device last checked: n/a Spinal Cord Stimulator: n/a  Bowel Prep - no  Sleep Study - n/a CPAP -   Fasting Blood Sugar - n/a Checks Blood Sugar _____ times a day  Blood Thinner Instructions: n/a Aspirin Instructions: Last Dose:  Activity level: Can go up a flight of stairs and perform activities of daily living without stopping and without symptoms of chest pain or shortness of breath.       Anesthesia review: creatinine 2.15, HTN, COPD  Patient denies shortness of breath, fever, cough and chest pain at PAT appointment   Patient verbalized understanding of instructions that were given to them at the PAT appointment. Patient was also instructed that they will need to review over the PAT instructions again at home before surgery.

## 2022-04-08 ENCOUNTER — Encounter (HOSPITAL_COMMUNITY): Payer: Self-pay

## 2022-04-08 ENCOUNTER — Encounter (HOSPITAL_COMMUNITY)
Admission: RE | Admit: 2022-04-08 | Discharge: 2022-04-08 | Disposition: A | Discharge status: Home / Self Care | Payer: Medicare Other | Source: Ambulatory Visit | Attending: Urology | Admitting: Urology

## 2022-04-08 VITALS — BP 151/90 | HR 69 | Temp 97.8°F | Resp 14 | Ht 64.5 in | Wt 123.0 lb

## 2022-04-08 DIAGNOSIS — I10 Essential (primary) hypertension: Secondary | ICD-10-CM | POA: Insufficient documentation

## 2022-04-08 DIAGNOSIS — Z01818 Encounter for other preprocedural examination: Secondary | ICD-10-CM | POA: Insufficient documentation

## 2022-04-08 HISTORY — DX: Essential (primary) hypertension: I10

## 2022-04-08 HISTORY — DX: Anemia, unspecified: D64.9

## 2022-04-08 HISTORY — DX: Personal history of urinary calculi: Z87.442

## 2022-04-08 LAB — BASIC METABOLIC PANEL
Anion gap: 8 (ref 5–15)
BUN: 35 mg/dL — ABNORMAL HIGH (ref 8–23)
CO2: 22 mmol/L (ref 22–32)
Calcium: 9.2 mg/dL (ref 8.9–10.3)
Chloride: 108 mmol/L (ref 98–111)
Creatinine, Ser: 2.15 mg/dL — ABNORMAL HIGH (ref 0.44–1.00)
GFR, Estimated: 24 mL/min — ABNORMAL LOW (ref >60)
Glucose, Bld: 99 mg/dL (ref 70–99)
Potassium: 4.4 mmol/L (ref 3.5–5.1)
Sodium: 138 mmol/L (ref 135–145)

## 2022-04-08 LAB — CBC
HCT: 34.1 % — ABNORMAL LOW (ref 36.0–46.0)
Hemoglobin: 11.4 g/dL — ABNORMAL LOW (ref 12.0–15.0)
MCH: 29.2 pg (ref 26.0–34.0)
MCHC: 33.4 g/dL (ref 30.0–36.0)
MCV: 87.2 fL (ref 80.0–100.0)
Platelets: 331 10*3/uL (ref 150–400)
RBC: 3.91 MIL/uL (ref 3.87–5.11)
RDW: 13.4 % (ref 11.5–15.5)
WBC: 6.9 10*3/uL (ref 4.0–10.5)
nRBC: 0 % (ref 0.0–0.2)

## 2022-04-08 NOTE — Progress Notes (Signed)
Creatinine 2.15, results routed to Dr. Lafonda Mosses

## 2022-04-12 ENCOUNTER — Encounter (HOSPITAL_COMMUNITY): Payer: Self-pay | Admitting: Urology

## 2022-04-12 ENCOUNTER — Ambulatory Visit (HOSPITAL_BASED_OUTPATIENT_CLINIC_OR_DEPARTMENT_OTHER): Payer: Medicare Other | Admitting: Certified Registered"

## 2022-04-12 ENCOUNTER — Other Ambulatory Visit (HOSPITAL_COMMUNITY): Payer: Self-pay

## 2022-04-12 ENCOUNTER — Ambulatory Visit (HOSPITAL_COMMUNITY): Payer: Medicare Other | Admitting: Physician Assistant

## 2022-04-12 ENCOUNTER — Ambulatory Visit (HOSPITAL_COMMUNITY)
Admission: RE | Admit: 2022-04-12 | Discharge: 2022-04-12 | Disposition: A | Discharge status: Home / Self Care | Payer: Medicare Other | Attending: Urology | Admitting: Urology

## 2022-04-12 ENCOUNTER — Ambulatory Visit (HOSPITAL_COMMUNITY): Payer: Medicare Other

## 2022-04-12 ENCOUNTER — Encounter (HOSPITAL_COMMUNITY)
Admission: RE | Disposition: A | Discharge status: Home / Self Care | Payer: Self-pay | Source: Home / Self Care | Attending: Urology

## 2022-04-12 DIAGNOSIS — I129 Hypertensive chronic kidney disease with stage 1 through stage 4 chronic kidney disease, or unspecified chronic kidney disease: Secondary | ICD-10-CM

## 2022-04-12 DIAGNOSIS — Z87442 Personal history of urinary calculi: Secondary | ICD-10-CM | POA: Insufficient documentation

## 2022-04-12 DIAGNOSIS — N1330 Unspecified hydronephrosis: Secondary | ICD-10-CM | POA: Diagnosis not present

## 2022-04-12 DIAGNOSIS — E039 Hypothyroidism, unspecified: Secondary | ICD-10-CM | POA: Diagnosis not present

## 2022-04-12 DIAGNOSIS — F17200 Nicotine dependence, unspecified, uncomplicated: Secondary | ICD-10-CM | POA: Insufficient documentation

## 2022-04-12 DIAGNOSIS — K219 Gastro-esophageal reflux disease without esophagitis: Secondary | ICD-10-CM | POA: Insufficient documentation

## 2022-04-12 DIAGNOSIS — Z79899 Other long term (current) drug therapy: Secondary | ICD-10-CM | POA: Diagnosis not present

## 2022-04-12 DIAGNOSIS — N189 Chronic kidney disease, unspecified: Secondary | ICD-10-CM

## 2022-04-12 DIAGNOSIS — N132 Hydronephrosis with renal and ureteral calculous obstruction: Secondary | ICD-10-CM | POA: Insufficient documentation

## 2022-04-12 DIAGNOSIS — I10 Essential (primary) hypertension: Secondary | ICD-10-CM | POA: Insufficient documentation

## 2022-04-12 DIAGNOSIS — D631 Anemia in chronic kidney disease: Secondary | ICD-10-CM

## 2022-04-12 DIAGNOSIS — F17210 Nicotine dependence, cigarettes, uncomplicated: Secondary | ICD-10-CM | POA: Insufficient documentation

## 2022-04-12 DIAGNOSIS — J449 Chronic obstructive pulmonary disease, unspecified: Secondary | ICD-10-CM | POA: Insufficient documentation

## 2022-04-12 DIAGNOSIS — N201 Calculus of ureter: Secondary | ICD-10-CM | POA: Diagnosis not present

## 2022-04-12 HISTORY — PX: URETEROSCOPY WITH HOLMIUM LASER LITHOTRIPSY: SHX6645

## 2022-04-12 SURGERY — URETEROSCOPY, WITH LITHOTRIPSY USING HOLMIUM LASER
Anesthesia: General | Site: Urethra | Laterality: Right

## 2022-04-12 MED ORDER — PROMETHAZINE HCL 25 MG/ML IJ SOLN: 6.2500 mg | INTRAMUSCULAR | Status: DC | PRN

## 2022-04-12 MED ORDER — IOHEXOL 300 MG/ML  SOLN
INTRAMUSCULAR | Status: DC | PRN
  Administered 2022-04-12: 10 mL via URETHRAL

## 2022-04-12 MED ORDER — CIPROFLOXACIN IN D5W 400 MG/200ML IV SOLN
400.0000 mg | Freq: Once | INTRAVENOUS | Status: AC
  Administered 2022-04-12: 400 mg via INTRAVENOUS
  Filled 2022-04-12: qty 200

## 2022-04-12 MED ORDER — CHLORHEXIDINE GLUCONATE 0.12 % MT SOLN
15.0000 mL | Freq: Once | OROMUCOSAL | Status: AC
  Administered 2022-04-12: 15 mL via OROMUCOSAL

## 2022-04-12 MED ORDER — MIDAZOLAM HCL 5 MG/5ML IJ SOLN
INTRAMUSCULAR | Status: DC | PRN
  Administered 2022-04-12: 2 mg via INTRAVENOUS

## 2022-04-12 MED ORDER — OXYCODONE-ACETAMINOPHEN 5-325 MG PO TABS
ORAL_TABLET | ORAL | 0 refills | Status: DC
Start: 2022-04-12 — End: 2022-06-30
  Filled 2022-04-12: qty 16, 4d supply, fill #0

## 2022-04-12 MED ORDER — DEXAMETHASONE SODIUM PHOSPHATE 10 MG/ML IJ SOLN
INTRAMUSCULAR | Status: AC
  Filled 2022-04-12: qty 1

## 2022-04-12 MED ORDER — LABETALOL HCL 5 MG/ML IV SOLN
INTRAVENOUS | Status: DC | PRN
  Administered 2022-04-12: 10 mg via INTRAVENOUS

## 2022-04-12 MED ORDER — SODIUM CHLORIDE 0.9 % IR SOLN
Status: DC | PRN
  Administered 2022-04-12: 1000 mL
  Administered 2022-04-12: 3000 mL via INTRAVESICAL

## 2022-04-12 MED ORDER — FENTANYL CITRATE (PF) 100 MCG/2ML IJ SOLN
INTRAMUSCULAR | Status: DC | PRN
  Administered 2022-04-12: 100 ug via INTRAVENOUS

## 2022-04-12 MED ORDER — EPHEDRINE SULFATE (PRESSORS) 50 MG/ML IJ SOLN
INTRAMUSCULAR | Status: DC | PRN
  Administered 2022-04-12 (×3): 5 mg via INTRAVENOUS

## 2022-04-12 MED ORDER — ORAL CARE MOUTH RINSE: 15.0000 mL | Freq: Once | OROMUCOSAL | Status: AC

## 2022-04-12 MED ORDER — FENTANYL CITRATE (PF) 100 MCG/2ML IJ SOLN
INTRAMUSCULAR | Status: AC
  Filled 2022-04-12: qty 2

## 2022-04-12 MED ORDER — ONDANSETRON HCL 4 MG/2ML IJ SOLN
INTRAMUSCULAR | Status: AC
  Filled 2022-04-12: qty 2

## 2022-04-12 MED ORDER — LIDOCAINE HCL (PF) 2 % IJ SOLN
INTRAMUSCULAR | Status: AC
  Filled 2022-04-12: qty 5

## 2022-04-12 MED ORDER — PROPOFOL 10 MG/ML IV BOLUS
INTRAVENOUS | Status: DC | PRN
  Administered 2022-04-12: 150 mg via INTRAVENOUS

## 2022-04-12 MED ORDER — ACETAMINOPHEN 500 MG PO TABS: 1000.0000 mg | ORAL_TABLET | Freq: Once | ORAL | Status: DC

## 2022-04-12 MED ORDER — PHENAZOPYRIDINE HCL 100 MG PO TABS
200.0000 mg | ORAL_TABLET | Freq: Three times a day (TID) | ORAL | 1 refills | Status: DC | PRN
  Filled 2022-04-12: qty 60, 10d supply, fill #0

## 2022-04-12 MED ORDER — LACTATED RINGERS IV SOLN: INTRAVENOUS | Status: DC

## 2022-04-12 MED ORDER — MIDAZOLAM HCL 2 MG/2ML IJ SOLN
INTRAMUSCULAR | Status: AC
  Filled 2022-04-12: qty 2

## 2022-04-12 MED ORDER — CELECOXIB 100 MG PO CAPS
100.0000 mg | ORAL_CAPSULE | Freq: Two times a day (BID) | ORAL | 0 refills | Status: DC | PRN
  Filled 2022-04-12: qty 30, 15d supply, fill #0

## 2022-04-12 MED ORDER — ONDANSETRON HCL 4 MG/2ML IJ SOLN
INTRAMUSCULAR | Status: DC | PRN
  Administered 2022-04-12: 4 mg via INTRAVENOUS

## 2022-04-12 MED ORDER — LIDOCAINE 20MG/ML (2%) 15 ML SYRINGE OPTIME
INTRAMUSCULAR | Status: DC | PRN
  Administered 2022-04-12: 50 mg via INTRAVENOUS

## 2022-04-12 MED ORDER — FENTANYL CITRATE PF 50 MCG/ML IJ SOSY: 25.0000 ug | PREFILLED_SYRINGE | INTRAMUSCULAR | Status: DC | PRN

## 2022-04-12 MED ORDER — DEXAMETHASONE SODIUM PHOSPHATE 10 MG/ML IJ SOLN
INTRAMUSCULAR | Status: DC | PRN
  Administered 2022-04-12: 10 mg via INTRAVENOUS

## 2022-04-12 SURGICAL SUPPLY — 32 items
APL SKNCLS STERI-STRIP NONHPOA (GAUZE/BANDAGES/DRESSINGS) ×1
BAG COUNTER SPONGE SURGICOUNT (BAG) IMPLANT
BAG SPNG CNTER NS LX DISP (BAG)
BAG URO CATCHER STRL LF (MISCELLANEOUS) ×3 IMPLANT
BASKET ZERO TIP NITINOL 2.4FR (BASKET) IMPLANT
BENZOIN TINCTURE PRP APPL 2/3 (GAUZE/BANDAGES/DRESSINGS) ×1 IMPLANT
BSKT STON RTRVL ZERO TP 2.4FR (BASKET)
CATH URETERAL DUAL LUMEN 10F (MISCELLANEOUS) ×3 IMPLANT
CATH URETL OPEN END 6FR 70 (CATHETERS) ×1 IMPLANT
CLOTH BEACON ORANGE TIMEOUT ST (SAFETY) ×2 IMPLANT
DRSG TEGADERM 2-3/8X2-3/4 SM (GAUZE/BANDAGES/DRESSINGS) ×1 IMPLANT
GLOVE SS BIOGEL STRL SZ 7 (GLOVE) ×2 IMPLANT
GLOVE SUPERSENSE BIOGEL SZ 7 (GLOVE) ×2
GLOVE SURG LX 7.5 STRW (GLOVE) ×1
GLOVE SURG LX STRL 7.5 STRW (GLOVE) ×2 IMPLANT
GOWN STRL REUS W/TWL LRG LVL3 (GOWN DISPOSABLE) ×1 IMPLANT
GOWN STRL REUS W/TWL XL LVL3 (GOWN DISPOSABLE) ×1 IMPLANT
GUIDEWIRE STR DUAL SENSOR (WIRE) ×3 IMPLANT
GUIDEWIRE ZIPWRE .038 STRAIGHT (WIRE) ×3 IMPLANT
IV NS 1000ML (IV SOLUTION) ×2
IV NS 1000ML BAXH (IV SOLUTION) ×2 IMPLANT
KIT TURNOVER KIT A (KITS) ×1 IMPLANT
LASER FIB FLEXIVA PULSE ID 365 (Laser) IMPLANT
MANIFOLD NEPTUNE II (INSTRUMENTS) ×3 IMPLANT
PACK CYSTO (CUSTOM PROCEDURE TRAY) ×3 IMPLANT
SHEATH NAVIGATOR HD 11/13X36 (SHEATH) ×1 IMPLANT
STENT URET 6FRX24 CONTOUR (STENTS) ×1 IMPLANT
SYR 10ML LL (SYRINGE) ×1 IMPLANT
TRACTIP FLEXIVA PULS ID 200XHI (Laser) IMPLANT
TRACTIP FLEXIVA PULSE ID 200 (Laser) ×2
TUBING CONNECTING 10 (TUBING) ×3 IMPLANT
TUBING UROLOGY SET (TUBING) ×3 IMPLANT

## 2022-04-12 NOTE — Transfer of Care (Signed)
Immediate Anesthesia Transfer of Care Note  Patient: Charizma Linko  Procedure(s) Performed: URETEROSCOPY WITH HOLMIUM LASER LITHOTRIPSY, STENT PLACEMENT (Right: Urethra)  Patient Location: PACU  Anesthesia Type:General  Level of Consciousness: awake, sedated and patient cooperative  Airway & Oxygen Therapy: Patient connected to face mask oxygen  Post-op Assessment: Report given to RN and Post -op Vital signs reviewed and stable  Post vital signs: stable  Last Vitals:  Vitals Value Taken Time  BP 162/89 04/12/22 1032  Temp 36.5 C 04/12/22 1032  Pulse 58 04/12/22 1034  Resp 14 04/12/22 1034  SpO2 100 % 04/12/22 1034  Vitals shown include unvalidated device data.  Last Pain:  Vitals:   04/12/22 0754  TempSrc:   PainSc: 5       Patients Stated Pain Goal: 4 ([id number])  Complications: No notable events documented.

## 2022-04-12 NOTE — Anesthesia Postprocedure Evaluation (Signed)
Anesthesia Post Note  Patient: Kathy Vargas  Procedure(s) Performed: URETEROSCOPY WITH HOLMIUM LASER LITHOTRIPSY, STENT PLACEMENT (Right: Urethra)     Patient location during evaluation: PACU Anesthesia Type: General Level of consciousness: awake and alert Pain management: pain level controlled Vital Signs Assessment: post-procedure vital signs reviewed and stable Respiratory status: spontaneous breathing, nonlabored ventilation, respiratory function stable and patient connected to nasal cannula oxygen Cardiovascular status: blood pressure returned to baseline and stable Postop Assessment: no apparent nausea or vomiting Anesthetic complications: no   No notable events documented.  Last Vitals:  Vitals:   04/12/22 1100 04/12/22 1110  BP: (!) 178/87 (!) 153/87  Pulse: (!) 56 (!) 58  Resp: 17 15  Temp: 36.4 C   SpO2: 95% 96%    Last Pain:  Vitals:   04/12/22 1110  TempSrc:   PainSc: 0-No pain                 Tiajuana Amass

## 2022-04-12 NOTE — Discharge Instructions (Signed)
[facility] [phone] Post Ureteroscopy With or Without Stent Instructions  *OK to remove stent by pulling on string on Friday May 26 Definitions:  Ureter: The duct that transports urine from the kidney to the bladder. Stent:   A plastic hollow tube that is placed into the ureter, from the kidney to the bladder to prevent the ureter from swelling shut.  GENERAL INSTRUCTIONS:  Despite the fact that no skin incisions were used, the area around the ureter and bladder is raw and irritated. The stent is a foreign body which will further irritate the bladder wall. This irritation is manifested by increased frequency of urination, both day and night, and by an increase in the urge to urinate. In some, the urge to urinate is present almost always. Sometimes the urge is strong enough that you may not be able to stop yourself from urinating. The only real cure is to remove the stent and then give time for the bladder wall to heal which can't be done until the danger of the ureter swelling shut has passed, which varies.  You may see some blood in your urine while the stent is in place and a few days afterwards. Do not be alarmed, even if the urine was clear for a while. Get off your feet and drink lots of fluids until clearing occurs. If you start to pass clots or don't improve, call us.  DIET: You may return to your normal diet immediately. Because of the raw surface of your bladder, alcohol, spicy foods, acid type foods and drinks with caffeine may cause irritation or frequency and should be used in moderation. To keep your urine flowing freely and to avoid constipation, drink plenty of fluids during the day ( 8-10 glasses ). Tip: Avoid cranberry juice because it is very acidic.  ACTIVITY: Your physical activity doesn't need to be restricted. However, if you are very active, you may see some blood in your urine. We suggest that you reduce your activity under these circumstances until  the bleeding has stopped.  BOWELS: It is important to keep your bowels regular during the postoperative period. Straining with bowel movements can cause bleeding. A bowel movement every other day is reasonable. Use a mild laxative if needed, such as Milk of Magnesia 2-3 tablespoons, or 2 Dulcolax tablets. Call if you continue to have problems. If you have been taking narcotics for pain, before, during or after your surgery, you may be constipated. Take a laxative if necessary.   MEDICATION: You should resume your pre-surgery medications unless told not to. In addition you will often be given an antibiotic to prevent infection and likely several as needed medications for stent related discomfort. These should be taken as prescribed until the bottles are finished unless you are having an unusual reaction to one of the drugs.  PROBLEMS YOU SHOULD REPORT TO [place]: Fevers over 100.5 Fahrenheit. Heavy bleeding, or clots ( See above notes about blood in urine ). Inability to urinate. Drug reactions ( hives, rash, nausea, vomiting, diarrhea ). Severe burning or pain with urination that is not improving.

## 2022-04-12 NOTE — Anesthesia Preprocedure Evaluation (Signed)
Anesthesia Evaluation  Patient identified by MRN, date of birth, ID band Patient awake    Reviewed: Allergy & Precautions, NPO status , Patient's Chart, lab work & pertinent test results  Airway Mallampati: II  TM Distance: >3 FB Neck ROM: Full    Dental  (+) Dental Advisory Given   Pulmonary COPD, Current Smoker and Patient abstained from smoking.,    breath sounds clear to auscultation       Cardiovascular hypertension, Pt. on medications  Rhythm:Regular Rate:Normal     Neuro/Psych negative neurological ROS     GI/Hepatic Neg liver ROS, GERD  ,  Endo/Other  Hypothyroidism   Renal/GU Renal InsufficiencyRenal disease     Musculoskeletal  (+) Arthritis ,   Abdominal   Peds  Hematology  (+) Blood dyscrasia, anemia ,   Anesthesia Other Findings   Reproductive/Obstetrics                             Lab Results  Component Value Date   WBC 6.9 04/08/2022   HGB 11.4 (L) 04/08/2022   HCT 34.1 (L) 04/08/2022   MCV 87.2 04/08/2022   PLT 331 04/08/2022   Lab Results  Component Value Date   CREATININE 2.15 (H) 04/08/2022   BUN 35 (H) 04/08/2022   NA 138 04/08/2022   K 4.4 04/08/2022   CL 108 04/08/2022   CO2 22 04/08/2022    Anesthesia Physical Anesthesia Plan  ASA: 3  Anesthesia Plan: General   Post-op Pain Management: Tylenol PO (pre-op)* and Minimal or no pain anticipated   Induction: Intravenous  PONV Risk Score and Plan: 2 and Dexamethasone, Ondansetron and Treatment may vary due to age or medical condition  Airway Management Planned: LMA  Additional Equipment:   Intra-op Plan:   Post-operative Plan: Extubation in OR  Informed Consent: I have reviewed the patients History and Physical, chart, labs and discussed the procedure including the risks, benefits and alternatives for the proposed anesthesia with the patient or authorized representative who has indicated his/her  understanding and acceptance.     Dental advisory given  Plan Discussed with: CRNA  Anesthesia Plan Comments:         Anesthesia Quick Evaluation

## 2022-04-12 NOTE — Op Note (Signed)
Operative Note  Preoperative diagnosis:  1.  Right ureteral stone 2. R hydronephrosis 3. CKD  Postoperative diagnosis: same  Procedure(s): 1.  Right ureteroscopy with laser lithotripsy of stones 2. Cystoscopy  3. Right retrograde pyelogram 4. Right ureteral stent placement 6x24 cm 5. Fluoroscopy with intraoperative interpretation  Surgeon: Irine Seal, MD  Assistants:  None  Anesthesia:  General  Complications:  None  EBL:  Minimal  Specimens: 1. Urine culture from renal pelvis  Drains/Catheters: 1.  Right 6Fr x 24cm ureteral stent with tether string  Intraoperative findings:   Cystoscopy demonstrated unremarkable bladder Right Ureteroscopy demonstrated large stone, markedly dilated collecting system Right Successful stent placement.    Description of procedure: After informed consent was obtained from the patient, the patient was identified and taken to the operating room and placed in the supine position.  General anesthesia was administered as well as perioperative IV antibiotics.  At the beginning of the case, a time-out was performed to properly identify the patient, the surgery to be performed, and the surgical site.  Sequential compression devices were applied to the lower extremities at the beginning of the case for DVT prophylaxis.  The patient was then placed in the dorsal lithotomy supine position, prepped and draped in sterile fashion.  Preliminary scout fluoroscopy revealed that there was calcification area at the proximal ureter, which corresponds to the stone found on the preoperative CT scan. We then passed the 21-French rigid cystoscope through the urethra and into the bladder under vision without any difficulty , noting a normal urethra without strictures.  A systematic evaluation of the bladder revealed no evidence of any suspicious bladder lesions.  Ureteral orifices were in normal position.    we then passed a 0.038 glide wire up to the level of the  renal pelvis through the right UO.  The cystoscope was withdrawn, and a dual lumen catheter was inserted over the glide wire into the distal ureter. A gentle retrograde pyelogram was performed, revealing a markedly dilated collecting system with severe hydronephrosis. A 0.038 sensor wire was then passed up to the level of the renal pelvis and secured to the drape as a safety wire. The dual lumen was removed.  An 11/13Fr ureteral access sheath was carefully advanced up the ureter to the level of the UPJ over this wire under fluoroscopic guidance. The flexible ureteroscope was advanced into the collecting system via the access sheath. The collecting system was inspected. The calculus was identified in the collecting system. Using the 242 micron holmium laser fiber, the stone was fragmented completely. These were sent for chemical analysis. With the ureteroscope in the kidney, a gentle pyelogram was performed to delineate the calyceal system and we evaluated the calyces systematically x2. We encountered no further stone fragments. There was copious debris in the collecting system, so I obtained a urine culture through the ureteroscope. The rest of the stone fragments were very tiny and these were  irrigated away gently. The calyces were re-inspected and there were no significant stone fragment residual.   We then withdrew the ureteroscope back down the ureter along with the access sheath, noting no evidence of any stones along the course of the ureter. Once the ureteroscope was removed, we then used the Glidewire under fluoroscopic guidance and passed up a 6-French x 24 cm double-pigtail ureteral stent up the ureter, making sure that the proximal and distal ends coiled within the kidney and bladder respectively.  Note that we left a tether string attached to the  distal end of the ureteral stent and it exited the urethral meatus and was secured to the mons with a tegaderm adhesive.  The cystoscope was then  advanced back into the bladder under vision.  We were able to see the distal stent coiling nicely within the bladder.  The bladder was then emptied with irrigation solution.  The cystoscope was then removed.    The patient tolerated the procedure well and there was no complication. Patient was awoken from anesthesia and taken to the recovery room in stable condition. I was present and scrubbed for the entirety of the case.  Plan:  Patient will be discharged home and can remove the stent on Friday. She has f/u scheduled with Dr Cardell Peach to discuss L PCNL.   Neva Seat MD [facility]  Pager: [phone]

## 2022-04-12 NOTE — Anesthesia Procedure Notes (Signed)
Procedure Name: LMA Insertion Date/Time: 04/12/2022 9:19 AM Performed by: Donna Bernard, CRNA Pre-anesthesia Checklist: Patient identified, Emergency Drugs available, Suction available, Patient being monitored and Timeout performed Patient Re-evaluated:Patient Re-evaluated prior to induction Oxygen Delivery Method: Circle system utilized Preoxygenation: Pre-oxygenation with 100% oxygen Induction Type: IV induction Ventilation: Mask ventilation without difficulty LMA: LMA inserted LMA Size: 4.0 Number of attempts: 1 Tube secured with: Tape Dental Injury: Teeth and Oropharynx as per pre-operative assessment

## 2022-04-12 NOTE — H&P (Signed)
H&P  History of Present Illness: Kathy Vargas is a 71 y.o. year old who presents today for right ureteroscopy with laser litho, stent placement for a long standing obstructing ureteral stone.   She also has a large left renal stone.   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   ALLERGIC RHINITIS 03/02/2009   Anemia    ANXIETY 07/02/2007   BACK PAIN 05/19/2009   CHRONIC OBSTRUCTIVE PULMONARY DISEASE, ACUTE EXACERBATION 08/06/2009   COPD 07/02/2007   DEGENERATIVE JOINT DISEASE, CERVICAL SPINE 07/02/2007   DEPRESSION 07/02/2007   GASTROENTERITIS, VIRAL 11/29/2010   GERD 01/21/2011   Headache(784.0) 07/02/2007   History of kidney stones    Hypertension    HYPOTHYROIDISM 07/02/2007   Lumbar disc disease    Lumbar pain with radiation down right leg 06/01/2011   OSTEOARTHRITIS 07/02/2007   Other and unspecified ovarian cyst 05/19/2009   PAIN, CHRONIC, DUE TO TRAUMA 07/02/2007   PELVIC  PAIN 03/29/2010   UTI 05/19/2009   Vitamin D insufficiency     Past Surgical History:  Procedure Laterality Date   ABDOMINAL HYSTERECTOMY     CHOLECYSTECTOMY     eyebrow lift     LIPOSUCTION     LUMBAR DISC SURGERY     L4, L5    Home Medications:  Current Meds  Medication Sig   albuterol (VENTOLIN HFA) 108 (90 Base) MCG/ACT inhaler Inhale 2 puffs into the lungs every 6 (six) hours as needed for wheezing or shortness of breath.   ALPRAZolam (XANAX) 0.5 MG tablet Take 1 tablet (0.5 mg total) by mouth 2 (two) times daily as needed. (Patient taking differently: Take 0.5 mg by mouth at bedtime as needed (anxiety/sleep).)   amoxicillin-clavulanate (AUGMENTIN) 875-125 MG tablet Take 1 tablet by mouth 2 times a day for 7 days then daily until done   amphetamine-dextroamphetamine (ADDERALL) 30 MG tablet Take 1 tablet by mouth 2 (two)  times daily. (Patient taking differently: Take 30 mg by mouth See admin instructions. Take 1 tablet (30 mg) by mouth scheduled in the morning & may take an additional dose with lunch if needed for focus/attention.)   Ascorbic Acid (VITAMIN C PO) Take 1 tablet by mouth in the morning.   Cholecalciferol (VITAMIN D3 PO) Take 1 tablet by mouth in the morning.   diclofenac (VOLTAREN) 75 MG EC tablet Take 1 tablet (75 mg total) by mouth 2 (two) times daily. (Patient taking differently: Take 75 mg by mouth 2 (two) times daily as needed (back pain.).)   irbesartan (AVAPRO) 150 MG tablet Take 1 tablet (150 mg total) by mouth daily. (Patient taking differently: Take 150 mg by mouth 2 (two) times a week.)   iron polysaccharides (NU-IRON) 150 MG capsule Take 1 capsule (150 mg total) by mouth daily. (Patient taking differently: Take 150 mg by mouth every evening.)   pantoprazole (PROTONIX) 40 MG tablet Take 1 tablet (40 mg total) by mouth daily. (Patient taking differently: Take 40 mg by mouth daily as needed (stomach issues/GERD).)    Allergies:  Allergies  Allergen Reactions   Morphine Nausea And Vomiting   Ceftin [Cefuroxime]     Nausea and vomiting    Family History  Problem Relation Age of Onset   Alcohol abuse Other    Anxiety disorder Other    Coronary artery disease Other    Depression  Other    Stroke Other     Social History:  reports that she has been smoking cigarettes. She started smoking about 59 years ago. She has a 28.00 pack-year smoking history. She has never used smokeless tobacco. She reports that she does not currently use alcohol. She reports that she does not use drugs.  ROS: A complete review of systems was performed.  All systems are negative except for pertinent findings as noted.  Physical Exam:  Vital signs in last 24 hours:   Constitutional:  Alert and oriented, No acute distress Cardiovascular: Regular rate and rhythm, No JVD Respiratory: Normal respiratory effort,  Lungs clear bilaterally GI: Abdomen is soft, nontender, nondistended, no abdominal masses GU: No CVA tenderness Lymphatic: No lymphadenopathy Neurologic: Grossly intact, no focal deficits Psychiatric: Normal mood and affect   Laboratory Data:  No results for input(s): WBC, HGB, HCT, PLT in the last 72 hours.  No results for input(s): NA, K, CL, GLUCOSE, BUN, CALCIUM, CREATININE in the last 72 hours.  Invalid input(s): CO3   No results found for this or any previous visit (from the past 24 hour(s)). No results found for this or any previous visit (from the past 240 hour(s)).  Renal Function: Recent Labs    04/08/22 0907  CREATININE 2.15*   Estimated Creatinine Clearance: 21.4 mL/min (A) (by C-G formula based on SCr of 2.15 mg/dL (H)).  Radiologic Imaging: No results found.  Assessment:  71 yo F with obstructing right renal stone, hydronephrosis, large left renal stone  Plan:  To OR for R ureteroscopy with laser litho, stent placement. We discussed the possible need for a staged procedure given the stone burden on the right. WE also discussed it is possible the patient may need a neph tube if I am not able to bypass the stone in a retrograde fashion. She voiced understanding.  Donald Pore, MD 04/12/2022, 7:30 AM  [facility] Pager: [phone]

## 2022-04-13 ENCOUNTER — Inpatient Hospital Stay: Admission: RE | Admit: 2022-04-13 | Payer: Medicare Other | Source: Ambulatory Visit

## 2022-04-13 ENCOUNTER — Encounter (HOSPITAL_COMMUNITY): Payer: Self-pay | Admitting: Urology

## 2022-04-13 LAB — URINE CULTURE: Culture: NO GROWTH

## 2022-04-19 DIAGNOSIS — N202 Calculus of kidney with calculus of ureter: Secondary | ICD-10-CM | POA: Diagnosis not present

## 2022-04-25 ENCOUNTER — Other Ambulatory Visit (HOSPITAL_COMMUNITY): Payer: Self-pay

## 2022-04-25 MED ORDER — AMPICILLIN 500 MG PO CAPS
ORAL_CAPSULE | ORAL | 0 refills | Status: DC
  Filled 2022-04-25: qty 28, 7d supply, fill #0

## 2022-05-03 DIAGNOSIS — N200 Calculus of kidney: Secondary | ICD-10-CM | POA: Diagnosis not present

## 2022-05-09 ENCOUNTER — Other Ambulatory Visit (HOSPITAL_COMMUNITY): Payer: Self-pay | Admitting: Urology

## 2022-05-09 ENCOUNTER — Other Ambulatory Visit: Payer: Self-pay | Admitting: Urology

## 2022-05-09 DIAGNOSIS — N200 Calculus of kidney: Secondary | ICD-10-CM

## 2022-05-11 ENCOUNTER — Other Ambulatory Visit: Payer: Self-pay | Admitting: Internal Medicine

## 2022-05-11 ENCOUNTER — Other Ambulatory Visit (HOSPITAL_COMMUNITY): Payer: Self-pay

## 2022-05-11 MED ORDER — AMPHETAMINE-DEXTROAMPHETAMINE 30 MG PO TABS
30.0000 mg | ORAL_TABLET | Freq: Two times a day (BID) | ORAL | 0 refills | Status: DC
  Filled 2022-05-11: qty 60, 30d supply, fill #0

## 2022-05-26 ENCOUNTER — Other Ambulatory Visit (HOSPITAL_COMMUNITY): Payer: Self-pay

## 2022-05-26 DIAGNOSIS — N132 Hydronephrosis with renal and ureteral calculous obstruction: Secondary | ICD-10-CM | POA: Diagnosis not present

## 2022-05-26 DIAGNOSIS — N202 Calculus of kidney with calculus of ureter: Secondary | ICD-10-CM | POA: Diagnosis not present

## 2022-05-26 MED ORDER — PANTOPRAZOLE SODIUM 40 MG PO TBEC
DELAYED_RELEASE_TABLET | ORAL | 2 refills | Status: DC
  Filled 2022-05-26: qty 30, 30d supply, fill #0
  Filled 2022-07-27: qty 30, 30d supply, fill #1
  Filled 2022-09-07: qty 30, 30d supply, fill #2

## 2022-06-15 ENCOUNTER — Other Ambulatory Visit: Payer: Self-pay | Admitting: Internal Medicine

## 2022-06-15 ENCOUNTER — Other Ambulatory Visit (HOSPITAL_COMMUNITY): Payer: Self-pay

## 2022-06-15 MED ORDER — AMPHETAMINE-DEXTROAMPHETAMINE 30 MG PO TABS
30.0000 mg | ORAL_TABLET | Freq: Two times a day (BID) | ORAL | 0 refills | Status: DC
  Filled 2022-06-15: qty 60, 30d supply, fill #0

## 2022-06-16 ENCOUNTER — Other Ambulatory Visit (HOSPITAL_COMMUNITY): Payer: Self-pay

## 2022-06-23 NOTE — Patient Instructions (Signed)
DUE TO COVID-19 ONLY TWO VISITORS  (aged 71 and older)  ARE ALLOWED TO COME WITH YOU AND STAY IN THE WAITING ROOM ONLY DURING PRE OP AND PROCEDURE.   **NO VISITORS ARE ALLOWED IN THE SHORT STAY AREA OR RECOVERY ROOM!!**  IF YOU WILL BE ADMITTED INTO THE HOSPITAL YOU ARE ALLOWED ONLY FOUR SUPPORT PEOPLE DURING VISITATION HOURS ONLY (7 AM -8PM)   The support person(s) must pass our screening, gel in and out, and wear a mask at all times, including in the patient's room. Patients must also wear a mask when staff or their support person are in the room. Visitors GUEST BADGE MUST BE WORN VISIBLY  One adult visitor may remain with you overnight and MUST be in the room by 8 P.M.     Your procedure is scheduled on: 07/01/22   Report to [facility] Main Entrance    Report to admitting at : 7:30 AM   Call this number if you have problems the morning of surgery [phone]   Do not eat food :After Midnight.   After Midnight you may have the following liquids until : 8:00 AM DAY OF SURGERY  Water Black Coffee (sugar ok, NO MILK/CREAM OR CREAMERS)  Tea (sugar ok, NO MILK/CREAM OR CREAMERS) regular and decaf                             Plain Jell-O (NO RED)                                           Fruit ices (not with fruit pulp, NO RED)                                     Popsicles (NO RED)                                                                  Juice: apple, WHITE grape, WHITE cranberry Sports drinks like Gatorade (NO RED)    Oral Hygiene is also important to reduce your risk of infection.                                    Remember - BRUSH YOUR TEETH THE MORNING OF SURGERY WITH YOUR REGULAR TOOTHPASTE   Do NOT smoke after Midnight   Take these medicines the morning of surgery with A SIP OF WATER: pantoprazole.Use inhalers as usual.Cetirizine as needed.  DO NOT TAKE ANY ORAL DIABETIC MEDICATIONS DAY OF YOUR SURGERY  Bring CPAP mask and tubing day of surgery.                               You may not have any metal on your body including hair pins, jewelry, and body piercing             Do not wear make-up, lotions, powders, perfumes/cologne, or deodorant  Do not  wear nail polish including gel and S&S, artificial/acrylic nails, or any other type of covering on natural nails including finger and toenails. If you have artificial nails, gel coating, etc. that needs to be removed by a nail salon please have this removed prior to surgery or surgery may need to be canceled/ delayed if the surgeon/ anesthesia feels like they are unable to be safely monitored.   Do not shave  48 hours prior to surgery.    Do not bring valuables to the hospital. [facility] IS NOT             RESPONSIBLE   FOR VALUABLES.   Contacts, dentures or bridgework may not be worn into surgery.   Bring small overnight bag day of surgery.   DO NOT BRING YOUR HOME MEDICATIONS TO THE HOSPITAL. PHARMACY WILL DISPENSE MEDICATIONS LISTED ON YOUR MEDICATION LIST TO YOU DURING YOUR ADMISSION IN THE HOSPITAL!    Patients discharged on the day of surgery will not be allowed to drive home.  Someone NEEDS to stay with you for the first 24 hours after anesthesia.   Special Instructions: Bring a copy of your healthcare power of attorney and living will documents         the day of surgery if you haven't scanned them before.              Please read over the following fact sheets you were given: IF YOU HAVE QUESTIONS ABOUT YOUR PRE-OP INSTRUCTIONS PLEASE CALL [phone]     [facility] - Preparing for Surgery Before surgery, you can play an important role.  Because skin is not sterile, your skin needs to be as free of germs as possible.  You can reduce the number of germs on your skin by washing with CHG (chlorahexidine gluconate) soap before surgery.  CHG is an antiseptic cleaner which kills germs and bonds with the skin to continue killing germs even after washing. Please DO NOT use if you have an  allergy to CHG or antibacterial soaps.  If your skin becomes reddened/irritated stop using the CHG and inform your nurse when you arrive at Short Stay. Do not shave (including legs and underarms) for at least 48 hours prior to the first CHG shower.  You may shave your face/neck. Please follow these instructions carefully:  1.  Shower with CHG Soap the night before surgery and the  morning of Surgery.  2.  If you choose to wash your hair, wash your hair first as usual with your  normal  shampoo.  3.  After you shampoo, rinse your hair and body thoroughly to remove the  shampoo.                           4.  Use CHG as you would any other liquid soap.  You can apply chg directly  to the skin and wash                       Gently with a scrungie or clean washcloth.  5.  Apply the CHG Soap to your body ONLY FROM THE NECK DOWN.   Do not use on face/ open                           Wound or open sores. Avoid contact with eyes, ears mouth and genitals (private parts).  Wash face,  Genitals (private parts) with your normal soap.             6.  Wash thoroughly, paying special attention to the area where your surgery  will be performed.  7.  Thoroughly rinse your body with warm water from the neck down.  8.  DO NOT shower/wash with your normal soap after using and rinsing off  the CHG Soap.                9.  Pat yourself dry with a clean towel.            10.  Wear clean pajamas.            11.  Place clean sheets on your bed the night of your first shower and do not  sleep with pets. Day of Surgery : Do not apply any lotions/deodorants the morning of surgery.  Please wear clean clothes to the hospital/surgery center.  FAILURE TO FOLLOW THESE INSTRUCTIONS MAY RESULT IN THE CANCELLATION OF YOUR SURGERY PATIENT SIGNATURE_________________________________  NURSE SIGNATURE__________________________________  ________________________________________________________________________

## 2022-06-24 ENCOUNTER — Other Ambulatory Visit: Payer: Self-pay

## 2022-06-24 ENCOUNTER — Encounter (HOSPITAL_COMMUNITY)
Admission: RE | Admit: 2022-06-24 | Discharge: 2022-06-24 | Disposition: A | Discharge status: Home / Self Care | Payer: Medicare Other | Source: Ambulatory Visit | Attending: Urology | Admitting: Urology

## 2022-06-24 ENCOUNTER — Encounter (HOSPITAL_COMMUNITY): Payer: Self-pay | Admitting: Physician Assistant

## 2022-06-24 ENCOUNTER — Other Ambulatory Visit (HOSPITAL_COMMUNITY): Payer: Self-pay

## 2022-06-24 ENCOUNTER — Encounter (HOSPITAL_COMMUNITY): Payer: Self-pay

## 2022-06-24 ENCOUNTER — Telehealth: Payer: Self-pay | Admitting: Internal Medicine

## 2022-06-24 VITALS — BP 176/108 | HR 67 | Temp 97.5°F | Ht 65.0 in | Wt 121.0 lb

## 2022-06-24 DIAGNOSIS — Z79899 Other long term (current) drug therapy: Secondary | ICD-10-CM | POA: Diagnosis not present

## 2022-06-24 DIAGNOSIS — Z7951 Long term (current) use of inhaled steroids: Secondary | ICD-10-CM | POA: Insufficient documentation

## 2022-06-24 DIAGNOSIS — Z01812 Encounter for preprocedural laboratory examination: Secondary | ICD-10-CM | POA: Insufficient documentation

## 2022-06-24 DIAGNOSIS — I10 Essential (primary) hypertension: Secondary | ICD-10-CM | POA: Insufficient documentation

## 2022-06-24 DIAGNOSIS — J449 Chronic obstructive pulmonary disease, unspecified: Secondary | ICD-10-CM | POA: Diagnosis not present

## 2022-06-24 DIAGNOSIS — F17210 Nicotine dependence, cigarettes, uncomplicated: Secondary | ICD-10-CM | POA: Insufficient documentation

## 2022-06-24 DIAGNOSIS — Z87442 Personal history of urinary calculi: Secondary | ICD-10-CM | POA: Diagnosis not present

## 2022-06-24 DIAGNOSIS — N200 Calculus of kidney: Secondary | ICD-10-CM | POA: Insufficient documentation

## 2022-06-24 HISTORY — DX: Dyspnea, unspecified: R06.00

## 2022-06-24 LAB — CBC
HCT: 38.2 % (ref 36.0–46.0)
Hemoglobin: 12.1 g/dL (ref 12.0–15.0)
MCH: 28.3 pg (ref 26.0–34.0)
MCHC: 31.7 g/dL (ref 30.0–36.0)
MCV: 89.3 fL (ref 80.0–100.0)
Platelets: 339 10*3/uL (ref 150–400)
RBC: 4.28 MIL/uL (ref 3.87–5.11)
RDW: 13.1 % (ref 11.5–15.5)
WBC: 5.5 10*3/uL (ref 4.0–10.5)
nRBC: 0 % (ref 0.0–0.2)

## 2022-06-24 LAB — BASIC METABOLIC PANEL
Anion gap: 6 (ref 5–15)
BUN: 34 mg/dL — ABNORMAL HIGH (ref 8–23)
CO2: 24 mmol/L (ref 22–32)
Calcium: 9.5 mg/dL (ref 8.9–10.3)
Chloride: 107 mmol/L (ref 98–111)
Creatinine, Ser: 2.16 mg/dL — ABNORMAL HIGH (ref 0.44–1.00)
GFR, Estimated: 24 mL/min — ABNORMAL LOW (ref >60)
Glucose, Bld: 111 mg/dL — ABNORMAL HIGH (ref 70–99)
Potassium: 5 mmol/L (ref 3.5–5.1)
Sodium: 137 mmol/L (ref 135–145)

## 2022-06-24 MED ORDER — IRBESARTAN 300 MG PO TABS
300.0000 mg | ORAL_TABLET | Freq: Every day | ORAL | 3 refills | Status: DC
  Filled 2022-06-24: qty 30, 30d supply, fill #0

## 2022-06-24 NOTE — Progress Notes (Signed)
Lab. Results: Creatinine: 2.16 

## 2022-06-24 NOTE — Progress Notes (Addendum)
For Short Stay: COVID SWAB appointment date: Date of COVID positive in last 90 days:  Bowel Prep reminder:   For Anesthesia: PCP - Dr. Oliver Barre Cardiologist -   Chest x-ray -  EKG - 04/08/22 Stress Test -  ECHO -  Cardiac Cath -  Pacemaker/ICD device last checked: Pacemaker orders received: Device Rep notified:  Spinal Cord Stimulator:  Sleep Study -  CPAP -   Fasting Blood Sugar -  Checks Blood Sugar _____ times a day Date and result of last Hgb A1c-  Blood Thinner Instructions: Aspirin Instructions: Last Dose:  Activity level: Can go up a flight of stairs and activities of daily living without stopping and without chest pain and/or shortness of breath   Able to exercise without chest pain and/or shortness of breath   Unable to go up a flight of stairs without chest pain and/or shortness of breath     Anesthesia review: Hx: HTN,Smoker,COPD. BP: 192/111, 176/108. Jessica PAC was notified,pt. Was advised to check with PCP and informed about today's BP readings.  Patient denies shortness of breath, fever, cough and chest pain at PAT appointment   Patient verbalized understanding of instructions that were given to them at the PAT appointment. Patient was also instructed that they will need to review over the PAT instructions again at home before surgery.

## 2022-06-24 NOTE — Telephone Encounter (Signed)
Pt called and advised she is sscheduled to have surgery on 07/01/22. She stated Pre-op contacted her and let her know that her BP is too high for surgery and that she needed to reach out to her PCP. Pt stated they advised her that irbesartan (AVAPRO) 150 MG tablet may not be working and that she needs to speak with Dr. Jonny Ruiz and call them back to let them know the plan.   Please advise   CB: [phone]

## 2022-06-24 NOTE — Telephone Encounter (Signed)
Ok to incresae the avapro to 300 mg  - done erx  BP Readings from Last 3 Encounters:  06/24/22 (!) 176/108  04/12/22 (!) 153/87  04/08/22 (!) 151/90   ROV in 1 week please

## 2022-06-24 NOTE — Telephone Encounter (Signed)
Notified pt w/MD response. Appt 06/30/22...Raechel Chute

## 2022-06-24 NOTE — Telephone Encounter (Signed)
PT visits today regarding this issue and I had let her know we had not received a pre-op form from the office requesting pre-op clearance. PT stated that she does have a direct line to the office and suggested that medical staff reach her to see how to proceed.  CB for office: [phone]

## 2022-06-27 NOTE — Progress Notes (Signed)
Anesthesia Chart Review:   Case: [id number] Date/Time: 07/01/22 1045   Procedures:      NEPHROLITHOTOMY PERCUTANEOUS/ INTERVENTIONAL RADIOLOGY TO PLACE LEFT NEPHROSTOMY TUBE PRIOR (Left) - ONLY NEEDS 120 MIN FOR ALL PROCEDURES     CYSTOSCOPY/ RETROGRADE/URETEROSCOPY/HOLMIUM LASER/STENT PLACEMENT (Left)   Anesthesia type: General   Pre-op diagnosis: LEFT RENAL STONES   Location: [facility] ROOM 10 / [facility]   Surgeons: Jannifer Hick, MD       DISCUSSION: Pt is 71 years old with hx HTN, COPD. Current smoker.   BP elevated at pre-admission testing. Initiall 192/111, recheck 176/108. Pt advised to f/u with PCP. Pt reached out to PCP who increased her Avapro from 150mg  to 300mg .   Cr is 2.16, this is consistent with recent results. PCP obtained renal which showed B hydronephrosis due to kidney stones, suspects this is cause of decreased renal function - referred to urology.   VS: BP (!) 176/108   Pulse 67   Temp (!) 36.4 C (Oral)   Ht 5\' 5"  (1.651 m)   Wt 54.9 kg   SpO2 100%   BMI 20.14 kg/m   PROVIDERS: - PCP is , MD   LABS: Labs reviewed: Acceptable for surgery. (all labs ordered are listed, but only abnormal results are displayed)  Labs Reviewed  BASIC METABOLIC PANEL - Abnormal; Notable for the following components:      Result Value   Glucose, Bld 111 (*)    BUN 34 (*)    Creatinine, Ser 2.16 (*)    GFR, Estimated 24 (*)    All other components within normal limits  CBC     IMAGES: Renal US 03/25/22:  - Marked right-sided hydroureteronephrosis secondary to a proximal right ureteric stone. - Moderate left-sided hydronephrosis with large volume left renal collecting system calculi, new since 05/10/2009.  - Consider further evaluation with at minimum CT stone study versus more complete characterization with pre and post-contrast hematuria protocol CT.   EKG 04/08/22: NSR. Nonspecific ST and T wave abnormality   CV: N/A  Past Medical History:  Diagnosis  Date   ABDOMINAL TENDERNESS 01/21/2011   ACUTE GINGIVITIS NONPLAQUE INDUCED 06/11/2010   ADD 09/05/2008   Alcohol abuse, in remission 09/27/2007   ALLERGIC RHINITIS 03/02/2009   Anemia    ANXIETY 07/02/2007   BACK PAIN 05/19/2009   CHRONIC OBSTRUCTIVE PULMONARY DISEASE, ACUTE EXACERBATION 08/06/2009   COPD 07/02/2007   DEGENERATIVE JOINT DISEASE, CERVICAL SPINE 07/02/2007   DEPRESSION 07/02/2007   Dyspnea    GASTROENTERITIS, VIRAL 11/29/2010   GERD 01/21/2011   Headache(784.0) 07/02/2007   History of kidney stones    Hypertension    HYPOTHYROIDISM 07/02/2007   Lumbar disc disease    Lumbar pain with radiation down right leg 06/01/2011   OSTEOARTHRITIS 07/02/2007   Other and unspecified ovarian cyst 05/19/2009   PAIN, CHRONIC, DUE TO TRAUMA 07/02/2007   PELVIC  PAIN 03/29/2010   UTI 05/19/2009   Vitamin D insufficiency     Past Surgical History:  Procedure Laterality Date   ABDOMINAL HYSTERECTOMY     CATARACT EXTRACTION W/ INTRAOCULAR LENS IMPLANT Bilateral    CHOLECYSTECTOMY     eyebrow lift     LIPOSUCTION     LUMBAR DISC SURGERY     L4, L5   URETEROSCOPY WITH HOLMIUM LASER LITHOTRIPSY Right 04/12/2022   Procedure: URETEROSCOPY WITH HOLMIUM LASER LITHOTRIPSY, STENT PLACEMENT;  Surgeon: 05/29/2010, MD;  Location: [facility];  Service: Urology;  Laterality: Right;  MEDICATIONS:  albuterol (VENTOLIN HFA) 108 (90 Base) MCG/ACT inhaler   ALPRAZolam (XANAX) 0.5 MG tablet   amoxicillin-clavulanate (AUGMENTIN) 875-125 MG tablet   amphetamine-dextroamphetamine (ADDERALL) 30 MG tablet   ampicillin (PRINCIPEN) 500 MG capsule   Ascorbic Acid (VITAMIN C PO)   Biotin w/ Vitamins C & E (HAIR/SKIN/NAILS PO)   Calcium-Magnesium-Zinc (CAL-MAG-ZINC PO)   celecoxib (CELEBREX) 100 MG capsule   cetirizine (ZYRTEC) 10 MG tablet   Cholecalciferol (VITAMIN D3 PO)   diclofenac (VOLTAREN) 75 MG EC tablet   fluticasone (CUTIVATE) 0.005 % ointment   fluticasone-salmeterol (ADVAIR  DISKUS) 250-50 MCG/ACT AEPB   irbesartan (AVAPRO) 300 MG tablet   iron polysaccharides (NU-IRON) 150 MG capsule   nystatin cream (MYCOSTATIN)   oxyCODONE-acetaminophen (PERCOCET) 5-325 MG tablet   pantoprazole (PROTONIX) 40 MG tablet   pantoprazole (PROTONIX) 40 MG tablet   phenazopyridine (PYRIDIUM) 100 MG tablet   No current facility-administered medications for this encounter.    If BP acceptable day of surgery, I anticipate pt can proceed with surgery as scheduled.  Rica Mast, PhD, FNP-BC [facility]/Anesthesiology Phone: [phone] 06/27/2022 12:01 PM

## 2022-06-27 NOTE — Anesthesia Preprocedure Evaluation (Addendum)
Anesthesia Evaluation  Patient identified by MRN, date of birth, ID band Patient awake    Reviewed: Allergy & Precautions, NPO status , Patient's Chart, lab work & pertinent test results  Airway Mallampati: II  TM Distance: >3 FB Neck ROM: Full    Dental  (+) Edentulous Upper, Edentulous Lower   Pulmonary shortness of breath and with exertion, COPD,  COPD inhaler, Current Smoker and Patient abstained from smoking.,  Current smoker, 28 pack year history    Pulmonary exam normal breath sounds clear to auscultation       Cardiovascular hypertension, Pt. on medications Normal cardiovascular exam Rhythm:Regular Rate:Normal  BP elevated at pre-admission testing. Initially 192/111, recheck 176/108. Pt advised to f/u with PCP. Pt reached out to PCP who increased her Avapro from 150mg  to 300mg .    Neuro/Psych  Headaches, PSYCHIATRIC DISORDERS Anxiety Depression    GI/Hepatic GERD  Medicated and Controlled,(+)       alcohol use, Remote hx etoh abuse   Endo/Other  Hypothyroidism   Renal/GU Renal InsufficiencyRenal diseaseCr 2.16 L renal stones  negative genitourinary   Musculoskeletal  (+) Arthritis , Osteoarthritis,    Abdominal   Peds  Hematology negative hematology ROS (+) Hb 12.2, plt 339   Anesthesia Other Findings   Reproductive/Obstetrics negative OB ROS                           Anesthesia Physical Anesthesia Plan  ASA: 3  Anesthesia Plan: General   Post-op Pain Management: Tylenol PO (pre-op)*   Induction: Intravenous  PONV Risk Score and Plan: 2 and Ondansetron, Dexamethasone, Midazolam and Treatment may vary due to age or medical condition  Airway Management Planned: Oral ETT  Additional Equipment: None  Intra-op Plan:   Post-operative Plan: Extubation in OR  Informed Consent: I have reviewed the patients History and Physical, chart, labs and discussed the procedure  including the risks, benefits and alternatives for the proposed anesthesia with the patient or authorized representative who has indicated his/her understanding and acceptance.     Dental advisory given  Plan Discussed with: CRNA  Anesthesia Plan Comments:        Anesthesia Quick Evaluation

## 2022-06-30 ENCOUNTER — Other Ambulatory Visit (HOSPITAL_COMMUNITY): Payer: Self-pay

## 2022-06-30 ENCOUNTER — Encounter: Payer: Self-pay | Admitting: Internal Medicine

## 2022-06-30 ENCOUNTER — Other Ambulatory Visit: Payer: Self-pay | Admitting: Radiology

## 2022-06-30 ENCOUNTER — Ambulatory Visit: Payer: Medicare Other | Admitting: Internal Medicine

## 2022-06-30 VITALS — BP 152/84 | HR 82 | Temp 97.9°F | Ht 65.0 in | Wt 122.0 lb

## 2022-06-30 DIAGNOSIS — F411 Generalized anxiety disorder: Secondary | ICD-10-CM

## 2022-06-30 DIAGNOSIS — N200 Calculus of kidney: Secondary | ICD-10-CM | POA: Diagnosis not present

## 2022-06-30 DIAGNOSIS — R739 Hyperglycemia, unspecified: Secondary | ICD-10-CM | POA: Diagnosis not present

## 2022-06-30 DIAGNOSIS — I10 Essential (primary) hypertension: Secondary | ICD-10-CM

## 2022-06-30 DIAGNOSIS — F17200 Nicotine dependence, unspecified, uncomplicated: Secondary | ICD-10-CM

## 2022-06-30 MED ORDER — HYDRALAZINE HCL 50 MG PO TABS
50.0000 mg | ORAL_TABLET | Freq: Three times a day (TID) | ORAL | 1 refills | Status: DC
Start: 2022-06-30 — End: 2023-10-12
  Filled 2022-06-30: qty 90, 30d supply, fill #0

## 2022-06-30 MED ORDER — ALPRAZOLAM 0.5 MG PO TABS
0.5000 mg | ORAL_TABLET | Freq: Two times a day (BID) | ORAL | 5 refills | Status: DC | PRN
Start: 2022-06-30 — End: 2022-12-08
  Filled 2022-06-30: qty 60, 30d supply, fill #0
  Filled 2022-09-05: qty 60, 30d supply, fill #1

## 2022-06-30 NOTE — H&P (Signed)
Chief Complaint: Patient was seen in consultation today for left renal stone  at the request of Gay,Matthew R  Referring Physician(s): Gay,Matthew R  Supervising Physician: Gilmer Mor  Patient Status: [facility] - Out-pt  History of Present Illness: Kathy Vargas is a 71 y.o. female with PMH of anemia, COPD, DJD of cervical spine, HTN, tobacco abuse and chronic pelvic pain. Pt underwent renal US for decreased renal function 03/25/22 that showed marked right-sided hydroureteronephrosis secondary to proximal right ureteric stone and moderate left-sided hydronephrosis with large volume left renal collecting system calculi. Pt was referred to urology d/t results and creatinine of 2.16. Pt referred by Dr. Cardell Peach to IR for left PCN placement for same day urological procedure for left renal stone.   Past Medical History:  Diagnosis Date   ABDOMINAL TENDERNESS 01/21/2011   ACUTE GINGIVITIS NONPLAQUE INDUCED 06/11/2010   ADD 09/05/2008   Alcohol abuse, in remission 09/27/2007   ALLERGIC RHINITIS 03/02/2009   Anemia    ANXIETY 07/02/2007   BACK PAIN 05/19/2009   CHRONIC OBSTRUCTIVE PULMONARY DISEASE, ACUTE EXACERBATION 08/06/2009   COPD 07/02/2007   DEGENERATIVE JOINT DISEASE, CERVICAL SPINE 07/02/2007   DEPRESSION 07/02/2007   Dyspnea    GASTROENTERITIS, VIRAL 11/29/2010   GERD 01/21/2011   Headache(784.0) 07/02/2007   History of kidney stones    Hypertension    HYPOTHYROIDISM 07/02/2007   Lumbar disc disease    Lumbar pain with radiation down right leg 06/01/2011   OSTEOARTHRITIS 07/02/2007   Other and unspecified ovarian cyst 05/19/2009   PAIN, CHRONIC, DUE TO TRAUMA 07/02/2007   PELVIC  PAIN 03/29/2010   UTI 05/19/2009   Vitamin D insufficiency     Past Surgical History:  Procedure Laterality Date   ABDOMINAL HYSTERECTOMY     CATARACT EXTRACTION W/ INTRAOCULAR LENS IMPLANT Bilateral    CHOLECYSTECTOMY     eyebrow lift     LIPOSUCTION     LUMBAR DISC SURGERY     L4, L5    URETEROSCOPY WITH HOLMIUM LASER LITHOTRIPSY Right 04/12/2022   Procedure: URETEROSCOPY WITH HOLMIUM LASER LITHOTRIPSY, STENT PLACEMENT;  Surgeon: Despina Arias, MD;  Location: [facility];  Service: Urology;  Laterality: Right;    Allergies: Morphine and Ceftin [cefuroxime]  Medications: Prior to Admission medications   Medication Sig Start Date End Date Taking? Authorizing Provider  albuterol (VENTOLIN HFA) 108 (90 Base) MCG/ACT inhaler Inhale 2 puffs into the lungs every 6 (six) hours as needed for wheezing or shortness of breath. 08/18/20   Corwin Levins, MD  ALPRAZolam Prudy Feeler) 0.5 MG tablet Take 1 tablet (0.5 mg total) by mouth 2 (two) times daily as needed. Patient taking differently: Take 0.5 mg by mouth at bedtime as needed (anxiety/sleep). 01/07/22   Corwin Levins, MD  amoxicillin-clavulanate (AUGMENTIN) 875-125 MG tablet Take 1 tablet by mouth 2 times a day for 7 days then daily until done Patient not taking: Reported on 06/17/2022 03/31/22     amphetamine-dextroamphetamine (ADDERALL) 30 MG tablet Take 1 tablet by mouth 2 times daily. 06/15/22   Corwin Levins, MD  ampicillin (PRINCIPEN) 500 MG capsule Take 1 capsule by mouth every 6 hours Patient not taking: Reported on 06/17/2022 04/25/22     Ascorbic Acid (VITAMIN C PO) Take 1 tablet by mouth in the morning.    [provider]  Biotin w/ Vitamins C & E (HAIR/SKIN/NAILS PO) Take 1 tablet by mouth daily.    [provider]  Calcium-Magnesium-Zinc (CAL-MAG-ZINC PO) Take 1 tablet by mouth  at bedtime.    [provider]  celecoxib (CELEBREX) 100 MG capsule Take 1 capsule  by mouth 2  times daily as needed for moderate pain or mild pain (do not take diclofenac while taking). Patient not taking: Reported on 06/17/2022 04/12/22 04/12/23  Despina Arias, MD  cetirizine (ZYRTEC) 10 MG tablet Take 10 mg by mouth daily as needed for allergies.    [provider]  Cholecalciferol (VITAMIN D3 PO) Take 1 tablet by  mouth in the morning.    [provider]  diclofenac (VOLTAREN) 75 MG EC tablet Take 1 tablet (75 mg total) by mouth 2 (two) times daily. Patient taking differently: Take 75 mg by mouth 2 (two) times daily as needed (Back pain). 01/07/22   Corwin Levins, MD  fluticasone (CUTIVATE) 0.005 % ointment Apply to the skin 2 times daily if needed Patient not taking: Reported on 04/05/2022 01/11/22     fluticasone-salmeterol (ADVAIR DISKUS) 250-50 MCG/ACT AEPB Inhale 1 puff into the lungs 2 (two) times daily as needed (Asthma).    [provider]  irbesartan (AVAPRO) 300 MG tablet Take 1 tablet by mouth daily. 06/24/22   Corwin Levins, MD  iron polysaccharides (NU-IRON) 150 MG capsule Take 1 capsule (150 mg total) by mouth daily. Patient not taking: Reported on 06/17/2022 01/07/22   Corwin Levins, MD  nystatin cream (MYCOSTATIN) Apply to lips once a day Patient not taking: Reported on 04/05/2022 01/11/22     oxyCODONE-acetaminophen (PERCOCET) 5-325 MG tablet Take 1 tablet by mouth once every 6 hours as needed Patient not taking: Reported on 06/17/2022 04/12/22   Despina Arias, MD  pantoprazole (PROTONIX) 40 MG tablet Take 1 tablet (40 mg total) by mouth daily. Patient not taking: Reported on 06/17/2022 10/18/21   Corwin Levins, MD  pantoprazole (PROTONIX) 40 MG tablet Take 1 tablet by mouth daily. 05/26/22     phenazopyridine (PYRIDIUM) 100 MG tablet Take 2 tablets (200 mg total) by mouth 3 times daily as needed for pain 04/12/22 04/12/23  Despina Arias, MD  FLUoxetine (PROZAC) 20 MG capsule Take 1 capsule (20 mg total) by mouth daily. 09/07/11 02/09/12  Corwin Levins, MD     Family History  Problem Relation Age of Onset   Alcohol abuse Other    Anxiety disorder Other    Coronary artery disease Other    Depression Other    Stroke Other     Social History   Socioeconomic History   Marital status: Divorced    Spouse name: Not on file   Number of children: Not on file   Years of  education: Not on file   Highest education level: Not on file  Occupational History   Not on file  Tobacco Use   Smoking status: Every Day    Packs/day: 0.50    Years: 56.00    Total pack years: 28.00    Types: Cigarettes    Start date: 1964   Smokeless tobacco: Never  Vaping Use   Vaping Use: Never used  Substance and Sexual Activity   Alcohol use: Not Currently   Drug use: No   Sexual activity: Not on file  Other Topics Concern   Not on file  Social History Narrative   Not on file   Social Determinants of Health   Financial Resource Strain: Low Risk  (09/16/2021)   Overall Financial Resource Strain (CARDIA)    Difficulty of Paying Living Expenses: Not hard at all  Food Insecurity: No Food Insecurity (09/16/2021)   Hunger Vital Sign    Worried About Running Out of Food in the Last Year: Never true    Ran Out of Food in the Last Year: Never true  Transportation Needs: No Transportation Needs (09/16/2021)   PRAPARE - Administrator, Civil Service (Medical): No    Lack of Transportation (Non-Medical): No  Physical Activity: Sufficiently Active (09/16/2021)   Exercise Vital Sign    Days of Exercise per Week: 7 days    Minutes of Exercise per Session: 30 min  Stress: No Stress Concern Present (09/16/2021)   [employer] of Occupational Health - Occupational Stress Questionnaire    Feeling of Stress : Not at all  Social Connections: Socially Isolated (09/16/2021)   Social Connection and Isolation Panel [NHANES]    Frequency of Communication with Friends and Family: More than three times a week    Frequency of Social Gatherings with Friends and Family: Three times a week    Attends Religious Services: Never    Active Member of Clubs or Organizations: No    Attends Banker Meetings: Never    Marital Status: Never married    Review of Systems: A 12 point ROS discussed and pertinent positives are indicated in the HPI above.  All other systems  are negative.  Review of Systems  Constitutional:  Negative for activity change, appetite change, chills, fatigue and fever.  Respiratory:  Positive for shortness of breath.   Cardiovascular:  Negative for chest pain and leg swelling.  Gastrointestinal:  Positive for abdominal distention. Negative for abdominal pain, nausea and vomiting.  Genitourinary:  Positive for flank pain. Negative for dysuria and hematuria.  Neurological:  Positive for headaches. Negative for dizziness and weakness.    Vital Signs: BP (!) 156/78 (BP Location: Right Arm)   Pulse 81   Temp 98.1 F (36.7 C) (Oral)   SpO2 100%     Physical Exam Vitals reviewed.  Constitutional:      General: She is not in acute distress.    Appearance: Normal appearance. She is not ill-appearing.  HENT:     Head: Normocephalic and atraumatic.     Mouth/Throat:     Mouth: Mucous membranes are dry.     Pharynx: Oropharynx is clear.  Eyes:     Extraocular Movements: Extraocular movements intact.     Pupils: Pupils are equal, round, and reactive to light.  Cardiovascular:     Rate and Rhythm: Normal rate and regular rhythm.     Pulses: Normal pulses.     Heart sounds: Normal heart sounds.  Pulmonary:     Effort: Pulmonary effort is normal. No respiratory distress.     Breath sounds: Normal breath sounds.  Abdominal:     General: Bowel sounds are normal. There is no distension.     Palpations: Abdomen is soft.     Tenderness: There is no abdominal tenderness. There is no guarding.  Musculoskeletal:     Right lower leg: No edema.     Left lower leg: No edema.  Skin:    General: Skin is warm and dry.  Neurological:     Mental Status: She is alert and oriented to person, place, and time.  Psychiatric:        Mood and Affect: Mood normal.        Behavior: Behavior normal.        Thought Content: Thought content normal.  Judgment: Judgment normal.     Imaging: No results found.  Labs:  CBC: Recent Labs     03/10/22 1033 04/08/22 0907 06/24/22 0842  WBC 8.0 6.9 5.5  HGB 11.1* 11.4* 12.1  HCT 33.9* 34.1* 38.2  PLT 375.0 331 339    COAGS: No results for input(s): "INR", "APTT" in the last 8760 hours.  BMP: Recent Labs    03/10/22 1033 03/17/22 1540 04/08/22 0907 06/24/22 0842  NA 135 136 138 137  K 4.3 4.8 4.4 5.0  CL 103 103 108 107  CO2 25 22 22 24   GLUCOSE 95 99 99 111*  BUN 35* 43* 35* 34*  CALCIUM 9.2 9.3 9.2 9.5  CREATININE 2.20* 2.08* 2.15* 2.16*  GFRNONAA  --   --  24* 24*    LIVER FUNCTION TESTS: Recent Labs    03/10/22 1033  BILITOT 0.3  AST 14  ALT 10  ALKPHOS 74  PROT 7.2  ALBUMIN 3.8    TUMOR MARKERS: No results for input(s): "AFPTM", "CEA", "CA199", "CHROMGRNA" in the last 8760 hours.  Assessment and Plan: History of anemia, COPD, DJD of cervical spine, HTN, tobacco abuse and chronic pelvic pain. Pt underwent renal 03/12/22 for decreased renal function 03/25/22 that showed marked right-sided hydroureteronephrosis secondary to proximal right ureteric stone and moderate left-sided hydronephrosis with large volume left renal collecting system calculi. Pt was referred to urology d/t results and creatinine of 2.16. Pt referred by Dr. 05/25/22 to IR for left PCN placement for same day urological procedure for left renal stone.    Pt resting on stretcher.   She is A&O, calm and pleasant.  She is NPO per order.   Pt denies the use of blood thinning medications.   Risks and benefits of left PCN for placement urological intervention was discussed with the patient including, but not limited to, infection, bleeding, significant bleeding causing loss or decrease in renal function or damage to adjacent structures.   All of the patient's questions were answered, patient is agreeable to proceed.  Consent signed and in chart.   Thank you for this interesting consult.  I greatly enjoyed meeting Jinnifer Montejano and look forward to participating in their care.  A copy of this  report was sent to the requesting provider on this date.  Electronically Signed: Georgetta Haber, NP 07/01/2022, 8:04 AM   I spent a total of 20 minutes in face to face in clinical consultation, greater than 50% of which was counseling/coordinating care for left renal stone.

## 2022-06-30 NOTE — Patient Instructions (Signed)
You are given the note today for ok for surgury tomorrow  Please start the hydralazine 50 mg three times per day starting tonight and in the AM, in addition to your prior meds  Ok to take the xanax 0.5 mg with one tonight AND one in the AM which should also help with the pressure  Please continue all other medications as before, and refills have been done if requested.  Please have the pharmacy call with any other refills you may need.  Please keep your appointments with your specialists as you may have planned  Good luck with your procedure in the AM!

## 2022-06-30 NOTE — Progress Notes (Signed)
Patient ID: Kathy Vargas, female   DOB: [birth year], 71 y.o.   MRN: [id number]        Chief Complaint: follow up HTN uncontrolled, anxiety, left renal stone, hyperglycemia       HPI:  Kathy Vargas is a 71 y.o. female here with elevated BP in 170-180 range after screening exam for procedure scheduled tomorrow, and pt with high anxiety concerned that it might be cancelled if BP is uncontrolled.  BP has been increased recently, maybe associated with left renal stone.  No current pain, and Denies urinary symptoms such as dysuria, frequency, urgency, flank pain, hematuria or n/v, fever, chills.  Baseline BP recently has been about 150s. Denies worsening depressive symptoms, suicidal ideation, or panic; has ongoing anxiety, very high today when thinking her procedure tomorrow might be cancelled.    Wt Readings from Last 3 Encounters:  07/01/22 122 lb 7.1 oz (55.5 kg)  06/30/22 122 lb (55.3 kg)  06/24/22 121 lb (54.9 kg)   BP Readings from Last 3 Encounters:  07/02/22 (!) 120/59  07/01/22 (!) 166/92  06/30/22 (!) 152/84         Past Medical History:  Diagnosis Date   ABDOMINAL TENDERNESS 01/21/2011   ACUTE GINGIVITIS NONPLAQUE INDUCED 06/11/2010   ADD 09/05/2008   Alcohol abuse, in remission 09/27/2007   ALLERGIC RHINITIS 03/02/2009   Anemia    ANXIETY 07/02/2007   BACK PAIN 05/19/2009   CHRONIC OBSTRUCTIVE PULMONARY DISEASE, ACUTE EXACERBATION 08/06/2009   COPD 07/02/2007   DEGENERATIVE JOINT DISEASE, CERVICAL SPINE 07/02/2007   DEPRESSION 07/02/2007   Dyspnea    GASTROENTERITIS, VIRAL 11/29/2010   GERD 01/21/2011   Headache(784.0) 07/02/2007   History of kidney stones    Hypertension    HYPOTHYROIDISM 07/02/2007   Lumbar disc disease    Lumbar pain with radiation down right leg 06/01/2011   OSTEOARTHRITIS 07/02/2007   Other and unspecified ovarian cyst 05/19/2009   PAIN, CHRONIC, DUE TO TRAUMA 07/02/2007   PELVIC  PAIN 03/29/2010   UTI 05/19/2009   Vitamin D insufficiency     Past Surgical History:  Procedure Laterality Date   ABDOMINAL HYSTERECTOMY     CATARACT EXTRACTION W/ INTRAOCULAR LENS IMPLANT Bilateral    CHOLECYSTECTOMY     eyebrow lift     IR URETERAL STENT LEFT NEW ACCESS W/O SEP NEPHROSTOMY CATH  07/01/2022   LIPOSUCTION     LUMBAR DISC SURGERY     L4, L5   URETEROSCOPY WITH HOLMIUM LASER LITHOTRIPSY Right 04/12/2022   Procedure: URETEROSCOPY WITH HOLMIUM LASER LITHOTRIPSY, STENT PLACEMENT;  Surgeon: Despina Arias, MD;  Location: [facility];  Service: Urology;  Laterality: Right;    reports that she has been smoking cigarettes. She started smoking about 59 years ago. She has a 28.00 pack-year smoking history. She has never used smokeless tobacco. She reports that she does not currently use alcohol. She reports that she does not use drugs. family history includes Alcohol abuse in an other family member; Anxiety disorder in an other family member; Coronary artery disease in an other family member; Depression in an other family member; Stroke in an other family member. Allergies  Allergen Reactions   Morphine Nausea And Vomiting   Ceftin [Cefuroxime]     Nausea and vomiting   Current Outpatient Medications on File Prior to Visit  Medication Sig Dispense Refill   albuterol (VENTOLIN HFA) 108 (90 Base) MCG/ACT inhaler Inhale 2 puffs into the lungs every 6 (six) hours as needed for wheezing or shortness  of breath. 18 g 11   amphetamine-dextroamphetamine (ADDERALL) 30 MG tablet Take 1 tablet by mouth 2 times daily. 60 tablet 0   Ascorbic Acid (VITAMIN C PO) Take 1 tablet by mouth in the morning.     Biotin w/ Vitamins C & E (HAIR/SKIN/NAILS PO) Take 1 tablet by mouth daily.     Calcium-Magnesium-Zinc (CAL-MAG-ZINC PO) Take 1 tablet by mouth at bedtime.     cetirizine (ZYRTEC) 10 MG tablet Take 10 mg by mouth daily as needed for allergies.     Cholecalciferol (VITAMIN D3 PO) Take 1 tablet by mouth in the morning.     diclofenac (VOLTAREN) 75 MG EC  tablet Take 1 tablet (75 mg total) by mouth 2 (two) times daily. (Patient taking differently: Take 75 mg by mouth 2 (two) times daily as needed (Back pain).) 60 tablet 0   fluticasone (CUTIVATE) 0.005 % ointment Apply to the skin 2 times daily if needed 30 g 1   fluticasone-salmeterol (ADVAIR DISKUS) 250-50 MCG/ACT AEPB Inhale 1 puff into the lungs 2 (two) times daily as needed (Asthma).     nystatin cream (MYCOSTATIN) Apply to lips once a day 30 g 1   pantoprazole (PROTONIX) 40 MG tablet Take 1 tablet (40 mg total) by mouth daily. 90 tablet 2   ciprofloxacin (CIPRO) 500 MG tablet Take 1 tablet by mouth 2  times daily for 6 doses. Take day prior, day of and day after ureteral stent removal in the office 6 tablet 0   docusate sodium (COLACE) 100 MG capsule Take 1 capsule (100 mg total) by mouth daily as needed for up to 30 doses. 30 capsule 0   [START ON 07/05/2022] irbesartan (AVAPRO) 300 MG tablet Take 1 tablet by mouth daily. 90 tablet 3   iron polysaccharides (NU-IRON) 150 MG capsule Take 1 capsule (150 mg total) by mouth daily. (Patient not taking: Reported on 06/17/2022) 90 capsule 1   oxyCODONE-acetaminophen (PERCOCET) 5-325 MG tablet Take 1 tablet by mouth every 4 hours as needed for up to 18 doses for severe pain. 18 tablet 0   [DISCONTINUED] FLUoxetine (PROZAC) 20 MG capsule Take 1 capsule (20 mg total) by mouth daily. 30 capsule 11   Current Facility-Administered Medications on File Prior to Visit  Medication Dose Route Frequency Provider Last Rate Last Admin   0.9 %  sodium chloride infusion  250 mL Intravenous PRN Jannifer Hick, MD       acetaminophen (TYLENOL) tablet 650 mg  650 mg Oral Q4H PRN Jannifer Hick, MD       albuterol (PROVENTIL) (2.5 MG/3ML) 0.083% nebulizer solution 2.5 mg  2.5 mg Nebulization Q6H PRN Lynden Ang, RPH       ALPRAZolam Prudy Feeler) tablet 0.5 mg  0.5 mg Oral BID PRN Jannifer Hick, MD       diphenhydrAMINE (BENADRYL) injection 12.5 mg  12.5 mg Intravenous Q6H  PRN Jannifer Hick, MD       Or   diphenhydrAMINE (BENADRYL) 12.5 MG/5ML elixir 12.5 mg  12.5 mg Oral Q6H PRN Jannifer Hick, MD       docusate sodium (COLACE) capsule 100 mg  100 mg Oral BID Jannifer Hick, MD   100 mg at 07/02/22 6283   hydrALAZINE (APRESOLINE) tablet 50 mg  50 mg Oral TID Jannifer Hick, MD   50 mg at 07/02/22 1517   HYDROmorphone (DILAUDID) injection 0.5-1 mg  0.5-1 mg Intravenous Q2H PRN Jannifer Hick, MD   1  mg at 07/02/22 0527   irbesartan (AVAPRO) tablet 300 mg  300 mg Oral Daily Jannifer Hick, MD   300 mg at 07/02/22 0910   iron polysaccharides (NIFEREX) capsule 150 mg  150 mg Oral Daily Jannifer Hick, MD   150 mg at 07/02/22 0910   loratadine (CLARITIN) tablet 10 mg  10 mg Oral Daily Jannifer Hick, MD   10 mg at 07/02/22 0910   mometasone-formoterol (DULERA) 200-5 MCG/ACT inhaler 2 puff  2 puff Inhalation BID Jannifer Hick, MD       ondansetron [facility]) injection 4 mg  4 mg Intravenous Q4H PRN Jannifer Hick, MD   4 mg at 07/02/22 0054   oxybutynin (DITROPAN-XL) 24 hr tablet 10 mg  10 mg Oral Daily Jannifer Hick, MD   10 mg at 07/02/22 0910   oxyCODONE-acetaminophen (PERCOCET/ROXICET) 5-325 MG per tablet 1-2 tablet  1-2 tablet Oral Q4H PRN Jannifer Hick, MD   2 tablet at 07/02/22 0808   pantoprazole (PROTONIX) EC tablet 40 mg  40 mg Oral Daily Jannifer Hick, MD   40 mg at 07/02/22 0910   promethazine (PHENERGAN) 12.5 mg in sodium chloride 0.9 % 50 mL IVPB  12.5 mg Intravenous Q6H PRN Jannifer Hick, MD   Stopped at 07/01/22 1852   sodium chloride flush (NS) 0.9 % injection 3 mL  3 mL Intravenous Q12H Jannifer Hick, MD   3 mL at 07/02/22 0911   sodium chloride flush (NS) 0.9 % injection 3 mL  3 mL Intravenous PRN Jannifer Hick, MD       zolpidem (AMBIEN) tablet 5 mg  5 mg Oral QHS PRN Jannifer Hick, MD            ROS:  All others reviewed and negative.  Objective        PE:  BP (!) 152/84 (BP Location: Right Arm, Patient Position: Sitting, Cuff Size:  Normal)   Pulse 82   Temp 97.9 F (36.6 C) (Oral)   Ht 5\' 5"  (1.651 m)   Wt 122 lb (55.3 kg)   SpO2 98%   BMI 20.30 kg/m                 Constitutional: Pt appears in NAD               HENT: Head: NCAT.                Right Ear: External ear normal.                 Left Ear: External ear normal.                Eyes: . Pupils are equal, round, and reactive to light. Conjunctivae and EOM are normal               Nose: without d/c or deformity               Neck: Neck supple. Gross normal ROM               Cardiovascular: Normal rate and regular rhythm.                 Pulmonary/Chest: Effort normal and breath sounds without rales or wheezing.                Abd:  Soft, NT, ND, + BS, no organomegaly  Neurological: Pt is alert. At baseline orientation, motor grossly intact               Skin: Skin is warm. No rashes, no other new lesions, LE edema - none               Psychiatric: Pt behavior is normal with mild agitation, tearful and beside herself with nervous   Micro: none  Cardiac tracings I have personally interpreted today:  none  Pertinent Radiological findings (summarize): none   Lab Results  Component Value Date   WBC 14.6 (H) 07/02/2022   HGB 11.1 (L) 07/02/2022   HCT 36.7 07/02/2022   PLT 314 07/02/2022   GLUCOSE 120 (H) 07/02/2022   CHOL 214 (H) 05/29/2019   TRIG 70.0 05/29/2019   HDL 70.90 05/29/2019   LDLDIRECT 121.4 12/02/2010   LDLCALC 129 (H) 05/29/2019   ALT 10 03/10/2022   AST 14 03/10/2022   NA 137 07/02/2022   K 5.7 (H) 07/02/2022   CL 110 07/02/2022   CREATININE 2.26 (H) 07/02/2022   BUN 37 (H) 07/02/2022   CO2 20 (L) 07/02/2022   TSH 1.43 05/29/2019   INR 0.9 07/01/2022   HGBA1C 5.9 05/29/2019   Assessment/Plan:  Kathy Vargas is a 71 y.o. White or Caucasian [1] female with  has a past medical history of ABDOMINAL TENDERNESS (01/21/2011), ACUTE GINGIVITIS NONPLAQUE INDUCED (06/11/2010), ADD (09/05/2008), Alcohol abuse, in  remission (09/27/2007), ALLERGIC RHINITIS (03/02/2009), Anemia, ANXIETY (07/02/2007), BACK PAIN (05/19/2009), CHRONIC OBSTRUCTIVE PULMONARY DISEASE, ACUTE EXACERBATION (08/06/2009), COPD (07/02/2007), DEGENERATIVE JOINT DISEASE, CERVICAL SPINE (07/02/2007), DEPRESSION (07/02/2007), Dyspnea, GASTROENTERITIS, VIRAL (11/29/2010), GERD (01/21/2011), Headache(784.0) (07/02/2007), History of kidney stones, Hypertension, HYPOTHYROIDISM (07/02/2007), Lumbar disc disease, Lumbar pain with radiation down right leg (06/01/2011), OSTEOARTHRITIS (07/02/2007), Other and unspecified ovarian cyst (05/19/2009), PAIN, CHRONIC, DUE TO TRAUMA (07/02/2007), PELVIC  PAIN (03/29/2010), UTI (05/19/2009), and Vitamin D insufficiency.  Smoker Counseled to quit, pt not ready  Anxiety state Mild to mod situationally worse - for xanax 0.5 but prn refill and restart tonight         Hyperglycemia Lab Results  Component Value Date   HGBA1C 5.9 05/29/2019   Stable, pt to continue current medical treatment  - diet, wt control, excercise   Essential hypertension BP Readings from Last 3 Encounters:  07/02/22 (!) 120/59  07/01/22 (!) 166/92  06/30/22 (!) 152/84   Stable, pt to continue medical treatment avapro 300 mg qd, and add hdyralzine 50 tid start tonight   Left renal stone For f/u procedure tonight  Followup: No follow-ups on file.  Oliver Barre, MD 07/02/2022 7:05 PM [facility]

## 2022-07-01 ENCOUNTER — Other Ambulatory Visit (HOSPITAL_COMMUNITY): Payer: Self-pay

## 2022-07-01 ENCOUNTER — Ambulatory Visit (HOSPITAL_COMMUNITY)
Admission: RE | Admit: 2022-07-01 | Discharge: 2022-07-01 | Disposition: A | Discharge status: Home / Self Care | Payer: Medicare Other | Source: Ambulatory Visit | Attending: Urology | Admitting: Urology

## 2022-07-01 ENCOUNTER — Encounter (HOSPITAL_COMMUNITY)
Admission: RE | Disposition: A | Discharge status: Home / Self Care | Payer: Self-pay | Source: Ambulatory Visit | Attending: Urology

## 2022-07-01 ENCOUNTER — Ambulatory Visit (HOSPITAL_BASED_OUTPATIENT_CLINIC_OR_DEPARTMENT_OTHER): Payer: Medicare Other | Admitting: Anesthesiology

## 2022-07-01 ENCOUNTER — Ambulatory Visit (HOSPITAL_COMMUNITY): Payer: Medicare Other | Admitting: Emergency Medicine

## 2022-07-01 ENCOUNTER — Ambulatory Visit (HOSPITAL_COMMUNITY): Payer: Medicare Other

## 2022-07-01 ENCOUNTER — Other Ambulatory Visit: Payer: Self-pay

## 2022-07-01 ENCOUNTER — Observation Stay (HOSPITAL_COMMUNITY)
Admission: RE | Admit: 2022-07-01 | Discharge: 2022-07-02 | Disposition: A | Discharge status: Home / Self Care | Payer: Medicare Other | Source: Ambulatory Visit | Attending: Urology | Admitting: Urology

## 2022-07-01 ENCOUNTER — Encounter (HOSPITAL_COMMUNITY): Payer: Self-pay

## 2022-07-01 ENCOUNTER — Encounter (HOSPITAL_COMMUNITY): Payer: Self-pay | Admitting: Urology

## 2022-07-01 DIAGNOSIS — E039 Hypothyroidism, unspecified: Secondary | ICD-10-CM | POA: Insufficient documentation

## 2022-07-01 DIAGNOSIS — N200 Calculus of kidney: Secondary | ICD-10-CM

## 2022-07-01 DIAGNOSIS — I10 Essential (primary) hypertension: Secondary | ICD-10-CM | POA: Diagnosis not present

## 2022-07-01 DIAGNOSIS — J449 Chronic obstructive pulmonary disease, unspecified: Secondary | ICD-10-CM | POA: Diagnosis not present

## 2022-07-01 DIAGNOSIS — Z79899 Other long term (current) drug therapy: Secondary | ICD-10-CM | POA: Diagnosis not present

## 2022-07-01 DIAGNOSIS — F17210 Nicotine dependence, cigarettes, uncomplicated: Secondary | ICD-10-CM | POA: Insufficient documentation

## 2022-07-01 DIAGNOSIS — N202 Calculus of kidney with calculus of ureter: Principal | ICD-10-CM | POA: Insufficient documentation

## 2022-07-01 HISTORY — PX: CYSTOSCOPY/URETEROSCOPY/HOLMIUM LASER/STENT PLACEMENT: SHX6546

## 2022-07-01 HISTORY — PX: NEPHROLITHOTOMY: SHX5134

## 2022-07-01 HISTORY — PX: IR URETERAL STENT LEFT NEW ACCESS W/O SEP NEPHROSTOMY CATH: IMG6075

## 2022-07-01 LAB — BASIC METABOLIC PANEL
Anion gap: 8 (ref 5–15)
Anion gap: 7 (ref 5–15)
BUN: 37 mg/dL — ABNORMAL HIGH (ref 8–23)
BUN: 37 mg/dL — ABNORMAL HIGH (ref 8–23)
CO2: 19 mmol/L — ABNORMAL LOW (ref 22–32)
CO2: 19 mmol/L — ABNORMAL LOW (ref 22–32)
Calcium: 8.7 mg/dL — ABNORMAL LOW (ref 8.9–10.3)
Calcium: 9.2 mg/dL (ref 8.9–10.3)
Chloride: 111 mmol/L (ref 98–111)
Chloride: 112 mmol/L — ABNORMAL HIGH (ref 98–111)
Creatinine, Ser: 2.15 mg/dL — ABNORMAL HIGH (ref 0.44–1.00)
Creatinine, Ser: 2.1 mg/dL — ABNORMAL HIGH (ref 0.44–1.00)
GFR, Estimated: 24 mL/min — ABNORMAL LOW (ref >60)
GFR, Estimated: 25 mL/min — ABNORMAL LOW (ref >60)
Glucose, Bld: 95 mg/dL (ref 70–99)
Glucose, Bld: 103 mg/dL — ABNORMAL HIGH (ref 70–99)
Potassium: 4.6 mmol/L (ref 3.5–5.1)
Potassium: 4.9 mmol/L (ref 3.5–5.1)
Sodium: 138 mmol/L (ref 135–145)
Sodium: 138 mmol/L (ref 135–145)

## 2022-07-01 LAB — CBC WITH DIFFERENTIAL/PLATELET
Abs Immature Granulocytes: 0.03 10*3/uL (ref 0.00–0.07)
Basophils Absolute: 0 10*3/uL (ref 0.0–0.1)
Basophils Relative: 1 %
Eosinophils Absolute: 0.2 10*3/uL (ref 0.0–0.5)
Eosinophils Relative: 4 %
HCT: 33.7 % — ABNORMAL LOW (ref 36.0–46.0)
Hemoglobin: 10.5 g/dL — ABNORMAL LOW (ref 12.0–15.0)
Immature Granulocytes: 1 %
Lymphocytes Relative: 20 %
Lymphs Abs: 1.1 10*3/uL (ref 0.7–4.0)
MCH: 28.5 pg (ref 26.0–34.0)
MCHC: 31.2 g/dL (ref 30.0–36.0)
MCV: 91.3 fL (ref 80.0–100.0)
Monocytes Absolute: 0.4 10*3/uL (ref 0.1–1.0)
Monocytes Relative: 6 %
Neutro Abs: 3.9 10*3/uL (ref 1.7–7.7)
Neutrophils Relative %: 68 %
Platelets: 267 10*3/uL (ref 150–400)
RBC: 3.69 MIL/uL — ABNORMAL LOW (ref 3.87–5.11)
RDW: 13 % (ref 11.5–15.5)
WBC: 5.6 10*3/uL (ref 4.0–10.5)
nRBC: 0 % (ref 0.0–0.2)

## 2022-07-01 LAB — CBC
HCT: 42.9 % (ref 36.0–46.0)
Hemoglobin: 13.2 g/dL (ref 12.0–15.0)
MCH: 28.2 pg (ref 26.0–34.0)
MCHC: 30.8 g/dL (ref 30.0–36.0)
MCV: 91.7 fL (ref 80.0–100.0)
Platelets: 289 10*3/uL (ref 150–400)
RBC: 4.68 MIL/uL (ref 3.87–5.11)
RDW: 13.2 % (ref 11.5–15.5)
WBC: 12.2 10*3/uL — ABNORMAL HIGH (ref 4.0–10.5)
nRBC: 0 % (ref 0.0–0.2)

## 2022-07-01 LAB — PROTIME-INR
INR: 0.9 (ref 0.8–1.2)
Prothrombin Time: 12.2 seconds (ref 11.4–15.2)

## 2022-07-01 SURGERY — NEPHROLITHOTOMY PERCUTANEOUS
Anesthesia: General | Laterality: Left

## 2022-07-01 MED ORDER — POLYSACCHARIDE IRON COMPLEX 150 MG PO CAPS
150.0000 mg | ORAL_CAPSULE | Freq: Every day | ORAL | Status: DC
  Administered 2022-07-02: 150 mg via ORAL
  Filled 2022-07-01: qty 1

## 2022-07-01 MED ORDER — HYDRALAZINE HCL 50 MG PO TABS
50.0000 mg | ORAL_TABLET | Freq: Three times a day (TID) | ORAL | Status: DC
  Administered 2022-07-01 – 2022-07-02 (×3): 50 mg via ORAL
  Filled 2022-07-01 (×3): qty 1

## 2022-07-01 MED ORDER — SODIUM CHLORIDE 0.9 % IV SOLN
2.0000 g | INTRAVENOUS | Status: AC
  Administered 2022-07-01: 2 g via INTRAVENOUS
  Filled 2022-07-01: qty 2000

## 2022-07-01 MED ORDER — ZOLPIDEM TARTRATE 5 MG PO TABS: 5.0000 mg | ORAL_TABLET | Freq: Every evening | ORAL | Status: DC | PRN

## 2022-07-01 MED ORDER — SODIUM CHLORIDE 0.9 % IV SOLN: 250.0000 mL | INTRAVENOUS | Status: DC | PRN

## 2022-07-01 MED ORDER — MOMETASONE FURO-FORMOTEROL FUM 200-5 MCG/ACT IN AERO
2.0000 | INHALATION_SPRAY | Freq: Two times a day (BID) | RESPIRATORY_TRACT | Status: DC
  Filled 2022-07-01: qty 8.8

## 2022-07-01 MED ORDER — DEXAMETHASONE SODIUM PHOSPHATE 10 MG/ML IJ SOLN
INTRAMUSCULAR | Status: DC | PRN
  Administered 2022-07-01: 10 mg via INTRAVENOUS

## 2022-07-01 MED ORDER — SODIUM CHLORIDE 0.9% FLUSH
3.0000 mL | Freq: Two times a day (BID) | INTRAVENOUS | Status: DC
  Administered 2022-07-01 – 2022-07-02 (×2): 3 mL via INTRAVENOUS

## 2022-07-01 MED ORDER — ALBUTEROL SULFATE (2.5 MG/3ML) 0.083% IN NEBU: 2.5000 mg | INHALATION_SOLUTION | Freq: Four times a day (QID) | RESPIRATORY_TRACT | Status: DC | PRN

## 2022-07-01 MED ORDER — LIDOCAINE HCL (PF) 2 % IJ SOLN
INTRAMUSCULAR | Status: AC
  Filled 2022-07-01: qty 5

## 2022-07-01 MED ORDER — ALBUTEROL SULFATE HFA 108 (90 BASE) MCG/ACT IN AERS
2.0000 | INHALATION_SPRAY | Freq: Four times a day (QID) | RESPIRATORY_TRACT | Status: DC | PRN
Start: 2022-07-01 — End: 2022-07-01

## 2022-07-01 MED ORDER — IRBESARTAN 300 MG PO TABS
300.0000 mg | ORAL_TABLET | Freq: Every day | ORAL | Status: DC
  Administered 2022-07-02: 300 mg via ORAL
  Filled 2022-07-01: qty 1

## 2022-07-01 MED ORDER — GENTAMICIN IN SALINE 1.6-0.9 MG/ML-% IV SOLN
80.0000 mg | Freq: Once | INTRAVENOUS | Status: AC
  Administered 2022-07-01: 80 mg via INTRAVENOUS
  Filled 2022-07-01: qty 50

## 2022-07-01 MED ORDER — OXYCODONE-ACETAMINOPHEN 5-325 MG PO TABS
1.0000 | ORAL_TABLET | ORAL | 0 refills | Status: DC | PRN
  Filled 2022-07-01: qty 18, 3d supply, fill #0

## 2022-07-01 MED ORDER — SODIUM CHLORIDE 0.9 % IV SOLN
12.5000 mg | Freq: Four times a day (QID) | INTRAVENOUS | Status: DC | PRN
  Administered 2022-07-01: 12.5 mg via INTRAVENOUS
  Filled 2022-07-01: qty 12.5

## 2022-07-01 MED ORDER — CHLORHEXIDINE GLUCONATE 0.12 % MT SOLN
15.0000 mL | Freq: Once | OROMUCOSAL | Status: AC
  Administered 2022-07-01: 15 mL via OROMUCOSAL

## 2022-07-01 MED ORDER — PHENYLEPHRINE HCL (PRESSORS) 10 MG/ML IV SOLN
INTRAVENOUS | Status: DC | PRN
  Administered 2022-07-01 (×2): 80 ug via INTRAVENOUS

## 2022-07-01 MED ORDER — CIPROFLOXACIN HCL 500 MG PO TABS
500.0000 mg | ORAL_TABLET | Freq: Two times a day (BID) | ORAL | 0 refills | Status: AC
  Filled 2022-07-01: qty 6, 3d supply, fill #0

## 2022-07-01 MED ORDER — SODIUM CHLORIDE 0.9 % IR SOLN
Status: DC | PRN
  Administered 2022-07-01: 18000 mL via INTRAVESICAL

## 2022-07-01 MED ORDER — ACETAMINOPHEN 500 MG PO TABS
1000.0000 mg | ORAL_TABLET | Freq: Once | ORAL | Status: AC
  Administered 2022-07-01: 1000 mg via ORAL
  Filled 2022-07-01: qty 2

## 2022-07-01 MED ORDER — LIDOCAINE HCL (CARDIAC) PF 100 MG/5ML IV SOSY
PREFILLED_SYRINGE | INTRAVENOUS | Status: DC | PRN
  Administered 2022-07-01: 50 mg via INTRAVENOUS

## 2022-07-01 MED ORDER — ONDANSETRON HCL 4 MG/2ML IJ SOLN
INTRAMUSCULAR | Status: DC | PRN
  Administered 2022-07-01: 4 mg via INTRAVENOUS

## 2022-07-01 MED ORDER — LACTATED RINGERS IV SOLN: INTRAVENOUS | Status: DC

## 2022-07-01 MED ORDER — MIDAZOLAM HCL 2 MG/2ML IJ SOLN
INTRAMUSCULAR | Status: AC
  Filled 2022-07-01: qty 2

## 2022-07-01 MED ORDER — ONDANSETRON HCL 4 MG/2ML IJ SOLN
4.0000 mg | Freq: Once | INTRAMUSCULAR | Status: AC | PRN
  Administered 2022-07-01: 4 mg via INTRAVENOUS

## 2022-07-01 MED ORDER — ROCURONIUM BROMIDE 10 MG/ML (PF) SYRINGE
PREFILLED_SYRINGE | INTRAVENOUS | Status: AC
  Filled 2022-07-01: qty 10

## 2022-07-01 MED ORDER — POTASSIUM CHLORIDE IN NACL 20-0.45 MEQ/L-% IV SOLN
INTRAVENOUS | Status: DC
  Filled 2022-07-01 (×3): qty 1000

## 2022-07-01 MED ORDER — HYDROMORPHONE HCL 1 MG/ML IJ SOLN
INTRAMUSCULAR | Status: AC
  Filled 2022-07-01: qty 1

## 2022-07-01 MED ORDER — FENTANYL CITRATE (PF) 100 MCG/2ML IJ SOLN
INTRAMUSCULAR | Status: AC
  Filled 2022-07-01: qty 2

## 2022-07-01 MED ORDER — OXYCODONE HCL 5 MG/5ML PO SOLN: 5.0000 mg | Freq: Once | ORAL | Status: DC | PRN

## 2022-07-01 MED ORDER — ONDANSETRON HCL 4 MG/2ML IJ SOLN
INTRAMUSCULAR | Status: AC
Start: 2022-07-01 — End: ?
  Filled 2022-07-01: qty 2

## 2022-07-01 MED ORDER — OXYBUTYNIN CHLORIDE ER 5 MG PO TB24
10.0000 mg | ORAL_TABLET | Freq: Every day | ORAL | Status: DC
  Administered 2022-07-01 – 2022-07-02 (×2): 10 mg via ORAL
  Filled 2022-07-01 (×2): qty 2

## 2022-07-01 MED ORDER — HYDROMORPHONE HCL 1 MG/ML IJ SOLN
0.2500 mg | INTRAMUSCULAR | Status: DC | PRN
  Administered 2022-07-01 (×2): 0.5 mg via INTRAVENOUS

## 2022-07-01 MED ORDER — PANTOPRAZOLE SODIUM 40 MG PO TBEC
40.0000 mg | DELAYED_RELEASE_TABLET | Freq: Every day | ORAL | Status: DC
Start: 2022-07-01 — End: 2022-07-02
  Administered 2022-07-02: 40 mg via ORAL
  Filled 2022-07-01: qty 1

## 2022-07-01 MED ORDER — FENTANYL CITRATE (PF) 100 MCG/2ML IJ SOLN
INTRAMUSCULAR | Status: AC | PRN
  Administered 2022-07-01: 50 ug via INTRAVENOUS

## 2022-07-01 MED ORDER — SUGAMMADEX SODIUM 200 MG/2ML IV SOLN
INTRAVENOUS | Status: DC | PRN
  Administered 2022-07-01: 200 mg via INTRAVENOUS

## 2022-07-01 MED ORDER — PROPOFOL 10 MG/ML IV BOLUS
INTRAVENOUS | Status: AC
  Filled 2022-07-01: qty 20

## 2022-07-01 MED ORDER — DOCUSATE SODIUM 100 MG PO CAPS
100.0000 mg | ORAL_CAPSULE | Freq: Every day | ORAL | 0 refills | Status: DC | PRN
  Filled 2022-07-01: qty 30, 30d supply, fill #0

## 2022-07-01 MED ORDER — MIDAZOLAM HCL 2 MG/2ML IJ SOLN
INTRAMUSCULAR | Status: AC | PRN
  Administered 2022-07-01: .5 mg via INTRAVENOUS

## 2022-07-01 MED ORDER — DEXAMETHASONE SODIUM PHOSPHATE 10 MG/ML IJ SOLN
INTRAMUSCULAR | Status: AC
  Filled 2022-07-01: qty 1

## 2022-07-01 MED ORDER — ACETAMINOPHEN 325 MG PO TABS: 650.0000 mg | ORAL_TABLET | ORAL | Status: DC | PRN

## 2022-07-01 MED ORDER — ONDANSETRON HCL 4 MG/2ML IJ SOLN
INTRAMUSCULAR | Status: AC
  Filled 2022-07-01: qty 2

## 2022-07-01 MED ORDER — ROCURONIUM BROMIDE 10 MG/ML (PF) SYRINGE
PREFILLED_SYRINGE | INTRAVENOUS | Status: DC | PRN
  Administered 2022-07-01: 80 mg via INTRAVENOUS

## 2022-07-01 MED ORDER — LIDOCAINE HCL 1 % IJ SOLN
INTRAMUSCULAR | Status: AC | PRN
  Administered 2022-07-01: 10 mL

## 2022-07-01 MED ORDER — ORAL CARE MOUTH RINSE: 15.0000 mL | Freq: Once | OROMUCOSAL | Status: AC

## 2022-07-01 MED ORDER — IOHEXOL 300 MG/ML  SOLN
50.0000 mL | Freq: Once | INTRAMUSCULAR | Status: AC | PRN
  Administered 2022-07-01: 10 mL

## 2022-07-01 MED ORDER — LEVOFLOXACIN IN D5W 500 MG/100ML IV SOLN
500.0000 mg | Freq: Once | INTRAVENOUS | Status: AC
  Administered 2022-07-01: 500 mg via INTRAVENOUS
  Filled 2022-07-01: qty 100

## 2022-07-01 MED ORDER — STERILE WATER FOR IRRIGATION IR SOLN
Status: DC | PRN
  Administered 2022-07-01: 50 mL

## 2022-07-01 MED ORDER — FENTANYL CITRATE (PF) 100 MCG/2ML IJ SOLN
INTRAMUSCULAR | Status: DC | PRN
Start: 2022-07-01 — End: 2022-07-01
  Administered 2022-07-01 (×2): 25 ug via INTRAVENOUS
  Administered 2022-07-01: 50 ug via INTRAVENOUS

## 2022-07-01 MED ORDER — SODIUM CHLORIDE 0.9% FLUSH: 3.0000 mL | INTRAVENOUS | Status: DC | PRN

## 2022-07-01 MED ORDER — HYDROMORPHONE HCL 1 MG/ML IJ SOLN
0.5000 mg | INTRAMUSCULAR | Status: DC | PRN
  Administered 2022-07-01 – 2022-07-02 (×4): 1 mg via INTRAVENOUS
  Filled 2022-07-01 (×4): qty 1

## 2022-07-01 MED ORDER — ACETAMINOPHEN 500 MG PO TABS
1000.0000 mg | ORAL_TABLET | Freq: Four times a day (QID) | ORAL | Status: AC
  Administered 2022-07-01 – 2022-07-02 (×4): 1000 mg via ORAL
  Filled 2022-07-01 (×4): qty 2

## 2022-07-01 MED ORDER — DIPHENHYDRAMINE HCL 12.5 MG/5ML PO ELIX: 12.5000 mg | ORAL_SOLUTION | Freq: Four times a day (QID) | ORAL | Status: DC | PRN

## 2022-07-01 MED ORDER — ONDANSETRON HCL 4 MG/2ML IJ SOLN
4.0000 mg | INTRAMUSCULAR | Status: DC | PRN
  Administered 2022-07-01 – 2022-07-02 (×2): 4 mg via INTRAVENOUS
  Filled 2022-07-01 (×2): qty 2

## 2022-07-01 MED ORDER — OXYCODONE HCL 5 MG PO TABS: 5.0000 mg | ORAL_TABLET | Freq: Once | ORAL | Status: DC | PRN

## 2022-07-01 MED ORDER — LORATADINE 10 MG PO TABS
10.0000 mg | ORAL_TABLET | Freq: Every day | ORAL | Status: DC
  Administered 2022-07-02: 10 mg via ORAL
  Filled 2022-07-01: qty 1

## 2022-07-01 MED ORDER — DIPHENHYDRAMINE HCL 50 MG/ML IJ SOLN: 12.5000 mg | Freq: Four times a day (QID) | INTRAMUSCULAR | Status: DC | PRN

## 2022-07-01 MED ORDER — OXYCODONE-ACETAMINOPHEN 5-325 MG PO TABS
1.0000 | ORAL_TABLET | ORAL | Status: DC | PRN
  Administered 2022-07-02: 2 via ORAL
  Filled 2022-07-01: qty 2

## 2022-07-01 MED ORDER — PROPOFOL 10 MG/ML IV BOLUS
INTRAVENOUS | Status: DC | PRN
  Administered 2022-07-01: 100 mg via INTRAVENOUS

## 2022-07-01 MED ORDER — ALPRAZOLAM 0.5 MG PO TABS
0.5000 mg | ORAL_TABLET | Freq: Two times a day (BID) | ORAL | Status: DC | PRN
Start: 2022-07-01 — End: 2022-07-02

## 2022-07-01 MED ORDER — IOHEXOL 300 MG/ML  SOLN
INTRAMUSCULAR | Status: DC | PRN
  Administered 2022-07-01: 31 mL

## 2022-07-01 MED ORDER — 0.9 % SODIUM CHLORIDE (POUR BTL) OPTIME
TOPICAL | Status: DC | PRN
  Administered 2022-07-01: 1000 mL

## 2022-07-01 MED ORDER — DOCUSATE SODIUM 100 MG PO CAPS
100.0000 mg | ORAL_CAPSULE | Freq: Two times a day (BID) | ORAL | Status: DC
  Administered 2022-07-01 – 2022-07-02 (×2): 100 mg via ORAL
  Filled 2022-07-01 (×2): qty 1

## 2022-07-01 MED ORDER — LIDOCAINE HCL 1 % IJ SOLN
INTRAMUSCULAR | Status: AC
  Filled 2022-07-01: qty 20

## 2022-07-01 SURGICAL SUPPLY — 85 items
AGENT HMST KT MTR STRL THRMB (HEMOSTASIS)
APL PRP STRL LF DISP 70% ISPRP (MISCELLANEOUS) ×2
APL SKNCLS STERI-STRIP NONHPOA (GAUZE/BANDAGES/DRESSINGS) ×1
BAG COUNTER SPONGE SURGICOUNT (BAG) IMPLANT
BAG DRN RND TRDRP ANRFLXCHMBR (UROLOGICAL SUPPLIES) ×1
BAG SPNG CNTER NS LX DISP (BAG)
BAG URINE DRAIN 2000ML AR STRL (UROLOGICAL SUPPLIES) ×1 IMPLANT
BAG URO CATCHER STRL LF (MISCELLANEOUS) ×3 IMPLANT
BASKET LASER NITINOL 1.9FR (BASKET) ×1 IMPLANT
BASKET ZERO TIP NITINOL 2.4FR (BASKET) IMPLANT
BENZOIN TINCTURE PRP APPL 2/3 (GAUZE/BANDAGES/DRESSINGS) ×3 IMPLANT
BLADE SURG 15 STRL LF DISP TIS (BLADE) ×2 IMPLANT
BLADE SURG 15 STRL SS (BLADE) ×2
BSKT STON RTRVL 120 1.9FR (BASKET) ×1
BSKT STON RTRVL ZERO TP 2.4FR (BASKET)
CATH COUDE 22FR 5CC (CATHETERS) IMPLANT
CATH FOLEY 2W COUNCIL 20FR 5CC (CATHETERS) ×1 IMPLANT
CATH FOLEY 2WAY SLVR  5CC 16FR (CATHETERS)
CATH FOLEY 2WAY SLVR 5CC 16FR (CATHETERS) ×2 IMPLANT
CATH MULTI PURPOSE 16FR DRAIN (CATHETERS) IMPLANT
CATH RIBBED COUDE 30CC (CATHETERS) ×2
CATH ROBINSON RED A/P 14FR (CATHETERS) ×2 IMPLANT
CATH ROBINSON RED A/P 20FR (CATHETERS) IMPLANT
CATH ULTRATHANE 14FR (CATHETERS) IMPLANT
CATH URETERAL DUAL LUMEN 10F (MISCELLANEOUS) ×3 IMPLANT
CATH URETL OPEN 5X70 (CATHETERS) ×3 IMPLANT
CATH URETL OPEN END 6FR 70 (CATHETERS) IMPLANT
CATH X-FORCE N30 NEPHROSTOMY (TUBING) IMPLANT
CHLORAPREP W/TINT 26 (MISCELLANEOUS) ×6 IMPLANT
CLOTH BEACON ORANGE TIMEOUT ST (SAFETY) ×3 IMPLANT
COVER SURGICAL LIGHT HANDLE (MISCELLANEOUS) ×1 IMPLANT
DRAPE C-ARM 42X120 X-RAY (DRAPES) ×3 IMPLANT
DRAPE LINGEMAN PERC (DRAPES) ×3 IMPLANT
DRAPE SHEET LG 3/4 BI-LAMINATE (DRAPES) IMPLANT
DRAPE SURG IRRIG POUCH 19X23 (DRAPES) ×3 IMPLANT
DRSG PAD ABDOMINAL 8X10 ST (GAUZE/BANDAGES/DRESSINGS) ×6 IMPLANT
DRSG TEGADERM 4X4.75 (GAUZE/BANDAGES/DRESSINGS) IMPLANT
DRSG TEGADERM 8X12 (GAUZE/BANDAGES/DRESSINGS) ×6 IMPLANT
EXTRACTOR STONE PERC NCIRCLE (MISCELLANEOUS) ×1 IMPLANT
FIBER LASER MOSES 200 DFL (Laser) IMPLANT
GAUZE SPONGE 4X4 12PLY STRL (GAUZE/BANDAGES/DRESSINGS) ×3 IMPLANT
GLOVE BIOGEL M 7.0 STRL (GLOVE) ×3 IMPLANT
GLOVE SURG LX 7.5 STRW (GLOVE) ×1
GLOVE SURG LX STRL 7.5 STRW (GLOVE) ×2 IMPLANT
GOWN STRL REUS W/ TWL XL LVL3 (GOWN DISPOSABLE) ×2 IMPLANT
GOWN STRL REUS W/TWL XL LVL3 (GOWN DISPOSABLE) ×2
GUIDEWIRE AMPLAZ .035X145 (WIRE) IMPLANT
GUIDEWIRE ANG ZIPWIRE 038X150 (WIRE) ×1 IMPLANT
GUIDEWIRE STR DUAL SENSOR (WIRE) ×9 IMPLANT
GUIDEWIRE SUPER STIFF (WIRE) ×3 IMPLANT
GUIDEWIRE ZIPWRE .038 STRAIGHT (WIRE) ×3 IMPLANT
IV SET EXTENSION CATH 6 NF (IV SETS) IMPLANT
KIT BASIN OR (CUSTOM PROCEDURE TRAY) ×3 IMPLANT
KIT PROBE 340X3.4XDISP GRN (MISCELLANEOUS) IMPLANT
KIT PROBE TRILOGY 3.4X340 (MISCELLANEOUS)
KIT PROBE TRILOGY 3.9X350 (MISCELLANEOUS) ×3 IMPLANT
KIT TURNOVER KIT A (KITS) IMPLANT
LASER FIB FLEXIVA PULSE ID 365 (Laser) IMPLANT
MANIFOLD NEPTUNE II (INSTRUMENTS) ×3 IMPLANT
NDL TROCAR 18X15 ECHO (NEEDLE) IMPLANT
NDL TROCAR 18X20 (NEEDLE) IMPLANT
NEEDLE TROCAR 18X15 ECHO (NEEDLE) IMPLANT
NEEDLE TROCAR 18X20 (NEEDLE) IMPLANT
NS IRRIG 1000ML POUR BTL (IV SOLUTION) ×3 IMPLANT
PACK CYSTO (CUSTOM PROCEDURE TRAY) ×3 IMPLANT
SHEATH PEELAWAY SET 9 (SHEATH) IMPLANT
SHEATH URETERAL 12FR 45CM (SHEATH) IMPLANT
SPONGE T-LAP 4X18 ~~LOC~~+RFID (SPONGE) ×3 IMPLANT
SURGIFLO W/THROMBIN 8M KIT (HEMOSTASIS) IMPLANT
SUT MNCRL AB 4-0 PS2 18 (SUTURE) ×3 IMPLANT
SUT SILK 0 FSL (SUTURE) ×1 IMPLANT
SUT VIC AB 4-0 PS2 18 (SUTURE) ×1 IMPLANT
SYR 10ML LL (SYRINGE) ×3 IMPLANT
SYR 20ML LL LF (SYRINGE) ×6 IMPLANT
SYR 50ML LL SCALE MARK (SYRINGE) ×3 IMPLANT
TOWEL OR 17X26 10 PK STRL BLUE (TOWEL DISPOSABLE) ×3 IMPLANT
TRACTIP FLEXIVA PULS ID 200XHI (Laser) IMPLANT
TRACTIP FLEXIVA PULSE ID 200 (Laser)
TRAY FOLEY MTR SLVR 16FR STAT (SET/KITS/TRAYS/PACK) ×3 IMPLANT
TUBE CONNECTING VINYL 14FR 30C (TUBING) IMPLANT
TUBING CONNECTING 10 (TUBING) ×6 IMPLANT
TUBING STONE CATCHER TRILOGY (MISCELLANEOUS) ×3 IMPLANT
TUBING UROLOGY SET (TUBING) ×3 IMPLANT
WATER STERILE IRR 1000ML POUR (IV SOLUTION) ×3 IMPLANT
WATER STERILE IRR 3000ML UROMA (IV SOLUTION) ×3 IMPLANT

## 2022-07-01 NOTE — Op Note (Signed)
Operative Note  Preoperative diagnosis:  1.  Left renal stones  Postoperative diagnosis: 1.  Left renal stones  Procedure(s): 1.  Left percutaneous nephrolithotomy (greater than 2 cm) 2. Dilation of left renal percutaneous tract under fluoroscopy 3. Left antegrade ureteroscopy with basket stone extraction 4. Left antegrade nephrostogram 5. Fluoroscopy time less than 1 hour with interpretation 6. Left ureteral stent placement 7. Left percutaneous nephrostomy tube placement  Surgeon: Jettie Pagan, MD  Assistants:  None  Anesthesia:  General  Complications:  None  EBL:  15ml  Specimens: 1. Stones for stone analysis to be done at [facility]  Drains/Catheters: 1.   A 16-French Foley. 2.   Left 20-French Councill tip nephrostomy tube 3. Left 6-French by 26 cm left ureteral stent  Intraoperative findings:   Approximately 3cm large staghorn stone succesfully fragmented and basket extracted. Unable to locate lower pole stone seen on pre-operative CT scan. No remaining fragments seen in kidney or ureter with flexible cystoscope and ureteroscope. No significant bleeding.   Indication:  Kathy Vargas is a 71 y.o. female with a history of urolithiasis. Their stone burden included a right renal stone that has already been treated via ureteroscopy with Dr. Lafonda Mosses. She has a 2.8cm left renal staghorn stone and a 58mm left lower pole stone with adjacent renal scarring with minimal parenchyma around stone. Of note, her CT scan did demonstrate a part retrorenal colon in the inferior aspect. After a thorough discussion of the risks, benefits, and alternatives of the surgery, pt agreed to proceed.  In the morning of surgery, pt went down to Interventional Radiology to get an access placed and nephroscopy films were reviewed prior to surgery today. They had good access with a 4-French nephroureteral catheter going all the way down to the bladder.  Description of procedure: After informed  consent was obtained from the patient, the patient was identified, the patient was brought to the operating room and placed in supine position.   General anesthesia was administered as well as perioperative IV antibiotics with 2g of ampicillin and 5mg /kg of gentamicin.  At the beginning of the case, a time-out was performed to properly identify the patient, the surgery to be performed, and the surgical site. Sequential compression devices were applied to lower extremities at the beginning of the case for DVT prophylaxis.   The patient was then placed into a prone position onto gel rolls, making sure that all pressure points were properly padded.  The patient's left flank and genitalia were then prepped and draped in sterile fashion.    We placed a Foley sterilely in the patient in prone position.  A sensor wire was passed through the 4-French open-ended catheter down to the bladder, which we could see under fluoroscopy. We made an incision around this wire. We then dilated the tract using a dual lumen. An antegrade nephrostogram was performed which demonstrated a large filling defect corresponding to the large staghorn stone. We then passed a 0.038 Sensor wire down to the bladder. I then exchanged a sensor wire for a superstiff wire with an open ended catheter.   The Bard X force balloon was then passed over the superstiff wire until the radiopaque tip was well into the lower pole calyx.  We dilated this balloon to 18 cm of water pressure.  We advanced the sheath over the balloon.  We deflated the balloon, leaving the wire in place.   The rigid nephroscope and the Trilogy were inserted into the kidney. The stone burden  was encountered. The Trilogy was used to fragement and suction out the stone. The Perc N-circle was used to remove large fragments. All fragments were removed. After treating the stone, I noticed a slight tear in the renal pelvis around the narrowed lower pole infundibulum.  Flexible  nephroscopy was performed revealing no further fragments. Repeat retrograde pyeloscopy was performed using the ureteroscope. No further stones were noted. There was a radiopaque area near the left upper pole however this was likely retained contrast of a small calyx. I did not identify any further stones in the kidney. I was unable to identify the lower pole stone seen on preoperative imagine.  We advanced the ureteroscope all the way down into the bladder. There were no stones identified along the course of the ureter. We then passed a 0.038 zip wire through the ureteroscope into the bladder.  A 13fr x 26cm stent was then placed in the standard fashion using fluoroscopic guidance. The proximal curl was within the upper pole and distal curl in the bladder.  We then advanced a 20-Fr council tip catheter over the superstiff wire into the upper pole. The sheath was removed. There was no significant bleeding. A repeat antegrade nephrostogram was performed demonstrating no filling defects and no extravasation of contrast. The balloon was filled with 3cc of sterile water.   We then sutured the Councill tip catheter into position using a couple of 0 silk sutures.  The incision was closed with a 2-0 vicryl suture. Sterile 4x4's and ABD pad were placed around the nephrostomy tube.  Several large OpSite dressings were placed over this.    At this point, the procedure was completed. She was then transferred to the supine position and recovery room in stable condition. The patient tolerated the procedure well.  There were no immediate complications.  Plan:  Patient will be observed overnight. CT A/P non-con will be obtained to assess remaining stone burden. We will plan to clamp the nephrostomy tube tomorrow and remove prior to discharge tomorrow if no increased pain or fevers.  Matt R. Chesnie Capell MD [facility]  Pager: [phone]

## 2022-07-01 NOTE — Anesthesia Procedure Notes (Signed)
Procedure Name: Intubation Date/Time: 07/01/2022 11:12 AM  Performed by: Lind Covert, CRNAPre-anesthesia Checklist: Patient identified, Emergency Drugs available, Suction available, Patient being monitored and Timeout performed Patient Re-evaluated:Patient Re-evaluated prior to induction Oxygen Delivery Method: Circle system utilized Preoxygenation: Pre-oxygenation with 100% oxygen Induction Type: IV induction Ventilation: Mask ventilation without difficulty Laryngoscope Size: Mac and 3 Grade View: Grade I Tube type: Oral Tube size: 7.0 mm Number of attempts: 1 Airway Equipment and Method: Stylet Placement Confirmation: ETT inserted through vocal cords under direct vision, positive ETCO2 and breath sounds checked- equal and bilateral Secured at: 22 cm Tube secured with: Tape Dental Injury: Teeth and Oropharynx as per pre-operative assessment

## 2022-07-01 NOTE — Discharge Instructions (Addendum)
[facility] [phone] Post Ureteroscopy With or Without Stent Instructions  Definitions:  Ureter: The duct that transports urine from the kidney to the bladder. Stent:   A plastic hollow tube that is placed into the ureter, from the kidney to the bladder to prevent the ureter from swelling shut.  GENERAL INSTRUCTIONS:  Despite the fact that no skin incisions were used, the area around the ureter and bladder is raw and irritated. The stent is a foreign body which will further irritate the bladder wall. This irritation is manifested by increased frequency of urination, both day and night, and by an increase in the urge to urinate. In some, the urge to urinate is present almost always. Sometimes the urge is strong enough that you may not be able to stop yourself from urinating. The only real cure is to remove the stent and then give time for the bladder wall to heal which can't be done until the danger of the ureter swelling shut has passed, which varies.  You may see some blood in your urine while the stent is in place and a few days afterwards. Do not be alarmed, even if the urine was clear for a while. Get off your feet and drink lots of fluids until clearing occurs. If you start to pass clots or don't improve, call us.  DIET: You may return to your normal diet immediately. Because of the raw surface of your bladder, alcohol, spicy foods, acid type foods and drinks with caffeine may cause irritation or frequency and should be used in moderation. To keep your urine flowing freely and to avoid constipation, drink plenty of fluids during the day ( 8-10 glasses ). Tip: Avoid cranberry juice because it is very acidic.  ACTIVITY: Your physical activity doesn't need to be restricted. However, if you are very active, you may see some blood in your urine. We suggest that you reduce your activity under these circumstances until the bleeding has stopped.  BOWELS: It is important to  keep your bowels regular during the postoperative period. Straining with bowel movements can cause bleeding. A bowel movement every other day is reasonable. Use a mild laxative if needed, such as Milk of Magnesia 2-3 tablespoons, or 2 Dulcolax tablets. Call if you continue to have problems. If you have been taking narcotics for pain, before, during or after your surgery, you may be constipated. Take a laxative if necessary.   MEDICATION: You should resume your pre-surgery medications unless told not to. In addition you will often be given an antibiotic to prevent infection. These should be taken as prescribed until the bottles are finished unless you are having an unusual reaction to one of the drugs.  Your potassium was slightly high Saturday after your kidney surgery, so the pharmacist recommended you not take your Avapro (irbesartan) on Sunday and Monday and restart on Tuesday.  PROBLEMS YOU SHOULD REPORT TO [place]: Fevers over 100.5 Fahrenheit. Heavy bleeding, or clots ( See above notes about blood in urine ). Inability to urinate. Drug reactions ( hives, rash, nausea, vomiting, diarrhea ). Severe burning or pain with urination that is not improving.  FOLLOW-UP: You will need a follow-up appointment to monitor your progress. Call for this appointment at the number listed above. Usually the first appointment will be about three to fourteen days after your surgery.  You have a ureteral stent in place. F/u in the office in 2 weeks as scheduled for stent removal.

## 2022-07-01 NOTE — Procedures (Signed)
Interventional Radiology Procedure Note  Procedure: Image guided access left kidney for pending nephrolithotomy.    Findings:  Access into lower pole, posterior calyx, with 42F kumpe catheter into the bladder.  Catheter will accept any 035 wire.  Complications: None  EBL: None    Recommendations: - NPO to the OR today   - routine wound care  Signed,  Yvone Neu. Loreta Ave, DO

## 2022-07-01 NOTE — Transfer of Care (Signed)
Immediate Anesthesia Transfer of Care Note  Patient: Denisa Enterline  Procedure(s) Performed: NEPHROLITHOTOMY PERCUTANEOUS/ INTERVENTIONAL RADIOLOGY TO PLACE LEFT NEPHROSTOMY TUBE PRIOR (Left) CYSTOSCOPY/ RETROGRADE/URETEROSCOPY/STENT PLACEMENT (Left)  Patient Location: PACU  Anesthesia Type:General  Level of Consciousness: sedated  Airway & Oxygen Therapy: Patient Spontanous Breathing and Patient connected to face mask oxygen  Post-op Assessment: Report given to RN and Post -op Vital signs reviewed and stable  Post vital signs: Reviewed and stable  Last Vitals:  Vitals Value Taken Time  BP 159/112 07/01/22 1313  Temp    Pulse 79 07/01/22 1315  Resp 14 07/01/22 1315  SpO2 100 % 07/01/22 1315  Vitals shown include unvalidated device data.  Last Pain:  Vitals:   07/01/22 1045  TempSrc: Oral      Patients Stated Pain Goal: 4 (07/01/22 0733)  Complications: No notable events documented.

## 2022-07-01 NOTE — H&P (Signed)
CC/HPI: Kathy Vargas is a 71 year old female seen in consultation today for urolithiasis.   1. Urolithiasis:  -[id number]: No prior stones.  -S/p R URS/LL 04/12/2022 by Dr. Lafonda Mosses for a right UPJ stone measuring 1.3 cm  -CT A/P 03/28/2022 with right UPJ stone measuring 1.3 cm and left upper pole stone measuring 2.8 cm that is staghorn in the left renal pelvis. She does complain of intermittent left-sided flank pain. She denies further right flank pain. She denies fevers, chills, dysuria.   She has a past medical history of COPD, depression, hypothyroidism. She has had a hysterectomy and cholecystectomy. She denies taking anticoagulation.     ALLERGIES: Morphine Hcl    MEDICATIONS: Adderall  Irbesartan 150 mg tablet  Xanax 0.5 mg tablet     GU PSH: Hysterectomy Ureteroscopic laser litho - 04/12/2022     NON-GU PSH: Appendectomy (laparoscopic) Back surgery     GU PMH: Abdominal Pain Unspec - 03/28/2022 Renal and ureteral calculus - 03/28/2022 Ureteral obstruction secondary to calculous - 03/28/2022    NON-GU PMH: Arthritis Hepatitis C Hypertension    FAMILY HISTORY: 2 daughters - Runs in Family 1 son - Runs in Family   SOCIAL HISTORY: Marital Status: Single Preferred Language: English; Ethnicity: Not Hispanic Or Latino; Race: White Current Smoking Status: Patient smokes occasionally.   Tobacco Use Assessment Completed: Used Tobacco in last 30 days? Has never drank.  Drinks 2 caffeinated drinks per day.    REVIEW OF SYSTEMS:    GU Review Female:   Patient denies stream starts and stops, frequent urination, have to strain to urinate, being pregnant, trouble starting your stream, hard to postpone urination, leakage of urine, burning /pain with urination, and get up at night to urinate.  Gastrointestinal (Upper):   Patient denies nausea, vomiting, and indigestion/ heartburn.  Gastrointestinal (Lower):   Patient denies diarrhea and constipation.  Constitutional:   Patient  denies fever, night sweats, weight loss, and fatigue.  Skin:   Patient denies skin rash/ lesion and itching.  Eyes:   Patient denies blurred vision and double vision.  Ears/ Nose/ Throat:   Patient denies sore throat and sinus problems.  Hematologic/Lymphatic:   Patient denies swollen glands and easy bruising.  Cardiovascular:   Patient denies leg swelling and chest pains.  Respiratory:   Patient denies cough and shortness of breath.  Endocrine:   Patient denies excessive thirst.  Musculoskeletal:   Patient denies back pain and joint pain.  Neurological:   Patient denies headaches and dizziness.  Psychologic:   Patient denies depression and anxiety.   VITAL SIGNS: None   MULTI-SYSTEM PHYSICAL EXAMINATION:    Constitutional: Well-nourished. No physical deformities. Normally developed. Good grooming.  Respiratory: No labored breathing, no use of accessory muscles.   Cardiovascular: Normal temperature, normal extremity pulses, no swelling, no varicosities.  Gastrointestinal: No mass, no tenderness, no rigidity, non obese abdomen.     Complexity of Data:  Source Of History:  Patient, Medical Record Summary  Records Review:   Previous Doctor Records, Previous Hospital Records  Urine Test Review:   Urinalysis  X-Ray Review: C.T. Abdomen/Pelvis: Reviewed Films. Reviewed Report. Discussed With Patient.     PROCEDURES:          Urinalysis w/Scope Dipstick Dipstick Cont'd Micro  Color: Amber Bilirubin: Neg mg/dL WBC/hpf: >27/CWC  Appearance: Cloudy Ketones: Neg mg/dL RBC/hpf: 20 - 37/SEG  Specific Gravity: 1.025 Blood: 3+ ery/uL Bacteria: Mod (26-50/hpf)  pH: 6.0 Protein: 3+ mg/dL Cystals: NS (Not Seen)  Glucose: Neg  mg/dL Urobilinogen: 0.2 mg/dL Casts: NS (Not Seen)    Nitrites: Positive Trichomonas: Not Present    Leukocyte Esterase: 3+ leu/uL Mucous: Not Present      Epithelial Cells: 0 - 5/hpf      Yeast: NS (Not Seen)      Sperm: Not Present    ASSESSMENT:      ICD-10 Details   1 GU:   Renal and ureteral calculus - N20.2    PLAN:           Orders Labs CULTURE, URINE          Document Letter(s):  Created for Patient: Clinical Summary         Notes:   1. Left partial staghorn stone:  -I discussed options to treat her large left partial staghorn stone measuring up to 3 cm. I recommended left PCNL as the best option for stone clearance. I did discuss risk of hemorrhage requiring blood transfusion, embolization, infection and incomplete stone clearance. She like to proceed. Surgery letter sent.  -She is asymptomatic without dysuria. Urinalysis with positive nitrites. Will obtain urine culture and treat. She will require negative urine culture prior to PCNL.  -She will also require preoperative appointment within 1 month of surgery.  -We will obtain medical clearance   We discussed the options for management of kidney stones, including observation, ESWL, ureteroscopy with laser lithotripsy, and PCNL. The risks and benefits of each option were discussed.  For observation I described the risks which include but are not limited to silent renal damage, life-threatening infection, need for emergent surgery, failure to pass stone, and pain.   ESWL: risks and benefits of ESWL were outlined including infection, bleeding, pain, steinstrasse, kidney injury, need for ancillary treatments, and global anesthesia risks including but not limited to CVA, MI, DVT, PE, pneumonia, and death.   Ureteroscopy: risks and benefits of ureteroscopy were outlined, including infection, bleeding, pain, temporary ureteral stent and associated stent bother, ureteral injury, ureteral stricture, need for ancillary treatments, and global anesthesia risks including but not limited to CVA, MI, DVT, PE, pneumonia, and death.   PCNL: risks and benefits of PCNL were outlined including infection, bleeding, blood transfusion, pain, pneumothorax, bowel injury, persistent urine leak, positioning injury,  inability to clear stone burden, renal laceration, arterial venous fistula or malformation, need for ancillary treatments, and global anesthesia risks including but not limited to CVA, MI, DVT, PE, pneumonia, and death.   Urology Preoperative H&P   Chief Complaint: Urolithiasis, left staghorn stone  History of Present Illness: Kathy Vargas is a 71 y.o. female with a history of bilateral renal stones.   She underwent right ureteroscopy with laser lithotripsy on 04/12/2022 with Dr. Lafonda Mosses for right UPJ stone measuring 1.3 cm.  She will follow-up with him in the office in July with evidence of small calcification within the right kidney and decreased hydronephrosis.  Of note, CT A/P 03/28/2022 demonstrated a left renal stone measuring 2.8 cm and a staghorn extending to the renal pelvis.  She is being brought to the operating today for a left percutaneous nephrolithotomy.  She has access arranged with interventional radiology prior to the case.  She denies fevers or chills.  She denies dysuria.  Urine culture 05/03/2022 with insignificant growth. She has had no recent sign of urinary tract infection. She was medically cleared prior to the procedure today.    Past Medical History:  Diagnosis Date   ABDOMINAL TENDERNESS 01/21/2011   ACUTE GINGIVITIS NONPLAQUE INDUCED 06/11/2010  ADD 09/05/2008   Alcohol abuse, in remission 09/27/2007   ALLERGIC RHINITIS 03/02/2009   Anemia    ANXIETY 07/02/2007   BACK PAIN 05/19/2009   CHRONIC OBSTRUCTIVE PULMONARY DISEASE, ACUTE EXACERBATION 08/06/2009   COPD 07/02/2007   DEGENERATIVE JOINT DISEASE, CERVICAL SPINE 07/02/2007   DEPRESSION 07/02/2007   Dyspnea    GASTROENTERITIS, VIRAL 11/29/2010   GERD 01/21/2011   Headache(784.0) 07/02/2007   History of kidney stones    Hypertension    HYPOTHYROIDISM 07/02/2007   Lumbar disc disease    Lumbar pain with radiation down right leg 06/01/2011   OSTEOARTHRITIS 07/02/2007   Other and unspecified ovarian cyst  05/19/2009   PAIN, CHRONIC, DUE TO TRAUMA 07/02/2007   PELVIC  PAIN 03/29/2010   UTI 05/19/2009   Vitamin D insufficiency     Past Surgical History:  Procedure Laterality Date   ABDOMINAL HYSTERECTOMY     CATARACT EXTRACTION W/ INTRAOCULAR LENS IMPLANT Bilateral    CHOLECYSTECTOMY     eyebrow lift     LIPOSUCTION     LUMBAR DISC SURGERY     L4, L5   URETEROSCOPY WITH HOLMIUM LASER LITHOTRIPSY Right 04/12/2022   Procedure: URETEROSCOPY WITH HOLMIUM LASER LITHOTRIPSY, STENT PLACEMENT;  Surgeon: Despina Arias, MD;  Location: [facility];  Service: Urology;  Laterality: Right;    Allergies:  Allergies  Allergen Reactions   Morphine Nausea And Vomiting   Ceftin [Cefuroxime]     Nausea and vomiting    Family History  Problem Relation Age of Onset   Alcohol abuse Other    Anxiety disorder Other    Coronary artery disease Other    Depression Other    Stroke Other     Social History:  reports that she has been smoking cigarettes. She started smoking about 59 years ago. She has a 28.00 pack-year smoking history. She has never used smokeless tobacco. She reports that she does not currently use alcohol. She reports that she does not use drugs.  ROS: A complete review of systems was performed.  All systems are negative except for pertinent findings as noted.  Physical Exam:  Vital signs in last 24 hours: Temp:  [97.9 F (36.6 C)-98.1 F (36.7 C)] 98.1 F (36.7 C) (08/11 0719) Pulse Rate:  [81-82] 81 (08/11 0719) BP: (152-158)/(78-84) 156/78 (08/11 0719) SpO2:  [98 %-100 %] 100 % (08/11 0719) Weight:  [55.3 kg-55.5 kg] 55.5 kg (08/11 0733) Constitutional:  Alert and oriented, No acute distress Cardiovascular: Regular rate and rhythm Respiratory: Normal respiratory effort, Lungs clear bilaterally GI: Abdomen is soft, nontender, nondistended, no abdominal masses GU: No CVA tenderness Lymphatic: No lymphadenopathy Neurologic: Grossly intact, no focal deficits Psychiatric:  Normal mood and affect  Laboratory Data:  Recent Labs    07/01/22 0758  WBC 5.6  HGB 10.5*  HCT 33.7*  PLT 267    Recent Labs    07/01/22 0758  NA 138  K 4.6  CL 111  GLUCOSE 95  BUN 37*  CALCIUM 8.7*  CREATININE 2.15*     Results for orders placed or performed during the hospital encounter of 07/01/22 (from the past 24 hour(s))  CBC with Differential/Platelet     Status: Abnormal   Collection Time: 07/01/22  7:58 AM  Result Value Ref Range   WBC 5.6 4.0 - 10.5 K/uL   RBC 3.69 (L) 3.87 - 5.11 MIL/uL   Hemoglobin 10.5 (L) 12.0 - 15.0 g/dL   HCT 63.8 (L) 93.7 - 34.2 %   MCV  91.3 80.0 - 100.0 fL   MCH 28.5 26.0 - 34.0 pg   MCHC 31.2 30.0 - 36.0 g/dL   RDW 41.3 24.4 - 01.0 %   Platelets 267 150 - 400 K/uL   nRBC 0.0 0.0 - 0.2 %   Neutrophils Relative % 68 %   Neutro Abs 3.9 1.7 - 7.7 K/uL   Lymphocytes Relative 20 %   Lymphs Abs 1.1 0.7 - 4.0 K/uL   Monocytes Relative 6 %   Monocytes Absolute 0.4 0.1 - 1.0 K/uL   Eosinophils Relative 4 %   Eosinophils Absolute 0.2 0.0 - 0.5 K/uL   Basophils Relative 1 %   Basophils Absolute 0.0 0.0 - 0.1 K/uL   Immature Granulocytes 1 %   Abs Immature Granulocytes 0.03 0.00 - 0.07 K/uL  Protime-INR     Status: None   Collection Time: July 12, 2022  7:58 AM  Result Value Ref Range   Prothrombin Time 12.2 11.4 - 15.2 seconds   INR 0.9 0.8 - 1.2  Basic metabolic panel     Status: Abnormal   Collection Time: Jul 12, 2022  7:58 AM  Result Value Ref Range   Sodium 138 135 - 145 mmol/L   Potassium 4.6 3.5 - 5.1 mmol/L   Chloride 111 98 - 111 mmol/L   CO2 19 (L) 22 - 32 mmol/L   Glucose, Bld 95 70 - 99 mg/dL   BUN 37 (H) 8 - 23 mg/dL   Creatinine, Ser 2.72 (H) 0.44 - 1.00 mg/dL   Calcium 8.7 (L) 8.9 - 10.3 mg/dL   GFR, Estimated 24 (L) >60 mL/min   Anion gap 8 5 - 15   No results found for this or any previous visit (from the past 240 hour(s)).  Renal Function: Recent Labs    Jul 12, 2022 0758  CREATININE 2.15*   Estimated  Creatinine Clearance: 21 mL/min (A) (by C-G formula based on SCr of 2.15 mg/dL (H)).  Radiologic Imaging: No results found.  I independently reviewed the above imaging studies.  Assessment and Plan Kathy Vargas is a 71 y.o. female with a left staghorn stone here today for LEFT PCNL.   Risks and benefits of PCNL were outlined including infection, bleeding, blood transfusion, pain, pneumothorax, bowel injury, persistent urine leak, positioning injury, inability to clear stone burden, renal laceration, arterial venous fistula or malformation, need for ancillary treatments, and global anesthesia risks including but not limited to CVA, MI, DVT, PE, pneumonia, and death.    Matt R. Rosine Solecki MD 07-12-2022, 9:54 AM  [facility] Pager: [phone]): [phone]

## 2022-07-02 ENCOUNTER — Other Ambulatory Visit (HOSPITAL_COMMUNITY): Payer: Self-pay

## 2022-07-02 ENCOUNTER — Observation Stay (HOSPITAL_COMMUNITY): Payer: Medicare Other

## 2022-07-02 ENCOUNTER — Encounter: Payer: Self-pay | Admitting: Internal Medicine

## 2022-07-02 DIAGNOSIS — E039 Hypothyroidism, unspecified: Secondary | ICD-10-CM | POA: Diagnosis not present

## 2022-07-02 DIAGNOSIS — Z79899 Other long term (current) drug therapy: Secondary | ICD-10-CM | POA: Diagnosis not present

## 2022-07-02 DIAGNOSIS — N200 Calculus of kidney: Secondary | ICD-10-CM | POA: Diagnosis not present

## 2022-07-02 DIAGNOSIS — J449 Chronic obstructive pulmonary disease, unspecified: Secondary | ICD-10-CM | POA: Diagnosis not present

## 2022-07-02 DIAGNOSIS — I10 Essential (primary) hypertension: Secondary | ICD-10-CM | POA: Diagnosis not present

## 2022-07-02 DIAGNOSIS — N202 Calculus of kidney with calculus of ureter: Secondary | ICD-10-CM | POA: Diagnosis not present

## 2022-07-02 DIAGNOSIS — F17210 Nicotine dependence, cigarettes, uncomplicated: Secondary | ICD-10-CM | POA: Diagnosis not present

## 2022-07-02 LAB — BASIC METABOLIC PANEL
Anion gap: 7 (ref 5–15)
BUN: 37 mg/dL — ABNORMAL HIGH (ref 8–23)
CO2: 20 mmol/L — ABNORMAL LOW (ref 22–32)
Calcium: 8.8 mg/dL — ABNORMAL LOW (ref 8.9–10.3)
Chloride: 110 mmol/L (ref 98–111)
Creatinine, Ser: 2.26 mg/dL — ABNORMAL HIGH (ref 0.44–1.00)
GFR, Estimated: 23 mL/min — ABNORMAL LOW (ref >60)
Glucose, Bld: 120 mg/dL — ABNORMAL HIGH (ref 70–99)
Potassium: 5.7 mmol/L — ABNORMAL HIGH (ref 3.5–5.1)
Sodium: 137 mmol/L (ref 135–145)

## 2022-07-02 LAB — CBC
HCT: 36.7 % (ref 36.0–46.0)
Hemoglobin: 11.1 g/dL — ABNORMAL LOW (ref 12.0–15.0)
MCH: 28 pg (ref 26.0–34.0)
MCHC: 30.2 g/dL (ref 30.0–36.0)
MCV: 92.4 fL (ref 80.0–100.0)
Platelets: 314 10*3/uL (ref 150–400)
RBC: 3.97 MIL/uL (ref 3.87–5.11)
RDW: 13.2 % (ref 11.5–15.5)
WBC: 14.6 10*3/uL — ABNORMAL HIGH (ref 4.0–10.5)
nRBC: 0 % (ref 0.0–0.2)

## 2022-07-02 MED ORDER — IRBESARTAN 300 MG PO TABS: 300.0000 mg | ORAL_TABLET | Freq: Every day | ORAL | 3 refills | Status: DC

## 2022-07-02 MED ORDER — GENTAMICIN SULFATE 40 MG/ML IJ SOLN
2.5000 mg/kg | Freq: Once | INTRAMUSCULAR | Status: AC
  Administered 2022-07-02: 140 mg via INTRAVENOUS
  Filled 2022-07-02: qty 3.5

## 2022-07-02 NOTE — Progress Notes (Signed)
Patient discharged home.  Discharge instructions explained, patient verbalizes understanding 

## 2022-07-02 NOTE — Anesthesia Postprocedure Evaluation (Signed)
Anesthesia Post Note  Patient: Kathy Vargas  Procedure(s) Performed: NEPHROLITHOTOMY PERCUTANEOUS/ INTERVENTIONAL RADIOLOGY TO PLACE LEFT NEPHROSTOMY TUBE PRIOR (Left) CYSTOSCOPY/ RETROGRADE/URETEROSCOPY/STENT PLACEMENT (Left)     Patient location during evaluation: PACU Anesthesia Type: General Level of consciousness: awake and alert, oriented and patient cooperative Pain management: pain level controlled Vital Signs Assessment: post-procedure vital signs reviewed and stable Respiratory status: spontaneous breathing, nonlabored ventilation and respiratory function stable Cardiovascular status: blood pressure returned to baseline and stable Postop Assessment: no apparent nausea or vomiting Anesthetic complications: no   No notable events documented.  Last Vitals:  Vitals:   07/01/22 2337 07/02/22 0454  BP: 132/74 (!) 120/59  Pulse: 78 69  Resp: 16 18  Temp: 36.5 C 36.8 C  SpO2: 99% 99%    Last Pain:  Vitals:   07/02/22 0853  TempSrc:   PainSc: 4                  Lannie Fields

## 2022-07-02 NOTE — Discharge Summary (Addendum)
Physician Discharge Summary  Patient ID: Kathy Vargas MRN: [id number] DOB/AGE: [birth year] 71 y.o.  Admit date: 07/01/2022 Discharge date: 07/02/2022  Admission Diagnoses:  Discharge Diagnoses:  Principal Problem:   Left renal stone   Discharged Condition: good  Hospital Course: Mende was admitted following a left PCNL.  She has done well.  She is tolerating a regular diet.  CT scan of the abdomen and pelvis was obtained which showed some residual left lower pole stone but nothing worrisome.  Dr. Cardell Peach reviewed the CT.  She was given a dose of gentamicin today.  The nephrostomy tube was capped and she tolerated capping trial.  Foley catheter was removed and she voided on her own without difficulty before the left nephrostomy tube was removed.  Then the left nephrostomy tube was removed and a dressing placed.  Removal without difficulty.  She did have slightly high potassium today and pharmacist recommended she hold her Avapro tomorrow and restart it Tuesday.    Consults: None.  Significant Diagnostic Studies: CT A/P  Treatments: surgery: Left percutaneous nephrolithotomy  Discharge Exam: Blood pressure (!) 120/59, pulse 69, temperature 98.3 F (36.8 C), temperature source Oral, resp. rate 18, height 5\' 5"  (1.651 m), weight 55.5 kg, SpO2 99 %. She is well.,  Ambulating around the room, alert and oriented Cardiovascular-regular rate and rhythm Chest-clear to auscultation bilaterally Abdomen -soft and nontender; left nephrostomy tube removed intact site was clean.  Dressing placed. Extremity -no calf pain or swelling.  Disposition: Discharge disposition: 01-Home or Self Care      Medication Instructions START taking these medications START taking these medications    Morning Noon Evening Bedtime As Needed  ciprofloxacin 500 MG tablet  Commonly known as: Cipro  Take 1 tablet by mouth 2 times daily for 6 doses. Take day prior, day of and day after ureteral stent removal in  the office        docusate sodium 100 MG capsule  Commonly known as: Colace  Take 1 capsule (100 mg total) by mouth daily as needed for up to 30 doses.        oxyCODONE-acetaminophen 5-325 MG tablet  Commonly known as: Percocet  Take 1 tablet by mouth every 4 hours as needed for up to 18 doses for severe pain.               CONTINUE taking these medications CONTINUE taking these medications    Morning Noon Evening Bedtime As Needed  Advair Diskus 250-50 MCG/ACT Aepb  Generic drug: fluticasone-salmeterol  Inhale 1 puff into the lungs 2 (two) times daily as needed (Asthma).        albuterol 108 (90 Base) MCG/ACT inhaler  Commonly known as: VENTOLIN HFA  Inhale 2 puffs into the lungs every 6 (six) hours as needed for wheezing or shortness of breath.        ALPRAZolam 0.5 MG tablet  Commonly known as: XANAX  Take 1 tablet (0.5 mg total) by mouth 2 (two) times daily as needed.        amphetamine-dextroamphetamine 30 MG tablet  Commonly known as: ADDERALL  Take 1 tablet by mouth 2 times daily.        CAL-MAG-ZINC PO  Take 1 tablet by mouth at bedtime.        cetirizine 10 MG tablet  Commonly known as: ZYRTEC  Take 10 mg by mouth daily as needed for allergies.        fluticasone 0.005 % ointment  Commonly known as: CUTIVATE  Apply to the skin 2 times daily if needed  Doctor's comments: Suggested Packaging: 30.0 Grams tube.        HAIR/SKIN/NAILS PO  Take 1 tablet by mouth daily.        hydrALAZINE 50 MG tablet  Commonly known as: APRESOLINE  Take 1 tablet (50 mg total) by mouth 3 (three) times daily.        iron polysaccharides 150 MG capsule  Commonly known as: Nu-Iron  Take 1 capsule (150 mg total) by mouth daily.        nystatin cream  Commonly known as: MYCOSTATIN  Apply to lips once a day  Doctor's comments: Suggested Packaging: 30.0 Grams tube.        pantoprazole 40 MG tablet  Commonly known as: PROTONIX  Take 1 tablet (40 mg total) by mouth daily.        VITAMIN C PO   Take 1 tablet by mouth in the morning.        VITAMIN D3 PO  Take 1 tablet by mouth in the morning.               CHANGE how you take these medications CHANGE how you take these medications    Morning Noon Evening Bedtime As Needed  diclofenac 75 MG EC tablet  Commonly known as: VOLTAREN  Take 1 tablet (75 mg total) by mouth 2 (two) times daily.  What changed:  when to take this  reasons to take this       irbesartan 300 MG tablet  Commonly known as: Avapro  Take 1 tablet by mouth daily.  What changed: These instructions start on July 05, 2022. If you are unsure what to do until then, ask your doctor or other care provider.  Start taking on: July 05, 2022                    Follow-up Information     [facility] Follow up on 07/18/2022.   Why: 10:45AM Contact information: [address] [place] [address] [phone]                Signed: Jerilee Field 07/02/2022, 1:21 PM

## 2022-07-02 NOTE — Progress Notes (Signed)
   S: PONV resolved and she tolerated a regular diet for breakfast. The "bloating" she had before PCNL has also resolved and she's happy about that. Passing flatus.   O: . Vitals:   07/01/22 2337 07/02/22 0454  BP: 132/74 (!) 120/59  Pulse: 78 69  Resp: 16 18  Temp: 97.7 F (36.5 C) 98.3 F (36.8 C)  SpO2: 99% 99%    Intake/Output Summary (Last 24 hours) at 07/02/2022 1113 Last data filed at 07/02/2022 1056 Gross per 24 hour  Intake 2849.59 ml  Output 1600 ml  Net 1249.59 ml   NAD Watching tv CV - rrr Resp - reg effort and depth Abd- soft, NT Ext - no calf pain or swelling GU-Left Nx urine clear, foley/bladder urine light red   CBC    Component Value Date/Time   WBC 14.6 (H) 07/02/2022 0338   RBC 3.97 07/02/2022 0338   HGB 11.1 (L) 07/02/2022 0338   HCT 36.7 07/02/2022 0338   PLT 314 07/02/2022 0338   MCV 92.4 07/02/2022 0338   MCH 28.0 07/02/2022 0338   MCHC 30.2 07/02/2022 0338   RDW 13.2 07/02/2022 0338   LYMPHSABS 1.1 07/01/2022 0758   MONOABS 0.4 07/01/2022 0758   EOSABS 0.2 07/01/2022 0758   BASOSABS 0.0 07/01/2022 0758   Lab Results  Component Value Date   CREATININE 2.26 (H) 07/02/2022   CREATININE 2.10 (H) 07/01/2022   CREATININE 2.15 (H) 07/01/2022    A/p: 1) d/c IVF with K 2) left Nx capped 3) d/c foley   Possible discharge today

## 2022-07-02 NOTE — Assessment & Plan Note (Signed)
For f/u procedure tonight

## 2022-07-02 NOTE — Assessment & Plan Note (Signed)
BP Readings from Last 3 Encounters:  07/02/22 (!) 120/59  07/01/22 (!) 166/92  06/30/22 (!) 152/84   Stable, pt to continue medical treatment avapro 300 mg qd, and add hdyralzine 50 tid start tonight

## 2022-07-02 NOTE — Assessment & Plan Note (Signed)
Counseled to quit, pt not ready 

## 2022-07-02 NOTE — Assessment & Plan Note (Signed)
Lab Results  Component Value Date   HGBA1C 5.9 05/29/2019   Stable, pt to continue current medical treatment  - diet, wt control, excercise

## 2022-07-02 NOTE — Progress Notes (Signed)
Pt ambulated in room before bedtime and on hallway this morning. Tolerated well

## 2022-07-02 NOTE — Assessment & Plan Note (Signed)
Mild to mod situationally worse - for xanax 0.5 but prn refill and restart tonight

## 2022-07-03 ENCOUNTER — Encounter (HOSPITAL_COMMUNITY): Payer: Self-pay | Admitting: Urology

## 2022-07-04 ENCOUNTER — Other Ambulatory Visit (HOSPITAL_COMMUNITY): Payer: Self-pay

## 2022-07-08 ENCOUNTER — Ambulatory Visit: Payer: Medicare Other | Admitting: Internal Medicine

## 2022-07-18 DIAGNOSIS — N202 Calculus of kidney with calculus of ureter: Secondary | ICD-10-CM | POA: Diagnosis not present

## 2022-07-27 ENCOUNTER — Other Ambulatory Visit: Payer: Self-pay | Admitting: Internal Medicine

## 2022-07-27 ENCOUNTER — Other Ambulatory Visit (HOSPITAL_COMMUNITY): Payer: Self-pay

## 2022-07-27 MED ORDER — AMPHETAMINE-DEXTROAMPHETAMINE 30 MG PO TABS
30.0000 mg | ORAL_TABLET | Freq: Two times a day (BID) | ORAL | 0 refills | Status: DC
Start: 2022-07-27 — End: 2022-09-05
  Filled 2022-07-27: qty 60, 30d supply, fill #0

## 2022-07-28 ENCOUNTER — Other Ambulatory Visit (HOSPITAL_COMMUNITY): Payer: Self-pay

## 2022-09-05 ENCOUNTER — Other Ambulatory Visit (HOSPITAL_COMMUNITY): Payer: Self-pay

## 2022-09-05 ENCOUNTER — Other Ambulatory Visit: Payer: Self-pay | Admitting: Internal Medicine

## 2022-09-05 MED ORDER — AMPHETAMINE-DEXTROAMPHETAMINE 30 MG PO TABS
30.0000 mg | ORAL_TABLET | Freq: Two times a day (BID) | ORAL | 0 refills | Status: DC
  Filled 2022-09-05: qty 60, 30d supply, fill #0

## 2022-09-07 ENCOUNTER — Other Ambulatory Visit: Payer: Self-pay | Admitting: Internal Medicine

## 2022-09-07 ENCOUNTER — Other Ambulatory Visit (HOSPITAL_COMMUNITY): Payer: Self-pay

## 2022-09-09 ENCOUNTER — Other Ambulatory Visit (HOSPITAL_COMMUNITY): Payer: Self-pay

## 2022-09-12 ENCOUNTER — Telehealth: Payer: Self-pay | Admitting: Internal Medicine

## 2022-09-12 NOTE — Telephone Encounter (Signed)
Left message for patient to call back to schedule Medicare Annual Wellness Visit   Last AWV  09/16/21  Please schedule at anytime with [facility] Advisor if patient calls the office back.     Any questions, please call me at [phone]  

## 2022-09-14 ENCOUNTER — Other Ambulatory Visit (HOSPITAL_COMMUNITY): Payer: Self-pay

## 2022-09-16 ENCOUNTER — Other Ambulatory Visit (HOSPITAL_COMMUNITY): Payer: Self-pay

## 2022-09-29 DIAGNOSIS — N200 Calculus of kidney: Secondary | ICD-10-CM | POA: Diagnosis not present

## 2022-10-05 VITALS — Ht 65.0 in | Wt 120.8 lb

## 2022-10-05 NOTE — Progress Notes (Signed)
This encounter was created in error - please disregard.

## 2022-10-05 NOTE — Patient Instructions (Addendum)
This encounter was created in error - please disregard.

## 2022-10-10 ENCOUNTER — Other Ambulatory Visit (HOSPITAL_COMMUNITY): Payer: Self-pay

## 2022-10-10 ENCOUNTER — Encounter: Payer: Self-pay | Admitting: Internal Medicine

## 2022-10-10 ENCOUNTER — Ambulatory Visit (INDEPENDENT_AMBULATORY_CARE_PROVIDER_SITE_OTHER): Payer: Medicare Other | Admitting: Internal Medicine

## 2022-10-10 VITALS — BP 160/92 | HR 84 | Ht 65.0 in | Wt 120.0 lb

## 2022-10-10 DIAGNOSIS — I10 Essential (primary) hypertension: Secondary | ICD-10-CM

## 2022-10-10 DIAGNOSIS — R739 Hyperglycemia, unspecified: Secondary | ICD-10-CM

## 2022-10-10 DIAGNOSIS — E785 Hyperlipidemia, unspecified: Secondary | ICD-10-CM

## 2022-10-10 MED ORDER — PANTOPRAZOLE SODIUM 40 MG PO TBEC
40.0000 mg | DELAYED_RELEASE_TABLET | Freq: Every day | ORAL | 3 refills | Status: DC
  Filled 2022-10-10: qty 90, 90d supply, fill #0

## 2022-10-10 MED ORDER — CETIRIZINE HCL 10 MG PO TABS
10.0000 mg | ORAL_TABLET | Freq: Every day | ORAL | 3 refills | Status: DC | PRN
  Filled 2022-10-10: qty 90, 90d supply, fill #0

## 2022-10-10 MED ORDER — AMPHETAMINE-DEXTROAMPHETAMINE 30 MG PO TABS
30.0000 mg | ORAL_TABLET | Freq: Two times a day (BID) | ORAL | 0 refills | Status: DC
  Filled 2022-10-10: qty 60, 30d supply, fill #0

## 2022-10-10 MED ORDER — DICLOFENAC SODIUM 75 MG PO TBEC
75.0000 mg | DELAYED_RELEASE_TABLET | Freq: Two times a day (BID) | ORAL | 5 refills | Status: DC | PRN
  Filled 2022-10-10: qty 60, 30d supply, fill #0

## 2022-10-10 NOTE — Patient Instructions (Addendum)
Ok to decrease the irbesartan back to 150 mg per day  Please continue all other medications as before, and refills have been done if requested - the zyrtec, protonix  Please have the pharmacy call with any other refills you may need.  Please continue your efforts at being more active, low cholesterol diet, and weight control.  Please keep your appointments with your specialists as you may have planned  Please make an Appointment to return in 3 months, or sooner if needed

## 2022-10-10 NOTE — Assessment & Plan Note (Signed)
BP Readings from Last 3 Encounters:  10/10/22 (!) 160/92  07/02/22 (!) 120/59  07/01/22 (!) 166/92   Now overcontrolled, cannot tolerate 300 mg irbesartan so for decrease to 150 mg qd

## 2022-10-10 NOTE — Assessment & Plan Note (Signed)
Lab Results  Component Value Date   HGBA1C 5.9 05/29/2019   Stable, pt to continue current medical treatment  - diet, wt control

## 2022-10-10 NOTE — Progress Notes (Signed)
Patient ID: Kathy Vargas, female   DOB: 16-Jun-1951, 71 y.o.   MRN: [id number]        Chief Complaint: follow up HTN, HLD and hyperglycemia       HPI:  Kathy Vargas is a 71 y.o. female here overall doing ok, but Pt denies chest pain, increased sob or doe, wheezing, orthopnea, PND, increased LE swelling, palpitations, or syncope, but having frequent dizziness most days now, had to stop the irbsartan 300 mg a few days ago.   Pt denies polydipsia, polyuria, or new focal neuro s/s.    Pt denies fever, wt loss, night sweats, loss of appetite, or other constitutional symptoms  Now s/p urology renal stone stent retriveal       Wt Readings from Last 3 Encounters:  10/10/22 120 lb (54.4 kg)  10/05/22 120 lb 12.8 oz (54.8 kg)  07/01/22 122 lb 7.1 oz (55.5 kg)   BP Readings from Last 3 Encounters:  10/10/22 (!) 160/92  07/02/22 (!) 120/59  07/01/22 (!) 166/92         Past Medical History:  Diagnosis Date   ABDOMINAL TENDERNESS 01/21/2011   ACUTE GINGIVITIS NONPLAQUE INDUCED 06/11/2010   ADD 09/05/2008   Alcohol abuse, in remission 09/27/2007   ALLERGIC RHINITIS 03/02/2009   Anemia    ANXIETY 07/02/2007   BACK PAIN 05/19/2009   CHRONIC OBSTRUCTIVE PULMONARY DISEASE, ACUTE EXACERBATION 08/06/2009   COPD 07/02/2007   DEGENERATIVE JOINT DISEASE, CERVICAL SPINE 07/02/2007   DEPRESSION 07/02/2007   Dyspnea    GASTROENTERITIS, VIRAL 11/29/2010   GERD 01/21/2011   Headache(784.0) 07/02/2007   History of kidney stones    Hypertension    HYPOTHYROIDISM 07/02/2007   Lumbar disc disease    Lumbar pain with radiation down right leg 06/01/2011   OSTEOARTHRITIS 07/02/2007   Other and unspecified ovarian cyst 05/19/2009   PAIN, CHRONIC, DUE TO TRAUMA 07/02/2007   PELVIC  PAIN 03/29/2010   UTI 05/19/2009   Vitamin D insufficiency    Past Surgical History:  Procedure Laterality Date   ABDOMINAL HYSTERECTOMY     CATARACT EXTRACTION W/ INTRAOCULAR LENS IMPLANT Bilateral    CHOLECYSTECTOMY      CYSTOSCOPY/URETEROSCOPY/HOLMIUM LASER/STENT PLACEMENT Left 07/01/2022   Procedure: CYSTOSCOPY/ RETROGRADE/URETEROSCOPY/STENT PLACEMENT;  Surgeon: Jannifer Hick, MD;  Location: [facility];  Service: Urology;  Laterality: Left;   eyebrow lift     IR URETERAL STENT LEFT NEW ACCESS W/O SEP NEPHROSTOMY CATH  07/01/2022   LIPOSUCTION     LUMBAR DISC SURGERY     L4, L5   NEPHROLITHOTOMY Left 07/01/2022   Procedure: NEPHROLITHOTOMY PERCUTANEOUS/ INTERVENTIONAL RADIOLOGY TO PLACE LEFT NEPHROSTOMY TUBE PRIOR;  Surgeon: Jannifer Hick, MD;  Location: [facility];  Service: Urology;  Laterality: Left;  ONLY NEEDS 120 MIN FOR ALL PROCEDURES   URETEROSCOPY WITH HOLMIUM LASER LITHOTRIPSY Right 04/12/2022   Procedure: URETEROSCOPY WITH HOLMIUM LASER LITHOTRIPSY, STENT PLACEMENT;  Surgeon: Despina Arias, MD;  Location: [facility];  Service: Urology;  Laterality: Right;    reports that she has been smoking cigarettes. She started smoking about 59 years ago. She has a 28.00 pack-year smoking history. She has never used smokeless tobacco. She reports that she does not currently use alcohol. She reports that she does not use drugs. family history includes Alcohol abuse in an other family member; Anxiety disorder in an other family member; Coronary artery disease in an other family member; Depression in an other family member; Stroke in an other family member. Allergies  Allergen Reactions  Morphine Nausea And Vomiting   Ceftin [Cefuroxime]     Nausea and vomiting   Current Outpatient Medications on File Prior to Visit  Medication Sig Dispense Refill   albuterol (VENTOLIN HFA) 108 (90 Base) MCG/ACT inhaler Inhale 2 puffs into the lungs every 6 (six) hours as needed for wheezing or shortness of breath. 18 g 11   ALPRAZolam (XANAX) 0.5 MG tablet Take 1 tablet (0.5 mg total) by mouth 2 (two) times daily as needed. 60 tablet 5   Ascorbic Acid (VITAMIN C PO) Take 1 tablet by mouth in the morning.     Biotin w/ Vitamins C & E  (HAIR/SKIN/NAILS PO) Take 1 tablet by mouth daily.     Calcium-Magnesium-Zinc (CAL-MAG-ZINC PO) Take 1 tablet by mouth at bedtime.     Cholecalciferol (VITAMIN D3 PO) Take 1 tablet by mouth in the morning.     fluticasone (CUTIVATE) 0.005 % ointment Apply to the skin 2 times daily if needed 30 g 1   fluticasone-salmeterol (ADVAIR DISKUS) 250-50 MCG/ACT AEPB Inhale 1 puff into the lungs 2 (two) times daily as needed (Asthma).     hydrALAZINE (APRESOLINE) 50 MG tablet Take 1 tablet (50 mg total) by mouth 3 (three) times daily. 90 tablet 1   iron polysaccharides (NU-IRON) 150 MG capsule Take 1 capsule (150 mg total) by mouth daily. 90 capsule 1   nystatin cream (MYCOSTATIN) Apply to lips once a day 30 g 1   docusate sodium (COLACE) 100 MG capsule Take 1 capsule (100 mg total) by mouth daily as needed for up to 30 doses. (Patient not taking: Reported on 10/10/2022) 30 capsule 0   irbesartan (AVAPRO) 300 MG tablet Take 1 tablet by mouth daily. (Patient not taking: Reported on 10/10/2022) 90 tablet 3   [DISCONTINUED] FLUoxetine (PROZAC) 20 MG capsule Take 1 capsule (20 mg total) by mouth daily. 30 capsule 11   No current facility-administered medications on file prior to visit.        ROS:  All others reviewed and negative.  Objective        PE:  BP (!) 160/92 (BP Location: Right Arm, Patient Position: Sitting, Cuff Size: Normal)   Pulse 84   Ht 5\' 5"  (1.651 m)   Wt 120 lb (54.4 kg)   SpO2 93%   BMI 19.97 kg/m                 Constitutional: Pt appears in NAD               HENT: Head: NCAT.                Right Ear: External ear normal.                 Left Ear: External ear normal.                Eyes: . Pupils are equal, round, and reactive to light. Conjunctivae and EOM are normal               Nose: without d/c or deformity               Neck: Neck supple. Gross normal ROM               Cardiovascular: Normal rate and regular rhythm.                 Pulmonary/Chest: Effort normal and  breath sounds without rales or wheezing.  Abd:  Soft, NT, ND, + BS, no organomegaly               Neurological: Pt is alert. At baseline orientation, motor grossly intact               Skin: Skin is warm. No rashes, no other new lesions, LE edema - none               Psychiatric: Pt behavior is normal without agitation   Micro: none  Cardiac tracings I have personally interpreted today:  none  Pertinent Radiological findings (summarize): none   Lab Results  Component Value Date   WBC 14.6 (H) 07/02/2022   HGB 11.1 (L) 07/02/2022   HCT 36.7 07/02/2022   PLT 314 07/02/2022   GLUCOSE 120 (H) 07/02/2022   CHOL 214 (H) 05/29/2019   TRIG 70.0 05/29/2019   HDL 70.90 05/29/2019   LDLDIRECT 121.4 12/02/2010   LDLCALC 129 (H) 05/29/2019   ALT 10 03/10/2022   AST 14 03/10/2022   NA 137 07/02/2022   K 5.7 (H) 07/02/2022   CL 110 07/02/2022   CREATININE 2.26 (H) 07/02/2022   BUN 37 (H) 07/02/2022   CO2 20 (L) 07/02/2022   TSH 1.43 05/29/2019   INR 0.9 07/01/2022   HGBA1C 5.9 05/29/2019   Assessment/Plan:  Esty Biscardi is a 71 y.o. White or Caucasian [1] female with  has a past medical history of ABDOMINAL TENDERNESS (01/21/2011), ACUTE GINGIVITIS NONPLAQUE INDUCED (06/11/2010), ADD (09/05/2008), Alcohol abuse, in remission (09/27/2007), ALLERGIC RHINITIS (03/02/2009), Anemia, ANXIETY (07/02/2007), BACK PAIN (05/19/2009), CHRONIC OBSTRUCTIVE PULMONARY DISEASE, ACUTE EXACERBATION (08/06/2009), COPD (07/02/2007), DEGENERATIVE JOINT DISEASE, CERVICAL SPINE (07/02/2007), DEPRESSION (07/02/2007), Dyspnea, GASTROENTERITIS, VIRAL (11/29/2010), GERD (01/21/2011), Headache(784.0) (07/02/2007), History of kidney stones, Hypertension, HYPOTHYROIDISM (07/02/2007), Lumbar disc disease, Lumbar pain with radiation down right leg (06/01/2011), OSTEOARTHRITIS (07/02/2007), Other and unspecified ovarian cyst (05/19/2009), PAIN, CHRONIC, DUE TO TRAUMA (07/02/2007), PELVIC  PAIN (03/29/2010), UTI  (05/19/2009), and Vitamin D insufficiency.  Hyperlipidemia Lab Results  Component Value Date   LDLCALC 129 (H) 05/29/2019   Uncontrolled, for lower chol diet, pt decliens statin for now   Hyperglycemia Lab Results  Component Value Date   HGBA1C 5.9 05/29/2019   Stable, pt to continue current medical treatment  - diet, wt control   Essential hypertension BP Readings from Last 3 Encounters:  10/10/22 (!) 160/92  07/02/22 (!) 120/59  07/01/22 (!) 166/92   Now overcontrolled, cannot tolerate 300 mg irbesartan so for decrease to 150 mg qd  Followup: Return in about 3 months (around 01/10/2023).  Kathy Cower, MD 10/10/2022 4:29 PM [facility]

## 2022-10-10 NOTE — Assessment & Plan Note (Addendum)
Lab Results  Component Value Date   LDLCALC 129 (H) 05/29/2019   Uncontrolled, for lower chol diet, pt decliens statin for now

## 2022-10-20 ENCOUNTER — Other Ambulatory Visit (HOSPITAL_COMMUNITY): Payer: Self-pay

## 2022-12-08 ENCOUNTER — Ambulatory Visit (INDEPENDENT_AMBULATORY_CARE_PROVIDER_SITE_OTHER): Payer: Medicare Other | Admitting: Internal Medicine

## 2022-12-08 ENCOUNTER — Encounter: Payer: Self-pay | Admitting: Internal Medicine

## 2022-12-08 ENCOUNTER — Other Ambulatory Visit (HOSPITAL_COMMUNITY): Payer: Self-pay

## 2022-12-08 VITALS — BP 138/82 | HR 81 | Temp 97.8°F | Ht 65.0 in | Wt 123.0 lb

## 2022-12-08 DIAGNOSIS — F17200 Nicotine dependence, unspecified, uncomplicated: Secondary | ICD-10-CM

## 2022-12-08 DIAGNOSIS — J441 Chronic obstructive pulmonary disease with (acute) exacerbation: Secondary | ICD-10-CM | POA: Diagnosis not present

## 2022-12-08 DIAGNOSIS — R062 Wheezing: Secondary | ICD-10-CM

## 2022-12-08 DIAGNOSIS — R197 Diarrhea, unspecified: Secondary | ICD-10-CM | POA: Diagnosis not present

## 2022-12-08 DIAGNOSIS — F411 Generalized anxiety disorder: Secondary | ICD-10-CM | POA: Diagnosis not present

## 2022-12-08 DIAGNOSIS — R350 Frequency of micturition: Secondary | ICD-10-CM | POA: Diagnosis not present

## 2022-12-08 LAB — CBC WITH DIFFERENTIAL/PLATELET
Basophils Absolute: 0 10*3/uL (ref 0.0–0.1)
Basophils Relative: 0.5 % (ref 0.0–3.0)
Eosinophils Absolute: 0.2 10*3/uL (ref 0.0–0.7)
Eosinophils Relative: 3 % (ref 0.0–5.0)
HCT: 35.8 % — ABNORMAL LOW (ref 36.0–46.0)
Hemoglobin: 11.6 g/dL — ABNORMAL LOW (ref 12.0–15.0)
Lymphocytes Relative: 40 % (ref 12.0–46.0)
Lymphs Abs: 2.1 10*3/uL (ref 0.7–4.0)
MCHC: 32.2 g/dL (ref 30.0–36.0)
MCV: 84 fl (ref 78.0–100.0)
Monocytes Absolute: 0.5 10*3/uL (ref 0.1–1.0)
Monocytes Relative: 8.6 % (ref 3.0–12.0)
Neutro Abs: 2.5 10*3/uL (ref 1.4–7.7)
Neutrophils Relative %: 47.9 % (ref 43.0–77.0)
Platelets: 346 10*3/uL (ref 150.0–400.0)
RBC: 4.27 Mil/uL (ref 3.87–5.11)
RDW: 13.1 % (ref 11.5–15.5)
WBC: 5.3 10*3/uL (ref 4.0–10.5)

## 2022-12-08 LAB — HEPATIC FUNCTION PANEL
ALT: 19 U/L (ref 0–35)
AST: 21 U/L (ref 0–37)
Albumin: 4 g/dL (ref 3.5–5.2)
Alkaline Phosphatase: 80 U/L (ref 39–117)
Bilirubin, Direct: 0.1 mg/dL (ref 0.0–0.3)
Total Bilirubin: 0.3 mg/dL (ref 0.2–1.2)
Total Protein: 7.2 g/dL (ref 6.0–8.3)

## 2022-12-08 LAB — URINALYSIS, ROUTINE W REFLEX MICROSCOPIC
Bilirubin Urine: NEGATIVE
Ketones, ur: NEGATIVE
Nitrite: NEGATIVE
Specific Gravity, Urine: 1.02 (ref 1.000–1.030)
Urine Glucose: NEGATIVE
Urobilinogen, UA: 0.2 (ref 0.0–1.0)
pH: 6 (ref 5.0–8.0)

## 2022-12-08 LAB — BASIC METABOLIC PANEL
BUN: 38 mg/dL — ABNORMAL HIGH (ref 6–23)
CO2: 24 mEq/L (ref 19–32)
Calcium: 9.2 mg/dL (ref 8.4–10.5)
Chloride: 105 mEq/L (ref 96–112)
Creatinine, Ser: 1.67 mg/dL — ABNORMAL HIGH (ref 0.40–1.20)
GFR: 30.64 mL/min — ABNORMAL LOW (ref >60.00)
Glucose, Bld: 89 mg/dL (ref 70–99)
Potassium: 4.4 mEq/L (ref 3.5–5.1)
Sodium: 137 mEq/L (ref 135–145)

## 2022-12-08 MED ORDER — ALPRAZOLAM 0.5 MG PO TABS
0.5000 mg | ORAL_TABLET | Freq: Two times a day (BID) | ORAL | 5 refills | Status: DC | PRN
Start: 2022-12-08 — End: 2023-04-14
  Filled 2022-12-08: qty 60, 30d supply, fill #0
  Filled 2023-01-24: qty 60, 30d supply, fill #1
  Filled 2023-03-11: qty 60, 30d supply, fill #2

## 2022-12-08 MED ORDER — PREDNISONE 10 MG PO TABS
ORAL_TABLET | ORAL | 0 refills | Status: AC
  Filled 2022-12-08: qty 18, 9d supply, fill #0

## 2022-12-08 MED ORDER — AZITHROMYCIN 250 MG PO TABS
ORAL_TABLET | ORAL | 1 refills | Status: AC
  Filled 2022-12-08: qty 6, 5d supply, fill #0
  Filled 2023-03-11: qty 6, 5d supply, fill #1

## 2022-12-08 MED ORDER — AMPHETAMINE-DEXTROAMPHETAMINE 30 MG PO TABS
30.0000 mg | ORAL_TABLET | Freq: Two times a day (BID) | ORAL | 0 refills | Status: DC
Start: 2022-12-08 — End: 2023-01-24
  Filled 2022-12-08: qty 60, 30d supply, fill #0

## 2022-12-08 MED ORDER — IRBESARTAN 150 MG PO TABS
150.0000 mg | ORAL_TABLET | Freq: Every day | ORAL | 3 refills | Status: DC
Start: 2022-12-08 — End: 2023-10-20
  Filled 2022-12-08: qty 90, 90d supply, fill #0
  Filled 2023-03-11: qty 90, 90d supply, fill #1
  Filled 2023-06-02: qty 90, 90d supply, fill #2
  Filled 2023-08-31: qty 90, 90d supply, fill #3

## 2022-12-08 MED ORDER — METHYLPREDNISOLONE ACETATE 80 MG/ML IJ SUSP
80.0000 mg | Freq: Once | INTRAMUSCULAR | Status: AC
  Administered 2022-12-08: 80 mg via INTRAMUSCULAR

## 2022-12-08 MED ORDER — HYDROCODONE BIT-HOMATROP MBR 5-1.5 MG/5ML PO SOLN
5.0000 mL | Freq: Four times a day (QID) | ORAL | 0 refills | Status: AC | PRN
  Filled 2022-12-08: qty 180, 9d supply, fill #0

## 2022-12-08 MED ORDER — FLUTICASONE-SALMETEROL 250-50 MCG/ACT IN AEPB
1.0000 | INHALATION_SPRAY | Freq: Two times a day (BID) | RESPIRATORY_TRACT | 3 refills | Status: DC | PRN
  Filled 2022-12-08: qty 60, 30d supply, fill #0

## 2022-12-08 NOTE — Patient Instructions (Signed)
You had the steroid shot today  Please take all new medication as prescribed - the antibiotic, cough medicine, and prednisone  Please continue all other medications as before, and refills have been done if requested - the adderall, xanax, avapro 150 mg, and advair  Please have the pharmacy call with any other refills you may need.  Please continue your efforts at being more active, low cholesterol diet, and weight control.  Please keep your appointments with your specialists as you may have planned  Please go to the LAB at the blood drawing area for the tests to be done  You will be contacted by phone if any changes need to be made immediately.  Otherwise, you will receive a letter about your results with an explanation, but please check with MyChart first.  Please remember to sign up for MyChart if you have not done so, as this will be important to you in the future with finding out test results, communicating by private email, and scheduling acute appointments online when needed.

## 2022-12-08 NOTE — Progress Notes (Signed)
Patient ID: Kathy Vargas, female   DOB: 1951-09-24, 72 y.o.   MRN: [id number]        Chief Complaint: follow up cough, wheezing, left abd pain, htn, smoker       HPI:  Kathy Vargas is a 72 y.o. female Here with acute onset mild to mod 2-3 days ST, HA, general weakness and malaise, with prod cough greenish sputum, but Pt denies chest pain, increased sob or doe, wheezing, orthopnea, PND, increased LE swelling, palpitations, dizziness or syncope, except for mild sob and wheezing starting last pm.  BP has been improved, taking avapro 150 mg tolerating well.  Does have also c/o left sided upper and lower quad nausea, bloating, low energy, fatigue (but no missed work).  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  Due for adderall and xanax refills, asking for increased xanax, nervous today, but I try to let her stay with current dosing.  Still smoking, not ready to quit.  Needs refill advair, out for several wks.        Wt Readings from Last 3 Encounters:  12/08/22 123 lb (55.8 kg)  10/10/22 120 lb (54.4 kg)  10/05/22 120 lb 12.8 oz (54.8 kg)   BP Readings from Last 3 Encounters:  12/08/22 138/82  10/10/22 (!) 160/92  07/02/22 (!) 120/59         Past Medical History:  Diagnosis Date   ABDOMINAL TENDERNESS 01/21/2011   ACUTE GINGIVITIS NONPLAQUE INDUCED 06/11/2010   ADD 09/05/2008   Alcohol abuse, in remission 09/27/2007   ALLERGIC RHINITIS 03/02/2009   Anemia    ANXIETY 07/02/2007   BACK PAIN 05/19/2009   CHRONIC OBSTRUCTIVE PULMONARY DISEASE, ACUTE EXACERBATION 08/06/2009   COPD 07/02/2007   DEGENERATIVE JOINT DISEASE, CERVICAL SPINE 07/02/2007   DEPRESSION 07/02/2007   Dyspnea    GASTROENTERITIS, VIRAL 11/29/2010   GERD 01/21/2011   Headache(784.0) 07/02/2007   History of kidney stones    Hypertension    HYPOTHYROIDISM 07/02/2007   Lumbar disc disease    Lumbar  pain with radiation down right leg 06/01/2011   OSTEOARTHRITIS 07/02/2007   Other and unspecified ovarian cyst 05/19/2009   PAIN, CHRONIC, DUE TO TRAUMA 07/02/2007   PELVIC  PAIN 03/29/2010   UTI 05/19/2009   Vitamin D insufficiency    Past Surgical History:  Procedure Laterality Date   ABDOMINAL HYSTERECTOMY     CATARACT EXTRACTION W/ INTRAOCULAR LENS IMPLANT Bilateral    CHOLECYSTECTOMY     CYSTOSCOPY/URETEROSCOPY/HOLMIUM LASER/STENT PLACEMENT Left 07/01/2022   Procedure: CYSTOSCOPY/ RETROGRADE/URETEROSCOPY/STENT PLACEMENT;  Surgeon: Jannifer Hick, MD;  Location: [facility];  Service: Urology;  Laterality: Left;   eyebrow lift     IR URETERAL STENT LEFT NEW ACCESS W/O SEP NEPHROSTOMY CATH  07/01/2022   LIPOSUCTION     LUMBAR DISC SURGERY     L4, L5   NEPHROLITHOTOMY Left 07/01/2022   Procedure: NEPHROLITHOTOMY PERCUTANEOUS/ INTERVENTIONAL RADIOLOGY TO PLACE LEFT NEPHROSTOMY TUBE PRIOR;  Surgeon: Jannifer Hick, MD;  Location: [facility];  Service: Urology;  Laterality: Left;  ONLY NEEDS 120 MIN FOR ALL PROCEDURES   URETEROSCOPY WITH HOLMIUM LASER LITHOTRIPSY Right 04/12/2022   Procedure: URETEROSCOPY WITH HOLMIUM LASER LITHOTRIPSY, STENT PLACEMENT;  Surgeon: Despina Arias, MD;  Location: [facility];  Service: Urology;  Laterality: Right;    reports that she has been smoking cigarettes. She started smoking about 60 years ago.  She has a 28.00 pack-year smoking history. She has never used smokeless tobacco. She reports that she does not currently use alcohol. She reports that she does not use drugs. family history includes Alcohol abuse in an other family member; Anxiety disorder in an other family member; Coronary artery disease in an other family member; Depression in an other family member; Stroke in an other family member. Allergies  Allergen Reactions   Morphine Nausea And Vomiting   Ceftin [Cefuroxime]     Nausea and vomiting   Current Outpatient Medications on File Prior to Visit   Medication Sig Dispense Refill   albuterol (VENTOLIN HFA) 108 (90 Base) MCG/ACT inhaler Inhale 2 puffs into the lungs every 6 (six) hours as needed for wheezing or shortness of breath. 18 g 11   Ascorbic Acid (VITAMIN C PO) Take 1 tablet by mouth in the morning.     Biotin w/ Vitamins C & E (HAIR/SKIN/NAILS PO) Take 1 tablet by mouth daily.     Calcium-Magnesium-Zinc (CAL-MAG-ZINC PO) Take 1 tablet by mouth at bedtime.     cetirizine (ZYRTEC) 10 MG tablet Take 1 tablet (10 mg total) by mouth daily as needed for allergies. 90 tablet 3   Cholecalciferol (VITAMIN D3 PO) Take 1 tablet by mouth in the morning.     diclofenac (VOLTAREN) 75 MG EC tablet Take 1 tablet (75 mg total) by mouth 2 (two) times daily as needed. 60 tablet 5   fluticasone (CUTIVATE) 0.005 % ointment Apply to the skin 2 times daily if needed 30 g 1   hydrALAZINE (APRESOLINE) 50 MG tablet Take 1 tablet (50 mg total) by mouth 3 (three) times daily. 90 tablet 1   iron polysaccharides (NU-IRON) 150 MG capsule Take 1 capsule (150 mg total) by mouth daily. 90 capsule 1   nystatin cream (MYCOSTATIN) Apply to lips once a day 30 g 1   pantoprazole (PROTONIX) 40 MG tablet Take 1 tablet (40 mg total) by mouth daily. 90 tablet 3   docusate sodium (COLACE) 100 MG capsule Take 1 capsule (100 mg total) by mouth daily as needed for up to 30 doses. (Patient not taking: Reported on 10/10/2022) 30 capsule 0   [DISCONTINUED] FLUoxetine (PROZAC) 20 MG capsule Take 1 capsule (20 mg total) by mouth daily. 30 capsule 11   No current facility-administered medications on file prior to visit.        ROS:  All others reviewed and negative.  Objective        PE:  BP 138/82 (BP Location: Left Arm, Patient Position: Sitting, Cuff Size: Large)   Pulse 81   Temp 97.8 F (36.6 C) (Oral)   Ht 5\' 5"  (1.651 m)   Wt 123 lb (55.8 kg)   SpO2 98%   BMI 20.47 kg/m                 Constitutional: Pt appears in NAD, mild ill               HENT: Head: NCAT.                 Right Ear: External ear normal.                 Left Ear: External ear normal. Bilat tm's with mild erythema.  Max sinus areas non  tender.  Pharynx with mild erythema, no exudate                 Eyes: . Pupils are equal,  round, and reactive to light. Conjunctivae and EOM are normal               Nose: without d/c or deformity               Neck: Neck supple. Gross normal ROM               Cardiovascular: Normal rate and regular rhythm.                 Pulmonary/Chest: Effort normal and breath sounds without rales but with few bialteral wheezing.                Abd:  Soft, NT, ND, + BS, no organomegaly               Neurological: Pt is alert. At baseline orientation, motor grossly intact               Skin: Skin is warm. No rashes, no other new lesions, LE edema - none               Psychiatric: Pt behavior is normal without agitation , mod nervous  Micro: none  Cardiac tracings I have personally interpreted today:  none  Pertinent Radiological findings (summarize): none   Lab Results  Component Value Date   WBC 5.3 12/08/2022   HGB 11.6 (L) 12/08/2022   HCT 35.8 (L) 12/08/2022   PLT 346.0 12/08/2022   GLUCOSE 89 12/08/2022   CHOL 214 (H) 05/29/2019   TRIG 70.0 05/29/2019   HDL 70.90 05/29/2019   LDLDIRECT 121.4 12/02/2010   LDLCALC 129 (H) 05/29/2019   ALT 19 12/08/2022   AST 21 12/08/2022   NA 137 12/08/2022   K 4.4 12/08/2022   CL 105 12/08/2022   CREATININE 1.67 (H) 12/08/2022   BUN 38 (H) 12/08/2022   CO2 24 12/08/2022   TSH 1.43 05/29/2019   INR 0.9 07/01/2022   HGBA1C 5.9 05/29/2019   Assessment/Plan:  Kathy Vargas is a 72 y.o. White or Caucasian [1] female with  has a past medical history of ABDOMINAL TENDERNESS (01/21/2011), ACUTE GINGIVITIS NONPLAQUE INDUCED (06/11/2010), ADD (09/05/2008), Alcohol abuse, in remission (09/27/2007), ALLERGIC RHINITIS (03/02/2009), Anemia, ANXIETY (07/02/2007), BACK PAIN (05/19/2009), CHRONIC OBSTRUCTIVE PULMONARY  DISEASE, ACUTE EXACERBATION (08/06/2009), COPD (07/02/2007), DEGENERATIVE JOINT DISEASE, CERVICAL SPINE (07/02/2007), DEPRESSION (07/02/2007), Dyspnea, GASTROENTERITIS, VIRAL (11/29/2010), GERD (01/21/2011), Headache(784.0) (07/02/2007), History of kidney stones, Hypertension, HYPOTHYROIDISM (07/02/2007), Lumbar disc disease, Lumbar pain with radiation down right leg (06/01/2011), OSTEOARTHRITIS (07/02/2007), Other and unspecified ovarian cyst (05/19/2009), PAIN, CHRONIC, DUE TO TRAUMA (07/02/2007), PELVIC  PAIN (03/29/2010), UTI (05/19/2009), and Vitamin D insufficiency.  COPD exacerbation (HCC) Mild to mod, for depomedrol IM 80,  zpack, prednisone taper, cough med prn, and cont inhaler prn, also restart advair bid  to f/u any worsening symptoms or concerns  Diarrhea Mild to mod, etiology unclear, exam benign, for stool cx, hold on specific tx for bacillus cereus about which she is concerned,  to f/u any worsening symptoms or concerns  Urine frequency Given GI symptoms it is prudent to check urine culture as well, tx pending results  Anxiety state Pt to continue xanax prn, no changes today  Smoker Pt counsled to quit, pt not ready  Followup: Return in about 5 weeks (around 01/13/2023).  Cathlean Cower, MD 12/10/2022 2:59 PM [facility]

## 2022-12-10 ENCOUNTER — Other Ambulatory Visit (HOSPITAL_COMMUNITY): Payer: Self-pay

## 2022-12-10 ENCOUNTER — Other Ambulatory Visit: Payer: Self-pay | Admitting: Internal Medicine

## 2022-12-10 ENCOUNTER — Encounter: Payer: Self-pay | Admitting: Internal Medicine

## 2022-12-10 DIAGNOSIS — R350 Frequency of micturition: Secondary | ICD-10-CM | POA: Insufficient documentation

## 2022-12-10 DIAGNOSIS — R197 Diarrhea, unspecified: Secondary | ICD-10-CM | POA: Insufficient documentation

## 2022-12-10 LAB — URINE CULTURE

## 2022-12-10 MED ORDER — AMOXICILLIN 500 MG PO CAPS
500.0000 mg | ORAL_CAPSULE | Freq: Three times a day (TID) | ORAL | 0 refills | Status: AC
  Filled 2022-12-10: qty 30, 10d supply, fill #0

## 2022-12-10 NOTE — Assessment & Plan Note (Signed)
Mild to mod, etiology unclear, exam benign, for stool cx, hold on specific tx for bacillus cereus about which she is concerned,  to f/u any worsening symptoms or concerns

## 2022-12-10 NOTE — Assessment & Plan Note (Signed)
Pt counsled to quit, pt not ready °

## 2022-12-10 NOTE — Assessment & Plan Note (Signed)
Pt to continue xanax prn, no changes today

## 2022-12-10 NOTE — Assessment & Plan Note (Signed)
Given GI symptoms it is prudent to check urine culture as well, tx pending results

## 2022-12-10 NOTE — Assessment & Plan Note (Addendum)
Mild to mod, for depomedrol IM 80,  zpack, prednisone taper, cough med prn, and cont inhaler prn, also restart advair bid  to f/u any worsening symptoms or concerns

## 2022-12-12 ENCOUNTER — Other Ambulatory Visit (HOSPITAL_COMMUNITY): Payer: Self-pay

## 2022-12-12 DIAGNOSIS — R197 Diarrhea, unspecified: Secondary | ICD-10-CM | POA: Diagnosis not present

## 2022-12-17 LAB — STOOL CULTURE: E coli, Shiga toxin Assay: NEGATIVE

## 2023-01-13 ENCOUNTER — Ambulatory Visit: Payer: Medicare Other | Admitting: Internal Medicine

## 2023-01-24 ENCOUNTER — Other Ambulatory Visit (HOSPITAL_COMMUNITY): Payer: Self-pay

## 2023-01-24 ENCOUNTER — Other Ambulatory Visit: Payer: Self-pay | Admitting: Internal Medicine

## 2023-01-24 MED ORDER — AMPHETAMINE-DEXTROAMPHETAMINE 30 MG PO TABS
30.0000 mg | ORAL_TABLET | Freq: Two times a day (BID) | ORAL | 0 refills | Status: DC
  Filled 2023-01-24: qty 60, 30d supply, fill #0

## 2023-03-08 ENCOUNTER — Other Ambulatory Visit (HOSPITAL_COMMUNITY): Payer: Self-pay

## 2023-03-11 ENCOUNTER — Other Ambulatory Visit (HOSPITAL_COMMUNITY): Payer: Self-pay

## 2023-03-15 ENCOUNTER — Telehealth: Payer: Self-pay

## 2023-03-15 ENCOUNTER — Other Ambulatory Visit (HOSPITAL_COMMUNITY): Payer: Self-pay

## 2023-03-15 MED ORDER — AMPHETAMINE-DEXTROAMPHETAMINE 30 MG PO TABS
30.0000 mg | ORAL_TABLET | Freq: Two times a day (BID) | ORAL | 0 refills | Status: DC
  Filled 2023-03-15: qty 60, 30d supply, fill #0

## 2023-03-15 NOTE — Telephone Encounter (Signed)
Pt has asked that a refill for her   amphetamine-dextroamphetamine (ADDERALL) 30 MG tablet   be sent to the [facility].

## 2023-03-15 NOTE — Telephone Encounter (Signed)
Done erx 

## 2023-04-14 ENCOUNTER — Encounter: Payer: Self-pay | Admitting: Internal Medicine

## 2023-04-14 ENCOUNTER — Ambulatory Visit (INDEPENDENT_AMBULATORY_CARE_PROVIDER_SITE_OTHER): Payer: Medicare Other | Admitting: Internal Medicine

## 2023-04-14 ENCOUNTER — Other Ambulatory Visit (HOSPITAL_COMMUNITY): Payer: Self-pay

## 2023-04-14 VITALS — BP 140/78 | HR 82 | Temp 98.3°F | Ht 65.0 in | Wt 119.0 lb

## 2023-04-14 DIAGNOSIS — I10 Essential (primary) hypertension: Secondary | ICD-10-CM

## 2023-04-14 DIAGNOSIS — F17200 Nicotine dependence, unspecified, uncomplicated: Secondary | ICD-10-CM

## 2023-04-14 DIAGNOSIS — E538 Deficiency of other specified B group vitamins: Secondary | ICD-10-CM

## 2023-04-14 DIAGNOSIS — E559 Vitamin D deficiency, unspecified: Secondary | ICD-10-CM

## 2023-04-14 DIAGNOSIS — R062 Wheezing: Secondary | ICD-10-CM

## 2023-04-14 DIAGNOSIS — J438 Other emphysema: Secondary | ICD-10-CM | POA: Diagnosis not present

## 2023-04-14 DIAGNOSIS — Z0001 Encounter for general adult medical examination with abnormal findings: Secondary | ICD-10-CM

## 2023-04-14 DIAGNOSIS — E785 Hyperlipidemia, unspecified: Secondary | ICD-10-CM

## 2023-04-14 DIAGNOSIS — R739 Hyperglycemia, unspecified: Secondary | ICD-10-CM | POA: Diagnosis not present

## 2023-04-14 DIAGNOSIS — R1013 Epigastric pain: Secondary | ICD-10-CM

## 2023-04-14 MED ORDER — AMPHETAMINE-DEXTROAMPHETAMINE 30 MG PO TABS
30.0000 mg | ORAL_TABLET | Freq: Two times a day (BID) | ORAL | 0 refills | Status: DC
  Filled 2023-04-14: qty 60, 30d supply, fill #0

## 2023-04-14 MED ORDER — METHYLPREDNISOLONE ACETATE 80 MG/ML IJ SUSP
80.0000 mg | Freq: Once | INTRAMUSCULAR | Status: AC
Start: 2023-04-14 — End: 2023-04-14
  Administered 2023-04-14: 80 mg via INTRAMUSCULAR

## 2023-04-14 MED ORDER — ALPRAZOLAM 0.5 MG PO TABS
0.5000 mg | ORAL_TABLET | Freq: Two times a day (BID) | ORAL | 5 refills | Status: DC | PRN
  Filled 2023-04-14: qty 60, 30d supply, fill #0
  Filled 2023-06-02: qty 60, 30d supply, fill #1
  Filled 2023-07-18: qty 60, 30d supply, fill #2
  Filled 2023-08-31: qty 60, 30d supply, fill #3
  Filled 2023-10-06: qty 60, 30d supply, fill #4

## 2023-04-14 NOTE — Progress Notes (Unsigned)
Patient ID: Kathy Vargas, female   DOB: December 22, 1950, 72 y.o.   MRN: [id number]         Chief Complaint:: wellness exam and Medical Management of Chronic Issues (Follow up , having some wheezing )  , elevated BP HTN, wt loss with epigastric pain and bloating, low vit d, hld, hyperglycemia       HPI:  Kathy Vargas is a 72 y.o. female here for wellness exam; for tdap and shingrix at pharmacy, declines referral pulm for LDCT screening, o/w up to date.  Still smoking, not ready to quit                        Also has had mild wheezing with minor sob, admits to not taking advair scheduled, only takes prn bc fearing it will stop working at some point if she gets used to it.  BP has been ok at home.  For some reason was unable for GI referral as per last referral, pt has had persistent further wt loss, epigastric pains, and bloating.  Asks for referral again to GI.  Denies worsening reflux, dysphagia, n/v, bowel change or blood.  Pt denies chest pain, increased sob or doe, wheezing, orthopnea, PND, increased LE swelling, palpitations, dizziness or syncope.   Pt denies polydipsia, polyuria, or new focal neuro s/s.    Wt Readings from Last 3 Encounters:  04/14/23 119 lb (54 kg)  12/08/22 123 lb (55.8 kg)  10/10/22 120 lb (54.4 kg)   BP Readings from Last 3 Encounters:  04/14/23 (!) 140/78  12/08/22 138/82  10/10/22 (!) 160/92   Immunization History  Administered Date(s) Administered   Fluad Quad(high Dose 65+) 10/02/2019   Influenza Split 09/07/2011, 10/11/2012   Influenza, High Dose Seasonal PF 09/27/2016, 08/15/2017, 09/04/2018   Influenza,inj,Quad PF,6+ Mos 08/22/2013, 10/15/2014, 11/26/2015   PFIZER(Purple Top)SARS-COV-2 Vaccination 03/07/2020   Pneumococcal Conjugate-13 11/26/2015   Pneumococcal Polysaccharide-23 05/30/2017   Td 12/14/2009   Zoster, Live 10/15/2014   Health Maintenance Due  Topic Date Due   DTaP/Tdap/Td (2 - Tdap) 12/15/2019   Medicare Annual Wellness (AWV)   09/16/2022      Past Medical History:  Diagnosis Date   ABDOMINAL TENDERNESS 01/21/2011   ACUTE GINGIVITIS NONPLAQUE INDUCED 06/11/2010   ADD 09/05/2008   Alcohol abuse, in remission 09/27/2007   ALLERGIC RHINITIS 03/02/2009   Anemia    ANXIETY 07/02/2007   BACK PAIN 05/19/2009   CHRONIC OBSTRUCTIVE PULMONARY DISEASE, ACUTE EXACERBATION 08/06/2009   COPD 07/02/2007   DEGENERATIVE JOINT DISEASE, CERVICAL SPINE 07/02/2007   DEPRESSION 07/02/2007   Dyspnea    GASTROENTERITIS, VIRAL 11/29/2010   GERD 01/21/2011   Headache(784.0) 07/02/2007   History of kidney stones    Hypertension    HYPOTHYROIDISM 07/02/2007   Lumbar disc disease    Lumbar pain with radiation down right leg 06/01/2011   OSTEOARTHRITIS 07/02/2007   Other and unspecified ovarian cyst 05/19/2009   PAIN, CHRONIC, DUE TO TRAUMA 07/02/2007   PELVIC  PAIN 03/29/2010   UTI 05/19/2009   Vitamin D insufficiency    Past Surgical History:  Procedure Laterality Date   ABDOMINAL HYSTERECTOMY     CATARACT EXTRACTION W/ INTRAOCULAR LENS IMPLANT Bilateral    CHOLECYSTECTOMY     CYSTOSCOPY/URETEROSCOPY/HOLMIUM LASER/STENT PLACEMENT Left 07/01/2022   Procedure: CYSTOSCOPY/ RETROGRADE/URETEROSCOPY/STENT PLACEMENT;  Surgeon: Jannifer Hick, MD;  Location: [facility];  Service: Urology;  Laterality: Left;   eyebrow lift     IR URETERAL STENT LEFT NEW  ACCESS W/O SEP NEPHROSTOMY CATH  07/01/2022   LIPOSUCTION     LUMBAR DISC SURGERY     L4, L5   NEPHROLITHOTOMY Left 07/01/2022   Procedure: NEPHROLITHOTOMY PERCUTANEOUS/ INTERVENTIONAL RADIOLOGY TO PLACE LEFT NEPHROSTOMY TUBE PRIOR;  Surgeon: Jannifer Hick, MD;  Location: [facility];  Service: Urology;  Laterality: Left;  ONLY NEEDS 120 MIN FOR ALL PROCEDURES   URETEROSCOPY WITH HOLMIUM LASER LITHOTRIPSY Right 04/12/2022   Procedure: URETEROSCOPY WITH HOLMIUM LASER LITHOTRIPSY, STENT PLACEMENT;  Surgeon: Despina Arias, MD;  Location: [facility];  Service: Urology;  Laterality: Right;     reports that she has been smoking cigarettes. She started smoking about 60 years ago. She has a 28.00 pack-year smoking history. She has never used smokeless tobacco. She reports that she does not currently use alcohol. She reports that she does not use drugs. family history includes Alcohol abuse in an other family member; Anxiety disorder in an other family member; Coronary artery disease in an other family member; Depression in an other family member; Stroke in an other family member. Allergies  Allergen Reactions   Morphine Nausea And Vomiting   Ceftin [Cefuroxime]     Nausea and vomiting   Current Outpatient Medications on File Prior to Visit  Medication Sig Dispense Refill   albuterol (VENTOLIN HFA) 108 (90 Base) MCG/ACT inhaler Inhale 2 puffs into the lungs every 6 (six) hours as needed for wheezing or shortness of breath. 18 g 11   Ascorbic Acid (VITAMIN C PO) Take 1 tablet by mouth in the morning.     Biotin w/ Vitamins C & E (HAIR/SKIN/NAILS PO) Take 1 tablet by mouth daily.     Calcium-Magnesium-Zinc (CAL-MAG-ZINC PO) Take 1 tablet by mouth at bedtime.     cetirizine (ZYRTEC) 10 MG tablet Take 1 tablet (10 mg total) by mouth daily as needed for allergies. 90 tablet 3   Cholecalciferol (VITAMIN D3 PO) Take 1 tablet by mouth in the morning.     diclofenac (VOLTAREN) 75 MG EC tablet Take 1 tablet (75 mg total) by mouth 2 (two) times daily as needed. 60 tablet 5   docusate sodium (COLACE) 100 MG capsule Take 1 capsule (100 mg total) by mouth daily as needed for up to 30 doses. 30 capsule 0   fluticasone (CUTIVATE) 0.005 % ointment Apply to the skin 2 times daily if needed 30 g 1   fluticasone-salmeterol (ADVAIR DISKUS) 250-50 MCG/ACT AEPB Inhale 1 puff into the lungs 2 (two) times daily as needed (Asthma). 180 each 3   hydrALAZINE (APRESOLINE) 50 MG tablet Take 1 tablet (50 mg total) by mouth 3 (three) times daily. 90 tablet 1   irbesartan (AVAPRO) 150 MG tablet Take 1 tablet (150 mg  total) by mouth daily. 90 tablet 3   iron polysaccharides (NU-IRON) 150 MG capsule Take 1 capsule (150 mg total) by mouth daily. 90 capsule 1   nystatin cream (MYCOSTATIN) Apply to lips once a day 30 g 1   pantoprazole (PROTONIX) 40 MG tablet Take 1 tablet (40 mg total) by mouth daily. 90 tablet 3   [DISCONTINUED] FLUoxetine (PROZAC) 20 MG capsule Take 1 capsule (20 mg total) by mouth daily. 30 capsule 11   No current facility-administered medications on file prior to visit.        ROS:  All others reviewed and negative.  Objective        PE:  BP (!) 140/78 (BP Location: Right Arm, Patient Position: Sitting, Cuff Size: Normal)  Pulse 82   Temp 98.3 F (36.8 C) (Oral)   Ht 5\' 5"  (1.651 m)   Wt 119 lb (54 kg)   SpO2 99%   BMI 19.80 kg/m                 Constitutional: Pt appears in NAD               HENT: Head: NCAT.                Right Ear: External ear normal.                 Left Ear: External ear normal.                Eyes: . Pupils are equal, round, and reactive to light. Conjunctivae and EOM are normal               Nose: without d/c or deformity               Neck: Neck supple. Gross normal ROM               Cardiovascular: Normal rate and regular rhythm.                 Pulmonary/Chest: Effort normal and breath sounds without rales or wheezing.                Abd:  Soft, NT, ND, + BS, no organomegaly               Neurological: Pt is alert. At baseline orientation, motor grossly intact               Skin: Skin is warm. No rashes, no other new lesions, LE edema - none               Psychiatric: Pt behavior is normal without agitation   Micro: none  Cardiac tracings I have personally interpreted today:  none  Pertinent Radiological findings (summarize): none   Lab Results  Component Value Date   WBC 5.3 12/08/2022   HGB 11.6 (L) 12/08/2022   HCT 35.8 (L) 12/08/2022   PLT 346.0 12/08/2022   GLUCOSE 89 12/08/2022   CHOL 214 (H) 05/29/2019   TRIG 70.0 05/29/2019    HDL 70.90 05/29/2019   LDLDIRECT 121.4 12/02/2010   LDLCALC 129 (H) 05/29/2019   ALT 19 12/08/2022   AST 21 12/08/2022   NA 137 12/08/2022   K 4.4 12/08/2022   CL 105 12/08/2022   CREATININE 1.67 (H) 12/08/2022   BUN 38 (H) 12/08/2022   CO2 24 12/08/2022   TSH 1.43 05/29/2019   INR 0.9 07/01/2022   HGBA1C 5.9 05/29/2019   Assessment/Plan:  Kathy Vargas is a 72 y.o. White or Caucasian [1] female with  has a past medical history of ABDOMINAL TENDERNESS (01/21/2011), ACUTE GINGIVITIS NONPLAQUE INDUCED (06/11/2010), ADD (09/05/2008), Alcohol abuse, in remission (09/27/2007), ALLERGIC RHINITIS (03/02/2009), Anemia, ANXIETY (07/02/2007), BACK PAIN (05/19/2009), CHRONIC OBSTRUCTIVE PULMONARY DISEASE, ACUTE EXACERBATION (08/06/2009), COPD (07/02/2007), DEGENERATIVE JOINT DISEASE, CERVICAL SPINE (07/02/2007), DEPRESSION (07/02/2007), Dyspnea, GASTROENTERITIS, VIRAL (11/29/2010), GERD (01/21/2011), Headache(784.0) (07/02/2007), History of kidney stones, Hypertension, HYPOTHYROIDISM (07/02/2007), Lumbar disc disease, Lumbar pain with radiation down right leg (06/01/2011), OSTEOARTHRITIS (07/02/2007), Other and unspecified ovarian cyst (05/19/2009), PAIN, CHRONIC, DUE TO TRAUMA (07/02/2007), PELVIC  PAIN (03/29/2010), UTI (05/19/2009), and Vitamin D insufficiency.  Encounter for well adult exam with abnormal findings Age and sex appropriate education and counseling updated with regular exercise and diet  Referrals for preventative services - declines pulm referral for LDCT screening Immunizations addressed - for tdap and shingrix at pharmacy Smoking counseling  - pt counsled to quit, pt not ready Evidence for depression or other mood disorder - chronic anxiety stable mild persistent Most recent labs reviewed. I have personally reviewed and have noted: 1) the patient's medical and social history 2) The patient's current medications and supplements 3) The patient's height, weight, and BMI have  been recorded in the chart   COPD (chronic obstructive pulmonary disease) (HCC) Mild uncontrolled, I suggested change to trelegy 1 puff qd, but she wants to simply continue the advair with bid scheduled dosing instead of prn as she has been doing,  to f/u any worsening symptoms or concerns  Vitamin D deficiency Last vitamin D Lab Results  Component Value Date   VD25OH 43.52 05/29/2019   Stable, cont oral replacement   Smoker Pt counsled to quit, pt not ready  Hyperlipidemia Lab Results  Component Value Date   LDLCALC 129 (H) 05/29/2019   Uncontrolled, for lower chol diet, declines statin for now   Hyperglycemia Lab Results  Component Value Date   HGBA1C 5.9 05/29/2019   Stable, pt to continue current medical treatment  - diet,wt control   Essential hypertension BP Readings from Last 3 Encounters:  04/14/23 (!) 140/78  12/08/22 138/82  10/10/22 (!) 160/92   Uncontrolled, likely situational, pt to continue medical treatment hydralazine 50 tid, avapro 150 qd as declines change   Epigastric pain With worsening wt loss, bloating and pain - for GI referral  Followup: Return in about 6 months (around 10/15/2023).  Oliver Barre, MD 04/16/2023 1:05 PM [facility]

## 2023-04-14 NOTE — Patient Instructions (Addendum)
Please have your Shingrix (shingles) shots done at your local pharmacy.  Please continue all other medications as before, and refills have been done if requested.  Please have the pharmacy call with any other refills you may need.  Please continue your efforts at being more active, low cholesterol diet, and weight control.  You are otherwise up to date with prevention measures today.  Please keep your appointments with your specialists as you may have planned  You will be contacted regarding the referral for: Gastroenterology  Please go to the LAB at the blood drawing area for the tests to be done  You will be contacted by phone if any changes need to be made immediately.  Otherwise, you will receive a letter about your results with an explanation, but please check with MyChart first.  Please remember to sign up for MyChart if you have not done so, as this will be important to you in the future with finding out test results, communicating by private email, and scheduling acute appointments online when needed.  Please make an Appointment to return in 6 months, or sooner if needed

## 2023-04-16 ENCOUNTER — Encounter: Payer: Self-pay | Admitting: Internal Medicine

## 2023-04-16 DIAGNOSIS — R1013 Epigastric pain: Secondary | ICD-10-CM | POA: Insufficient documentation

## 2023-04-16 NOTE — Assessment & Plan Note (Signed)
Lab Results  Component Value Date   LDLCALC 129 (H) 05/29/2019   Uncontrolled, for lower chol diet, declines statin for now

## 2023-04-16 NOTE — Assessment & Plan Note (Signed)
BP Readings from Last 3 Encounters:  04/14/23 (!) 140/78  12/08/22 138/82  10/10/22 (!) 160/92   Uncontrolled, likely situational, pt to continue medical treatment hydralazine 50 tid, avapro 150 qd as declines change

## 2023-04-16 NOTE — Assessment & Plan Note (Signed)
Pt counsled to quit, pt not ready °

## 2023-04-16 NOTE — Assessment & Plan Note (Signed)
Last vitamin D °Lab Results  °Component Value Date  ° VD25OH 43.52 05/29/2019  ° °Stable, cont oral replacement ° °

## 2023-04-16 NOTE — Assessment & Plan Note (Signed)
Lab Results  Component Value Date   HGBA1C 5.9 05/29/2019   Stable, pt to continue current medical treatment  - diet, wt control  

## 2023-04-16 NOTE — Assessment & Plan Note (Signed)
Mild uncontrolled, I suggested change to trelegy 1 puff qd, but she wants to simply continue the advair with bid scheduled dosing instead of prn as she has been doing,  to f/u any worsening symptoms or concerns

## 2023-04-16 NOTE — Assessment & Plan Note (Signed)
Age and sex appropriate education and counseling updated with regular exercise and diet Referrals for preventative services - declines pulm referral for LDCT screening Immunizations addressed - for tdap and shingrix at pharmacy Smoking counseling  - pt counsled to quit, pt not ready Evidence for depression or other mood disorder - chronic anxiety stable mild persistent Most recent labs reviewed. I have personally reviewed and have noted: 1) the patient's medical and social history 2) The patient's current medications and supplements 3) The patient's height, weight, and BMI have been recorded in the chart

## 2023-04-16 NOTE — Assessment & Plan Note (Signed)
With worsening wt loss, bloating and pain - for GI referral

## 2023-04-21 ENCOUNTER — Other Ambulatory Visit: Payer: Self-pay | Admitting: Internal Medicine

## 2023-04-21 ENCOUNTER — Encounter: Payer: Self-pay | Admitting: Internal Medicine

## 2023-04-21 ENCOUNTER — Other Ambulatory Visit (HOSPITAL_COMMUNITY): Payer: Self-pay

## 2023-04-21 ENCOUNTER — Other Ambulatory Visit (INDEPENDENT_AMBULATORY_CARE_PROVIDER_SITE_OTHER): Payer: Medicare Other

## 2023-04-21 DIAGNOSIS — R739 Hyperglycemia, unspecified: Secondary | ICD-10-CM | POA: Diagnosis not present

## 2023-04-21 DIAGNOSIS — E559 Vitamin D deficiency, unspecified: Secondary | ICD-10-CM

## 2023-04-21 DIAGNOSIS — E538 Deficiency of other specified B group vitamins: Secondary | ICD-10-CM | POA: Diagnosis not present

## 2023-04-21 DIAGNOSIS — E785 Hyperlipidemia, unspecified: Secondary | ICD-10-CM

## 2023-04-21 LAB — CBC WITH DIFFERENTIAL/PLATELET
Basophils Absolute: 0.1 10*3/uL (ref 0.0–0.1)
Basophils Relative: 0.8 % (ref 0.0–3.0)
Eosinophils Absolute: 0.2 10*3/uL (ref 0.0–0.7)
Eosinophils Relative: 2.3 % (ref 0.0–5.0)
HCT: 38.3 % (ref 36.0–46.0)
Hemoglobin: 12.3 g/dL (ref 12.0–15.0)
Lymphocytes Relative: 31.1 % (ref 12.0–46.0)
Lymphs Abs: 2.2 10*3/uL (ref 0.7–4.0)
MCHC: 32.2 g/dL (ref 30.0–36.0)
MCV: 86.3 fl (ref 78.0–100.0)
Monocytes Absolute: 0.7 10*3/uL (ref 0.1–1.0)
Monocytes Relative: 9.5 % (ref 3.0–12.0)
Neutro Abs: 4 10*3/uL (ref 1.4–7.7)
Neutrophils Relative %: 56.3 % (ref 43.0–77.0)
Platelets: 328 10*3/uL (ref 150.0–400.0)
RBC: 4.44 Mil/uL (ref 3.87–5.11)
RDW: 13.7 % (ref 11.5–15.5)
WBC: 7.1 10*3/uL (ref 4.0–10.5)

## 2023-04-21 LAB — HEMOGLOBIN A1C: Hgb A1c MFr Bld: 6 % (ref 4.6–6.5)

## 2023-04-21 LAB — HEPATIC FUNCTION PANEL
ALT: 14 U/L (ref 0–35)
AST: 18 U/L (ref 0–37)
Albumin: 4.1 g/dL (ref 3.5–5.2)
Alkaline Phosphatase: 69 U/L (ref 39–117)
Bilirubin, Direct: 0.1 mg/dL (ref 0.0–0.3)
Total Bilirubin: 0.4 mg/dL (ref 0.2–1.2)
Total Protein: 7 g/dL (ref 6.0–8.3)

## 2023-04-21 LAB — BASIC METABOLIC PANEL
BUN: 28 mg/dL — ABNORMAL HIGH (ref 6–23)
CO2: 28 mEq/L (ref 19–32)
Calcium: 9.2 mg/dL (ref 8.4–10.5)
Chloride: 104 mEq/L (ref 96–112)
Creatinine, Ser: 2.1 mg/dL — ABNORMAL HIGH (ref 0.40–1.20)
GFR: 23.22 mL/min — ABNORMAL LOW (ref >60.00)
Glucose, Bld: 106 mg/dL — ABNORMAL HIGH (ref 70–99)
Potassium: 4.5 mEq/L (ref 3.5–5.1)
Sodium: 137 mEq/L (ref 135–145)

## 2023-04-21 LAB — VITAMIN D 25 HYDROXY (VIT D DEFICIENCY, FRACTURES): VITD: 73.8 ng/mL (ref 30.00–100.00)

## 2023-04-21 LAB — LIPID PANEL
Cholesterol: 225 mg/dL — ABNORMAL HIGH (ref 0–200)
HDL: 75.8 mg/dL (ref >39.00)
LDL Cholesterol: 124 mg/dL — ABNORMAL HIGH (ref 0–99)
NonHDL: 149.53
Total CHOL/HDL Ratio: 3
Triglycerides: 126 mg/dL (ref 0.0–149.0)
VLDL: 25.2 mg/dL (ref 0.0–40.0)

## 2023-04-21 LAB — URINALYSIS, ROUTINE W REFLEX MICROSCOPIC
Bilirubin Urine: NEGATIVE
Ketones, ur: NEGATIVE
Nitrite: NEGATIVE
Specific Gravity, Urine: 1.02 (ref 1.000–1.030)
Total Protein, Urine: 30 — AB
Urine Glucose: NEGATIVE
Urobilinogen, UA: 0.2 (ref 0.0–1.0)
pH: 6 (ref 5.0–8.0)

## 2023-04-21 LAB — TSH: TSH: 1.14 u[IU]/mL (ref 0.35–5.50)

## 2023-04-21 LAB — VITAMIN B12: Vitamin B-12: 172 pg/mL — ABNORMAL LOW (ref 211–911)

## 2023-04-21 MED ORDER — ROSUVASTATIN CALCIUM 10 MG PO TABS
10.0000 mg | ORAL_TABLET | Freq: Every day | ORAL | 3 refills | Status: DC
  Filled 2023-04-21 – 2023-06-02 (×2): qty 90, 90d supply, fill #0

## 2023-04-21 MED ORDER — VITAMIN B-12 1000 MCG PO TABS
1000.0000 ug | ORAL_TABLET | Freq: Every day | ORAL | 3 refills | Status: DC
  Filled 2023-04-21: qty 90, 90d supply, fill #0

## 2023-04-22 ENCOUNTER — Other Ambulatory Visit (HOSPITAL_COMMUNITY): Payer: Self-pay

## 2023-04-26 NOTE — Progress Notes (Signed)
Going straight to voicemail.Marland Kitchen will try again around noon.

## 2023-05-01 ENCOUNTER — Telehealth: Payer: Self-pay | Admitting: Internal Medicine

## 2023-05-01 ENCOUNTER — Other Ambulatory Visit (HOSPITAL_COMMUNITY): Payer: Self-pay

## 2023-05-01 NOTE — Telephone Encounter (Signed)
Patient returning call from 04/26/2023 about B12. She would like a call back at [phone]. Patient prefers after 3:30 pm, if possible.

## 2023-05-04 NOTE — Telephone Encounter (Signed)
LVM for pt to call back.

## 2023-06-02 ENCOUNTER — Other Ambulatory Visit: Payer: Self-pay | Admitting: Internal Medicine

## 2023-06-02 ENCOUNTER — Other Ambulatory Visit (HOSPITAL_COMMUNITY): Payer: Self-pay

## 2023-06-02 DIAGNOSIS — R109 Unspecified abdominal pain: Secondary | ICD-10-CM | POA: Diagnosis not present

## 2023-06-02 DIAGNOSIS — N200 Calculus of kidney: Secondary | ICD-10-CM | POA: Diagnosis not present

## 2023-06-02 MED ORDER — AMPHETAMINE-DEXTROAMPHETAMINE 30 MG PO TABS
30.0000 mg | ORAL_TABLET | Freq: Two times a day (BID) | ORAL | 0 refills | Status: DC
  Filled 2023-06-02: qty 60, 30d supply, fill #0

## 2023-06-07 ENCOUNTER — Other Ambulatory Visit (HOSPITAL_COMMUNITY): Payer: Self-pay

## 2023-06-09 ENCOUNTER — Other Ambulatory Visit (HOSPITAL_COMMUNITY): Payer: Self-pay

## 2023-07-18 ENCOUNTER — Other Ambulatory Visit: Payer: Self-pay | Admitting: Internal Medicine

## 2023-07-18 ENCOUNTER — Other Ambulatory Visit (HOSPITAL_COMMUNITY): Payer: Self-pay

## 2023-07-18 MED ORDER — AMPHETAMINE-DEXTROAMPHETAMINE 30 MG PO TABS
30.0000 mg | ORAL_TABLET | Freq: Two times a day (BID) | ORAL | 0 refills | Status: DC
  Filled 2023-07-18: qty 60, 30d supply, fill #0

## 2023-07-19 ENCOUNTER — Other Ambulatory Visit (HOSPITAL_COMMUNITY): Payer: Self-pay

## 2023-07-20 ENCOUNTER — Other Ambulatory Visit (HOSPITAL_COMMUNITY): Payer: Self-pay

## 2023-08-31 ENCOUNTER — Other Ambulatory Visit: Payer: Self-pay | Admitting: Internal Medicine

## 2023-08-31 ENCOUNTER — Other Ambulatory Visit (HOSPITAL_COMMUNITY): Payer: Self-pay

## 2023-08-31 ENCOUNTER — Other Ambulatory Visit: Payer: Self-pay

## 2023-08-31 MED ORDER — AMPHETAMINE-DEXTROAMPHETAMINE 30 MG PO TABS
30.0000 mg | ORAL_TABLET | Freq: Two times a day (BID) | ORAL | 0 refills | Status: DC
  Filled 2023-08-31: qty 60, 30d supply, fill #0

## 2023-09-01 ENCOUNTER — Other Ambulatory Visit (HOSPITAL_COMMUNITY): Payer: Self-pay

## 2023-10-06 ENCOUNTER — Other Ambulatory Visit: Payer: Self-pay

## 2023-10-06 ENCOUNTER — Other Ambulatory Visit (HOSPITAL_COMMUNITY): Payer: Self-pay

## 2023-10-06 ENCOUNTER — Other Ambulatory Visit: Payer: Self-pay | Admitting: Internal Medicine

## 2023-10-06 MED ORDER — AMPHETAMINE-DEXTROAMPHETAMINE 30 MG PO TABS
30.0000 mg | ORAL_TABLET | Freq: Two times a day (BID) | ORAL | 0 refills | Status: DC
  Filled 2023-10-06: qty 60, 30d supply, fill #0

## 2023-10-07 ENCOUNTER — Other Ambulatory Visit (HOSPITAL_COMMUNITY): Payer: Self-pay

## 2023-10-10 ENCOUNTER — Emergency Department (HOSPITAL_COMMUNITY): Payer: Medicare Other

## 2023-10-10 ENCOUNTER — Inpatient Hospital Stay (HOSPITAL_COMMUNITY): Payer: Medicare Other

## 2023-10-10 ENCOUNTER — Inpatient Hospital Stay (HOSPITAL_COMMUNITY)
Admission: EM | Admit: 2023-10-10 | Discharge: 2023-10-12 | DRG: 065 | Disposition: A | Discharge status: Rehabilitation Facility | Payer: Medicare Other | Attending: Neurology | Admitting: Neurology

## 2023-10-10 DIAGNOSIS — N179 Acute kidney failure, unspecified: Secondary | ICD-10-CM | POA: Diagnosis not present

## 2023-10-10 DIAGNOSIS — I129 Hypertensive chronic kidney disease with stage 1 through stage 4 chronic kidney disease, or unspecified chronic kidney disease: Secondary | ICD-10-CM | POA: Diagnosis present

## 2023-10-10 DIAGNOSIS — N1832 Chronic kidney disease, stage 3b: Secondary | ICD-10-CM | POA: Diagnosis present

## 2023-10-10 DIAGNOSIS — I161 Hypertensive emergency: Secondary | ICD-10-CM

## 2023-10-10 DIAGNOSIS — R29707 NIHSS score 7: Secondary | ICD-10-CM | POA: Diagnosis not present

## 2023-10-10 DIAGNOSIS — F1011 Alcohol abuse, in remission: Secondary | ICD-10-CM | POA: Diagnosis present

## 2023-10-10 DIAGNOSIS — E785 Hyperlipidemia, unspecified: Secondary | ICD-10-CM | POA: Diagnosis present

## 2023-10-10 DIAGNOSIS — G8194 Hemiplegia, unspecified affecting left nondominant side: Secondary | ICD-10-CM | POA: Diagnosis present

## 2023-10-10 DIAGNOSIS — T465X6A Underdosing of other antihypertensive drugs, initial encounter: Secondary | ICD-10-CM | POA: Diagnosis present

## 2023-10-10 DIAGNOSIS — I69328 Other speech and language deficits following cerebral infarction: Secondary | ICD-10-CM | POA: Diagnosis not present

## 2023-10-10 DIAGNOSIS — Z7951 Long term (current) use of inhaled steroids: Secondary | ICD-10-CM | POA: Diagnosis not present

## 2023-10-10 DIAGNOSIS — M25572 Pain in left ankle and joints of left foot: Secondary | ICD-10-CM | POA: Diagnosis present

## 2023-10-10 DIAGNOSIS — Y900 Blood alcohol level of less than 20 mg/100 ml: Secondary | ICD-10-CM | POA: Diagnosis present

## 2023-10-10 DIAGNOSIS — K5901 Slow transit constipation: Secondary | ICD-10-CM | POA: Diagnosis not present

## 2023-10-10 DIAGNOSIS — S93402D Sprain of unspecified ligament of left ankle, subsequent encounter: Secondary | ICD-10-CM | POA: Diagnosis not present

## 2023-10-10 DIAGNOSIS — R531 Weakness: Secondary | ICD-10-CM | POA: Diagnosis not present

## 2023-10-10 DIAGNOSIS — R58 Hemorrhage, not elsewhere classified: Secondary | ICD-10-CM | POA: Diagnosis not present

## 2023-10-10 DIAGNOSIS — Z818 Family history of other mental and behavioral disorders: Secondary | ICD-10-CM

## 2023-10-10 DIAGNOSIS — Z87442 Personal history of urinary calculi: Secondary | ICD-10-CM

## 2023-10-10 DIAGNOSIS — I610 Nontraumatic intracerebral hemorrhage in hemisphere, subcortical: Principal | ICD-10-CM | POA: Diagnosis present

## 2023-10-10 DIAGNOSIS — Z823 Family history of stroke: Secondary | ICD-10-CM | POA: Diagnosis not present

## 2023-10-10 DIAGNOSIS — J449 Chronic obstructive pulmonary disease, unspecified: Secondary | ICD-10-CM | POA: Diagnosis not present

## 2023-10-10 DIAGNOSIS — F32A Depression, unspecified: Secondary | ICD-10-CM | POA: Diagnosis present

## 2023-10-10 DIAGNOSIS — Z79899 Other long term (current) drug therapy: Secondary | ICD-10-CM

## 2023-10-10 DIAGNOSIS — Z8249 Family history of ischemic heart disease and other diseases of the circulatory system: Secondary | ICD-10-CM | POA: Diagnosis not present

## 2023-10-10 DIAGNOSIS — N189 Chronic kidney disease, unspecified: Secondary | ICD-10-CM | POA: Diagnosis present

## 2023-10-10 DIAGNOSIS — F17200 Nicotine dependence, unspecified, uncomplicated: Secondary | ICD-10-CM | POA: Diagnosis present

## 2023-10-10 DIAGNOSIS — I611 Nontraumatic intracerebral hemorrhage in hemisphere, cortical: Secondary | ICD-10-CM | POA: Diagnosis not present

## 2023-10-10 DIAGNOSIS — R29704 NIHSS score 4: Secondary | ICD-10-CM | POA: Diagnosis not present

## 2023-10-10 DIAGNOSIS — S93402A Sprain of unspecified ligament of left ankle, initial encounter: Secondary | ICD-10-CM | POA: Diagnosis not present

## 2023-10-10 DIAGNOSIS — F17210 Nicotine dependence, cigarettes, uncomplicated: Secondary | ICD-10-CM | POA: Diagnosis not present

## 2023-10-10 DIAGNOSIS — Z961 Presence of intraocular lens: Secondary | ICD-10-CM | POA: Diagnosis present

## 2023-10-10 DIAGNOSIS — F411 Generalized anxiety disorder: Secondary | ICD-10-CM | POA: Diagnosis not present

## 2023-10-10 DIAGNOSIS — Z885 Allergy status to narcotic agent status: Secondary | ICD-10-CM

## 2023-10-10 DIAGNOSIS — R29705 NIHSS score 5: Secondary | ICD-10-CM | POA: Diagnosis not present

## 2023-10-10 DIAGNOSIS — R29706 NIHSS score 6: Secondary | ICD-10-CM | POA: Diagnosis present

## 2023-10-10 DIAGNOSIS — R29818 Other symptoms and signs involving the nervous system: Secondary | ICD-10-CM | POA: Diagnosis not present

## 2023-10-10 DIAGNOSIS — I639 Cerebral infarction, unspecified: Secondary | ICD-10-CM | POA: Diagnosis not present

## 2023-10-10 DIAGNOSIS — Z91148 Patient's other noncompliance with medication regimen for other reason: Secondary | ICD-10-CM | POA: Diagnosis not present

## 2023-10-10 DIAGNOSIS — I6389 Other cerebral infarction: Secondary | ICD-10-CM

## 2023-10-10 DIAGNOSIS — G4700 Insomnia, unspecified: Secondary | ICD-10-CM | POA: Diagnosis not present

## 2023-10-10 DIAGNOSIS — Z90710 Acquired absence of both cervix and uterus: Secondary | ICD-10-CM

## 2023-10-10 DIAGNOSIS — Z87440 Personal history of urinary (tract) infections: Secondary | ICD-10-CM

## 2023-10-10 DIAGNOSIS — W19XXXA Unspecified fall, initial encounter: Secondary | ICD-10-CM | POA: Diagnosis not present

## 2023-10-10 DIAGNOSIS — Z9841 Cataract extraction status, right eye: Secondary | ICD-10-CM

## 2023-10-10 DIAGNOSIS — F419 Anxiety disorder, unspecified: Secondary | ICD-10-CM | POA: Diagnosis present

## 2023-10-10 DIAGNOSIS — Z881 Allergy status to other antibiotic agents status: Secondary | ICD-10-CM

## 2023-10-10 DIAGNOSIS — Z9842 Cataract extraction status, left eye: Secondary | ICD-10-CM

## 2023-10-10 DIAGNOSIS — I619 Nontraumatic intracerebral hemorrhage, unspecified: Secondary | ICD-10-CM | POA: Diagnosis not present

## 2023-10-10 DIAGNOSIS — E860 Dehydration: Secondary | ICD-10-CM | POA: Diagnosis not present

## 2023-10-10 DIAGNOSIS — X501XXA Overexertion from prolonged static or awkward postures, initial encounter: Secondary | ICD-10-CM | POA: Diagnosis not present

## 2023-10-10 DIAGNOSIS — R270 Ataxia, unspecified: Secondary | ICD-10-CM | POA: Diagnosis present

## 2023-10-10 DIAGNOSIS — I10 Essential (primary) hypertension: Secondary | ICD-10-CM | POA: Diagnosis not present

## 2023-10-10 DIAGNOSIS — G8929 Other chronic pain: Secondary | ICD-10-CM | POA: Diagnosis present

## 2023-10-10 DIAGNOSIS — I69154 Hemiplegia and hemiparesis following nontraumatic intracerebral hemorrhage affecting left non-dominant side: Secondary | ICD-10-CM | POA: Diagnosis not present

## 2023-10-10 DIAGNOSIS — Z743 Need for continuous supervision: Secondary | ICD-10-CM | POA: Diagnosis not present

## 2023-10-10 DIAGNOSIS — R29703 NIHSS score 3: Secondary | ICD-10-CM | POA: Diagnosis not present

## 2023-10-10 DIAGNOSIS — R471 Dysarthria and anarthria: Secondary | ICD-10-CM | POA: Diagnosis present

## 2023-10-10 DIAGNOSIS — E559 Vitamin D deficiency, unspecified: Secondary | ICD-10-CM | POA: Diagnosis present

## 2023-10-10 DIAGNOSIS — I6782 Cerebral ischemia: Secondary | ICD-10-CM | POA: Diagnosis not present

## 2023-10-10 DIAGNOSIS — J309 Allergic rhinitis, unspecified: Secondary | ICD-10-CM | POA: Diagnosis present

## 2023-10-10 DIAGNOSIS — Z9049 Acquired absence of other specified parts of digestive tract: Secondary | ICD-10-CM | POA: Diagnosis not present

## 2023-10-10 DIAGNOSIS — E039 Hypothyroidism, unspecified: Secondary | ICD-10-CM | POA: Diagnosis not present

## 2023-10-10 DIAGNOSIS — K219 Gastro-esophageal reflux disease without esophagitis: Secondary | ICD-10-CM | POA: Diagnosis present

## 2023-10-10 DIAGNOSIS — R6889 Other general symptoms and signs: Secondary | ICD-10-CM | POA: Diagnosis not present

## 2023-10-10 DIAGNOSIS — K5900 Constipation, unspecified: Secondary | ICD-10-CM | POA: Diagnosis not present

## 2023-10-10 LAB — COMPREHENSIVE METABOLIC PANEL
ALT: 18 U/L (ref 0–44)
AST: 24 U/L (ref 15–41)
Albumin: 3.6 g/dL (ref 3.5–5.0)
Alkaline Phosphatase: 68 U/L (ref 38–126)
Anion gap: 6 (ref 5–15)
BUN: 23 mg/dL (ref 8–23)
CO2: 24 mmol/L (ref 22–32)
Calcium: 9.2 mg/dL (ref 8.9–10.3)
Chloride: 106 mmol/L (ref 98–111)
Creatinine, Ser: 1.53 mg/dL — ABNORMAL HIGH (ref 0.44–1.00)
GFR, Estimated: 36 mL/min — ABNORMAL LOW (ref >60)
Glucose, Bld: 108 mg/dL — ABNORMAL HIGH (ref 70–99)
Potassium: 4.1 mmol/L (ref 3.5–5.1)
Sodium: 136 mmol/L (ref 135–145)
Total Bilirubin: 0.5 mg/dL (ref <1.2)
Total Protein: 6.7 g/dL (ref 6.5–8.1)

## 2023-10-10 LAB — URINALYSIS, ROUTINE W REFLEX MICROSCOPIC
Bacteria, UA: NONE SEEN
Bilirubin Urine: NEGATIVE
Glucose, UA: NEGATIVE mg/dL
Ketones, ur: NEGATIVE mg/dL
Nitrite: NEGATIVE
Protein, ur: NEGATIVE mg/dL
Specific Gravity, Urine: 1.027 (ref 1.005–1.030)
WBC, UA: >50 WBC/hpf (ref 0–5)
pH: 6 (ref 5.0–8.0)

## 2023-10-10 LAB — CBC
HCT: 38.6 % (ref 36.0–46.0)
Hemoglobin: 12.5 g/dL (ref 12.0–15.0)
MCH: 29.4 pg (ref 26.0–34.0)
MCHC: 32.4 g/dL (ref 30.0–36.0)
MCV: 90.8 fL (ref 80.0–100.0)
Platelets: 248 10*3/uL (ref 150–400)
RBC: 4.25 MIL/uL (ref 3.87–5.11)
RDW: 12.6 % (ref 11.5–15.5)
WBC: 6.2 10*3/uL (ref 4.0–10.5)
nRBC: 0 % (ref 0.0–0.2)

## 2023-10-10 LAB — I-STAT CHEM 8, ED
BUN: 25 mg/dL — ABNORMAL HIGH (ref 8–23)
Calcium, Ion: 1.14 mmol/L — ABNORMAL LOW (ref 1.15–1.40)
Chloride: 108 mmol/L (ref 98–111)
Creatinine, Ser: 1.7 mg/dL — ABNORMAL HIGH (ref 0.44–1.00)
Glucose, Bld: 105 mg/dL — ABNORMAL HIGH (ref 70–99)
HCT: 37 % (ref 36.0–46.0)
Hemoglobin: 12.6 g/dL (ref 12.0–15.0)
Potassium: 4.2 mmol/L (ref 3.5–5.1)
Sodium: 138 mmol/L (ref 135–145)
TCO2: 21 mmol/L — ABNORMAL LOW (ref 22–32)

## 2023-10-10 LAB — ECHOCARDIOGRAM COMPLETE
AR max vel: 2.24 cm2
AV Area VTI: 2.42 cm2
AV Area mean vel: 2.31 cm2
AV Mean grad: 3 mm[Hg]
AV Peak grad: 5.6 mm[Hg]
Ao pk vel: 1.18 m/s
Area-P 1/2: 3.17 cm2
S' Lateral: 2.7 cm
Weight: 1961.21 [oz_av]

## 2023-10-10 LAB — DIFFERENTIAL
Abs Immature Granulocytes: 0.01 10*3/uL (ref 0.00–0.07)
Basophils Absolute: 0.1 10*3/uL (ref 0.0–0.1)
Basophils Relative: 1 %
Eosinophils Absolute: 0.3 10*3/uL (ref 0.0–0.5)
Eosinophils Relative: 4 %
Immature Granulocytes: 0 %
Lymphocytes Relative: 24 %
Lymphs Abs: 1.5 10*3/uL (ref 0.7–4.0)
Monocytes Absolute: 0.6 10*3/uL (ref 0.1–1.0)
Monocytes Relative: 9 %
Neutro Abs: 3.8 10*3/uL (ref 1.7–7.7)
Neutrophils Relative %: 62 %

## 2023-10-10 LAB — RAPID URINE DRUG SCREEN, HOSP PERFORMED
Amphetamines: POSITIVE — AB
Barbiturates: NOT DETECTED
Benzodiazepines: POSITIVE — AB
Cocaine: NOT DETECTED
Opiates: NOT DETECTED
Tetrahydrocannabinol: POSITIVE — AB

## 2023-10-10 LAB — ETHANOL: Alcohol, Ethyl (B): <10 mg/dL (ref <10)

## 2023-10-10 LAB — APTT: aPTT: 28 s (ref 24–36)

## 2023-10-10 LAB — PROTIME-INR
INR: 0.9 (ref 0.8–1.2)
Prothrombin Time: 12.7 s (ref 11.4–15.2)

## 2023-10-10 LAB — MRSA NEXT GEN BY PCR, NASAL: MRSA by PCR Next Gen: NOT DETECTED

## 2023-10-10 LAB — CBG MONITORING, ED: Glucose-Capillary: 107 mg/dL — ABNORMAL HIGH (ref 70–99)

## 2023-10-10 MED ORDER — ONDANSETRON HCL 4 MG/2ML IJ SOLN
4.0000 mg | Freq: Once | INTRAMUSCULAR | Status: AC
  Administered 2023-10-10: 4 mg via INTRAVENOUS
  Filled 2023-10-10: qty 2

## 2023-10-10 MED ORDER — LORAZEPAM 2 MG/ML IJ SOLN: 1.0000 mg | Freq: Once | INTRAMUSCULAR | Status: DC

## 2023-10-10 MED ORDER — STROKE: EARLY STAGES OF RECOVERY BOOK
Freq: Once | Status: AC
  Filled 2023-10-10: qty 1

## 2023-10-10 MED ORDER — SENNOSIDES-DOCUSATE SODIUM 8.6-50 MG PO TABS
1.0000 | ORAL_TABLET | Freq: Two times a day (BID) | ORAL | Status: DC
Start: 2023-10-10 — End: 2023-10-12
  Administered 2023-10-10 – 2023-10-12 (×4): 1 via ORAL
  Filled 2023-10-10 (×4): qty 1

## 2023-10-10 MED ORDER — ALPRAZOLAM 0.5 MG PO TABS
0.5000 mg | ORAL_TABLET | Freq: Once | ORAL | Status: AC
  Administered 2023-10-10: 0.5 mg via ORAL
  Filled 2023-10-10: qty 1

## 2023-10-10 MED ORDER — ACETAMINOPHEN 650 MG RE SUPP: 650.0000 mg | RECTAL | Status: DC | PRN

## 2023-10-10 MED ORDER — PANTOPRAZOLE SODIUM 40 MG IV SOLR
40.0000 mg | Freq: Every day | INTRAVENOUS | Status: DC
Start: 2023-10-10 — End: 2023-10-11
  Administered 2023-10-10: 40 mg via INTRAVENOUS
  Filled 2023-10-10: qty 10

## 2023-10-10 MED ORDER — CLEVIDIPINE BUTYRATE 0.5 MG/ML IV EMUL
INTRAVENOUS | Status: AC
  Administered 2023-10-10: 2 mg/h via INTRAVENOUS
  Filled 2023-10-10: qty 100

## 2023-10-10 MED ORDER — LORAZEPAM 2 MG/ML IJ SOLN
INTRAMUSCULAR | Status: AC
  Filled 2023-10-10: qty 1

## 2023-10-10 MED ORDER — ACETAMINOPHEN 325 MG PO TABS
650.0000 mg | ORAL_TABLET | ORAL | Status: DC | PRN
  Administered 2023-10-11: 650 mg via ORAL
  Filled 2023-10-10: qty 2

## 2023-10-10 MED ORDER — SODIUM CHLORIDE 0.9% FLUSH
3.0000 mL | Freq: Once | INTRAVENOUS | Status: AC
  Administered 2023-10-10: 3 mL via INTRAVENOUS

## 2023-10-10 MED ORDER — IOHEXOL 350 MG/ML SOLN
75.0000 mL | Freq: Once | INTRAVENOUS | Status: AC | PRN
  Administered 2023-10-10: 75 mL via INTRAVENOUS

## 2023-10-10 MED ORDER — ORAL CARE MOUTH RINSE: 15.0000 mL | OROMUCOSAL | Status: DC | PRN

## 2023-10-10 MED ORDER — OXYCODONE-ACETAMINOPHEN 5-325 MG PO TABS
1.0000 | ORAL_TABLET | Freq: Once | ORAL | Status: AC | PRN
  Administered 2023-10-10: 1 via ORAL
  Filled 2023-10-10: qty 1

## 2023-10-10 MED ORDER — CLEVIDIPINE BUTYRATE 0.5 MG/ML IV EMUL
0.0000 mg/h | INTRAVENOUS | Status: DC
  Administered 2023-10-10: 8 mg/h via INTRAVENOUS
  Administered 2023-10-10: 10 mg/h via INTRAVENOUS
  Administered 2023-10-10 – 2023-10-11 (×2): 4 mg/h via INTRAVENOUS
  Administered 2023-10-11: 12 mg/h via INTRAVENOUS
  Filled 2023-10-10 (×5): qty 50

## 2023-10-10 MED ORDER — CALCIUM CARBONATE ANTACID 500 MG PO CHEW
1.0000 | CHEWABLE_TABLET | Freq: Every day | ORAL | Status: DC
  Administered 2023-10-10 – 2023-10-12 (×2): 200 mg via ORAL
  Filled 2023-10-10 (×4): qty 1

## 2023-10-10 MED ORDER — LABETALOL HCL 5 MG/ML IV SOLN
20.0000 mg | Freq: Once | INTRAVENOUS | Status: AC
  Administered 2023-10-10: 20 mg via INTRAVENOUS

## 2023-10-10 MED ORDER — ACETAMINOPHEN 160 MG/5ML PO SOLN: 650.0000 mg | ORAL | Status: DC | PRN

## 2023-10-10 MED ORDER — LORAZEPAM 2 MG/ML IJ SOLN
1.0000 mg | Freq: Once | INTRAMUSCULAR | Status: AC
  Administered 2023-10-10: 1 mg via INTRAVENOUS

## 2023-10-10 MED ORDER — CHLORHEXIDINE GLUCONATE CLOTH 2 % EX PADS
6.0000 | MEDICATED_PAD | Freq: Every day | CUTANEOUS | Status: DC
  Administered 2023-10-10 – 2023-10-12 (×3): 6 via TOPICAL

## 2023-10-10 NOTE — Code Documentation (Signed)
Stroke Response Nurse Documentation Code Documentation  Kathy Vargas is a 72 y.o. female arriving to [facility]  via [facility] on 10/10/2023 with past medical hx of HTN, COPD, chronic pain, anxiety, DJD, headaches, ETOH in remission. On No antithrombotic. Code stroke was activated by EMS.   Patient from work where she was LKW at [phone] and now complaining of left arm/leg weakness. Per patient, she woke up at 0330 and felt fine but while driving to work this morning she noticed her left arm and leg started to feel weak. EMS noticed left arm/weakness and slurred speech but speech is slurred at baseline.   Stroke team at the bedside on patient arrival. Labs drawn and patient cleared for CT by EDP. Patient to CT with team. NIHSS 7, see documentation for details and code stroke times. Patient with left arm weakness, left leg weakness, left limb ataxia, left decreased sensation, and dysarthria  on exam. The following imaging was completed:  CT Head and CTA. Patient is not a candidate for IV Thrombolytic due to ICH per MD. Patient is not a candidate for IR due to no LVO noted on imaging per MD.  Care Plan: VS/NIHSS/Pupil Checks q1hour; SBP Goal 130-150  Bedside handoff with ED RN Josh.    Felecia Jan  Stroke Response RN

## 2023-10-10 NOTE — Progress Notes (Signed)
   10/10/23 1423  Spiritual Encounters  Type of Visit Attempt (pt unavailable)  Reason for visit Routine spiritual support  OnCall Visit No   Patient not alert at time if visit.

## 2023-10-10 NOTE — Progress Notes (Signed)
*  PRELIMINARY RESULTS* Echocardiogram 2D Echocardiogram has been performed.  Kathy Vargas 10/10/2023, 3:20 PM

## 2023-10-10 NOTE — Plan of Care (Signed)
  Problem: Coping: Goal: Will verbalize positive feelings about self Outcome: Progressing Goal: Will identify appropriate support needs Outcome: Progressing   Problem: Self-Care: Goal: Ability to communicate needs accurately will improve Outcome: Progressing

## 2023-10-10 NOTE — Progress Notes (Signed)
SLP Cancellation Note  Patient Details Name: Panzie Booras MRN: [id number] DOB: 21-May-1951   Cancelled treatment:       Reason Eval/Treat Not Completed: Pt's daughter requested SLP return at a later time to let pt rest. Per family, pt had just fallen asleep and they would prefer she not be roused. SLP will continue following.    Gwynneth Aliment, M.A., CF-SLP Speech Language Pathology, Acute Rehabilitation Services  Secure Chat preferred [phone]  10/10/2023, 3:46 PM

## 2023-10-10 NOTE — Progress Notes (Signed)
OT Cancellation Note  Patient Details Name: Kathy Vargas MRN: [id number] DOB: 04-24-1951   Cancelled Treatment:    Reason Eval/Treat Not Completed: Patient not medically ready;Active bedrest order  Mateo Flow 10/10/2023, 1:28 PM

## 2023-10-10 NOTE — ED Provider Notes (Signed)
[facility] AT [facility] Provider Note   CSN: [id number] Arrival date & time: 10/10/23  3016     History  No chief complaint on file.   Kathy Vargas is a 72 y.o. female.  HPI Patient presents for strokelike symptoms.  Medical history includes anxiety, COPD, arthritis, GERD, depression.  She woke up this morning in her normal state of health.  She typically gets up early, around 3:30 AM.  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  She reports that this was at 6:30 AM.  EMS noted some mildly slurred speech which the patient reports is baseline for her.  Vital signs prior to arrival notable for hypertension in the range of 220/120.  CBG was normal.    Home Medications Prior to Admission medications   Medication Sig Start Date End Date Taking? Authorizing Provider  albuterol (VENTOLIN HFA) 108 (90 Base) MCG/ACT inhaler Inhale 2 puffs into the lungs every 6 (six) hours as needed for wheezing or shortness of breath. 08/18/20   Corwin Levins, MD  ALPRAZolam Prudy Feeler) 0.5 MG tablet Take 1 tablet (0.5 mg) by mouth 2 times daily as needed. 04/14/23   Corwin Levins, MD  amphetamine-dextroamphetamine (ADDERALL) 30 MG tablet Take 1 tablet by mouth 2 (two) times daily. 10/06/23   Corwin Levins, MD  Ascorbic Acid (VITAMIN C PO) Take 1 tablet by mouth in the morning.    [provider]  Biotin w/ Vitamins C & E (HAIR/SKIN/NAILS PO) Take 1 tablet by mouth daily.    [provider]  Calcium-Magnesium-Zinc (CAL-MAG-ZINC PO) Take 1 tablet by mouth at bedtime.    [provider]  cetirizine (ZYRTEC) 10 MG tablet Take 1 tablet (10 mg total) by mouth daily as needed for allergies. 10/10/22   Corwin Levins, MD  Cholecalciferol (VITAMIN D3 PO) Take 1 tablet by mouth in the morning.    [provider]  cyanocobalamin (VITAMIN B12) 1000 MCG tablet Take 1 tablet (1,000 mcg total) by mouth daily. 04/21/23    Corwin Levins, MD  diclofenac (VOLTAREN) 75 MG EC tablet Take 1 tablet (75 mg total) by mouth 2 (two) times daily as needed. 10/10/22   Corwin Levins, MD  docusate sodium (COLACE) 100 MG capsule Take 1 capsule (100 mg total) by mouth daily as needed for up to 30 doses. 07/01/22   Jannifer Hick, MD  fluticasone (CUTIVATE) 0.005 % ointment Apply to the skin 2 times daily if needed 01/11/22     fluticasone-salmeterol (ADVAIR DISKUS) 250-50 MCG/ACT AEPB Inhale 1 puff into the lungs 2 (two) times daily as needed (Asthma). 12/08/22   Corwin Levins, MD  hydrALAZINE (APRESOLINE) 50 MG tablet Take 1 tablet (50 mg total) by mouth 3 (three) times daily. 06/30/22   Corwin Levins, MD  irbesartan (AVAPRO) 150 MG tablet Take 1 tablet (150 mg total) by mouth daily. 12/08/22   Corwin Levins, MD  iron polysaccharides (NU-IRON) 150 MG capsule Take 1 capsule (150 mg total) by mouth daily. 01/07/22   Corwin Levins, MD  nystatin cream (MYCOSTATIN) Apply to lips once a day 01/11/22     pantoprazole (PROTONIX) 40 MG tablet Take 1 tablet (40 mg total) by mouth daily. 10/10/22   Corwin Levins, MD  rosuvastatin (CRESTOR) 10 MG tablet Take 1 tablet (10 mg total) by mouth daily. 04/21/23   Corwin Levins, MD  FLUoxetine (PROZAC) 20 MG  capsule Take 1 capsule (20 mg total) by mouth daily. 09/07/11 02/09/12  Corwin Levins, MD      Allergies    Morphine and Ceftin [cefuroxime]    Review of Systems   Review of Systems  Neurological:  Positive for weakness.  All other systems reviewed and are negative.   Physical Exam Updated Vital Signs BP 132/70   Pulse 63   Resp (!) 23   Wt 55.6 kg   SpO2 97%   BMI 20.40 kg/m  Physical Exam Vitals and nursing note reviewed.  Constitutional:      General: She is not in acute distress.    Appearance: Normal appearance. She is well-developed. She is not ill-appearing, toxic-appearing or diaphoretic.  HENT:     Head: Normocephalic and atraumatic.     Right Ear: External ear normal.      Left Ear: External ear normal.     Nose: Nose normal.     Mouth/Throat:     Mouth: Mucous membranes are moist.  Eyes:     Extraocular Movements: Extraocular movements intact.     Conjunctiva/sclera: Conjunctivae normal.  Cardiovascular:     Rate and Rhythm: Normal rate and regular rhythm.  Pulmonary:     Effort: Pulmonary effort is normal. No respiratory distress.  Abdominal:     General: Abdomen is flat. There is no distension.  Musculoskeletal:        General: No swelling or deformity.     Cervical back: Normal range of motion and neck supple.  Skin:    General: Skin is warm and dry.     Capillary Refill: Capillary refill takes less than 2 seconds.     Coloration: Skin is not jaundiced or pale.  Neurological:     Mental Status: She is alert.     Sensory: Sensory deficit present.     Motor: Weakness and pronator drift present.     Coordination: Coordination abnormal.  Psychiatric:        Mood and Affect: Mood normal.        Behavior: Behavior normal.     ED Results / Procedures / Treatments   Labs (all labs ordered are listed, but only abnormal results are displayed) Labs Reviewed  I-STAT CHEM 8, ED - Abnormal; Notable for the following components:      Result Value   BUN 25 (*)    Creatinine, Ser 1.70 (*)    Glucose, Bld 105 (*)    Calcium, Ion 1.14 (*)    TCO2 21 (*)    All other components within normal limits  CBG MONITORING, ED - Abnormal; Notable for the following components:   Glucose-Capillary 107 (*)    All other components within normal limits  PROTIME-INR  APTT  CBC  DIFFERENTIAL  COMPREHENSIVE METABOLIC PANEL  ETHANOL    EKG None  Radiology CT ANGIO HEAD NECK W WO CM (CODE STROKE)  Result Date: 10/10/2023 CLINICAL DATA:  Neuro deficit, acute stroke suspected. left-sided weakness. EXAM: CT ANGIOGRAPHY HEAD AND NECK WITH AND WITHOUT CONTRAST TECHNIQUE: Multidetector CT imaging of the head and neck was performed using the standard protocol during  bolus administration of intravenous contrast. Multiplanar CT image reconstructions and MIPs were obtained to evaluate the vascular anatomy. Carotid stenosis measurements (when applicable) are obtained utilizing NASCET criteria, using the distal internal carotid diameter as the denominator. RADIATION DOSE REDUCTION: This exam was performed according to the departmental dose-optimization program which includes automated exposure control, adjustment of the mA and/or kV  according to patient size and/or use of iterative reconstruction technique. CONTRAST:  75mL OMNIPAQUE IOHEXOL 350 MG/ML SOLN COMPARISON:  Same day CT head FINDINGS: CTA NECK FINDINGS Aortic arch: Standard branching. Imaged portion shows no evidence of aneurysm or dissection. No significant stenosis of the major arch vessel origins. Right carotid system: No evidence of dissection, stenosis (50% or greater), or occlusion. Left carotid system: No evidence of dissection, stenosis (50% or greater), or occlusion. Vertebral arteries: Codominant. No evidence of dissection, stenosis (50% or greater), or occlusion. Skeleton: Negative. Other neck: Negative. Upper chest: Negative. Review of the MIP images confirms the above findings CTA HEAD FINDINGS Anterior circulation: No significant stenosis, proximal occlusion, aneurysm, or vascular malformation. Posterior circulation: No significant stenosis, proximal occlusion, aneurysm, or vascular malformation. Venous sinuses: As permitted by contrast timing, patent. Review of the MIP images confirms the above findings IMPRESSION: 1. No large vessel occlusion proximal hemodynamically significant stenosis. 2. No visible aneurysm or arteriovenous malformation. Electronically Signed   By: Feliberto Harts M.D.   On: 10/10/2023 08:38   CT HEAD CODE STROKE WO CONTRAST  Result Date: 10/10/2023 CLINICAL DATA:  Code stroke.  Neuro deficit, acute, stroke suspected EXAM: CT HEAD WITHOUT CONTRAST TECHNIQUE: Contiguous axial  images were obtained from the base of the skull through the vertex without intravenous contrast. RADIATION DOSE REDUCTION: This exam was performed according to the departmental dose-optimization program which includes automated exposure control, adjustment of the mA and/or kV according to patient size and/or use of iterative reconstruction technique. COMPARISON:  None Available. FINDINGS: Brain: Acute 1.4 x 1.2 x 1.7 cm (estimated volume of 1.4 mL) intraparenchymal hemorrhage centered in the posterior limb of the right internal capsule. Mild surrounding edema. No substantial mass effect. No midline shift. No hydrocephalus. No mass lesion. Vascular: No hyperdense vessel identified. Skull: No acute fracture. Sinuses/Orbits: Clear sinuses.  No acute orbital findings. Other: No mastoid effusions. ASPECTS [facility] Stroke Program Early CT Score) Total score (0-10 with 10 being normal): 10. IMPRESSION: 1. Acute 1.7 cm intraparenchymal hemorrhage centered in the posterior limb of the right internal capsule. No substantial mass effect. Findings discussed with Dr. Ezzie Dural via telephone at 8:24 AM. Electronically Signed   By: Feliberto Harts M.D.   On: 10/10/2023 08:25    Procedures Procedures    Medications Ordered in ED Medications  clevidipine (CLEVIPREX) infusion 0.5 mg/mL (8 mg/hr Intravenous Infusion Verify 10/10/23 0835)   stroke: early stages of recovery book (has no administration in time range)  acetaminophen (TYLENOL) tablet 650 mg (has no administration in time range)    Or  acetaminophen (TYLENOL) 160 MG/5ML solution 650 mg (has no administration in time range)    Or  acetaminophen (TYLENOL) suppository 650 mg (has no administration in time range)  senna-docusate (Senokot-S) tablet 1 tablet (has no administration in time range)  pantoprazole (PROTONIX) injection 40 mg (has no administration in time range)  LORazepam (ATIVAN) injection 1 mg (has no administration in time range)  sodium chloride  flush (NS) 0.9 % injection 3 mL (3 mLs Intravenous Given 10/10/23 0825)  labetalol (NORMODYNE) injection 20 mg (20 mg Intravenous Given 10/10/23 0809)  LORazepam (ATIVAN) injection 1 mg (1 mg Intravenous Given 10/10/23 0810)  iohexol (OMNIPAQUE) 350 MG/ML injection 75 mL (75 mLs Intravenous Contrast Given 10/10/23 7829)    ED Course/ Medical Decision Making/ A&P  Medical Decision Making Amount and/or Complexity of Data Reviewed Labs: ordered. Radiology: ordered.  Risk Decision regarding hospitalization.   This patient presents to the ED for concern of strokelike symptoms, this involves an extensive number of treatment options, and is a complaint that carries with it a high risk of complications and morbidity.  The differential diagnosis includes CVA, ICH, seizure, complex migraine, metabolic derangements   Co morbidities that complicate the patient evaluation  anxiety, COPD, arthritis, GERD, depression   Additional history obtained:  Additional history obtained from EMS External records from outside source obtained and reviewed including EMR   Lab Tests:  I Ordered, and personally interpreted labs.  The pertinent results include: Baseline creatinine, normal electrolytes, normal hemoglobin, no leukocytosis   Imaging Studies ordered:  I ordered imaging studies including CT head, CTA head and neck I independently visualized and interpreted imaging which showed 1.7 cm IPH centered in posterior limb of right internal capsule I agree with the radiologist interpretation   Cardiac Monitoring: / EKG:  The patient was maintained on a cardiac monitor.  I personally viewed and interpreted the cardiac monitored which showed an underlying rhythm of: Sinus rhythm   Consultations Obtained:  I requested consultation with the neurologist, Dr. Derry Lory,  and discussed lab and imaging findings as well as pertinent plan - they recommend: Blood pressure  control and admission to neuro ICU   Problem List / ED Course / Critical interventions / Medication management  Patient presenting for acute onset of strokelike symptoms, 1.5 hours prior to arrival.  Symptoms included left hemibody weakness.  On arrival, patient is alert and oriented.  She does seem to have left hemibody weakness and dysmetria.  She endorses some mild diminished sensation on left hemibody.  She arrives as a code stroke.  She was taken directly to CT scanner.  CT scan showed acute IPH.  Labetalol and Cleviprex ordered for blood pressure control.  Patient's blood pressure subsequently in the 120s over 60s.  She was admitted for further management.   Social Determinants of Health:  Lives independently  CRITICAL CARE Performed by: Gloris Manchester   Total critical care time: 31 minutes  Critical care time was exclusive of separately billable procedures and treating other patients.  Critical care was necessary to treat or prevent imminent or life-threatening deterioration.  Critical care was time spent personally by me on the following activities: development of treatment plan with patient and/or surrogate as well as nursing, discussions with consultants, evaluation of patient's response to treatment, examination of patient, obtaining history from patient or surrogate, ordering and performing treatments and interventions, ordering and review of laboratory studies, ordering and review of radiographic studies, pulse oximetry and re-evaluation of patient's condition.         Final Clinical Impression(s) / ED Diagnoses Final diagnoses:  Intraparenchymal hemorrhage of brain [facility])    Rx / DC Orders ED Discharge Orders     None         Gloris Manchester, MD 10/10/23 [phone]

## 2023-10-10 NOTE — H&P (Addendum)
NEUROLOGY H&P NOTE   Date of service: October 10, 2023 Patient Name: Kathy Vargas MRN:  [id number] DOB:  Nov 08, 1951 Chief Complaint: "CODE STROKE"  History of Present Illness  Kathy Vargas is a 72 y.o. female with past medical history significant for hypertension, chronic pain, anxiety, COPD, DJD, depression, headaches, alcohol abuse (in remission) who was brought in by EMS as a code stroke due to acute onset of left-sided weakness with a last known well of 0627.  Patient states she woke up at 330 and felt fine but then while driving to work started feeling weak in her left leg and left arm.  Patient has some slurred speech at baseline.  With EMS, patient was hypertensive. On exam at bridge, patient had left arm and leg weakness, slurred speech (states at baseline) and ataxia. Patient was taken emergently to CT head which showed a small basal ganglia ICH.  Patient was hypertensive with SBP in the 200s, given labetalol.  Patient was then also given Ativan due to anxiety before CT angio.  CT angio showed XXX.  Patient continued to be hypertensive with SBP in the 180s, Cleviprex was started at 0817. Patient was then taken to ED room 33, attempts were made to call patient's daughter and voicemail left.  Patient states she does not like to take her blood pressure medicine "due to the way it makes me feel".  Last known well: 0627 Modified rankin score: 0-Completely asymptomatic and back to baseline post- stroke ICH Score: 0 tNKASE: Not offered due to ICH Thrombectomy: not offered due to ICH NIHSS components Score: Comment  1a Level of Conscious 0[x]  1[]  2[]  3[]      1b LOC Questions 0[x]  1[]  2[]       1c LOC Commands 0[x]  1[]  2[]       2 Best Gaze 0[x]  1[]  2[]       3 Visual 0[x]  1[]  2[]  3[]      4 Facial Palsy 0[x]  1[]  2[]  3[]      5a Motor Arm - left 0[]  1[x]  2[]  3[]  4[]  UN[]    5b Motor Arm - Right 0[]  1[]  2[]  3[]  4[]  UN[]    6a Motor Leg - Left 0[]  1[]  2[x]  3[]  4[]  UN[]    6b Motor Leg - Right  0[]  1[]  2[]  3[]  4[]  UN[]    7 Limb Ataxia 0[]  1[]  2[x]  3[]  UN[]     8 Sensory 0[x]  1[]  2[]  UN[]      9 Best Language 0[]  1[]  2[]  3[]      10 Dysarthria 0[]  1[x]  2[]  UN[]      11 Extinct. and Inattention 0[x]  1[]  2[]       TOTAL:    6      ROS  Comprehensive ROS performed and pertinent positives documented in the HPI.  Past History   Past Medical History:  Diagnosis Date   ABDOMINAL TENDERNESS 01/21/2011   ACUTE GINGIVITIS NONPLAQUE INDUCED 06/11/2010   ADD 09/05/2008   Alcohol abuse, in remission 09/27/2007   ALLERGIC RHINITIS 03/02/2009   Anemia    ANXIETY 07/02/2007   BACK PAIN 05/19/2009   CHRONIC OBSTRUCTIVE PULMONARY DISEASE, ACUTE EXACERBATION 08/06/2009   COPD 07/02/2007   DEGENERATIVE JOINT DISEASE, CERVICAL SPINE 07/02/2007   DEPRESSION 07/02/2007   Dyspnea    GASTROENTERITIS, VIRAL 11/29/2010   GERD 01/21/2011   Headache(784.0) 07/02/2007   History of kidney stones    Hypertension    HYPOTHYROIDISM 07/02/2007   Lumbar disc disease    Lumbar pain with radiation down right leg 06/01/2011   OSTEOARTHRITIS 07/02/2007   Other  and unspecified ovarian cyst 05/19/2009   PAIN, CHRONIC, DUE TO TRAUMA 07/02/2007   PELVIC  PAIN 03/29/2010   UTI 05/19/2009   Vitamin D insufficiency    Past Surgical History:  Procedure Laterality Date   ABDOMINAL HYSTERECTOMY     CATARACT EXTRACTION W/ INTRAOCULAR LENS IMPLANT Bilateral    CHOLECYSTECTOMY     CYSTOSCOPY/URETEROSCOPY/HOLMIUM LASER/STENT PLACEMENT Left 07/01/2022   Procedure: CYSTOSCOPY/ RETROGRADE/URETEROSCOPY/STENT PLACEMENT;  Surgeon: Jannifer Hick, MD;  Location: [facility];  Service: Urology;  Laterality: Left;   eyebrow lift     IR URETERAL STENT LEFT NEW ACCESS W/O SEP NEPHROSTOMY CATH  07/01/2022   LIPOSUCTION     LUMBAR DISC SURGERY     L4, L5   NEPHROLITHOTOMY Left 07/01/2022   Procedure: NEPHROLITHOTOMY PERCUTANEOUS/ INTERVENTIONAL RADIOLOGY TO PLACE LEFT NEPHROSTOMY TUBE PRIOR;  Surgeon: Jannifer Hick, MD;   Location: [facility];  Service: Urology;  Laterality: Left;  ONLY NEEDS 120 MIN FOR ALL PROCEDURES   URETEROSCOPY WITH HOLMIUM LASER LITHOTRIPSY Right 04/12/2022   Procedure: URETEROSCOPY WITH HOLMIUM LASER LITHOTRIPSY, STENT PLACEMENT;  Surgeon: Despina Arias, MD;  Location: [facility];  Service: Urology;  Laterality: Right;   Family History  Problem Relation Age of Onset   Alcohol abuse Other    Anxiety disorder Other    Coronary artery disease Other    Depression Other    Stroke Other    Social History   Socioeconomic History   Marital status: Divorced    Spouse name: Not on file   Number of children: Not on file   Years of education: Not on file   Highest education level: Not on file  Occupational History   Not on file  Tobacco Use   Smoking status: Every Day    Current packs/day: 0.50    Average packs/day: 0.5 packs/day for 60.9 years (30.4 ttl pk-yrs)    Types: Cigarettes    Start date: 1964   Smokeless tobacco: Never  Vaping Use   Vaping status: Never Used  Substance and Sexual Activity   Alcohol use: Not Currently   Drug use: No   Sexual activity: Not on file  Other Topics Concern   Not on file  Social History Narrative   Not on file   Social Determinants of Health   Financial Resource Strain: Low Risk  (10/05/2022)   Overall Financial Resource Strain (CARDIA)    Difficulty of Paying Living Expenses: Not hard at all  Food Insecurity: No Food Insecurity (10/05/2022)   Hunger Vital Sign    Worried About Running Out of Food in the Last Year: Never true    Ran Out of Food in the Last Year: Never true  Transportation Needs: No Transportation Needs (10/05/2022)   PRAPARE - Administrator, Civil Service (Medical): No    Lack of Transportation (Non-Medical): No  Physical Activity: Sufficiently Active (10/05/2022)   Exercise Vital Sign    Days of Exercise per Week: 7 days    Minutes of Exercise per Session: 30 min  Stress: No Stress Concern Present  (10/05/2022)   [employer] of Occupational Health - Occupational Stress Questionnaire    Feeling of Stress : Not at all  Social Connections: Socially Isolated (10/05/2022)   Social Connection and Isolation Panel [NHANES]    Frequency of Communication with Friends and Family: More than three times a week    Frequency of Social Gatherings with Friends and Family: Three times a week    Attends Religious  Services: Never    Active Member of Clubs or Organizations: No    Attends Banker Meetings: Never    Marital Status: Never married   Allergies  Allergen Reactions   Morphine Nausea And Vomiting   Ceftin [Cefuroxime]     Nausea and vomiting    Medications  (Not in a hospital admission)    Vitals   Vitals:   10/10/23 0800  Weight: 55.6 kg     Body mass index is 20.4 kg/m.  Physical Exam   Constitutional: Appears well-developed and well-nourished.  Psych: Affect appropriate to situation.  Eyes: No scleral injection.  HENT: No OP obstruction.  Head: Normocephalic.  Cardiovascular: Normal rate and regular rhythm. Hypertensive.  Respiratory: Effort normal, non-labored breathing.  GI: Soft.  No distension. There is no tenderness.  Skin: WDI.   Neurologic Examination  Neuro: Mental Status: Patient is awake, alert, oriented to person, place, month, year, and situation. Patient is able to give a clear and coherent history. No signs of aphasia or neglect. Dysarthria present (patient states this is baseline) Cranial Nerves: II: Visual Fields are full. Pupils are equal, round, and reactive to light.   III,IV, VI: EOMI without ptosis or diploplia.  V: Facial sensation is symmetric to temperature VII: Facial movement is symmetric.  VIII: hearing is intact to voice X: Uvula elevates symmetrically XI: Shoulder shrug is symmetric. XII: tongue is midline without atrophy or fasciculations.  Motor: Tone is normal. Bulk is normal. LUE: 4/5 with mild drift LLE:  4/5 with mild drift RUE: 5/5, no drift RLE: 5/5, no drift.  Sensory: Sensation is symmetric to light touch and temperature in the arms and legs. Cerebellar: Ataxia present in left arm and leg   Labs   CBC:  Recent Labs  Lab 10/10/23 0757 10/10/23 0804  WBC 6.2  --   NEUTROABS 3.8  --   HGB 12.5 12.6  HCT 38.6 37.0  MCV 90.8  --   PLT 248  --     Basic Metabolic Panel:  Lab Results  Component Value Date   NA 138 10/10/2023   K 4.2 10/10/2023   CO2 28 04/21/2023   GLUCOSE 105 (H) 10/10/2023   BUN 25 (H) 10/10/2023   CREATININE 1.70 (H) 10/10/2023   CALCIUM 9.2 04/21/2023   GFRNONAA 23 (L) 07/02/2022   GFRAA 41 (L) 08/07/2020   Lipid Panel:  Lab Results  Component Value Date   LDLCALC 124 (H) 04/21/2023   HgbA1c:  Lab Results  Component Value Date   HGBA1C 6.0 04/21/2023   Urine Drug Screen: No results found for: "LABOPIA", "COCAINSCRNUR", "LABBENZ", "AMPHETMU", "THCU", "LABBARB"  Alcohol Level No results found for: "ETH" INR  Lab Results  Component Value Date   INR 0.9 07/01/2022   APTT No results found for: "APTT"   CT Head without contrast(Personally reviewed): Acute 1.7 cm intraparenchymal hemorrhage centered in the posterior limb of the right internal capsule.  No substantial mass effect.  CT angio Head and Neck with contrast(Personally reviewed): No large vessel occlusion proximal hemodynamically significant stenosis. No visible aneurysm or arteriovenous malformation.  MRI Brain(Personally reviewed): PENDING   Impression   Kathy Vargas is a 72 y.o. female with past medical history significant for hypertension, chronic pain, anxiety, COPD, DJD, depression, headaches, alcohol abuse (in remission) who was brought in by EMS as a code stroke due to acute onset of left-sided weakness. CTH shows subcortical ICH.   Primary Diagnosis:  Nontraumatic intracerebral hemorrhage in hemisphere, subcortical  Secondary Diagnosis: Hypertension  Emergency (SBP > 180 or DBP > 120 & end organ damage)  Recommendations   - Admit to ICU - Stability scan in 6 hours or STAT with any neurological decline - Frequent neuro checks; q41min for 1 hour, then q1hour - No antiplatelets or anticoagulants due to ICH - SCD for DVT prophylaxis, pharmacological DVT ppx at 24 hours if ICH is stable - Blood pressure control with goal systolic 130 - 150, cleverplex and labetalol PRN - Medication compliance education - Stroke labs, HgbA1c, fasting lipid panel - MRI brain with and without contrast when stabilized to evaluate for underlying mass - Risk factor modification - Echocardiogram - PT consult, OT consult, Speech consult. - Stroke team to follow  ______________________________________________________________________  NEUROHOSPITALIST ADDENDUM Performed a face to face diagnostic evaluation.   I have reviewed the contents of history and physical exam as documented by PA/ARNP/Resident and agree with above documentation.  I have discussed and formulated the above plan as documented. Edits to the note have been made as needed.  Impression/Key exam findings/Plan: small R BG ICH with no IVH with an ICH score of 0. Likely etiology of her ICH is hypertensive emergency. She reports not taking her BP medications as they make her feel weird. She also smokes about a pack of cigarettes in a day and a half.  She is not on any anticoagulation. Will lower her blood pressure with labetalol and cleviprex gtt with goal SBP of 130-150 systolic, repeat stability scan in 6 hours. MRI brain w + w/o contrast. CTA with no aneurysm or vascular malformation. No antiplatelet or DVT ppx.  This patient is critically ill and at significant risk of neurological worsening, death and care requires constant monitoring of vital signs, hemodynamics,respiratory and cardiac monitoring, neurological assessment, discussion with family, other specialists and medical decision making of  high complexity. I spent 50 minutes of neurocritical care time  in the care of  this patient. This was time spent independent of any time provided by nurse practitioner or PA.  At patient's request, I attempted to call her daughter Ms. Darnell Level several times but unable to get in touch with her over phone.  Erick Blinks [facility] 10/10/2023  11:10 AM   Erick Blinks, MD [facility] [phone]   If 7pm to 7am, please call on call as listed on AMION.

## 2023-10-10 NOTE — ED Triage Notes (Signed)
Pt BIB [facility] as a Code Stroke. PT woke up around 3:30 which is normal for her.   PT was driving around 1:61 and discovered weakness and lack of coordination as she was trying to break with her left foot.    EMS endorses left arm drift, left leg weakness and lack of coordination.  Speech is a little slurred but pt says that is her baseline. PT is A&O x4.   224/120 99% HR 70 CBG 127

## 2023-10-11 ENCOUNTER — Encounter (HOSPITAL_COMMUNITY): Payer: Self-pay | Admitting: Neurology

## 2023-10-11 DIAGNOSIS — S93402D Sprain of unspecified ligament of left ankle, subsequent encounter: Secondary | ICD-10-CM | POA: Diagnosis not present

## 2023-10-11 DIAGNOSIS — N189 Chronic kidney disease, unspecified: Secondary | ICD-10-CM | POA: Diagnosis present

## 2023-10-11 DIAGNOSIS — I610 Nontraumatic intracerebral hemorrhage in hemisphere, subcortical: Secondary | ICD-10-CM | POA: Diagnosis not present

## 2023-10-11 DIAGNOSIS — E785 Hyperlipidemia, unspecified: Secondary | ICD-10-CM | POA: Diagnosis not present

## 2023-10-11 DIAGNOSIS — I10 Essential (primary) hypertension: Secondary | ICD-10-CM | POA: Diagnosis not present

## 2023-10-11 DIAGNOSIS — F419 Anxiety disorder, unspecified: Secondary | ICD-10-CM | POA: Diagnosis present

## 2023-10-11 DIAGNOSIS — J449 Chronic obstructive pulmonary disease, unspecified: Secondary | ICD-10-CM

## 2023-10-11 MED ORDER — ALPRAZOLAM 0.5 MG PO TABS
0.5000 mg | ORAL_TABLET | Freq: Two times a day (BID) | ORAL | Status: DC | PRN
  Administered 2023-10-11: 0.5 mg via ORAL
  Filled 2023-10-11: qty 1

## 2023-10-11 MED ORDER — NICOTINE 21 MG/24HR TD PT24
21.0000 mg | MEDICATED_PATCH | Freq: Every day | TRANSDERMAL | Status: DC
  Administered 2023-10-11 – 2023-10-12 (×2): 21 mg via TRANSDERMAL
  Filled 2023-10-11 (×2): qty 1

## 2023-10-11 MED ORDER — PANTOPRAZOLE SODIUM 40 MG PO TBEC
40.0000 mg | DELAYED_RELEASE_TABLET | Freq: Every day | ORAL | Status: DC
  Administered 2023-10-11: 40 mg via ORAL
  Filled 2023-10-11: qty 1

## 2023-10-11 MED ORDER — IRBESARTAN 150 MG PO TABS
150.0000 mg | ORAL_TABLET | Freq: Every day | ORAL | Status: DC
  Administered 2023-10-11 – 2023-10-12 (×2): 150 mg via ORAL
  Filled 2023-10-11 (×2): qty 1

## 2023-10-11 MED ORDER — OXYCODONE-ACETAMINOPHEN 5-325 MG PO TABS
1.0000 | ORAL_TABLET | Freq: Four times a day (QID) | ORAL | Status: DC | PRN
  Administered 2023-10-12 (×2): 1 via ORAL
  Filled 2023-10-11 (×2): qty 1

## 2023-10-11 MED ORDER — HYDRALAZINE HCL 50 MG PO TABS
50.0000 mg | ORAL_TABLET | Freq: Three times a day (TID) | ORAL | Status: DC
  Administered 2023-10-11 (×3): 50 mg via ORAL
  Filled 2023-10-11 (×4): qty 1

## 2023-10-11 MED ORDER — HEPARIN SODIUM (PORCINE) 5000 UNIT/ML IJ SOLN
5000.0000 [IU] | Freq: Three times a day (TID) | INTRAMUSCULAR | Status: DC
  Administered 2023-10-11 – 2023-10-12 (×4): 5000 [IU] via SUBCUTANEOUS
  Filled 2023-10-11 (×4): qty 1

## 2023-10-11 MED ORDER — LABETALOL HCL 5 MG/ML IV SOLN
20.0000 mg | INTRAVENOUS | Status: DC | PRN
  Administered 2023-10-11 – 2023-10-12 (×4): 20 mg via INTRAVENOUS
  Filled 2023-10-11 (×4): qty 4

## 2023-10-11 NOTE — Consult Note (Signed)
Physical Medicine and Rehabilitation Consult Reason for Consult: Rehab  Referring Physician: Dr. Pamalee Leyden   HPI: Kathy Vargas is a 72 y.o. female with PMH of HTN, HLD, DJD, anxiety, COPD, CKD, alcohol abuse in remission, depression, headaches who presented to the hospital with left-sided weakness that she noticed while driving to work.  She had slurred speech but reports this is baseline.  BP was elevated and medications were started.  CT head showed 1.7 cm intraparenchymal hemorrhage centered in the posterior limb of the right internal capsule.  CTA head and neck with no large vessel occlusion or significant stenosis.  Chart review indicates patient's daughter reported she does not like to take her blood pressure medicine.  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 Patient reports L ankle pain and swelling as she twisted her ankle getting out of her car.  Xray ankle negative for fracture.  Seen by physical therapy, requiring mod assist for step pivot transfers  She has a apartment with 5 steps to enter. If she needs to she can also stay with daughter.   Review of Systems  Constitutional:  Negative for chills and fever.  HENT:  Positive for hearing loss (chronic L ear). Negative for congestion.   Eyes:  Negative for blurred vision and double vision.  Respiratory:  Negative for sputum production and shortness of breath.   Cardiovascular:  Negative for chest pain and palpitations.  Gastrointestinal:  Positive for constipation. Negative for abdominal pain, diarrhea, nausea and vomiting.  Genitourinary: Negative.   Musculoskeletal:  Positive for joint pain.  Skin:  Negative for rash.  Neurological:  Positive for focal weakness. Negative for sensory change and  headaches.   Past Medical History:  Diagnosis Date   ABDOMINAL TENDERNESS 01/21/2011   ACUTE GINGIVITIS NONPLAQUE INDUCED 06/11/2010   ADD 09/05/2008   Alcohol abuse, in remission 09/27/2007   ALLERGIC RHINITIS 03/02/2009   Anemia    ANXIETY 07/02/2007   BACK PAIN 05/19/2009   CHRONIC OBSTRUCTIVE PULMONARY DISEASE, ACUTE EXACERBATION 08/06/2009   COPD 07/02/2007   DEGENERATIVE JOINT DISEASE, CERVICAL SPINE 07/02/2007   DEPRESSION 07/02/2007   Dyspnea    GASTROENTERITIS, VIRAL 11/29/2010   GERD 01/21/2011   Headache(784.0) 07/02/2007   History of kidney stones    Hypertension    HYPOTHYROIDISM 07/02/2007   Lumbar disc disease    Lumbar pain with radiation down right leg 06/01/2011   OSTEOARTHRITIS 07/02/2007   Other and unspecified ovarian cyst 05/19/2009   PAIN, CHRONIC, DUE TO TRAUMA 07/02/2007   PELVIC  PAIN 03/29/2010   UTI 05/19/2009   Vitamin D insufficiency    Past Surgical History:  Procedure Laterality Date   ABDOMINAL HYSTERECTOMY     CATARACT EXTRACTION W/ INTRAOCULAR LENS IMPLANT Bilateral    CHOLECYSTECTOMY     CYSTOSCOPY/URETEROSCOPY/HOLMIUM LASER/STENT PLACEMENT Left 07/01/2022   Procedure: CYSTOSCOPY/ RETROGRADE/URETEROSCOPY/STENT PLACEMENT;  Surgeon: Jannifer Hick, MD;  Location: [facility];  Service: Urology;  Laterality: Left;   eyebrow lift     IR URETERAL STENT LEFT NEW ACCESS W/O SEP NEPHROSTOMY CATH  07/01/2022   LIPOSUCTION     LUMBAR DISC SURGERY     L4, L5   NEPHROLITHOTOMY Left 07/01/2022   Procedure: NEPHROLITHOTOMY PERCUTANEOUS/ INTERVENTIONAL RADIOLOGY TO PLACE LEFT NEPHROSTOMY TUBE PRIOR;  Surgeon: Jannifer Hick, MD;  Location: [facility];  Service:  Urology;  Laterality: Left;  ONLY NEEDS 120 MIN FOR ALL PROCEDURES   URETEROSCOPY WITH HOLMIUM LASER LITHOTRIPSY Right 04/12/2022   Procedure: URETEROSCOPY WITH HOLMIUM LASER LITHOTRIPSY, STENT PLACEMENT;  Surgeon: Despina Arias, MD;  Location: [facility];  Service: Urology;  Laterality: Right;   Family  History  Problem Relation Age of Onset   Alcohol abuse Other    Anxiety disorder Other    Coronary artery disease Other    Depression Other    Stroke Other    Social History:  reports that she has been smoking cigarettes. She started smoking about 60 years ago. She has a 30.4 pack-year smoking history. She has never used smokeless tobacco. She reports that she does not currently use alcohol. She reports that she does not use drugs. Allergies:  Allergies  Allergen Reactions   Morphine Nausea And Vomiting   Ceftin [Cefuroxime]     Nausea and vomiting   Medications Prior to Admission  Medication Sig Dispense Refill   ALPRAZolam (XANAX) 0.5 MG tablet Take 1 tablet (0.5 mg) by mouth 2 times daily as needed. 60 tablet 5   amphetamine-dextroamphetamine (ADDERALL) 30 MG tablet Take 1 tablet by mouth 2 (two) times daily. 60 tablet 0   Biotin w/ Vitamins C & E (HAIR/SKIN/NAILS PO) Take 1 tablet by mouth daily.     Calcium-Magnesium-Zinc (CAL-MAG-ZINC PO) Take 1 tablet by mouth at bedtime.     Cholecalciferol (VITAMIN D3 PO) Take 1 tablet by mouth in the morning.     cyanocobalamin (VITAMIN B12) 1000 MCG tablet Take 1 tablet (1,000 mcg total) by mouth daily. 90 tablet 3   fluticasone-salmeterol (ADVAIR DISKUS) 250-50 MCG/ACT AEPB Inhale 1 puff into the lungs 2 (two) times daily as needed (Asthma). 180 each 3   hydrALAZINE (APRESOLINE) 50 MG tablet Take 1 tablet (50 mg total) by mouth 3 (three) times daily. 90 tablet 1   irbesartan (AVAPRO) 150 MG tablet Take 1 tablet (150 mg total) by mouth daily. 90 tablet 3   pantoprazole (PROTONIX) 40 MG tablet Take 1 tablet (40 mg total) by mouth daily. 90 tablet 3   fluticasone (CUTIVATE) 0.005 % ointment Apply to the skin 2 times daily if needed 30 g 1    Home: Home Living Family/patient expects to be discharged to:: Private residence Living Arrangements: Alone Available Help at Discharge: Family, Available 24 hours/day Type of Home: Apartment Home  Access: Stairs to enter [employer] of Steps: 5 Entrance Stairs-Rails: Right, Can reach both, Left Home Layout: One level Bathroom Shower/Tub: Tub/shower unit Home Equipment: Grab bars - tub/shower  Lives With: Alone  Functional History: Prior Function Prior Level of Function : Independent/Modified Independent, Driving 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 Functional Status:  Mobility: Bed Mobility Overal bed mobility: Needs Assistance Bed Mobility: Supine to Sit Supine to sit: HOB elevated, Used rails Transfers Overall transfer level: Needs assistance Equipment used: Rolling walker (2 wheels) Transfers: Sit to/from Stand, Bed to chair/wheelchair/BSC Sit to Stand: Min assist Bed to/from chair/wheelchair/BSC transfer type:: Step pivot Step pivot transfers: Mod assist General transfer comment: some assist to stand for balance and due to pain L ankle; stepping to recliner with RW and A for balance and cues for UE weight bearing and encouragement due to pain L foot Ambulation/Gait General Gait Details: unable due to pain, limited coordination/strength/sensation L LE    ADL:    Cognition: Cognition Overall Cognitive Status: Impaired/Different from baseline Orientation Level: Oriented X4 Attention:  Sustained, Selective Sustained Attention: Appears intact Selective Attention: Impaired Selective Attention Impairment: Verbal basic Memory: Appears intact Awareness: Impaired Awareness Impairment: Emergent impairment Problem Solving: Impaired Problem Solving Impairment: Verbal basic Cognition Arousal: Alert Behavior During Therapy: WFL for tasks assessed/performed Overall Cognitive Status: Impaired/Different from baseline Area of Impairment: Safety/judgement, Problem solving, Attention Current Attention Level: Selective Safety/Judgement: Decreased awareness of deficits Problem Solving: Slow processing General Comments: slower processing,  felt L LE deficits all from her ankle injury, also not aware of need for follow up inpatient rehab and tearful when considering.  Blood pressure 137/82, pulse 81, temperature 97.7 F (36.5 C), temperature source Oral, resp. rate (!) 22, weight 55.6 kg, SpO2 96%. Physical Exam    General: No apparent distress HEENT: Head is normocephalic, atraumatic, sclera anicteric, oral mucosa pink and moist, dentition intact Neck: Supple without JVD or lymphadenopathy Heart: Reg rate and rhythm. No murmurs rubs or gallops Chest: CTA bilaterally without wheezes, rales, or rhonchi; no distress Abdomen: Soft, non-tender, non-distended, bowel sounds positive. Extremities: No clubbing, cyanosis, or edema. Pulses are 2+ Psych: Pt's affect is appropriate. Pt is cooperative Skin: Clean and intact without signs of breakdown Neuro:    Mental Status: AAO to person, place, month year and day of the week, not date, memory intact, fund of knowledge appropriate Speech/Languate: Naming and repetition intact, fluent, follows simple commands +dysarthric speech- pt reports this is her baseline CRANIAL NERVES: II: PERRL. Visual fields full III, IV, VI: EOM intact, no gaze preference or deviation V: normal sensation bilaterally VII: no asymmetry VIII: decreased hearing L side- chronic IX, X: normal palatal elevation XI: 5/5 head turn and 5/5 shoulder shrug bilaterally XII: Tongue midline   MOTOR: RUE: 5/5 Deltoid, 5/5 Biceps, 5/5 Triceps,5/5 Grip LUE: 4+/5 Deltoid, 5/5 Biceps, 5/5 Triceps, 5/5 Grip RLE: HF 5/5, KE 5/5, ADF 5/5, APF 5/5 LLE: HF 4-/5, KE 4/5, ADF 4/5, APF 4/5  SENSORY: Normal to touch all 4 extremities  Coordination: Normal finger to nose and heel to shin, no tremor, no dysmetria  MSK: TTP-greatest lateral L ankle,  swelling L ankle. Ace wrap in place, pain with passive inversion   Results for orders placed or performed during the hospital encounter of 10/10/23 (from the past 24 hour(s))   MRSA Next Gen by PCR, Nasal     Status: None   Collection Time: 10/10/23  1:49 PM   Specimen: Nasal Mucosa; Nasal Swab  Result Value Ref Range   MRSA by PCR Next Gen NOT DETECTED NOT DETECTED  Urinalysis, Routine w reflex microscopic -Urine, Clean Catch     Status: Abnormal   Collection Time: 10/10/23  3:19 PM  Result Value Ref Range   Color, Urine STRAW (A) YELLOW   APPearance CLEAR CLEAR   Specific Gravity, Urine 1.027 1.005 - 1.030   pH 6.0 5.0 - 8.0   Glucose, UA NEGATIVE NEGATIVE mg/dL   Hgb urine dipstick SMALL (A) NEGATIVE   Bilirubin Urine NEGATIVE NEGATIVE   Ketones, ur NEGATIVE NEGATIVE mg/dL   Protein, ur NEGATIVE NEGATIVE mg/dL   Nitrite NEGATIVE NEGATIVE   Leukocytes,Ua LARGE (A) NEGATIVE   RBC / HPF 11-20 0 - 5 RBC/hpf   WBC, UA >50 0 - 5 WBC/hpf   Bacteria, UA NONE SEEN NONE SEEN   Squamous Epithelial / HPF 0-5 0 - 5 /HPF  Rapid urine drug screen (hospital performed)     Status: Abnormal   Collection Time: 10/10/23  3:19 PM  Result Value Ref Range   Opiates NONE  DETECTED NONE DETECTED   Cocaine NONE DETECTED NONE DETECTED   Benzodiazepines POSITIVE (A) NONE DETECTED   Amphetamines POSITIVE (A) NONE DETECTED   Tetrahydrocannabinol POSITIVE (A) NONE DETECTED   Barbiturates NONE DETECTED NONE DETECTED   DG Ankle 2 Views Left  Result Date: 10/10/2023 CLINICAL DATA:  Left ankle pain EXAM: LEFT ANKLE - 2 VIEW COMPARISON:  None Available. FINDINGS: There is no evidence of fracture, dislocation, or joint effusion. There is no evidence of arthropathy or other focal bone abnormality. Soft tissues are unremarkable. IMPRESSION: Negative. Electronically Signed   By: Charlett Nose M.D.   On: 10/10/2023 20:10   ECHOCARDIOGRAM COMPLETE  Result Date: 10/10/2023    ECHOCARDIOGRAM REPORT   Patient Name:   Meline Maxson Date of Exam: 10/10/2023 Medical Rec #:  [id number]       Height:       65.0 in Accession #:    [id number]      Weight:       122.6 lb Date of Birth:  Feb 14, 1951        BSA:          1.607 m Patient Age:    72 years        BP:           118/72 mmHg Patient Gender: F               HR:           72 bpm. Exam Location:  Inpatient Procedure: 2D Echo, Cardiac Doppler and Color Doppler Indications:    Stroke I63.9  History:        Patient has no prior history of Echocardiogram examinations.                 Stroke and COPD; Risk Factors:Hypertension, Dyslipidemia and                 Current Smoker.  Sonographer:    Dondra Prader RVT RCS Referring Phys: [id number] Lynnae January IMPRESSIONS  1. Left ventricular ejection fraction, by estimation, is 60 to 65%. The left ventricle has normal function. The left ventricle has no regional wall motion abnormalities. Left ventricular diastolic parameters are indeterminate.  2. Right ventricular systolic function is normal. The right ventricular size is normal. Tricuspid regurgitation signal is inadequate for assessing PA pressure.  3. The mitral valve is normal in structure. Trivial mitral valve regurgitation. No evidence of mitral stenosis.  4. The aortic valve is normal in structure. Aortic valve regurgitation is trivial. No aortic stenosis is present.  5. The inferior vena cava is normal in size with greater than 50% respiratory variability, suggesting right atrial pressure of 3 mmHg. Conclusion(s)/Recommendation(s): No intracardiac source of embolism detected on this transthoracic study. Consider a transesophageal echocardiogram to exclude cardiac source of embolism if clinically indicated. FINDINGS  Left Ventricle: Left ventricular ejection fraction, by estimation, is 60 to 65%. The left ventricle has normal function. The left ventricle has no regional wall motion abnormalities. The left ventricular internal cavity size was normal in size. There is  no left ventricular hypertrophy. Left ventricular diastolic parameters are indeterminate. Normal left ventricular filling pressure. Right Ventricle: The right ventricular size is normal. No  increase in right ventricular wall thickness. Right ventricular systolic function is normal. Tricuspid regurgitation signal is inadequate for assessing PA pressure. Left Atrium: Left atrial size was normal in size. Right Atrium: Right atrial size was normal in size. Pericardium: There is no evidence of pericardial effusion. Mitral  Valve: The mitral valve is normal in structure. Trivial mitral valve regurgitation. No evidence of mitral valve stenosis. Tricuspid Valve: The tricuspid valve is normal in structure. Tricuspid valve regurgitation is trivial. No evidence of tricuspid stenosis. Aortic Valve: The aortic valve is normal in structure. Aortic valve regurgitation is trivial. No aortic stenosis is present. Aortic valve mean gradient measures 3.0 mmHg. Aortic valve peak gradient measures 5.6 mmHg. Aortic valve area, by VTI measures 2.42 cm. Pulmonic Valve: The pulmonic valve was normal in structure. Pulmonic valve regurgitation is not visualized. No evidence of pulmonic stenosis. Aorta: The aortic root is normal in size and structure. Venous: The inferior vena cava is normal in size with greater than 50% respiratory variability, suggesting right atrial pressure of 3 mmHg. IAS/Shunts: The interatrial septum appears to be lipomatous. No atrial level shunt detected by color flow Doppler.  LEFT VENTRICLE PLAX 2D LVIDd:         4.00 cm   Diastology LVIDs:         2.70 cm   LV e' medial:    5.77 cm/s LV PW:         1.00 cm   LV E/e' medial:  13.0 LV IVS:        0.90 cm   LV e' lateral:   8.59 cm/s LVOT diam:     1.80 cm   LV E/e' lateral: 8.7 LV SV:         60 LV SV Index:   37 LVOT Area:     2.54 cm  RIGHT VENTRICLE             IVC RV S prime:     12.70 cm/s  IVC diam: 1.30 cm TAPSE (M-mode): 2.3 cm LEFT ATRIUM             Index        RIGHT ATRIUM           Index LA diam:        2.80 cm 1.74 cm/m   RA Area:     10.90 cm LA Vol (A2C):   24.1 ml 15.00 ml/m  RA Volume:   22.20 ml  13.82 ml/m LA Vol (A4C):   21.2 ml  13.20 ml/m LA Biplane Vol: 24.4 ml 15.19 ml/m  AORTIC VALVE                    PULMONIC VALVE AV Area (Vmax):    2.24 cm     PV Vmax:       0.88 m/s AV Area (Vmean):   2.31 cm     PV Peak grad:  3.1 mmHg AV Area (VTI):     2.42 cm AV Vmax:           118.00 cm/s AV Vmean:          77.800 cm/s AV VTI:            0.248 m AV Peak Grad:      5.6 mmHg AV Mean Grad:      3.0 mmHg LVOT Vmax:         104.00 cm/s LVOT Vmean:        70.700 cm/s LVOT VTI:          0.236 m LVOT/AV VTI ratio: 0.95  AORTA Ao Root diam: 2.80 cm Ao Asc diam:  3.00 cm MITRAL VALVE MV Area (PHT): 3.17 cm    SHUNTS MV Decel Time: 239 msec    Systemic  VTI:  0.24 m MV E velocity: 74.90 cm/s  Systemic Diam: 1.80 cm MV A velocity: 85.90 cm/s MV E/A ratio:  0.87 Armanda Magic MD Electronically signed by Armanda Magic MD Signature Date/Time: 10/10/2023/7:15:27 PM    Final    MR BRAIN WO CONTRAST  Result Date: 10/10/2023 CLINICAL DATA:  Stroke, follow-up. EXAM: MRI HEAD WITHOUT CONTRAST TECHNIQUE: Multiplanar, multiecho pulse sequences of the brain and surrounding structures were obtained without intravenous contrast. COMPARISON:  Head CT October 10, 2023. FINDINGS: Brain: Focus of acute intraparenchymal hemorrhage centered in the posterior limb of the right internal capsule, extending into the putamen thalamus, measuring approximately 14 x 10 mm, correlating with findings described on prior head CT. There is no significant mass effect or midline shift. No hydrocephalus. Scattered and confluent foci of T2 hyperintensity are seen within the white matter of the cerebral hemispheres and within the pons, nonspecific. Remote infarct in the right middle cerebellar peduncle/right cerebellar hemisphere. Vascular: Normal flow voids. Skull and upper cervical spine: Normal marrow signal. Sinuses/Orbits: Negative. Other: None. IMPRESSION: 1. Focus of acute intraparenchymal hemorrhage centered in the posterior limb of the right internal capsule, extending into  the putamen and thalamus, correlating with findings described on prior head CT. No significant mass effect or midline shift. 2. Moderate chronic microvascular ischemic changes of the white matter. 3. Remote infarct in the right middle cerebellar peduncle/right cerebellar hemisphere. Electronically Signed   By: Baldemar Lenis M.D.   On: 10/10/2023 15:35   CT ANGIO HEAD NECK W WO CM (CODE STROKE)  Result Date: 10/10/2023 CLINICAL DATA:  Neuro deficit, acute stroke suspected. left-sided weakness. EXAM: CT ANGIOGRAPHY HEAD AND NECK WITH AND WITHOUT CONTRAST TECHNIQUE: Multidetector CT imaging of the head and neck was performed using the standard protocol during bolus administration of intravenous contrast. Multiplanar CT image reconstructions and MIPs were obtained to evaluate the vascular anatomy. Carotid stenosis measurements (when applicable) are obtained utilizing NASCET criteria, using the distal internal carotid diameter as the denominator. RADIATION DOSE REDUCTION: This exam was performed according to the departmental dose-optimization program which includes automated exposure control, adjustment of the mA and/or kV according to patient size and/or use of iterative reconstruction technique. CONTRAST:  75mL OMNIPAQUE IOHEXOL 350 MG/ML SOLN COMPARISON:  Same day CT head FINDINGS: CTA NECK FINDINGS Aortic arch: Standard branching. Imaged portion shows no evidence of aneurysm or dissection. No significant stenosis of the major arch vessel origins. Right carotid system: No evidence of dissection, stenosis (50% or greater), or occlusion. Left carotid system: No evidence of dissection, stenosis (50% or greater), or occlusion. Vertebral arteries: Codominant. No evidence of dissection, stenosis (50% or greater), or occlusion. Skeleton: Negative. Other neck: Negative. Upper chest: Negative. Review of the MIP images confirms the above findings CTA HEAD FINDINGS Anterior circulation: No significant  stenosis, proximal occlusion, aneurysm, or vascular malformation. Posterior circulation: No significant stenosis, proximal occlusion, aneurysm, or vascular malformation. Venous sinuses: As permitted by contrast timing, patent. Review of the MIP images confirms the above findings IMPRESSION: 1. No large vessel occlusion proximal hemodynamically significant stenosis. 2. No visible aneurysm or arteriovenous malformation. Electronically Signed   By: Feliberto Harts M.D.   On: 10/10/2023 08:38   CT HEAD CODE STROKE WO CONTRAST  Result Date: 10/10/2023 CLINICAL DATA:  Code stroke.  Neuro deficit, acute, stroke suspected EXAM: CT HEAD WITHOUT CONTRAST TECHNIQUE: Contiguous axial images were obtained from the base of the skull through the vertex without intravenous contrast. RADIATION DOSE REDUCTION: This exam was  performed according to the departmental dose-optimization program which includes automated exposure control, adjustment of the mA and/or kV according to patient size and/or use of iterative reconstruction technique. COMPARISON:  None Available. FINDINGS: Brain: Acute 1.4 x 1.2 x 1.7 cm (estimated volume of 1.4 mL) intraparenchymal hemorrhage centered in the posterior limb of the right internal capsule. Mild surrounding edema. No substantial mass effect. No midline shift. No hydrocephalus. No mass lesion. Vascular: No hyperdense vessel identified. Skull: No acute fracture. Sinuses/Orbits: Clear sinuses.  No acute orbital findings. Other: No mastoid effusions. ASPECTS [facility] Stroke Program Early CT Score) Total score (0-10 with 10 being normal): 10. IMPRESSION: 1. Acute 1.7 cm intraparenchymal hemorrhage centered in the posterior limb of the right internal capsule. No substantial mass effect. Findings discussed with Dr. Ezzie Dural via telephone at 8:24 AM. Electronically Signed   By: Feliberto Harts M.D.   On: 10/10/2023 08:25    Assessment/Plan: Diagnosis: Right internal capsule hemorrhage likely due to  hypertension Does the need for close, 24 hr/day medical supervision in concert with the patient's rehab needs make it unreasonable for this patient to be served in a less intensive setting? Yes Co-Morbidities requiring supervision/potential complications:  -HTN, HLD, Hx of ETOH use, L ankle sprain, anxiety, COPD, CKD Due to bladder management, bowel management, safety, skin/wound care, disease management, medication administration, pain management, and patient education, does the patient require 24 hr/day rehab nursing? Yes Does the patient require coordinated care of a physician, rehab nurse, therapy disciplines of PT/OT to address physical and functional deficits in the context of the above medical diagnosis(es)? Yes Addressing deficits in the following areas: balance, endurance, locomotion, strength, transferring, bowel/bladder control, bathing, dressing, feeding, grooming, toileting, and psychosocial support Can the patient actively participate in an intensive therapy program of at least 3 hrs of therapy per day at least 5 days per week? Yes The potential for patient to make measurable gains while on inpatient rehab is excellent Anticipated functional outcomes upon discharge from inpatient rehab are modified independent  with PT, modified independent with OT, n/a with SLP. Estimated rehab length of stay to reach the above functional goals is: 5-7 days Anticipated discharge destination: Home Overall Rehab/Functional Prognosis: excellent  POST ACUTE RECOMMENDATIONS: This patient's condition is appropriate for continued rehabilitative care in the following setting: CIR Patient has agreed to participate in recommended program. Yes Note that insurance prior authorization may be required for reimbursement for recommended care.  Comment: I think she would be a good candidate for CIR when medically stable. Rehab coordinator to f/u    MEDICAL RECOMMENDATIONS: Consider ASO L ankle  Consider  additional laxative to prevent constipation    I have personally performed a face to face diagnostic evaluation of this patient. Additionally, I have examined the patient's medical record including any pertinent labs and radiographic images. If the physician assistant has documented in this note, I have reviewed and edited or otherwise concur with the physician assistant's documentation.  Thanks,  Fanny Dance, MD 10/11/2023

## 2023-10-11 NOTE — Evaluation (Signed)
Speech Language Pathology Evaluation Patient Details Name: Kathy Vargas MRN: [id number] DOB: September 10, 1951 Today's Date: 10/11/2023 Time: 6962-9528 SLP Time Calculation (min) (ACUTE ONLY): 11 min  Problem List:  Patient Active Problem List   Diagnosis Date Noted   ICH (intracerebral hemorrhage) (HCC) 10/10/2023   Epigastric pain 04/16/2023   Diarrhea 12/10/2022   Urine frequency 12/10/2022   Left renal stone 07/01/2022   Recurrent UTI 01/07/2022   Hematoma 06/27/2021   Essential hypertension 02/14/2021   Other chest pain 07/01/2020   Right otitis media 07/01/2020   Acute URI 02/04/2019   Hyponatremia 12/12/2017   Fever 08/15/2017   Left low back pain 05/30/2017   Gross hematuria 06/14/2016   Hyperlipidemia 05/06/2016   Hyperglycemia 05/06/2016   Venous (peripheral) insufficiency 05/06/2016   COPD exacerbation (HCC) 04/15/2015   Insomnia 04/15/2015   Fatigue 10/15/2014   Smoker 10/15/2014   Chronic pain syndrome 02/21/2014   Cough 12/25/2013   Angioedema 09/24/2013   Menopausal problem 02/09/2012   Lumbar pain with radiation down right leg 06/01/2011   Encounter for well adult exam with abnormal findings 05/29/2011   GERD 01/21/2011   Vitamin D deficiency 11/29/2010   ACUTE GINGIVITIS NONPLAQUE INDUCED 06/11/2010   PELVIC  PAIN 03/29/2010   DISC DISEASE, LUMBAR 08/06/2009   UTI (urinary tract infection) 05/19/2009   Other and unspecified ovarian cyst 05/19/2009   Backache 05/19/2009   Allergic rhinitis 03/02/2009   Attention deficit disorder 09/05/2008   Alcohol abuse, in remission 09/27/2007   Hypothyroidism 07/02/2007   Anxiety state 07/02/2007   Depression 07/02/2007   PAIN, CHRONIC, DUE TO TRAUMA 07/02/2007   COPD (chronic obstructive pulmonary disease) (HCC) 07/02/2007   OSTEOARTHRITIS 07/02/2007   DEGENERATIVE JOINT DISEASE, CERVICAL SPINE 07/02/2007   Past Medical History:  Past Medical History:  Diagnosis Date   ABDOMINAL TENDERNESS 01/21/2011    ACUTE GINGIVITIS NONPLAQUE INDUCED 06/11/2010   ADD 09/05/2008   Alcohol abuse, in remission 09/27/2007   ALLERGIC RHINITIS 03/02/2009   Anemia    ANXIETY 07/02/2007   BACK PAIN 05/19/2009   CHRONIC OBSTRUCTIVE PULMONARY DISEASE, ACUTE EXACERBATION 08/06/2009   COPD 07/02/2007   DEGENERATIVE JOINT DISEASE, CERVICAL SPINE 07/02/2007   DEPRESSION 07/02/2007   Dyspnea    GASTROENTERITIS, VIRAL 11/29/2010   GERD 01/21/2011   Headache(784.0) 07/02/2007   History of kidney stones    Hypertension    HYPOTHYROIDISM 07/02/2007   Lumbar disc disease    Lumbar pain with radiation down right leg 06/01/2011   OSTEOARTHRITIS 07/02/2007   Other and unspecified ovarian cyst 05/19/2009   PAIN, CHRONIC, DUE TO TRAUMA 07/02/2007   PELVIC  PAIN 03/29/2010   UTI 05/19/2009   Vitamin D insufficiency    Past Surgical History:  Past Surgical History:  Procedure Laterality Date   ABDOMINAL HYSTERECTOMY     CATARACT EXTRACTION W/ INTRAOCULAR LENS IMPLANT Bilateral    CHOLECYSTECTOMY     CYSTOSCOPY/URETEROSCOPY/HOLMIUM LASER/STENT PLACEMENT Left 07/01/2022   Procedure: CYSTOSCOPY/ RETROGRADE/URETEROSCOPY/STENT PLACEMENT;  Surgeon: Jannifer Hick, MD;  Location: [facility];  Service: Urology;  Laterality: Left;   eyebrow lift     IR URETERAL STENT LEFT NEW ACCESS W/O SEP NEPHROSTOMY CATH  07/01/2022   LIPOSUCTION     LUMBAR DISC SURGERY     L4, L5   NEPHROLITHOTOMY Left 07/01/2022   Procedure: NEPHROLITHOTOMY PERCUTANEOUS/ INTERVENTIONAL RADIOLOGY TO PLACE LEFT NEPHROSTOMY TUBE PRIOR;  Surgeon: Jannifer Hick, MD;  Location: [facility];  Service: Urology;  Laterality: Left;  ONLY NEEDS 120 MIN FOR ALL  PROCEDURES   URETEROSCOPY WITH HOLMIUM LASER LITHOTRIPSY Right 04/12/2022   Procedure: URETEROSCOPY WITH HOLMIUM LASER LITHOTRIPSY, STENT PLACEMENT;  Surgeon: Despina Arias, MD;  Location: [facility];  Service: Urology;  Laterality: Right;   HPI:  Kathy Vargas is a 72 yo female presenting to ED 11/19 with  acute onset of L sided weakness. CTH with small basal ganglia ICH. PMH includes HTN, chronic pain, anxiety, COPD, depression, headaches, alcohol abuse (in remission)   Assessment / Plan / Recommendation Clinical Impression  Pt reports living at home alone, where she is typically fully independent and continues to work cleaning houses. Pt scored 25/30 on the SLUMS, which is slightly below a score of 27 or above that is considered WFL. Pt demonstrates mild deficits related to problem solving and awareness. She did effectively self-monitor and self-correct during a clock drawing task, although lacks insight into effect of deficits on complex and functional tasks. Overall, feel that pt is relatively close to her baseline. Provided education regarding f/u with SLP if she notes increased difficulty upon d/c home. No further SLP f/u is necessary acutely. Will s/o.    SLP Assessment  SLP Recommendation/Assessment: All further Speech Lanaguage Pathology  needs can be addressed in the next venue of care SLP Visit Diagnosis: Cognitive communication deficit (R41.841)    Recommendations for follow up therapy are one component of a multi-disciplinary discharge planning process, led by the attending physician.  Recommendations may be updated based on patient status, additional functional criteria and insurance authorization.    Follow Up Recommendations  Outpatient SLP    Assistance Recommended at Discharge  PRN  Functional Status Assessment Patient has had a recent decline in their functional status and demonstrates the ability to make significant improvements in function in a reasonable and predictable amount of time.  Frequency and Duration           SLP Evaluation Cognition  Overall Cognitive Status: Impaired/Different from baseline Orientation Level: Oriented X4 Attention: Sustained;Selective Sustained Attention: Appears intact Selective Attention: Impaired Selective Attention Impairment: Verbal  basic Memory: Appears intact Awareness: Impaired Awareness Impairment: Emergent impairment Problem Solving: Impaired Problem Solving Impairment: Verbal basic       Comprehension  Auditory Comprehension Overall Auditory Comprehension: Appears within functional limits for tasks assessed    Expression Expression Primary Mode of Expression: Verbal Verbal Expression Overall Verbal Expression: Appears within functional limits for tasks assessed   Oral / Motor  Oral Motor/Sensory Function Overall Oral Motor/Sensory Function: Within functional limits Motor Speech Overall Motor Speech: Appears within functional limits for tasks assessed            Gwynneth Aliment, M.A., CF-SLP Speech Language Pathology, Acute Rehabilitation Services  Secure Chat preferred [phone]  10/11/2023, 11:10 AM

## 2023-10-11 NOTE — Progress Notes (Signed)
? ?  Inpatient Rehab Admissions Coordinator : ? ?Per therapy recommendations, patient was screened for CIR candidacy by Blue Ruggerio RN MSN.  At this time patient appears to be a potential candidate for CIR. I will place a rehab consult per protocol for full assessment. Please call me with any questions. ? ?Jerriyah Louis RN MSN ?Admissions Coordinator ?[phone] ?  ?

## 2023-10-11 NOTE — TOC CAGE-AID Note (Signed)
Transition of Care [facility]) - CAGE-AID Screening   Patient Details  Name: Kathy Vargas MRN: [id number] Date of Birth: May 06, 1951  [facility]) CM/SW Contact:    Mearl Latin, LCSW Phone Number: 10/11/2023, 12:08 PM   Clinical Narrative: Patient declined current ETOH use and is in remission. Resources not indicated.    CAGE-AID Screening:    Have You Ever Felt You Ought to Cut Down on Your Drinking or Drug Use?: No Have People Annoyed You By Critizing Your Drinking Or Drug Use?: No Have You Felt Bad Or Guilty About Your Drinking Or Drug Use?: No Have You Ever Had a Drink or Used Drugs First Thing In The Morning to Steady Your Nerves or to Get Rid of a Hangover?: No CAGE-AID Score: 0  Substance Abuse Education Offered: No (Not indicated)

## 2023-10-11 NOTE — Evaluation (Signed)
Physical Therapy Evaluation Patient Details Name: Kathy Vargas MRN: [id number] DOB: Mar 21, 1951 Today's Date: 10/11/2023  History of Present Illness  Patient is a 72 y/o female admitted 10/10/23 with L side weakness and ataxia.  Found to have SBP in 200's and CT found to have subcortical ICH on head CT.  PMH positive for HTN, chronic pain, anxiety, COPD, DJD, depression, headaches, ETOH abuse (in remission), and arthritis.  Clinical Impression  Patient presents with decreased mobility due to L LE weakness and ataxia and pain L ankle after injury following symptom onset.  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  She was limited to bed to chair only with min/mod A due to painful L ankle.  She will benefit from skilled PT in the acute setting to progress mobility and activity tolerance.  Discussed with daughter bringing in shoe from home to trial an aircast.  Patient currently appropriate for inpatient rehab (>3 hours/day) though she is hopeful to progress and be able to go home with daughter assist.       If plan is discharge home, recommend the following: A little help with walking and/or transfers;Direct supervision/assist for medications management;Supervision due to cognitive status;Assistance with cooking/housework;Assist for transportation;A little help with bathing/dressing/bathroom   Can travel by private vehicle        Equipment Recommendations Rolling walker (2 wheels);BSC/3in1  Recommendations for Other Services  Rehab consult    Functional Status Assessment Patient has had a recent decline in their functional status and demonstrates the ability to make significant improvements in function in a reasonable and predictable amount of time.     Precautions / Restrictions Precautions Precautions: Fall Precaution Comments: SBP < 160      Mobility  Bed Mobility Overal bed mobility: Needs Assistance Bed Mobility:  Supine to Sit     Supine to sit: HOB elevated, Used rails          Transfers Overall transfer level: Needs assistance Equipment used: Rolling walker (2 wheels) Transfers: Sit to/from Stand, Bed to chair/wheelchair/BSC Sit to Stand: Min assist   Step pivot transfers: Mod assist       General transfer comment: some assist to stand for balance and due to pain L ankle; stepping to recliner with RW and A for balance and cues for UE weight bearing and encouragement due to pain L foot    Ambulation/Gait               General Gait Details: unable due to pain, limited coordination/strength/sensation L LE  Stairs            Wheelchair Mobility     Tilt Bed    Modified Rankin (Stroke Patients Only) Modified Rankin (Stroke Patients Only) Pre-Morbid Rankin Score: No symptoms Modified Rankin: Moderately severe disability     Balance Overall balance assessment: Needs assistance   Sitting balance-Leahy Scale: Good Sitting balance - Comments: able to take challenges, but more tippy to L   Standing balance support: Bilateral upper extremity supported, Reliant on assistive device for balance, During functional activity Standing balance-Leahy Scale: Poor Standing balance comment: UE support and A for balance mainly pain limited                             Pertinent Vitals/Pain Pain Assessment Pain Assessment: 0-10 Pain Score: 7  Pain Location: L Ankle Pain Descriptors / Indicators: Discomfort, Throbbing Pain Intervention(s): Monitored during session,  Premedicated before session    Home Living Family/patient expects to be discharged to:: Private residence Living Arrangements: Alone Available Help at Discharge: Family;Available 24 hours/day Type of Home: Apartment Home Access: Stairs to enter Entrance Stairs-Rails: Right;Can reach Technical sales engineer of Steps: 5   Home Layout: One level Home Equipment: Grab bars - tub/shower       Prior Function Prior Level of Function : Independent/Modified Independent;Driving               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     Extremity/Trunk Assessment   Upper Extremity Assessment Upper Extremity Assessment: Defer to OT evaluation    Lower Extremity Assessment Lower Extremity Assessment: LLE deficits/detail LLE Deficits / Details: AAROM grossly WFL except ankle limited DF with pain and edema, strength hip flexion 2+/5, knee extension 3/5, ankle DF 2/5, decreased coordination noted with stepping LLE Sensation: decreased proprioception    Cervical / Trunk Assessment Cervical / Trunk Assessment: Normal  Communication   Communication Communication: No apparent difficulties  Cognition Arousal: Alert Behavior During Therapy: WFL for tasks assessed/performed Overall Cognitive Status: Impaired/Different from baseline Area of Impairment: Safety/judgement, Problem solving, Attention                   Current Attention Level: Selective     Safety/Judgement: Decreased awareness of deficits   Problem Solving: Slow processing General Comments: slower processing, felt L LE deficits all from her ankle injury, also not aware of need for follow up inpatient rehab and tearful when considering.        General Comments General comments (skin integrity, edema, etc.): noted edema, tenderness L ankle, wrapped with ace wrap for support and edema control; daughter eduated to bring in shoes from home to allow to trial aircast    Exercises Total Joint Exercises Ankle Circles/Pumps: AROM, Both, 5 reps, Supine   Assessment/Plan    PT Assessment Patient needs continued PT services  PT Problem List Decreased cognition;Decreased activity tolerance;Decreased balance;Decreased mobility;Decreased coordination;Impaired sensation;Decreased safety awareness;Pain;Decreased range of motion;Decreased strength       PT Treatment Interventions DME  instruction;Cognitive remediation;Gait training;Patient/family education;Stair training;Functional mobility training;Therapeutic activities;Therapeutic exercise;Balance training    PT Goals (Current goals can be found in the Care Plan section)  Acute Rehab PT Goals Patient Stated Goal: to return home PT Goal Formulation: With patient/family Time For Goal Achievement: 10/25/23 Potential to Achieve Goals: Good    Frequency Min 1X/week     Co-evaluation               AM-PAC PT "6 Clicks" Mobility  Outcome Measure Help needed turning from your back to your side while in a flat bed without using bedrails?: A Little Help needed moving from lying on your back to sitting on the side of a flat bed without using bedrails?: A Little Help needed moving to and from a bed to a chair (including a wheelchair)?: A Little Help needed standing up from a chair using your arms (e.g., wheelchair or bedside chair)?: A Little Help needed to walk in hospital room?: Total Help needed climbing 3-5 steps with a railing? : Total 6 Click Score: 14    End of Session Equipment Utilized During Treatment: Gait belt Activity Tolerance: Patient limited by pain Patient left: in chair;with call bell/phone within reach;with chair alarm set;with family/visitor present   PT Visit Diagnosis: Ataxic gait (R26.0);Other abnormalities of gait and mobility (R26.89);Other symptoms and signs involving the nervous system (  R29.898)    Time: 1610-9604 PT Time Calculation (min) (ACUTE ONLY): 24 min   Charges:   PT Evaluation $PT Eval Moderate Complexity: 1 Mod PT Treatments $Therapeutic Activity: 8-22 mins PT General Charges $$ ACUTE PT VISIT: 1 Visit         Sheran Lawless, PT Acute Rehabilitation Services [phone] 10/11/2023   Elray Mcgregor 10/11/2023, 12:29 PM

## 2023-10-11 NOTE — Evaluation (Signed)
Occupational Therapy Evaluation Patient Details Name: Kathy Vargas MRN: [id number] DOB: 08-18-1951 Today's Date: 10/11/2023   History of Present Illness Patient is a 72 y/o female admitted 10/10/23 with L side weakness and ataxia.  Found to have SBP in 200's and CT found to have subcortical ICH on head CT.  PMH positive for HTN, chronic pain, anxiety, COPD, DJD, depression, headaches, ETOH abuse (in remission), and arthritis.   Clinical Impression   Pt admitted with subcortical ICH. Pt currently with functional limitations due to the deficits listed below (see OT Problem List). Prior to admit, pt was living alone and independent with all ADL and IADL tasks, and functional mobility. Pt was working as a Advertising copywriter part time. Patient will benefit from intensive inpatient follow up therapy, >3 hours/day.  Pt will benefit from acute skilled OT to increase their safety and independence with ADL and functional mobility for ADL to facilitate discharge. OT will continue to follow patient acutely.         If plan is discharge home, recommend the following: A lot of help with walking and/or transfers;A lot of help with bathing/dressing/bathroom;Assistance with cooking/housework;Help with stairs or ramp for entrance;Assist for transportation    Functional Status Assessment  Patient has had a recent decline in their functional status and demonstrates the ability to make significant improvements in function in a reasonable and predictable amount of time.  Equipment Recommendations  Other (comment) (defer to next venue of care)       Precautions / Restrictions Precautions Precautions: Fall Precaution Comments: SBP < 160      Mobility Bed Mobility     General bed mobility comments: up in recliner upon therapy arrival    Transfers Overall transfer level: Needs assistance Equipment used: Rolling walker (2 wheels) Transfers: Sit to/from Stand, Bed to chair/wheelchair/BSC Sit to Stand: Min  assist     Step pivot transfers: Mod assist     General transfer comment: VC for hand placement with RW management prior to sit to stand. Pt provided with verbal education and visual demonstration of off load LLE while using BUE to push into RW handles and be able to step forward with minimal weight into L ankle.      Balance Overall balance assessment: Needs assistance Sitting-balance support: No upper extremity supported, Feet supported Sitting balance-Leahy Scale: Fair Sitting balance - Comments: decreased sitting balance noted when challenged towards right side   Standing balance support: Bilateral upper extremity supported, Reliant on assistive device for balance, During functional activity Standing balance-Leahy Scale: Poor Standing balance comment: Able to maintain static balance without increased assist although when challenged with dynamic standing balance such as pulling up underwear, pt was unable to maintain balance without added assist.       ADL either performed or assessed with clinical judgement   ADL Overall ADL's : Needs assistance/impaired     Grooming: Oral care;Wash/dry face;Set up;Sitting   Upper Body Bathing: Set up;Sitting   Lower Body Bathing: Maximal assistance;Sitting/lateral leans;Sit to/from stand   Upper Body Dressing : Set up;Sitting   Lower Body Dressing: Maximal assistance;Sitting/lateral leans;Sit to/from stand   Toilet Transfer: Minimal assistance;Ambulation;Regular Toilet;BSC/3in1;Rolling walker (2 wheels) Toilet Transfer Details (indicate cue type and reason): BSC placed over toilet Toileting- Clothing Manipulation and Hygiene: Maximal assistance;Sit to/from stand;Sitting/lateral lean;Cueing for safety       Functional mobility during ADLs: Minimal assistance;Rolling walker (2 wheels)       Vision Baseline Vision/History: 1 Wears glasses Ability to See in Adequate Light:  0 Adequate Patient Visual Report: No change from  baseline Vision Assessment?: Wears glasses for reading     Perception Perception: Not tested       Praxis Praxis: Not tested       Pertinent Vitals/Pain Pain Assessment Pain Assessment: 0-10 Pain Score: 7  Pain Location: L ankle and thigh Pain Descriptors / Indicators: Aching, Constant Pain Intervention(s): Repositioned, Ice applied, Premedicated before session, Limited activity within patient's tolerance, Monitored during session     Extremity/Trunk Assessment Upper Extremity Assessment Upper Extremity Assessment: Right hand dominant;Overall [facility] for tasks assessed   Lower Extremity Assessment Lower Extremity Assessment: Defer to PT evaluation LLE Deficits / Details: AAROM grossly WFL except ankle limited DF with pain and edema, strength hip flexion 2+/5, knee extension 3/5, ankle DF 2/5, decreased coordination noted with stepping LLE Sensation: decreased proprioception   Cervical / Trunk Assessment Cervical / Trunk Assessment: Normal   Communication Communication Communication: Difficulty communicating thoughts/reduced clarity of speech (baseline dysarthia) Cueing Techniques: Verbal cues;Tactile cues   Cognition Arousal: Alert Behavior During Therapy: WFL for tasks assessed/performed Overall Cognitive Status: Impaired/Different from baseline Area of Impairment: Safety/judgement, Problem solving, Attention, Memory    Memory: Decreased short-term memory   Safety/Judgement: Decreased awareness of deficits, Decreased awareness of safety   Problem Solving: Slow processing, Difficulty sequencing, Requires verbal cues, Requires tactile cues General Comments: Difficulty following commands when experiencing L ankle pain requring re-education for sequencing and safety.     General Comments  L ankle ace wrapped from PT evaluation prior to therapy arrival. Noted edema. L dorsal aspect of hand with visible laceration. Temporary dressing applied.    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        OT Problem List: Decreased cognition;Decreased safety awareness;Decreased activity tolerance;Decreased knowledge of use of DME or AE;Pain;Decreased knowledge of precautions;Impaired balance (sitting and/or standing);Increased edema;Decreased coordination         OT Goals(Current goals can be found in the care plan section) Acute Rehab OT Goals Patient Stated Goal: to decrease pain OT Goal Formulation: Patient unable to participate in goal setting Time For Goal Achievement: 10/25/23 Potential to Achieve Goals: Good  OT Frequency: Min 1X/week       AM-PAC OT "6 Clicks" Daily Activity     Outcome Measure Help from another person eating meals?: None Help from another person taking care of personal grooming?: A Little Help from another person toileting, which includes using toliet, bedpan, or urinal?: A Lot Help from another person bathing (including washing, rinsing, drying)?: A Lot Help from another person to put on and taking off regular upper body clothing?: A Little Help from another person to put on and taking off regular lower body clothing?: A Lot 6 Click Score: 16   End of Session Equipment Utilized During Treatment: Gait belt;Rolling walker (2 wheels) Nurse Communication: Mobility  status  Activity Tolerance: Patient limited by  pain Patient left: in chair;with call bell/phone within reach;with chair alarm set;with family/visitor present;with nursing/sitter in room  OT Visit Diagnosis: Muscle weakness (generalized) (M62.81);Pain;Hemiplegia and hemiparesis Hemiplegia - Right/Left: Left Hemiplegia - dominant/non-dominant: Non-Dominant Hemiplegia - caused by: Nontraumatic intracerebral hemorrhage Pain - Right/Left: Left Pain - part of body: Ankle and joints of foot                Time: 1478-2956 OT Time Calculation (min): 46 min Charges:  OT General Charges $OT Visit: 1 Visit OT Evaluation $OT Eval High Complexity: 1 High OT Treatments $Self Care/Home Management : 8-22 mins $Therapeutic Activity: 8-22 mins  Limmie Patricia, OTR/L,CBIS  Supplemental OT - [facility] and [facility] Secure Chat Preferred    Cassandria Drew, Kathy Vargas 10/11/2023, 2:40 PM

## 2023-10-11 NOTE — Progress Notes (Addendum)
STROKE TEAM PROGRESS NOTE   BRIEF HPI Ms. Kathy Vargas is a 72 y.o. female with history of hypertension, chronic pain, anxiety, COPD, DJD, depression, headaches, alcohol abuse (in remission) who was brought in by EMS as a code stroke due to acute onset of left-sided weakness with a last known well of 0627.  Got to work around [id number] 11/19 and noted that her leg was acting funny. Leg wouldn't hold her when she got out of the car and then grabbed the car door and scrapped her hand. Left leg weakness main deficit, now having pain in the left ankle.  Woke up in her usual state of health.   NIH on Admission 6  SIGNIFICANT HOSPITAL EVENTS 11/19- Admit due to ICH   INTERIM HISTORY/SUBJECTIVE On the phone, sitting up in bed. No family at the bedside. Sitting having pain in left ankle, PRN ordered. Home BP medications started, PRN labetalol available.  Pt recommending CIR  Blood pressure adequately controlled.  She is not requiring any parenteral or IV blood pressure medicines.  Left hemiparesis persist.  Patient admits she has been noncompliant with taking her blood pressure medicines OBJECTIVE  CBC    Component Value Date/Time   WBC 6.2 10/10/2023 0757   RBC 4.25 10/10/2023 0757   HGB 12.6 10/10/2023 0804   HCT 37.0 10/10/2023 0804   PLT 248 10/10/2023 0757   MCV 90.8 10/10/2023 0757   MCH 29.4 10/10/2023 0757   MCHC 32.4 10/10/2023 0757   RDW 12.6 10/10/2023 0757   LYMPHSABS 1.5 10/10/2023 0757   MONOABS 0.6 10/10/2023 0757   EOSABS 0.3 10/10/2023 0757   BASOSABS 0.1 10/10/2023 0757    BMET    Component Value Date/Time   NA 138 10/10/2023 0804   K 4.2 10/10/2023 0804   CL 108 10/10/2023 0804   CO2 24 10/10/2023 0757   GLUCOSE 105 (H) 10/10/2023 0804   BUN 25 (H) 10/10/2023 0804   CREATININE 1.70 (H) 10/10/2023 0804   CALCIUM 9.2 10/10/2023 0757   GFRNONAA 36 (L) 10/10/2023 0757    IMAGING past 24 hours DG Ankle 2 Views Left  Result Date: 10/10/2023 CLINICAL DATA:  Left  ankle pain EXAM: LEFT ANKLE - 2 VIEW COMPARISON:  None Available. FINDINGS: There is no evidence of fracture, dislocation, or joint effusion. There is no evidence of arthropathy or other focal bone abnormality. Soft tissues are unremarkable. IMPRESSION: Negative. Electronically Signed   By: Charlett Nose M.D.   On: 10/10/2023 20:10   ECHOCARDIOGRAM COMPLETE  Result Date: 10/10/2023    ECHOCARDIOGRAM REPORT   Patient Name:   Kathy Vargas Date of Exam: 10/10/2023 Medical Rec #:  [id number]       Height:       65.0 in Accession #:    [id number]      Weight:       122.6 lb Date of Birth:  [birth year]       BSA:          1.607 m Patient Age:    72 years        BP:           118/72 mmHg Patient Gender: F               HR:           72 bpm. Exam Location:  Inpatient Procedure: 2D Echo, Cardiac Doppler and Color Doppler Indications:    Stroke I63.9  History:        Patient has  no prior history of Echocardiogram examinations.                 Stroke and COPD; Risk Factors:Hypertension, Dyslipidemia and                 Current Smoker.  Sonographer:    Dondra Prader RVT RCS Referring Phys: [id number] Lynnae January IMPRESSIONS  1. Left ventricular ejection fraction, by estimation, is 60 to 65%. The left ventricle has normal function. The left ventricle has no regional wall motion abnormalities. Left ventricular diastolic parameters are indeterminate.  2. Right ventricular systolic function is normal. The right ventricular size is normal. Tricuspid regurgitation signal is inadequate for assessing PA pressure.  3. The mitral valve is normal in structure. Trivial mitral valve regurgitation. No evidence of mitral stenosis.  4. The aortic valve is normal in structure. Aortic valve regurgitation is trivial. No aortic stenosis is present.  5. The inferior vena cava is normal in size with greater than 50% respiratory variability, suggesting right atrial pressure of 3 mmHg. Conclusion(s)/Recommendation(s): No intracardiac source of  embolism detected on this transthoracic study. Consider a transesophageal echocardiogram to exclude cardiac source of embolism if clinically indicated. FINDINGS  Left Ventricle: Left ventricular ejection fraction, by estimation, is 60 to 65%. The left ventricle has normal function. The left ventricle has no regional wall motion abnormalities. The left ventricular internal cavity size was normal in size. There is  no left ventricular hypertrophy. Left ventricular diastolic parameters are indeterminate. Normal left ventricular filling pressure. Right Ventricle: The right ventricular size is normal. No increase in right ventricular wall thickness. Right ventricular systolic function is normal. Tricuspid regurgitation signal is inadequate for assessing PA pressure. Left Atrium: Left atrial size was normal in size. Right Atrium: Right atrial size was normal in size. Pericardium: There is no evidence of pericardial effusion. Mitral Valve: The mitral valve is normal in structure. Trivial mitral valve regurgitation. No evidence of mitral valve stenosis. Tricuspid Valve: The tricuspid valve is normal in structure. Tricuspid valve regurgitation is trivial. No evidence of tricuspid stenosis. Aortic Valve: The aortic valve is normal in structure. Aortic valve regurgitation is trivial. No aortic stenosis is present. Aortic valve mean gradient measures 3.0 mmHg. Aortic valve peak gradient measures 5.6 mmHg. Aortic valve area, by VTI measures 2.42 cm. Pulmonic Valve: The pulmonic valve was normal in structure. Pulmonic valve regurgitation is not visualized. No evidence of pulmonic stenosis. Aorta: The aortic root is normal in size and structure. Venous: The inferior vena cava is normal in size with greater than 50% respiratory variability, suggesting right atrial pressure of 3 mmHg. IAS/Shunts: The interatrial septum appears to be lipomatous. No atrial level shunt detected by color flow Doppler.  LEFT VENTRICLE PLAX 2D LVIDd:          4.00 cm   Diastology LVIDs:         2.70 cm   LV e' medial:    5.77 cm/s LV PW:         1.00 cm   LV E/e' medial:  13.0 LV IVS:        0.90 cm   LV e' lateral:   8.59 cm/s LVOT diam:     1.80 cm   LV E/e' lateral: 8.7 LV SV:         60 LV SV Index:   37 LVOT Area:     2.54 cm  RIGHT VENTRICLE             IVC  RV S prime:     12.70 cm/s  IVC diam: 1.30 cm TAPSE (M-mode): 2.3 cm LEFT ATRIUM             Index        RIGHT ATRIUM           Index LA diam:        2.80 cm 1.74 cm/m   RA Area:     10.90 cm LA Vol (A2C):   24.1 ml 15.00 ml/m  RA Volume:   22.20 ml  13.82 ml/m LA Vol (A4C):   21.2 ml 13.20 ml/m LA Biplane Vol: 24.4 ml 15.19 ml/m  AORTIC VALVE                    PULMONIC VALVE AV Area (Vmax):    2.24 cm     PV Vmax:       0.88 m/s AV Area (Vmean):   2.31 cm     PV Peak grad:  3.1 mmHg AV Area (VTI):     2.42 cm AV Vmax:           118.00 cm/s AV Vmean:          77.800 cm/s AV VTI:            0.248 m AV Peak Grad:      5.6 mmHg AV Mean Grad:      3.0 mmHg LVOT Vmax:         104.00 cm/s LVOT Vmean:        70.700 cm/s LVOT VTI:          0.236 m LVOT/AV VTI ratio: 0.95  AORTA Ao Root diam: 2.80 cm Ao Asc diam:  3.00 cm MITRAL VALVE MV Area (PHT): 3.17 cm    SHUNTS MV Decel Time: 239 msec    Systemic VTI:  0.24 m MV E velocity: 74.90 cm/s  Systemic Diam: 1.80 cm MV A velocity: 85.90 cm/s MV E/A ratio:  0.87 Armanda Magic MD Electronically signed by Armanda Magic MD Signature Date/Time: 10/10/2023/7:15:27 PM    Final    MR BRAIN WO CONTRAST  Result Date: 10/10/2023 CLINICAL DATA:  Stroke, follow-up. EXAM: MRI HEAD WITHOUT CONTRAST TECHNIQUE: Multiplanar, multiecho pulse sequences of the brain and surrounding structures were obtained without intravenous contrast. COMPARISON:  Head CT October 10, 2023. FINDINGS: Brain: Focus of acute intraparenchymal hemorrhage centered in the posterior limb of the right internal capsule, extending into the putamen thalamus, measuring approximately 14 x 10 mm,  correlating with findings described on prior head CT. There is no significant mass effect or midline shift. No hydrocephalus. Scattered and confluent foci of T2 hyperintensity are seen within the white matter of the cerebral hemispheres and within the pons, nonspecific. Remote infarct in the right middle cerebellar peduncle/right cerebellar hemisphere. Vascular: Normal flow voids. Skull and upper cervical spine: Normal marrow signal. Sinuses/Orbits: Negative. Other: None. IMPRESSION: 1. Focus of acute intraparenchymal hemorrhage centered in the posterior limb of the right internal capsule, extending into the putamen and thalamus, correlating with findings described on prior head CT. No significant mass effect or midline shift. 2. Moderate chronic microvascular ischemic changes of the white matter. 3. Remote infarct in the right middle cerebellar peduncle/right cerebellar hemisphere. Electronically Signed   By: Baldemar Lenis M.D.   On: 10/10/2023 15:35   CT ANGIO HEAD NECK W WO CM (CODE STROKE)  Result Date: 10/10/2023 CLINICAL DATA:  Neuro deficit, acute stroke suspected. left-sided weakness. EXAM:  CT ANGIOGRAPHY HEAD AND NECK WITH AND WITHOUT CONTRAST TECHNIQUE: Multidetector CT imaging of the head and neck was performed using the standard protocol during bolus administration of intravenous contrast. Multiplanar CT image reconstructions and MIPs were obtained to evaluate the vascular anatomy. Carotid stenosis measurements (when applicable) are obtained utilizing NASCET criteria, using the distal internal carotid diameter as the denominator. RADIATION DOSE REDUCTION: This exam was performed according to the departmental dose-optimization program which includes automated exposure control, adjustment of the mA and/or kV according to patient size and/or use of iterative reconstruction technique. CONTRAST:  75mL OMNIPAQUE IOHEXOL 350 MG/ML SOLN COMPARISON:  Same day CT head FINDINGS: CTA NECK  FINDINGS Aortic Vargas: Standard branching. Imaged portion shows no evidence of aneurysm or dissection. No significant stenosis of the major Vargas vessel origins. Right carotid system: No evidence of dissection, stenosis (50% or greater), or occlusion. Left carotid system: No evidence of dissection, stenosis (50% or greater), or occlusion. Vertebral arteries: Codominant. No evidence of dissection, stenosis (50% or greater), or occlusion. Skeleton: Negative. Other neck: Negative. Upper chest: Negative. Review of the MIP images confirms the above findings CTA HEAD FINDINGS Anterior circulation: No significant stenosis, proximal occlusion, aneurysm, or vascular malformation. Posterior circulation: No significant stenosis, proximal occlusion, aneurysm, or vascular malformation. Venous sinuses: As permitted by contrast timing, patent. Review of the MIP images confirms the above findings IMPRESSION: 1. No large vessel occlusion proximal hemodynamically significant stenosis. 2. No visible aneurysm or arteriovenous malformation. Electronically Signed   By: Feliberto Harts M.D.   On: 10/10/2023 08:38   CT HEAD CODE STROKE WO CONTRAST  Result Date: 10/10/2023 CLINICAL DATA:  Code stroke.  Neuro deficit, acute, stroke suspected EXAM: CT HEAD WITHOUT CONTRAST TECHNIQUE: Contiguous axial images were obtained from the base of the skull through the vertex without intravenous contrast. RADIATION DOSE REDUCTION: This exam was performed according to the departmental dose-optimization program which includes automated exposure control, adjustment of the mA and/or kV according to patient size and/or use of iterative reconstruction technique. COMPARISON:  None Available. FINDINGS: Brain: Acute 1.4 x 1.2 x 1.7 cm (estimated volume of 1.4 mL) intraparenchymal hemorrhage centered in the posterior limb of the right internal capsule. Mild surrounding edema. No substantial mass effect. No midline shift. No hydrocephalus. No mass lesion.  Vascular: No hyperdense vessel identified. Skull: No acute fracture. Sinuses/Orbits: Clear sinuses.  No acute orbital findings. Other: No mastoid effusions. ASPECTS [facility] Stroke Program Early CT Score) Total score (0-10 with 10 being normal): 10. IMPRESSION: 1. Acute 1.7 cm intraparenchymal hemorrhage centered in the posterior limb of the right internal capsule. No substantial mass effect. Findings discussed with Dr. Ezzie Dural via telephone at 8:24 AM. Electronically Signed   By: Feliberto Harts M.D.   On: 10/10/2023 08:25    Vitals:   10/11/23 0700 10/11/23 0715 10/11/23 0730 10/11/23 0745  BP:  (!) 145/83 (!) 153/74 (!) 141/70  Pulse: 81 79 80 77  Resp: (!) 21 (!) 24 (!) 26 (!) 24  Temp:      TempSrc:      SpO2: 95% 95% 95% 96%  Weight:         PHYSICAL EXAM General:  Alert, well-nourished, well-developed patient in no acute distress Psych:  Mood and affect appropriate for situation CV: Regular rate and rhythm on monitor Respiratory:  Regular, unlabored respirations on room air GI: Abdomen soft and nontender   Neuro: Mental Status: Patient is awake, alert, oriented to person, place, month, year, and situation. Patient is able  to give a clear and coherent history. No signs of aphasia or neglect. Dysarthria present (patient states this is baseline) Cranial Nerves: II: Visual Fields are full. Pupils are equal, round, and reactive to light.   III,IV, VI: EOMI without ptosis or diploplia.  V: Facial sensation is symmetric to temperature VII: Facial movement is symmetric.  VIII: hearing is intact to voice X: Uvula elevates symmetrically XI: Shoulder shrug is symmetric. XII: tongue is midline without atrophy or fasciculations.  Motor: Tone is normal. Bulk is normal. LUE: 4/5 with mild drift LLE: 4/5 with mild drift RUE: 5/5, no drift RLE: 5/5, no drift.  Sensory: Sensation is symmetric to light touch and temperature in the arms and legs. Cerebellar: Ataxia present in left arm  and leg  Most Recent NIH 4     ASSESSMENT/PLAN  Intracerebral Hemorrhage:  Right internal capsule hemorrhage Etiology:  hypertensive  Code Stroke CT head - Acute 1.7 cm intraparenchymal hemorrhage centered in the posterior limb of the right internal capsule. No substantial mass effect. CTA head & neck - No LVO  MRI  Focus of acute intraparenchymal hemorrhage centered in the posterior limb of the right internal capsule, extending into the putamen and thalamus 2D Echo EF 60-65%  LDL 124 HgbA1c 6.0 VTE prophylaxis - subcutaneous heparin  No antithrombotic prior to admission, now on No antithrombotic due to ICH Therapy recommendations:  CIR Disposition:  transfer out of ICU today  Hypertension Home meds:  hydralazine, irbesartan - resumed. Hx of noncompliance Stable Blood Pressure Goal: SBP between 130-150 for 24 hours and then less than 160  Cleviprex off   Hyperlipidemia LDL 124, goal < 70 Add atorvastatin 40mg  Continue statin at discharge  Other Stroke Risk Factors ETOH use, alcohol level <10, advised to drink no more than 1 drink(s) a day  Other Active Problems Left ankle pain XR negative for fracture, does have swelling PRN percocet for 3 doses  Anxiety COPD CKD  Cr at baseline   Hospital day # 1  Patient seen and examined by NP/APP with MD. MD to update note as needed.   Elmer Picker, DNP, FNP-BC [facility] Pager: [phone]  STROKE MD NOTE :  I have personally obtained history,examined this patient, reviewed notes, independently viewed imaging studies, participated in medical decision making and plan of care.ROS completed by me personally and pertinent positives fully documented  I have made any additions or clarifications directly to the above note. Agree with note above.  Patient presented with left hemiparesis due to small right internal capsule basal ganglia hemorrhage due to uncontrolled hypertension.  She remains at risk for hematoma  expansion and neurological worsening.  Continue close neurological observation and strict blood pressure control with systolic blood pressure goal 130--150 for the first any 4 hours and then below 160.  Mobilize out of bed.  Physical occupational and speech therapy consults.This patient is critically ill and at significant risk of neurological worsening, death and care requires constant monitoring of vital signs, hemodynamics,respiratory and cardiac monitoring, extensive review of multiple databases, frequent neurological assessment, discussion with family, other specialists and medical decision making of high complexity.I have made any additions or clarifications directly to the above note.This critical care time does not reflect procedure time, or teaching time or supervisory time of PA/NP/Med Resident etc but could involve care discussion time.  I spent 30 minutes of neurocritical care time  in the care of  this patient.      Delia Heady, MD Medical Director Patrcia Dolly  [facility] Pager: [phone] 10/11/2023 1:59 PM

## 2023-10-11 NOTE — Progress Notes (Signed)
  Inpatient Rehabilitation Admissions Coordinator   Met with patient at bedside for rehab assessment. We discussed goals and expectations of a possible CIR admit.She prefers brief CIR admit and then to d/c home. She has a new kitten and wants to get home asap. She is hopeful to reach Mod I level as to go home with intermittent assist of her daughter . I will ask Rehab MD to consult and then begin Auth with [facility] medicare for possible CIR admit. Please call me with any questions.   Ottie Glazier, RN, MSN Rehab Admissions Coordinator [phone]

## 2023-10-11 NOTE — TOC CM/SW Note (Signed)
Transition of Care [facility]) - Inpatient Brief Assessment   Patient Details  Name: Kathy Vargas MRN: [id number] Date of Birth: August 28, 1951  [facility] of [facility] CM/SW Contact:    Mearl Latin, LCSW Phone Number: 10/11/2023, 11:01 AM   Clinical Narrative: Patient admitted from home with stroke symptoms where she was independent prior to admission and working. No current TOC needs identified but will follow for therapy recommendations.    Transition of Care Asessment: Insurance and Status: Insurance coverage has been reviewed Patient has primary care physician: Yes Home environment has been reviewed: From home Prior level of function:: Independent Prior/Current Home Services: No current home services Social Determinants of Health Reivew: SDOH reviewed no interventions necessary Readmission risk has been reviewed: Yes Transition of care needs: transition of care needs identified, TOC will continue to follow

## 2023-10-12 ENCOUNTER — Encounter (HOSPITAL_COMMUNITY): Payer: Self-pay | Admitting: Physical Medicine and Rehabilitation

## 2023-10-12 ENCOUNTER — Other Ambulatory Visit: Payer: Self-pay

## 2023-10-12 ENCOUNTER — Inpatient Hospital Stay (HOSPITAL_COMMUNITY)
Admission: AD | Admit: 2023-10-12 | Discharge: 2023-10-20 | DRG: 057 | Disposition: A | Discharge status: Home Health | Payer: Medicare Other | Source: Intra-hospital | Attending: Physical Medicine and Rehabilitation | Admitting: Physical Medicine and Rehabilitation

## 2023-10-12 ENCOUNTER — Encounter (HOSPITAL_COMMUNITY): Payer: Self-pay | Admitting: Neurology

## 2023-10-12 DIAGNOSIS — I610 Nontraumatic intracerebral hemorrhage in hemisphere, subcortical: Secondary | ICD-10-CM

## 2023-10-12 DIAGNOSIS — S61412D Laceration without foreign body of left hand, subsequent encounter: Secondary | ICD-10-CM

## 2023-10-12 DIAGNOSIS — K5900 Constipation, unspecified: Secondary | ICD-10-CM | POA: Diagnosis not present

## 2023-10-12 DIAGNOSIS — Z8249 Family history of ischemic heart disease and other diseases of the circulatory system: Secondary | ICD-10-CM

## 2023-10-12 DIAGNOSIS — N179 Acute kidney failure, unspecified: Secondary | ICD-10-CM | POA: Diagnosis present

## 2023-10-12 DIAGNOSIS — J449 Chronic obstructive pulmonary disease, unspecified: Secondary | ICD-10-CM | POA: Diagnosis present

## 2023-10-12 DIAGNOSIS — Z91148 Patient's other noncompliance with medication regimen for other reason: Secondary | ICD-10-CM

## 2023-10-12 DIAGNOSIS — Z90710 Acquired absence of both cervix and uterus: Secondary | ICD-10-CM

## 2023-10-12 DIAGNOSIS — Z7951 Long term (current) use of inhaled steroids: Secondary | ICD-10-CM | POA: Diagnosis not present

## 2023-10-12 DIAGNOSIS — Z9841 Cataract extraction status, right eye: Secondary | ICD-10-CM

## 2023-10-12 DIAGNOSIS — I611 Nontraumatic intracerebral hemorrhage in hemisphere, cortical: Secondary | ICD-10-CM

## 2023-10-12 DIAGNOSIS — I69328 Other speech and language deficits following cerebral infarction: Secondary | ICD-10-CM | POA: Diagnosis not present

## 2023-10-12 DIAGNOSIS — N189 Chronic kidney disease, unspecified: Secondary | ICD-10-CM | POA: Diagnosis not present

## 2023-10-12 DIAGNOSIS — E039 Hypothyroidism, unspecified: Secondary | ICD-10-CM | POA: Diagnosis not present

## 2023-10-12 DIAGNOSIS — F17210 Nicotine dependence, cigarettes, uncomplicated: Secondary | ICD-10-CM | POA: Diagnosis not present

## 2023-10-12 DIAGNOSIS — Z823 Family history of stroke: Secondary | ICD-10-CM | POA: Diagnosis not present

## 2023-10-12 DIAGNOSIS — Z87442 Personal history of urinary calculi: Secondary | ICD-10-CM | POA: Diagnosis not present

## 2023-10-12 DIAGNOSIS — Z885 Allergy status to narcotic agent status: Secondary | ICD-10-CM

## 2023-10-12 DIAGNOSIS — G4700 Insomnia, unspecified: Secondary | ICD-10-CM | POA: Diagnosis present

## 2023-10-12 DIAGNOSIS — Z881 Allergy status to other antibiotic agents status: Secondary | ICD-10-CM | POA: Diagnosis not present

## 2023-10-12 DIAGNOSIS — N1832 Chronic kidney disease, stage 3b: Secondary | ICD-10-CM | POA: Diagnosis present

## 2023-10-12 DIAGNOSIS — K219 Gastro-esophageal reflux disease without esophagitis: Secondary | ICD-10-CM | POA: Diagnosis present

## 2023-10-12 DIAGNOSIS — Z818 Family history of other mental and behavioral disorders: Secondary | ICD-10-CM | POA: Diagnosis not present

## 2023-10-12 DIAGNOSIS — I69154 Hemiplegia and hemiparesis following nontraumatic intracerebral hemorrhage affecting left non-dominant side: Secondary | ICD-10-CM | POA: Diagnosis not present

## 2023-10-12 DIAGNOSIS — F411 Generalized anxiety disorder: Secondary | ICD-10-CM | POA: Diagnosis not present

## 2023-10-12 DIAGNOSIS — I619 Nontraumatic intracerebral hemorrhage, unspecified: Principal | ICD-10-CM | POA: Diagnosis present

## 2023-10-12 DIAGNOSIS — Z79899 Other long term (current) drug therapy: Secondary | ICD-10-CM | POA: Diagnosis not present

## 2023-10-12 DIAGNOSIS — Z9842 Cataract extraction status, left eye: Secondary | ICD-10-CM

## 2023-10-12 DIAGNOSIS — I129 Hypertensive chronic kidney disease with stage 1 through stage 4 chronic kidney disease, or unspecified chronic kidney disease: Secondary | ICD-10-CM | POA: Diagnosis present

## 2023-10-12 DIAGNOSIS — E860 Dehydration: Secondary | ICD-10-CM | POA: Diagnosis not present

## 2023-10-12 DIAGNOSIS — Z961 Presence of intraocular lens: Secondary | ICD-10-CM | POA: Diagnosis present

## 2023-10-12 DIAGNOSIS — I161 Hypertensive emergency: Secondary | ICD-10-CM | POA: Diagnosis present

## 2023-10-12 DIAGNOSIS — F1011 Alcohol abuse, in remission: Secondary | ICD-10-CM | POA: Diagnosis present

## 2023-10-12 DIAGNOSIS — S93402D Sprain of unspecified ligament of left ankle, subsequent encounter: Secondary | ICD-10-CM

## 2023-10-12 DIAGNOSIS — Z9049 Acquired absence of other specified parts of digestive tract: Secondary | ICD-10-CM | POA: Diagnosis not present

## 2023-10-12 DIAGNOSIS — I10 Essential (primary) hypertension: Secondary | ICD-10-CM | POA: Diagnosis not present

## 2023-10-12 DIAGNOSIS — K5901 Slow transit constipation: Secondary | ICD-10-CM | POA: Diagnosis not present

## 2023-10-12 MED ORDER — CALCIUM CARBONATE ANTACID 500 MG PO CHEW: 1.0000 | CHEWABLE_TABLET | Freq: Every day | ORAL | Status: DC

## 2023-10-12 MED ORDER — NICOTINE 21 MG/24HR TD PT24
21.0000 mg | MEDICATED_PATCH | Freq: Every day | TRANSDERMAL | Status: DC
  Administered 2023-10-12 – 2023-10-20 (×9): 21 mg via TRANSDERMAL
  Filled 2023-10-12 (×9): qty 1

## 2023-10-12 MED ORDER — HYDRALAZINE HCL 50 MG PO TABS
100.0000 mg | ORAL_TABLET | Freq: Three times a day (TID) | ORAL | Status: DC
  Administered 2023-10-12 – 2023-10-20 (×23): 100 mg via ORAL
  Filled 2023-10-12 (×23): qty 2

## 2023-10-12 MED ORDER — OXYCODONE-ACETAMINOPHEN 5-325 MG PO TABS
1.0000 | ORAL_TABLET | Freq: Four times a day (QID) | ORAL | Status: AC | PRN
  Administered 2023-10-13: 1 via ORAL
  Filled 2023-10-12: qty 1

## 2023-10-12 MED ORDER — HYDRALAZINE HCL 100 MG PO TABS: 100.0000 mg | ORAL_TABLET | Freq: Three times a day (TID) | ORAL | Status: DC

## 2023-10-12 MED ORDER — NICOTINE 21 MG/24HR TD PT24: 21.0000 mg | MEDICATED_PATCH | Freq: Every day | TRANSDERMAL | Status: DC

## 2023-10-12 MED ORDER — HEPARIN SODIUM (PORCINE) 5000 UNIT/ML IJ SOLN: 5000.0000 [IU] | Freq: Three times a day (TID) | INTRAMUSCULAR | Status: DC

## 2023-10-12 MED ORDER — HYDRALAZINE HCL 50 MG PO TABS
100.0000 mg | ORAL_TABLET | Freq: Three times a day (TID) | ORAL | Status: DC
  Administered 2023-10-12 (×2): 100 mg via ORAL
  Filled 2023-10-12 (×2): qty 2

## 2023-10-12 MED ORDER — ONDANSETRON HCL 4 MG PO TABS: 4.0000 mg | ORAL_TABLET | Freq: Four times a day (QID) | ORAL | Status: DC | PRN

## 2023-10-12 MED ORDER — PANTOPRAZOLE SODIUM 40 MG PO TBEC
40.0000 mg | DELAYED_RELEASE_TABLET | Freq: Every day | ORAL | Status: DC
  Administered 2023-10-12 – 2023-10-19 (×8): 40 mg via ORAL
  Filled 2023-10-12 (×8): qty 1

## 2023-10-12 MED ORDER — BISACODYL 5 MG PO TBEC: 5.0000 mg | DELAYED_RELEASE_TABLET | Freq: Every day | ORAL | Status: DC | PRN

## 2023-10-12 MED ORDER — ONDANSETRON HCL 4 MG/2ML IJ SOLN: 4.0000 mg | Freq: Four times a day (QID) | INTRAMUSCULAR | Status: DC | PRN

## 2023-10-12 MED ORDER — ALPRAZOLAM 0.5 MG PO TABS
0.5000 mg | ORAL_TABLET | Freq: Two times a day (BID) | ORAL | Status: DC | PRN
  Administered 2023-10-12: 0.5 mg via ORAL
  Filled 2023-10-12: qty 1

## 2023-10-12 MED ORDER — SENNOSIDES-DOCUSATE SODIUM 8.6-50 MG PO TABS
1.0000 | ORAL_TABLET | Freq: Two times a day (BID) | ORAL | Status: DC
Start: 2023-10-12 — End: 2023-10-14
  Administered 2023-10-12 – 2023-10-14 (×4): 1 via ORAL
  Filled 2023-10-12 (×4): qty 1

## 2023-10-12 MED ORDER — ALUM & MAG HYDROXIDE-SIMETH 200-200-20 MG/5ML PO SUSP
30.0000 mL | ORAL | Status: DC | PRN
Start: 2023-10-12 — End: 2023-10-20

## 2023-10-12 MED ORDER — METHOCARBAMOL 500 MG PO TABS
500.0000 mg | ORAL_TABLET | Freq: Four times a day (QID) | ORAL | Status: DC | PRN
  Administered 2023-10-13: 500 mg via ORAL
  Filled 2023-10-12: qty 1

## 2023-10-12 MED ORDER — DIPHENHYDRAMINE HCL 25 MG PO CAPS
25.0000 mg | ORAL_CAPSULE | Freq: Four times a day (QID) | ORAL | Status: DC | PRN
  Administered 2023-10-15: 25 mg via ORAL
  Filled 2023-10-12: qty 1

## 2023-10-12 MED ORDER — HEPARIN SODIUM (PORCINE) 5000 UNIT/ML IJ SOLN
5000.0000 [IU] | Freq: Three times a day (TID) | INTRAMUSCULAR | Status: DC
  Administered 2023-10-12 – 2023-10-20 (×23): 5000 [IU] via SUBCUTANEOUS
  Filled 2023-10-12 (×23): qty 1

## 2023-10-12 MED ORDER — FLEET ENEMA RE ENEM: 1.0000 | ENEMA | Freq: Once | RECTAL | Status: DC | PRN

## 2023-10-12 MED ORDER — GUAIFENESIN-DM 100-10 MG/5ML PO SYRP: 10.0000 mL | ORAL_SOLUTION | Freq: Four times a day (QID) | ORAL | Status: DC | PRN

## 2023-10-12 MED ORDER — ACETAMINOPHEN 325 MG PO TABS
325.0000 mg | ORAL_TABLET | ORAL | Status: DC | PRN
  Administered 2023-10-15: 650 mg via ORAL
  Filled 2023-10-12 (×3): qty 2

## 2023-10-12 MED ORDER — CALCIUM CARBONATE ANTACID 500 MG PO CHEW
1.0000 | CHEWABLE_TABLET | Freq: Every day | ORAL | Status: DC
  Administered 2023-10-13 – 2023-10-18 (×6): 200 mg via ORAL
  Filled 2023-10-12 (×8): qty 1

## 2023-10-12 MED ORDER — IRBESARTAN 150 MG PO TABS
150.0000 mg | ORAL_TABLET | Freq: Every day | ORAL | Status: DC
  Administered 2023-10-13 – 2023-10-16 (×4): 150 mg via ORAL
  Filled 2023-10-12 (×4): qty 1

## 2023-10-12 MED ORDER — MAGNESIUM HYDROXIDE 400 MG/5ML PO SUSP
30.0000 mL | Freq: Every day | ORAL | Status: DC | PRN
Start: 2023-10-12 — End: 2023-10-20

## 2023-10-12 MED ORDER — ACETAMINOPHEN 325 MG PO TABS: 650.0000 mg | ORAL_TABLET | ORAL | Status: DC | PRN

## 2023-10-12 NOTE — Progress Notes (Signed)
Physical Therapy Treatment Patient Details Name: Kathy Vargas MRN: [id number] DOB: [birth year] Today's Date: 10/12/2023   History of Present Illness Patient is a 72 y/o female admitted 10/10/23 with L side weakness and ataxia.  Found to have SBP in 200's and CT found to have subcortical ICH on head CT. L ankle xray negative for fx. PMH positive for HTN, chronic pain, anxiety, COPD, DJD, depression, headaches, ETOH abuse (in remission), and arthritis.    PT Comments  Pt reports feeling better today, still complaining of L ankle pain from fall. Pt tolerated short-distance gait in room, pt limited by L ankle pain and LLE weakness/incoordination. Pt requesting pain medication at end of session given moderate ankle pain, RN aware. PT to continue to follow, plan remains appropriate.     If plan is discharge home, recommend the following: A little help with walking and/or transfers;Direct supervision/assist for medications management;Supervision due to cognitive status;Assistance with cooking/housework;Assist for transportation;A little help with bathing/dressing/bathroom   Can travel by private vehicle        Equipment Recommendations  Rolling walker (2 wheels);BSC/3in1    Recommendations for Other Services       Precautions / Restrictions Precautions Precautions: Fall Precaution Comments: SBP < 160; ace wrapped L ankle given L ankle pain resulting from fall during ICH Restrictions Weight Bearing Restrictions: No     Mobility  Bed Mobility Overal bed mobility: Needs Assistance Bed Mobility: Supine to Sit, Sit to Supine     Supine to sit: Min assist, HOB elevated Sit to supine: Min assist, HOB elevated   General bed mobility comments: assist for LE lift into bed. pt able to pull to long sitting and scoot self back once return to supine    Transfers Overall transfer level: Needs assistance Equipment used: Rolling walker (2 wheels) Transfers: Sit to/from Stand Sit to Stand:  Min assist           General transfer comment: assist for rise and steady, cues for correct hand placement    Ambulation/Gait Ambulation/Gait assistance: Min assist Gait Distance (Feet): 10 Feet Assistive device: Rolling walker (2 wheels) Gait Pattern/deviations: Knee hyperextension - left, Decreased weight shift to left, Trunk flexed, Ataxic Gait velocity: decr     General Gait Details: assist for steadying, managing RW. Pt reporting severe L ankle pain with WB, cues for offweighting with use of RW   Stairs             Wheelchair Mobility     Tilt Bed    Modified Rankin (Stroke Patients Only) Modified Rankin (Stroke Patients Only) Pre-Morbid Rankin Score: No symptoms Modified Rankin: Moderately severe disability     Balance Overall balance assessment: Needs assistance Sitting-balance support: No upper extremity supported, Feet supported Sitting balance-Leahy Scale: Fair     Standing balance support: Bilateral upper extremity supported, Reliant on assistive device for balance, During functional activity Standing balance-Leahy Scale: Poor                              Cognition Arousal: Alert Behavior During Therapy: WFL for tasks assessed/performed Overall Cognitive Status: Impaired/Different from baseline Area of Impairment: Safety/judgement, Problem solving, Attention                   Current Attention Level: Selective     Safety/Judgement: Decreased awareness of deficits   Problem Solving: Slow processing          Exercises Total Joint Exercises  Ankle Circles/Pumps: AROM, 10 reps, Supine, Left    General Comments        Pertinent Vitals/Pain Pain Assessment Pain Assessment: Faces Faces Pain Scale: Hurts little more Pain Location: L ankle>thigh Pain Descriptors / Indicators: Aching, Constant Pain Intervention(s): Limited activity within patient's tolerance, Monitored during session, Repositioned    Home Living                           Prior Function            PT Goals (current goals can now be found in the care plan section) Acute Rehab PT Goals Patient Stated Goal: to return home PT Goal Formulation: With patient/family Time For Goal Achievement: 10/25/23 Potential to Achieve Goals: Good Progress towards PT goals: Progressing toward goals    Frequency    Min 1X/week      PT Plan      Co-evaluation              AM-PAC PT "6 Clicks" Mobility   Outcome Measure  Help needed turning from your back to your side while in a flat bed without using bedrails?: A Little Help needed moving from lying on your back to sitting on the side of a flat bed without using bedrails?: A Little Help needed moving to and from a bed to a chair (including a wheelchair)?: A Little Help needed standing up from a chair using your arms (e.g., wheelchair or bedside chair)?: A Little Help needed to walk in hospital room?: A Lot Help needed climbing 3-5 steps with a railing? : Total 6 Click Score: 15    End of Session Equipment Utilized During Treatment: Gait belt Activity Tolerance: Patient limited by pain Patient left: in chair;with call bell/phone within reach;with chair alarm set;with family/visitor present Nurse Communication: Mobility status PT Visit Diagnosis: Ataxic gait (R26.0);Other abnormalities of gait and mobility (R26.89);Other symptoms and signs involving the nervous system (R29.898)     Time: 1208-1222 PT Time Calculation (min) (ACUTE ONLY): 14 min  Charges:    $Therapeutic Activity: 8-22 mins PT General Charges $$ ACUTE PT VISIT: 1 Visit                     Marye Round, PT DPT Acute Rehabilitation Services Secure Chat Preferred  Office [phone]    Riah Kehoe Sheliah Plane 10/12/2023, 2:39 PM

## 2023-10-12 NOTE — Progress Notes (Signed)
Kathy Dance, MD  Physician Physical Medicine and Rehabilitation   Consult Note     Signed   Date of Service: 10/11/2023  1:43 PM  Related encounter: ED to Hosp-Admission (Current) from 10/10/2023 in [facility] NEURO/TRAUMA/SURGICAL ICU   Signed     Expand All Collapse All  Show:Clear all [x] Written[x] Templated[] Copied  Added by: [x] Kathy Dance, MD  [] Hover for details          Physical Medicine and Rehabilitation Consult Reason for Consult: Rehab  Referring Physician: Dr. Pamalee Leyden     HPI: Kathy Vargas is a 72 y.o. female with PMH of HTN, HLD, DJD, anxiety, COPD, CKD, alcohol abuse in remission, depression, headaches who presented to the hospital with left-sided weakness that she noticed while driving to work.  She had slurred speech but reports this is baseline.  BP was elevated and medications were started.  CT head showed 1.7 cm intraparenchymal hemorrhage centered in the posterior limb of the right internal capsule.  CTA head and neck with no large vessel occlusion or significant stenosis.  Chart review indicates patient's daughter reported she does not like to take her blood pressure medicine.  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 Patient reports L ankle pain and swelling as she twisted her ankle getting out of her car.  Xray ankle negative for fracture.  Seen by physical therapy, requiring mod assist for step pivot transfers   She has a apartment with 5 steps to enter. If she needs to she can also stay with daughter.    Review of Systems  Constitutional:  Negative for chills and fever.  HENT:  Positive for hearing loss (chronic L ear). Negative for congestion.   Eyes:  Negative for blurred vision and double vision.   Respiratory:  Negative for sputum production and shortness of breath.   Cardiovascular:  Negative for chest pain and palpitations.  Gastrointestinal:  Positive for constipation. Negative for abdominal pain, diarrhea, nausea and vomiting.  Genitourinary: Negative.   Musculoskeletal:  Positive for joint pain.  Skin:  Negative for rash.  Neurological:  Positive for focal weakness. Negative for sensory change and headaches.        Past Medical History:  Diagnosis Date   ABDOMINAL TENDERNESS 01/21/2011   ACUTE GINGIVITIS NONPLAQUE INDUCED 06/11/2010   ADD 09/05/2008   Alcohol abuse, in remission 09/27/2007   ALLERGIC RHINITIS 03/02/2009   Anemia     ANXIETY 07/02/2007   BACK PAIN 05/19/2009   CHRONIC OBSTRUCTIVE PULMONARY DISEASE, ACUTE EXACERBATION 08/06/2009   COPD 07/02/2007   DEGENERATIVE JOINT DISEASE, CERVICAL SPINE 07/02/2007   DEPRESSION 07/02/2007   Dyspnea     GASTROENTERITIS, VIRAL 11/29/2010   GERD 01/21/2011   Headache(784.0) 07/02/2007   History of kidney stones     Hypertension     HYPOTHYROIDISM 07/02/2007   Lumbar disc disease     Lumbar pain with radiation down right leg 06/01/2011   OSTEOARTHRITIS 07/02/2007   Other and unspecified ovarian cyst 05/19/2009   PAIN, CHRONIC, DUE TO TRAUMA 07/02/2007   PELVIC  PAIN 03/29/2010   UTI 05/19/2009   Vitamin D insufficiency               Past Surgical History:  Procedure Laterality Date   ABDOMINAL HYSTERECTOMY       CATARACT EXTRACTION W/ INTRAOCULAR LENS IMPLANT Bilateral  CHOLECYSTECTOMY       CYSTOSCOPY/URETEROSCOPY/HOLMIUM LASER/STENT PLACEMENT Left 07/01/2022    Procedure: CYSTOSCOPY/ RETROGRADE/URETEROSCOPY/STENT PLACEMENT;  Surgeon: Jannifer Hick, MD;  Location: [facility];  Service: Urology;  Laterality: Left;   eyebrow lift       IR URETERAL STENT LEFT NEW ACCESS W/O SEP NEPHROSTOMY CATH   07/01/2022   LIPOSUCTION       LUMBAR DISC SURGERY        L4, L5   NEPHROLITHOTOMY Left 07/01/2022     Procedure: NEPHROLITHOTOMY PERCUTANEOUS/ INTERVENTIONAL RADIOLOGY TO PLACE LEFT NEPHROSTOMY TUBE PRIOR;  Surgeon: Jannifer Hick, MD;  Location: [facility];  Service: Urology;  Laterality: Left;  ONLY NEEDS 120 MIN FOR ALL PROCEDURES   URETEROSCOPY WITH HOLMIUM LASER LITHOTRIPSY Right 04/12/2022    Procedure: URETEROSCOPY WITH HOLMIUM LASER LITHOTRIPSY, STENT PLACEMENT;  Surgeon: Despina Arias, MD;  Location: [facility];  Service: Urology;  Laterality: Right;             Family History  Problem Relation Age of Onset   Alcohol abuse Other     Anxiety disorder Other     Coronary artery disease Other     Depression Other     Stroke Other          Social History:  reports that she has been smoking cigarettes. She started smoking about 60 years ago. She has a 30.4 pack-year smoking history. She has never used smokeless tobacco. She reports that she does not currently use alcohol. She reports that she does not use drugs. Allergies:  Allergies       Allergies  Allergen Reactions   Morphine Nausea And Vomiting   Ceftin [Cefuroxime]        Nausea and vomiting            Medications Prior to Admission  Medication Sig Dispense Refill   ALPRAZolam (XANAX) 0.5 MG tablet Take 1 tablet (0.5 mg) by mouth 2 times daily as needed. 60 tablet 5   amphetamine-dextroamphetamine (ADDERALL) 30 MG tablet Take 1 tablet by mouth 2 (two) times daily. 60 tablet 0   Biotin w/ Vitamins C & E (HAIR/SKIN/NAILS PO) Take 1 tablet by mouth daily.       Calcium-Magnesium-Zinc (CAL-MAG-ZINC PO) Take 1 tablet by mouth at bedtime.       Cholecalciferol (VITAMIN D3 PO) Take 1 tablet by mouth in the morning.       cyanocobalamin (VITAMIN B12) 1000 MCG tablet Take 1 tablet (1,000 mcg total) by mouth daily. 90 tablet 3   fluticasone-salmeterol (ADVAIR DISKUS) 250-50 MCG/ACT AEPB Inhale 1 puff into the lungs 2 (two) times daily as needed (Asthma). 180 each 3   hydrALAZINE (APRESOLINE) 50 MG tablet Take 1 tablet (50 mg total) by  mouth 3 (three) times daily. 90 tablet 1   irbesartan (AVAPRO) 150 MG tablet Take 1 tablet (150 mg total) by mouth daily. 90 tablet 3   pantoprazole (PROTONIX) 40 MG tablet Take 1 tablet (40 mg total) by mouth daily. 90 tablet 3   fluticasone (CUTIVATE) 0.005 % ointment Apply to the skin 2 times daily if needed 30 g 1          Home: Home Living Family/patient expects to be discharged to:: Private residence Living Arrangements: Alone Available Help at Discharge: Family, Available 24 hours/day Type of Home: Apartment Home Access: Stairs to enter [employer] of Steps: 5 Entrance Stairs-Rails: Right, Can reach both, Left Home Layout: One level Bathroom Shower/Tub: Tub/shower unit Home  Equipment: Grab bars - tub/shower  Lives With: Alone  Functional History: Prior Function Prior Level of Function : Independent/Modified Independent, Driving 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 Functional Status:  Mobility: Bed Mobility Overal bed mobility: Needs Assistance Bed Mobility: Supine to Sit Supine to sit: HOB elevated, Used rails Transfers Overall transfer level: Needs assistance Equipment used: Rolling walker (2 wheels) Transfers: Sit to/from Stand, Bed to chair/wheelchair/BSC Sit to Stand: Min assist Bed to/from chair/wheelchair/BSC transfer type:: Step pivot Step pivot transfers: Mod assist General transfer comment: some assist to stand for balance and due to pain L ankle; stepping to recliner with RW and A for balance and cues for UE weight bearing and encouragement due to pain L foot Ambulation/Gait General Gait Details: unable due to pain, limited coordination/strength/sensation L LE   ADL:   Cognition: Cognition Overall Cognitive Status: Impaired/Different from baseline Orientation Level: Oriented X4 Attention: Sustained, Selective Sustained Attention: Appears intact Selective Attention: Impaired Selective Attention Impairment:  Verbal basic Memory: Appears intact Awareness: Impaired Awareness Impairment: Emergent impairment Problem Solving: Impaired Problem Solving Impairment: Verbal basic Cognition Arousal: Alert Behavior During Therapy: WFL for tasks assessed/performed Overall Cognitive Status: Impaired/Different from baseline Area of Impairment: Safety/judgement, Problem solving, Attention Current Attention Level: Selective Safety/Judgement: Decreased awareness of deficits Problem Solving: Slow processing General Comments: slower processing, felt L LE deficits all from her ankle injury, also not aware of need for follow up inpatient rehab and tearful when considering.   Blood pressure 137/82, pulse 81, temperature 97.7 F (36.5 C), temperature source Oral, resp. rate (!) 22, weight 55.6 kg, SpO2 96%. Physical Exam       General: No apparent distress HEENT: Head is normocephalic, atraumatic, sclera anicteric, oral mucosa pink and moist, dentition intact Neck: Supple without JVD or lymphadenopathy Heart: Reg rate and rhythm. No murmurs rubs or gallops Chest: CTA bilaterally without wheezes, rales, or rhonchi; no distress Abdomen: Soft, non-tender, non-distended, bowel sounds positive. Extremities: No clubbing, cyanosis, or edema. Pulses are 2+ Psych: Pt's affect is appropriate. Pt is cooperative Skin: Clean and intact without signs of breakdown Neuro:     Mental Status: AAO to person, place, month year and day of the week, not date, memory intact, fund of knowledge appropriate Speech/Languate: Naming and repetition intact, fluent, follows simple commands +dysarthric speech- pt reports this is her baseline CRANIAL NERVES: II: PERRL. Visual fields full III, IV, VI: EOM intact, no gaze preference or deviation V: normal sensation bilaterally VII: no asymmetry VIII: decreased hearing L side- chronic IX, X: normal palatal elevation XI: 5/5 head turn and 5/5 shoulder shrug bilaterally XII: Tongue  midline     MOTOR: RUE: 5/5 Deltoid, 5/5 Biceps, 5/5 Triceps,5/5 Grip LUE: 4+/5 Deltoid, 5/5 Biceps, 5/5 Triceps, 5/5 Grip RLE: HF 5/5, KE 5/5, ADF 5/5, APF 5/5 LLE: HF 4-/5, KE 4/5, ADF 4/5, APF 4/5   SENSORY: Normal to touch all 4 extremities   Coordination: Normal finger to nose and heel to shin, no tremor, no dysmetria  MSK: TTP-greatest lateral L ankle,  swelling L ankle. Ace wrap in place, pain with passive inversion     Lab Results Last 24 Hours       Results for orders placed or performed during the hospital encounter of 10/10/23 (from the past 24 hour(s))  MRSA Next Gen by PCR, Nasal     Status: None    Collection Time: 10/10/23  1:49 PM    Specimen: Nasal Mucosa; Nasal Swab  Result  Value Ref Range    MRSA by PCR Next Gen NOT DETECTED NOT DETECTED  Urinalysis, Routine w reflex microscopic -Urine, Clean Catch     Status: Abnormal    Collection Time: 10/10/23  3:19 PM  Result Value Ref Range    Color, Urine STRAW (A) YELLOW    APPearance CLEAR CLEAR    Specific Gravity, Urine 1.027 1.005 - 1.030    pH 6.0 5.0 - 8.0    Glucose, UA NEGATIVE NEGATIVE mg/dL    Hgb urine dipstick SMALL (A) NEGATIVE    Bilirubin Urine NEGATIVE NEGATIVE    Ketones, ur NEGATIVE NEGATIVE mg/dL    Protein, ur NEGATIVE NEGATIVE mg/dL    Nitrite NEGATIVE NEGATIVE    Leukocytes,Ua LARGE (A) NEGATIVE    RBC / HPF 11-20 0 - 5 RBC/hpf    WBC, UA >50 0 - 5 WBC/hpf    Bacteria, UA NONE SEEN NONE SEEN    Squamous Epithelial / HPF 0-5 0 - 5 /HPF  Rapid urine drug screen (hospital performed)     Status: Abnormal    Collection Time: 10/10/23  3:19 PM  Result Value Ref Range    Opiates NONE DETECTED NONE DETECTED    Cocaine NONE DETECTED NONE DETECTED    Benzodiazepines POSITIVE (A) NONE DETECTED    Amphetamines POSITIVE (A) NONE DETECTED    Tetrahydrocannabinol POSITIVE (A) NONE DETECTED    Barbiturates NONE DETECTED NONE DETECTED       Imaging Results (Last 48 hours)  DG Ankle 2 Views Left    Result Date: 10/10/2023 CLINICAL DATA:  Left ankle pain EXAM: LEFT ANKLE - 2 VIEW COMPARISON:  None Available. FINDINGS: There is no evidence of fracture, dislocation, or joint effusion. There is no evidence of arthropathy or other focal bone abnormality. Soft tissues are unremarkable. IMPRESSION: Negative. Electronically Signed   By: Charlett Nose M.D.   On: 10/10/2023 20:10    ECHOCARDIOGRAM COMPLETE   Result Date: 10/10/2023    ECHOCARDIOGRAM REPORT   Patient Name:   Porshia Omeara Date of Exam: 10/10/2023 Medical Rec #:  [id number]       Height:       65.0 in Accession #:    [id number]      Weight:       122.6 lb Date of Birth:  1951/05/24       BSA:          1.607 m Patient Age:    72 years        BP:           118/72 mmHg Patient Gender: F               HR:           72 bpm. Exam Location:  Inpatient Procedure: 2D Echo, Cardiac Doppler and Color Doppler Indications:    Stroke I63.9  History:        Patient has no prior history of Echocardiogram examinations.                 Stroke and COPD; Risk Factors:Hypertension, Dyslipidemia and                 Current Smoker.  Sonographer:    Dondra Prader RVT RCS Referring Phys: [id number] Lynnae January IMPRESSIONS  1. Left ventricular ejection fraction, by estimation, is 60 to 65%. The left ventricle has normal function. The left ventricle has no regional wall motion abnormalities. Left ventricular diastolic parameters are indeterminate.  2. Right ventricular systolic function is normal. The right ventricular size is normal. Tricuspid regurgitation signal is inadequate for assessing PA pressure.  3. The mitral valve is normal in structure. Trivial mitral valve regurgitation. No evidence of mitral stenosis.  4. The aortic valve is normal in structure. Aortic valve regurgitation is trivial. No aortic stenosis is present.  5. The inferior vena cava is normal in size with greater than 50% respiratory variability, suggesting right atrial pressure of 3 mmHg.  Conclusion(s)/Recommendation(s): No intracardiac source of embolism detected on this transthoracic study. Consider a transesophageal echocardiogram to exclude cardiac source of embolism if clinically indicated. FINDINGS  Left Ventricle: Left ventricular ejection fraction, by estimation, is 60 to 65%. The left ventricle has normal function. The left ventricle has no regional wall motion abnormalities. The left ventricular internal cavity size was normal in size. There is  no left ventricular hypertrophy. Left ventricular diastolic parameters are indeterminate. Normal left ventricular filling pressure. Right Ventricle: The right ventricular size is normal. No increase in right ventricular wall thickness. Right ventricular systolic function is normal. Tricuspid regurgitation signal is inadequate for assessing PA pressure. Left Atrium: Left atrial size was normal in size. Right Atrium: Right atrial size was normal in size. Pericardium: There is no evidence of pericardial effusion. Mitral Valve: The mitral valve is normal in structure. Trivial mitral valve regurgitation. No evidence of mitral valve stenosis. Tricuspid Valve: The tricuspid valve is normal in structure. Tricuspid valve regurgitation is trivial. No evidence of tricuspid stenosis. Aortic Valve: The aortic valve is normal in structure. Aortic valve regurgitation is trivial. No aortic stenosis is present. Aortic valve mean gradient measures 3.0 mmHg. Aortic valve peak gradient measures 5.6 mmHg. Aortic valve area, by VTI measures 2.42 cm. Pulmonic Valve: The pulmonic valve was normal in structure. Pulmonic valve regurgitation is not visualized. No evidence of pulmonic stenosis. Aorta: The aortic root is normal in size and structure. Venous: The inferior vena cava is normal in size with greater than 50% respiratory variability, suggesting right atrial pressure of 3 mmHg. IAS/Shunts: The interatrial septum appears to be lipomatous. No atrial level shunt  detected by color flow Doppler.  LEFT VENTRICLE PLAX 2D LVIDd:         4.00 cm   Diastology LVIDs:         2.70 cm   LV e' medial:    5.77 cm/s LV PW:         1.00 cm   LV E/e' medial:  13.0 LV IVS:        0.90 cm   LV e' lateral:   8.59 cm/s LVOT diam:     1.80 cm   LV E/e' lateral: 8.7 LV SV:         60 LV SV Index:   37 LVOT Area:     2.54 cm  RIGHT VENTRICLE             IVC RV S prime:     12.70 cm/s  IVC diam: 1.30 cm TAPSE (M-mode): 2.3 cm LEFT ATRIUM             Index        RIGHT ATRIUM           Index LA diam:        2.80 cm 1.74 cm/m   RA Area:     10.90 cm LA Vol (A2C):   24.1 ml 15.00 ml/m  RA Volume:   22.20 ml  13.82 ml/m LA Vol (  A4C):   21.2 ml 13.20 ml/m LA Biplane Vol: 24.4 ml 15.19 ml/m  AORTIC VALVE                    PULMONIC VALVE AV Area (Vmax):    2.24 cm     PV Vmax:       0.88 m/s AV Area (Vmean):   2.31 cm     PV Peak grad:  3.1 mmHg AV Area (VTI):     2.42 cm AV Vmax:           118.00 cm/s AV Vmean:          77.800 cm/s AV VTI:            0.248 m AV Peak Grad:      5.6 mmHg AV Mean Grad:      3.0 mmHg LVOT Vmax:         104.00 cm/s LVOT Vmean:        70.700 cm/s LVOT VTI:          0.236 m LVOT/AV VTI ratio: 0.95  AORTA Ao Root diam: 2.80 cm Ao Asc diam:  3.00 cm MITRAL VALVE MV Area (PHT): 3.17 cm    SHUNTS MV Decel Time: 239 msec    Systemic VTI:  0.24 m MV E velocity: 74.90 cm/s  Systemic Diam: 1.80 cm MV A velocity: 85.90 cm/s MV E/A ratio:  0.87 Armanda Magic MD Electronically signed by Armanda Magic MD Signature Date/Time: 10/10/2023/7:15:27 PM    Final     MR BRAIN WO CONTRAST   Result Date: 10/10/2023 CLINICAL DATA:  Stroke, follow-up. EXAM: MRI HEAD WITHOUT CONTRAST TECHNIQUE: Multiplanar, multiecho pulse sequences of the brain and surrounding structures were obtained without intravenous contrast. COMPARISON:  Head CT October 10, 2023. FINDINGS: Brain: Focus of acute intraparenchymal hemorrhage centered in the posterior limb of the right internal capsule, extending  into the putamen thalamus, measuring approximately 14 x 10 mm, correlating with findings described on prior head CT. There is no significant mass effect or midline shift. No hydrocephalus. Scattered and confluent foci of T2 hyperintensity are seen within the white matter of the cerebral hemispheres and within the pons, nonspecific. Remote infarct in the right middle cerebellar peduncle/right cerebellar hemisphere. Vascular: Normal flow voids. Skull and upper cervical spine: Normal marrow signal. Sinuses/Orbits: Negative. Other: None. IMPRESSION: 1. Focus of acute intraparenchymal hemorrhage centered in the posterior limb of the right internal capsule, extending into the putamen and thalamus, correlating with findings described on prior head CT. No significant mass effect or midline shift. 2. Moderate chronic microvascular ischemic changes of the white matter. 3. Remote infarct in the right middle cerebellar peduncle/right cerebellar hemisphere. Electronically Signed   By: Baldemar Lenis M.D.   On: 10/10/2023 15:35    CT ANGIO HEAD NECK W WO CM (CODE STROKE)   Result Date: 10/10/2023 CLINICAL DATA:  Neuro deficit, acute stroke suspected. left-sided weakness. EXAM: CT ANGIOGRAPHY HEAD AND NECK WITH AND WITHOUT CONTRAST TECHNIQUE: Multidetector CT imaging of the head and neck was performed using the standard protocol during bolus administration of intravenous contrast. Multiplanar CT image reconstructions and MIPs were obtained to evaluate the vascular anatomy. Carotid stenosis measurements (when applicable) are obtained utilizing NASCET criteria, using the distal internal carotid diameter as the denominator. RADIATION DOSE REDUCTION: This exam was performed according to the departmental dose-optimization program which includes automated exposure control, adjustment of the mA and/or kV according to patient size and/or use of  iterative reconstruction technique. CONTRAST:  75mL OMNIPAQUE IOHEXOL 350  MG/ML SOLN COMPARISON:  Same day CT head FINDINGS: CTA NECK FINDINGS Aortic arch: Standard branching. Imaged portion shows no evidence of aneurysm or dissection. No significant stenosis of the major arch vessel origins. Right carotid system: No evidence of dissection, stenosis (50% or greater), or occlusion. Left carotid system: No evidence of dissection, stenosis (50% or greater), or occlusion. Vertebral arteries: Codominant. No evidence of dissection, stenosis (50% or greater), or occlusion. Skeleton: Negative. Other neck: Negative. Upper chest: Negative. Review of the MIP images confirms the above findings CTA HEAD FINDINGS Anterior circulation: No significant stenosis, proximal occlusion, aneurysm, or vascular malformation. Posterior circulation: No significant stenosis, proximal occlusion, aneurysm, or vascular malformation. Venous sinuses: As permitted by contrast timing, patent. Review of the MIP images confirms the above findings IMPRESSION: 1. No large vessel occlusion proximal hemodynamically significant stenosis. 2. No visible aneurysm or arteriovenous malformation. Electronically Signed   By: Feliberto Harts M.D.   On: 10/10/2023 08:38    CT HEAD CODE STROKE WO CONTRAST   Result Date: 10/10/2023 CLINICAL DATA:  Code stroke.  Neuro deficit, acute, stroke suspected EXAM: CT HEAD WITHOUT CONTRAST TECHNIQUE: Contiguous axial images were obtained from the base of the skull through the vertex without intravenous contrast. RADIATION DOSE REDUCTION: This exam was performed according to the departmental dose-optimization program which includes automated exposure control, adjustment of the mA and/or kV according to patient size and/or use of iterative reconstruction technique. COMPARISON:  None Available. FINDINGS: Brain: Acute 1.4 x 1.2 x 1.7 cm (estimated volume of 1.4 mL) intraparenchymal hemorrhage centered in the posterior limb of the right internal capsule. Mild surrounding edema. No substantial mass  effect. No midline shift. No hydrocephalus. No mass lesion. Vascular: No hyperdense vessel identified. Skull: No acute fracture. Sinuses/Orbits: Clear sinuses.  No acute orbital findings. Other: No mastoid effusions. ASPECTS [facility] Stroke Program Early CT Score) Total score (0-10 with 10 being normal): 10. IMPRESSION: 1. Acute 1.7 cm intraparenchymal hemorrhage centered in the posterior limb of the right internal capsule. No substantial mass effect. Findings discussed with Dr. Ezzie Dural via telephone at 8:24 AM. Electronically Signed   By: Feliberto Harts M.D.   On: 10/10/2023 08:25       Assessment/Plan: Diagnosis: Right internal capsule hemorrhage likely due to hypertension Does the need for close, 24 hr/day medical supervision in concert with the patient's rehab needs make it unreasonable for this patient to be served in a less intensive setting? Yes Co-Morbidities requiring supervision/potential complications:  -HTN, HLD, Hx of ETOH use, L ankle sprain, anxiety, COPD, CKD Due to bladder management, bowel management, safety, skin/wound care, disease management, medication administration, pain management, and patient education, does the patient require 24 hr/day rehab nursing? Yes Does the patient require coordinated care of a physician, rehab nurse, therapy disciplines of PT/OT to address physical and functional deficits in the context of the above medical diagnosis(es)? Yes Addressing deficits in the following areas: balance, endurance, locomotion, strength, transferring, bowel/bladder control, bathing, dressing, feeding, grooming, toileting, and psychosocial support Can the patient actively participate in an intensive therapy program of at least 3 hrs of therapy per day at least 5 days per week? Yes The potential for patient to make measurable gains while on inpatient rehab is excellent Anticipated functional outcomes upon discharge from inpatient rehab are modified independent  with PT, modified  independent with OT, n/a with SLP. Estimated rehab length of stay to reach the above functional goals is: 5-7 days  Anticipated discharge destination: Home Overall Rehab/Functional Prognosis: excellent   POST ACUTE RECOMMENDATIONS: This patient's condition is appropriate for continued rehabilitative care in the following setting: CIR Patient has agreed to participate in recommended program. Yes Note that insurance prior authorization may be required for reimbursement for recommended care.   Comment: I think she would be a good candidate for CIR when medically stable. Rehab coordinator to f/u      MEDICAL RECOMMENDATIONS: Consider ASO L ankle  Consider additional laxative to prevent constipation      I have personally performed a face to face diagnostic evaluation of this patient. Additionally, I have examined the patient's medical record including any pertinent labs and radiographic images. If the physician assistant has documented in this note, I have reviewed and edited or otherwise concur with the physician assistant's documentation.   Thanks,   Kathy Dance, MD 10/11/2023           Routing History

## 2023-10-12 NOTE — PMR Pre-admission (Signed)
PMR Admission Coordinator Pre-Admission Assessment  Patient: Kathy Vargas is an 72 y.o., female MRN: [id number] DOB: 1951/09/23 Height: 5\' 4"  (162.6 cm) Weight: 55.6 kg              Insurance Information HMO: yes    PPO:      PCP:      IPA:      80/20:      OTHER:  PRIMARY: [facility] Medicare  ; Medicare [id number]    Policy#: [id number]      Subscriber: pt CM Name: Inetta Fermo     Phone#: [phone] option 3     Fax#: [phone] Pre-Cert#: [id number] approved for 7 days 11/21 until 11/28      Employer:  Benefits:  Phone #: [phone]     Name: 11/20 Eff. Date: 11/21/22     Deduct: none      Out of Pocket Max: $8300      Life Max: none  CIR: $430 co pay per day days 1 until 4      SNF: no co pay per day days 1 until 20; $ 203 co pay per day days 21 until 100 Outpatient: $20 per visit     Co-Pay:  Home Health: 100%      Co-Pay:  DME: 80%     Co-Pay: 20% Providers: in network  SECONDARY: none  Financial Counselor:       Phone#:   The Data processing manager" for patients in Inpatient Rehabilitation Facilities with attached "Privacy Act Statement-Health Care Records" was provided and verbally reviewed with: Patient  Emergency Contact Information Contact Information     Name Relation Home Work Mobile   [place] Sister [phone]  [phone]   [facility] Daughter [phone]        Other Contacts   None on File    Current Medical History  Patient Admitting Diagnosis: ICH  History of Present Illness: 72 y.o. female with PMH of HTN, HLD, DJD, anxiety, COPD, CKD, alcohol abuse in remission, depression, headaches who presented to the hospital on 10/10/23 with left-sided weakness that she noticed while driving to work.  She had slurred speech but reports this is baseline.    BP was elevated and medications were started.  CT head showed 1.7 cm intraparenchymal hemorrhage centered in the posterior limb of the right internal capsule.  CTA head and  neck with no large vessel occlusion or significant stenosis.  Chart review indicates patient's daughter reported she does not like to take her blood pressure medicine.  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 Patient reports L ankle pain and swelling as she twisted her ankle getting out of her car.  Xray ankle negative for fracture   Complete NIHSS TOTAL: 3 Glasgow Coma Scale Score: 15  Patient's medical record from [facility] has been reviewed by the rehabilitation admission coordinator and physician.  Past Medical History  Past Medical History:  Diagnosis Date   ABDOMINAL TENDERNESS 01/21/2011   ACUTE GINGIVITIS NONPLAQUE INDUCED 06/11/2010   ADD 09/05/2008   Alcohol abuse, in remission 09/27/2007   ALLERGIC RHINITIS 03/02/2009   Anemia    ANXIETY 07/02/2007   BACK PAIN 05/19/2009   CHRONIC OBSTRUCTIVE PULMONARY DISEASE, ACUTE EXACERBATION 08/06/2009   COPD 07/02/2007   DEGENERATIVE JOINT DISEASE, CERVICAL SPINE 07/02/2007   DEPRESSION 07/02/2007   Dyspnea  GASTROENTERITIS, VIRAL 11/29/2010   GERD 01/21/2011   Headache(784.0) 07/02/2007   History of kidney stones    Hypertension    HYPOTHYROIDISM 07/02/2007   Lumbar disc disease    Lumbar pain with radiation down right leg 06/01/2011   OSTEOARTHRITIS 07/02/2007   Other and unspecified ovarian cyst 05/19/2009   PAIN, CHRONIC, DUE TO TRAUMA 07/02/2007   PELVIC  PAIN 03/29/2010   UTI 05/19/2009   Vitamin D insufficiency    Has the patient had major surgery during 100 days prior to admission? No  Family History  family history includes Alcohol abuse in an other family member; Anxiety disorder in an other family member; Coronary artery disease in an other family member; Depression in an other  family member; Stroke in an other family member.  Current Medications   Current Facility-Administered Medications:    acetaminophen (TYLENOL) tablet 650 mg, 650 mg, Oral, Q4H PRN, 650 mg at 10/11/23 0858 **OR** acetaminophen (TYLENOL) 160 MG/5ML solution 650 mg, 650 mg, Per Tube, Q4H PRN **OR** acetaminophen (TYLENOL) suppository 650 mg, 650 mg, Rectal, Q4H PRN, Lynnae January, NP   ALPRAZolam Prudy Feeler) tablet 0.5 mg, 0.5 mg, Oral, BID PRN, Elmer Picker, NP, 0.5 mg at 10/11/23 1816   calcium carbonate (TUMS - dosed in mg elemental calcium) chewable tablet 200 mg of elemental calcium, 1 tablet, Oral, Daily, Derry Lory, Salman, MD, 200 mg of elemental calcium at 10/10/23 1944   Chlorhexidine Gluconate Cloth 2 % PADS 6 each, 6 each, Topical, Daily, Micki Riley, MD, 6 each at 10/11/23 1526   heparin injection 5,000 Units, 5,000 Units, Subcutaneous, Q8H, Shafer, Devon, NP, 5,000 Units at 10/12/23 0615   hydrALAZINE (APRESOLINE) tablet 100 mg, 100 mg, Oral, TID, Pamalee Leyden, Devon, NP   irbesartan (AVAPRO) tablet 150 mg, 150 mg, Oral, Daily, Shafer, Devon, NP, 150 mg at 10/11/23 0857   labetalol (NORMODYNE) injection 20 mg, 20 mg, Intravenous, Q2H PRN, Pamalee Leyden, Devon, NP, 20 mg at 10/12/23 0506   nicotine (NICODERM CQ - dosed in mg/24 hours) patch 21 mg, 21 mg, Transdermal, Daily, Shafer, Devon, NP, 21 mg at 10/11/23 1230   Oral care mouth rinse, 15 mL, Mouth Rinse, PRN, Micki Riley, MD   oxyCODONE-acetaminophen (PERCOCET/ROXICET) 5-325 MG per tablet 1 tablet, 1 tablet, Oral, Q6H PRN, Elmer Picker, NP, 1 tablet at 10/12/23 0811   pantoprazole (PROTONIX) EC tablet 40 mg, 40 mg, Oral, QHS, Sethi, Pramod S, MD, 40 mg at 10/11/23 2230   senna-docusate (Senokot-S) tablet 1 tablet, 1 tablet, Oral, BID, Hetty Blend C, NP, 1 tablet at 10/11/23 2230  Patients Current Diet:  Diet Order             Diet Heart Room service appropriate? 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  Independent  Indoor Mobility: Did the patient need assistance with walking from room to room (  with or without device)? Independent  Stairs: Did the patient need assistance with internal or external stairs (with or without device)? Independent  Functional Cognition: Did the patient need help planning regular tasks such as shopping or remembering to take medications? Independent  Patient Information Are you of Hispanic, Latino/a,or Spanish origin?: A. No, not of Hispanic, Latino/a, or Spanish origin What is your race?: A. White Do you need or want an interpreter to communicate with a doctor or health care staff?: 0. No  Patient's Response To:  Health Literacy and Transportation Is the patient able to respond to health literacy and transportation needs?: Yes Health Literacy - How often do you need to have someone help you when you read instructions, pamphlets, or other written material from your doctor or pharmacy?: Never In the past 12 months, has lack of transportation kept you from medical appointments or from getting medications?: No In the past 12 months, has lack of transportation kept you from meetings, work, or from getting things needed for daily living?: No  Home Assistive Devices / Equipment Home Equipment: Grab bars - tub/shower  Prior Device Use: Indicate devices/aids used by the patient prior to  current illness, exacerbation or injury? None of the above  Current Functional Level Cognition  Overall Cognitive Status: Impaired/Different from baseline Current Attention Level: Selective Orientation Level: Oriented X4 Safety/Judgement: Decreased awareness of deficits, Decreased awareness of safety General Comments: Difficulty following commands when experiencing L ankle pain requring re-education for sequencing and safety. Attention: Sustained, Selective Sustained Attention: Appears intact Selective Attention: Impaired Selective Attention Impairment: Verbal basic Memory: Appears intact Awareness: Impaired Awareness Impairment: Emergent impairment Problem Solving: Impaired Problem Solving Impairment: Verbal basic    Extremity Assessment (includes Sensation/Coordination)  Upper Extremity Assessment: Right hand dominant, Overall WFL for tasks assessed  Lower Extremity Assessment: Defer to PT evaluation LLE Deficits / Details: AAROM grossly WFL except ankle limited DF with pain and edema, strength hip flexion 2+/5, knee extension 3/5, ankle DF 2/5, decreased coordination noted with stepping LLE Sensation: decreased proprioception    ADLs  Overall ADL's : Needs assistance/impaired Grooming: Oral care, Wash/dry face, Set up, Sitting Upper Body Bathing: Set up, Sitting Lower Body Bathing: Maximal assistance, Sitting/lateral leans, Sit to/from stand Upper Body Dressing : Set up, Sitting Lower Body Dressing: Maximal assistance, Sitting/lateral leans, Sit to/from stand Toilet Transfer: Minimal assistance, Ambulation, Regular Toilet, BSC/3in1, Rolling walker (2 wheels) Toilet Transfer Details (indicate cue type and reason): BSC placed over toilet Toileting- Clothing Manipulation and Hygiene: Maximal assistance, Sit to/from stand, Sitting/lateral lean, Cueing for safety Functional mobility during ADLs: Minimal assistance, Rolling walker (2 wheels)    Mobility  Overal bed mobility: Needs  Assistance Bed Mobility: Supine to Sit Supine to sit: HOB elevated, Used rails General bed mobility comments: up in recliner upon therapy arrival    Transfers  Overall transfer level: Needs assistance Equipment used: Rolling walker (2 wheels) Transfers: Sit to/from Stand, Bed to chair/wheelchair/BSC Sit to Stand: Min assist Bed to/from chair/wheelchair/BSC transfer type:: Step pivot Step pivot transfers: Mod assist General transfer comment: VC for hand placement with RW management prior to sit to stand. Pt provided with verbal education and visual demonstration of off load LLE while using BUE to push into RW handles and be able to step forward with minimal weight into L ankle.    Ambulation / Gait / Stairs / Wheelchair Mobility  Ambulation/Gait General Gait Details: unable due to pain, limited coordination/strength/sensation L LE    Posture / Balance Dynamic  Sitting Balance Sitting balance - Comments: decreased sitting balance noted when challenged towards right side Balance Overall balance assessment: Needs assistance Sitting-balance support: No upper extremity supported, Feet supported Sitting balance-Leahy Scale: Fair Sitting balance - Comments: decreased sitting balance noted when challenged towards right side Standing balance support: Bilateral upper extremity supported, Reliant on assistive device for balance, During functional activity Standing balance-Leahy Scale: Poor Standing balance comment: Able to maintain static balance without increased assist although when challenged with dynamic standing balance such as pulling up underwear, pt was unable to maintain balance without added assist.    Special needs/care consideration    Previous Home Environment  Living Arrangements: Alone  Lives With: Alone Available Help at Discharge: Family, Available 24 hours/day Type of Home: Apartment Home Layout: One level Home Access: Stairs to enter Entrance Stairs-Rails: Right, Can reach  both, Left Entrance Stairs-Number of Steps: 5 Bathroom Shower/Tub: Armed forces operational officer Accessibility: Yes How Accessible: Accessible via walker Home Care Services: No Additional Comments: patietn wants to d/c home alone with her new kitten  Discharge Living Setting Plans for Discharge Living Setting: Patient's home, Alone, Apartment Type of Home at Discharge: Apartment Discharge Home Layout: One level Discharge Home Access: Stairs to enter Entrance Stairs-Rails: Right Entrance Stairs-Number of Steps: 5 Discharge Bathroom Shower/Tub: Tub/shower unit Discharge Bathroom Toilet: Handicapped height Discharge Bathroom Accessibility: Yes How Accessible: Accessible via walker Does the patient have any problems obtaining your medications?: No  Social/Family/Support Systems Contact Information: daughter, Shawna [place] Anticipated Caregiver: daughter prn Anticipated Caregiver's Contact Information: see contacts Ability/Limitations of Caregiver: none; lives out of town Caregiver Availability: Intermittent Discharge Plan Discussed with Primary Caregiver: Yes Is Caregiver In Agreement with Plan?: Yes Does Caregiver/Family have Issues with Lodging/Transportation while Pt is in Rehab?: Yes  Goals Patient/Family Goal for Rehab: Mod I with PT and OT Expected length of stay: ELOS 5 to 7 days Pt/Family Agrees to Admission and willing to participate: Yes Program Orientation Provided & Reviewed with Pt/Caregiver Including Roles  & Responsibilities: Yes  Decrease burden of Care through IP rehab admission: n/a  Possible need for SNF placement upon discharge:not anticipated  Patient Condition: This patient's condition remains as documented in the consult dated 10/11/23, in which the Rehabilitation Physician determined and documented that the patient's condition is appropriate for intensive rehabilitative care in an inpatient rehabilitation facility. Will admit to inpatient  rehab today.  Preadmission Screen Completed By:  Clois Dupes, RN MSN 10/12/2023 10:07 AM ______________________________________________________________________   Discussed status with Dr. Shearon Stalls on 10/12/23 at 1007 and received approval for admission today.  Admission Coordinator:  Worthy Keeler MSN time 1610 Date 10/12/23

## 2023-10-12 NOTE — Progress Notes (Signed)
Inpatient Rehabilitation Admissions Coordinator   I have approval and bed to admit to Cir today. I met with patient at bedside, contacted Dr Pearlean Brownie, acute team and [facility] made aware. I will make the arrangements.  Ottie Glazier, RN, MSN Rehab Admissions Coordinator [phone] 10/12/2023 10:34 AM

## 2023-10-12 NOTE — TOC Transition Note (Signed)
Transition of Care [facility]) - CM/SW Discharge Note   Patient Details  Name: Tristin Demo MRN: [id number] Date of Birth: Jun 05, 1951  Transition of Care [facility]) CM/SW Contact:  Mearl Latin, LCSW Phone Number: 10/12/2023, 10:21 AM   Clinical Narrative:    Patient admitting to CIR today. No further TOC needs identified.    Final next level of care: IP Rehab Facility Barriers to Discharge: Barriers Resolved   Patient Goals and CMS Choice CMS Medicare.gov Compare Post Acute Care list provided to:: Patient Choice offered to / list presented to : Patient  Discharge Placement                      Patient and family notified of of transfer: 10/12/23  Discharge Plan and Services Additional resources added to the After Visit Summary for                                       Social Determinants of Health (SDOH) Interventions SDOH Screenings   Food Insecurity: No Food Insecurity (10/10/2023)  Housing: Low Risk  (10/10/2023)  Transportation Needs: No Transportation Needs (10/10/2023)  Utilities: Not At Risk (10/10/2023)  Alcohol Screen: Low Risk  (10/05/2022)  Depression (PHQ2-9): Low Risk  (04/14/2023)  Financial Resource Strain: Low Risk  (10/05/2022)  Physical Activity: Sufficiently Active (10/05/2022)  Social Connections: Socially Isolated (10/05/2022)  Stress: No Stress Concern Present (10/05/2022)  Tobacco Use: High Risk (10/12/2023)     Readmission Risk Interventions     No data to display

## 2023-10-12 NOTE — Progress Notes (Signed)
Kathy Sheriff, DO  Physician Physical Medicine and Rehabilitation   PMR Pre-admission     Signed   Date of Service: 10/12/2023  8:37 AM  Related encounter: ED to Hosp-Admission (Current) from 10/10/2023 in [facility] NEURO/TRAUMA/SURGICAL ICU   Signed     Expand All Collapse All  Show:Clear all [x] Written[x] Templated[x] Copied  Added by: [x] Standley Brooking, RN  [] Hover for details PMR Admission Coordinator Pre-Admission Assessment   Patient: Kathy Vargas is an 72 y.o., female MRN: [id number] DOB: 04-02-1951 Height: 5\' 4"  (162.6 cm) Weight: 55.6 kg                                                                                                                                                  Insurance Information HMO: yes    PPO:      PCP:      IPA:      80/20:      OTHER:  PRIMARY: [facility] Medicare  ; Medicare [id number]    Policy#: [id number]      Subscriber: pt CM Name: Inetta Fermo     Phone#: [phone] option 3     Fax#: [phone] Pre-Cert#: [id number] approved for 7 days 11/21 until 11/28      Employer:  Benefits:  Phone #: [phone]     Name: 11/20 Eff. Date: 11/21/22     Deduct: none      Out of Pocket Max: $8300      Life Max: none  CIR: $430 co pay per day days 1 until 4      SNF: no co pay per day days 1 until 20; $ 203 co pay per day days 21 until 100 Outpatient: $20 per visit     Co-Pay:  Home Health: 100%      Co-Pay:  DME: 80%     Co-Pay: 20% Providers: in network  SECONDARY: none   Financial Counselor:       Phone#:    The Data processing manager" for patients in Inpatient Rehabilitation Facilities with attached "Privacy Act Statement-Health Care Records" was provided and verbally reviewed with: Patient   Emergency Contact Information Contact Information       Name Relation Home Work Mobile    [place] Sister [phone]   [phone]    [facility] Daughter [phone]             Other Contacts    None on File      Current Medical History  Patient Admitting Diagnosis: ICH   History of Present Illness: 72 y.o. female with PMH of HTN, HLD, DJD, anxiety, COPD, CKD, alcohol abuse in remission, depression, headaches who presented to the hospital on 10/10/23 with left-sided weakness that she noticed while driving to work.  She had slurred speech but reports this is baseline.     BP was elevated  and medications were started.  CT head showed 1.7 cm intraparenchymal hemorrhage centered in the posterior limb of the right internal capsule.  CTA head and neck with no large vessel occlusion or significant stenosis.  Chart review indicates patient's daughter reported she does not like to take her blood pressure medicine.  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 Patient reports L ankle pain and swelling as she twisted her ankle getting out of her car.  Xray ankle negative for fracture    Complete NIHSS TOTAL: 3 Glasgow Coma Scale Score: 15   Patient[facility] medical record from [facility] has been reviewed by the rehabilitation admission coordinator and physician.   Past Medical History      Past Medical History:  Diagnosis Date   ABDOMINAL TENDERNESS 01/21/2011   ACUTE GINGIVITIS NONPLAQUE INDUCED 06/11/2010   ADD 09/05/2008   Alcohol abuse, in remission 09/27/2007   ALLERGIC RHINITIS 03/02/2009   Anemia     ANXIETY 07/02/2007   BACK PAIN 05/19/2009   CHRONIC OBSTRUCTIVE PULMONARY DISEASE, ACUTE EXACERBATION 08/06/2009   COPD 07/02/2007   DEGENERATIVE JOINT DISEASE, CERVICAL SPINE 07/02/2007   DEPRESSION 07/02/2007   Dyspnea     GASTROENTERITIS, VIRAL 11/29/2010   GERD 01/21/2011   Headache(784.0) 07/02/2007   History of kidney stones     Hypertension      HYPOTHYROIDISM 07/02/2007   Lumbar disc disease     Lumbar pain with radiation down right leg 06/01/2011   OSTEOARTHRITIS 07/02/2007   Other and unspecified ovarian cyst 05/19/2009   PAIN, CHRONIC, DUE TO TRAUMA 07/02/2007   PELVIC  PAIN 03/29/2010   UTI 05/19/2009   Vitamin D insufficiency          Has the patient had major surgery during 100 days prior to admission? No   Family History  family history includes Alcohol abuse in an other family member; Anxiety disorder in an other family member; Coronary artery disease in an other family member; Depression in an other family member; Stroke in an other family member.   Current Medications   Current Medications    Current Facility-Administered Medications:    acetaminophen (TYLENOL) tablet 650 mg, 650 mg, Oral, Q4H PRN, 650 mg at 10/11/23 0858 **OR** acetaminophen (TYLENOL) 160 MG/5ML solution 650 mg, 650 mg, Per Tube, Q4H PRN **OR** acetaminophen (TYLENOL) suppository 650 mg, 650 mg, Rectal, Q4H PRN, Lynnae January, NP   ALPRAZolam Prudy Feeler) tablet 0.5 mg, 0.5 mg, Oral, BID PRN, Elmer Picker, NP, 0.5 mg at 10/11/23 1816   calcium carbonate (TUMS - dosed in mg elemental calcium) chewable tablet 200 mg of elemental calcium, 1 tablet, Oral, Daily, Erick Blinks, MD, 200 mg of elemental calcium at 10/10/23 1944   Chlorhexidine Gluconate Cloth 2 % PADS 6 each, 6 each, Topical, Daily, Micki Riley, MD, 6 each at 10/11/23 1526   heparin injection 5,000 Units, 5,000 Units, Subcutaneous, Q8H, Shafer, Devon, NP, 5,000 Units at 10/12/23 0615   hydrALAZINE (APRESOLINE) tablet 100 mg, 100 mg, Oral, TID, Pamalee Leyden, Devon, NP   irbesartan (AVAPRO) tablet 150 mg, 150 mg, Oral, Daily, Shafer, Devon, NP, 150 mg at 10/11/23 0857   labetalol (NORMODYNE) injection 20 mg, 20 mg, Intravenous, Q2H PRN, Pamalee Leyden, [place], NP, 20 mg at 10/12/23 0506   nicotine (NICODERM CQ - dosed in mg/24 hours) patch 21 mg, 21 mg, Transdermal,  Daily, Pamalee Leyden, [place], NP, 21 mg at  10/11/23 1230   Oral care mouth rinse, 15 mL, Mouth Rinse, PRN, Micki Riley, MD   oxyCODONE-acetaminophen (PERCOCET/ROXICET) 5-325 MG per tablet 1 tablet, 1 tablet, Oral, Q6H PRN, Elmer Picker, NP, 1 tablet at 10/12/23 0811   pantoprazole (PROTONIX) EC tablet 40 mg, 40 mg, Oral, QHS, Sethi, Pramod S, MD, 40 mg at 10/11/23 2230   senna-docusate (Senokot-S) tablet 1 tablet, 1 tablet, Oral, BID, Hetty Blend C, NP, 1 tablet at 10/11/23 2230     Patients Current Diet:  Diet Order                  Diet Heart Room service appropriate? 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  Independent   Indoor Mobility: Did the patient need assistance with walking from room to room (with or without device)? Independent   Stairs: Did the patient need assistance with internal or external stairs (with or without device)? Independent   Functional Cognition: Did the patient need help planning regular tasks such as shopping or remembering to take medications? Independent   Patient Information Are you of Hispanic, Latino/a,or Spanish origin?: A. No, not of Hispanic, Latino/a, or Spanish origin What is your race?: A. White Do you need or want an interpreter to communicate with a doctor or health care staff?: 0. No   Patient's Response To:  Health Literacy and Transportation Is the patient  able to respond to health literacy and transportation needs?: Yes Health Literacy - How often do you need to have someone help you when you read instructions, pamphlets, or other written material from your doctor or pharmacy?: Never In the past 12 months, has lack of transportation kept you from medical appointments or from getting medications?: No In the past 12 months, has lack of transportation kept you from meetings, work, or from getting things needed for daily living?: No   Home Assistive Devices / Equipment Home Equipment: Grab bars - tub/shower   Prior Device Use: Indicate devices/aids used by the patient prior to current illness, exacerbation or injury? None of the above   Current Functional Level Cognition   Overall Cognitive Status: Impaired/Different from baseline Current Attention Level: Selective Orientation Level: Oriented X4 Safety/Judgement: Decreased awareness of deficits, Decreased awareness of safety General Comments: Difficulty following commands when experiencing L ankle pain requring re-education for sequencing and safety. Attention: Sustained, Selective Sustained Attention: Appears intact Selective Attention: Impaired Selective Attention Impairment: Verbal basic Memory: Appears intact Awareness: Impaired Awareness Impairment: Emergent impairment Problem Solving: Impaired Problem Solving Impairment: Verbal basic    Extremity Assessment (includes Sensation/Coordination)   Upper Extremity Assessment: Right hand dominant, Overall WFL for tasks assessed  Lower Extremity Assessment: Defer to PT evaluation LLE Deficits / Details: AAROM grossly WFL except ankle limited DF  with pain and edema, strength hip flexion 2+/5, knee extension 3/5, ankle DF 2/5, decreased coordination noted with stepping LLE Sensation: decreased proprioception     ADLs   Overall ADL's : Needs assistance/impaired Grooming: Oral care, Wash/dry face, Set up, Sitting Upper Body Bathing: Set up,  Sitting Lower Body Bathing: Maximal assistance, Sitting/lateral leans, Sit to/from stand Upper Body Dressing : Set up, Sitting Lower Body Dressing: Maximal assistance, Sitting/lateral leans, Sit to/from stand Toilet Transfer: Minimal assistance, Ambulation, Regular Toilet, BSC/3in1, Rolling walker (2 wheels) Toilet Transfer Details (indicate cue type and reason): BSC placed over toilet Toileting- Clothing Manipulation and Hygiene: Maximal assistance, Sit to/from stand, Sitting/lateral lean, Cueing for safety Functional mobility during ADLs: Minimal assistance, Rolling walker (2 wheels)     Mobility   Overal bed mobility: Needs Assistance Bed Mobility: Supine to Sit Supine to sit: HOB elevated, Used rails General bed mobility comments: up in recliner upon therapy arrival     Transfers   Overall transfer level: Needs assistance Equipment used: Rolling walker (2 wheels) Transfers: Sit to/from Stand, Bed to chair/wheelchair/BSC Sit to Stand: Min assist Bed to/from chair/wheelchair/BSC transfer type:: Step pivot Step pivot transfers: Mod assist General transfer comment: VC for hand placement with RW management prior to sit to stand. Pt provided with verbal education and visual demonstration of off load LLE while using BUE to push into RW handles and be able to step forward with minimal weight into L ankle.     Ambulation / Gait / Stairs / Wheelchair Mobility   Ambulation/Gait General Gait Details: unable due to pain, limited coordination/strength/sensation L LE     Posture / Balance Dynamic Sitting Balance Sitting balance - Comments: decreased sitting balance noted when challenged towards right side Balance Overall balance assessment: Needs assistance Sitting-balance support: No upper extremity supported, Feet supported Sitting balance-Leahy Scale: Fair Sitting balance - Comments: decreased sitting balance noted when challenged towards right side Standing balance support: Bilateral  upper extremity supported, Reliant on assistive device for balance, During functional activity Standing balance-Leahy Scale: Poor Standing balance comment: Able to maintain static balance without increased assist although when challenged with dynamic standing balance such as pulling up underwear, pt was unable to maintain balance without added assist.     Special needs/care consideration      Previous Home Environment  Living Arrangements: Alone  Lives With: Alone Available Help at Discharge: Family, Available 24 hours/day Type of Home: Apartment Home Layout: One level Home Access: Stairs to enter Entrance Stairs-Rails: Right, Can reach both, Left Entrance Stairs-Number of Steps: 5 Bathroom Shower/Tub: Armed forces operational officer Accessibility: Yes How Accessible: Accessible via walker Home Care Services: No Additional Comments: patietn wants to d/c home alone with her new kitten   Discharge Living Setting Plans for Discharge Living Setting: Patient's home, Alone, Apartment Type of Home at Discharge: Apartment Discharge Home Layout: One level Discharge Home Access: Stairs to enter Entrance Stairs-Rails: Right Entrance Stairs-Number of Steps: 5 Discharge Bathroom Shower/Tub: Tub/shower unit Discharge Bathroom Toilet: Handicapped height Discharge Bathroom Accessibility: Yes How Accessible: Accessible via walker Does the patient have any problems obtaining your medications?: No   Social/Family/Support Systems Contact Information: daughter, Shawna [place] Anticipated Caregiver: daughter prn Anticipated Caregiver's Contact Information: see contacts Ability/Limitations of Caregiver: none; lives out of town Caregiver Availability: Intermittent Discharge Plan Discussed with Primary Caregiver: Yes Is Caregiver In Agreement with Plan?: Yes Does Caregiver/Family have Issues with Lodging/Transportation while Pt is in Rehab?: Yes   Goals Patient/Family Goal for Rehab:  Mod I with PT and OT Expected length of stay: ELOS 5 to 7 days Pt/Family Agrees to Admission and willing to participate: Yes Program Orientation Provided & Reviewed with Pt/Caregiver Including Roles  & Responsibilities: Yes   Decrease burden of Care through IP rehab admission: n/a   Possible need for SNF placement upon discharge:not anticipated   Patient Condition: This patient's condition remains as documented in the consult dated 10/11/23, in which the Rehabilitation Physician determined and documented that the patient's condition is appropriate for intensive rehabilitative care in an inpatient rehabilitation facility. Will admit to inpatient rehab today.   Preadmission Screen Completed By:  Clois Dupes, RN MSN 10/12/2023 10:07 AM ______________________________________________________________________   Discussed status with Dr. Shearon Stalls on 10/12/23 at 1007 and received approval for admission today.   Admission Coordinator:  Worthy Keeler MSN time 1610 Date 10/12/23            Revision History

## 2023-10-12 NOTE — Discharge Summary (Addendum)
Stroke Discharge Summary  Patient ID: Kathy Vargas   MRN: [id number]      DOB: 1951/01/16  Date of Admission: 10/10/2023 Date of Discharge: 10/12/2023  Attending Physician:  Delia Heady MD Consultant(s):    rehabilitation medicine  Patient's PCP:  Kathy Levins, MD  DISCHARGE PRIMARY DIAGNOSIS: Intracerebral Hemorrhage:  Right internal capsule hemorrhage Etiology:  hypertensive    Patient Active Problem List   Diagnosis Date Noted   Chronic kidney disease 10/11/2023    Priority: High   ICH (intracerebral hemorrhage) (HCC) 10/10/2023    Priority: High   Essential hypertension 02/14/2021    Priority: High   Smoker 10/15/2014    Priority: High   Anxiety 10/11/2023    Priority: 15.   Alcohol abuse, in remission 09/27/2007    Priority: 15.   Epigastric pain 04/16/2023   Left renal stone 07/01/2022   Recurrent UTI 01/07/2022   Right otitis media 07/01/2020   Acute URI 02/04/2019   Fever 08/15/2017   Left low back pain 05/30/2017   Hyperlipidemia 05/06/2016   Hyperglycemia 05/06/2016   Venous (peripheral) insufficiency 05/06/2016   COPD exacerbation (HCC) 04/15/2015   Insomnia 04/15/2015   Fatigue 10/15/2014   Chronic pain syndrome 02/21/2014   Cough 12/25/2013   Menopausal problem 02/09/2012   Lumbar pain with radiation down right leg 06/01/2011   Encounter for well adult exam with abnormal findings 05/29/2011   GERD 01/21/2011   Vitamin D deficiency 11/29/2010   ACUTE GINGIVITIS NONPLAQUE INDUCED 06/11/2010   PELVIC  PAIN 03/29/2010   DISC DISEASE, LUMBAR 08/06/2009   Other and unspecified ovarian cyst 05/19/2009   Allergic rhinitis 03/02/2009   Attention deficit disorder 09/05/2008   Hypothyroidism 07/02/2007   Anxiety state 07/02/2007   Depression 07/02/2007   PAIN, CHRONIC, DUE TO TRAUMA 07/02/2007   COPD (chronic obstructive pulmonary disease) (HCC) 07/02/2007   OSTEOARTHRITIS 07/02/2007   DEGENERATIVE JOINT DISEASE, CERVICAL SPINE 07/02/2007     Allergies as of 10/12/2023       Reactions   Morphine Nausea And Vomiting   Ceftin [cefuroxime]    Nausea and vomiting        Medication List     TAKE these medications    acetaminophen 325 MG tablet Commonly known as: TYLENOL Take 2 tablets (650 mg total) by mouth every 4 (four) hours as needed for mild pain (pain score 1-3) (or temp > 37.5 C (99.5 F)).   ALPRAZolam 0.5 MG tablet Commonly known as: XANAX Take 1 tablet (0.5 mg) by mouth 2 times daily as needed.   amphetamine-dextroamphetamine 30 MG tablet Commonly known as: ADDERALL Take 1 tablet by mouth 2 (two) times daily.   CAL-MAG-ZINC PO Take 1 tablet by mouth at bedtime.   calcium carbonate 500 MG chewable tablet Commonly known as: TUMS - dosed in mg elemental calcium Chew 1 tablet (200 mg of elemental calcium total) by mouth daily. Start taking on: October 13, 2023   cyanocobalamin 1000 MCG tablet Commonly known as: VITAMIN B12 Take 1 tablet (1,000 mcg total) by mouth daily.   fluticasone 0.005 % ointment Commonly known as: CUTIVATE Apply to the skin 2 times daily if needed   fluticasone-salmeterol 250-50 MCG/ACT Aepb Commonly known as: Advair Diskus Inhale 1 puff into the lungs 2 (two) times daily as needed (Asthma).   HAIR/SKIN/NAILS PO Take 1 tablet by mouth daily.   hydrALAZINE 100 MG tablet Commonly known as: APRESOLINE Take 1 tablet (100 mg total) by mouth 3 (three)  times daily. What changed:  medication strength how much to take   irbesartan 150 MG tablet Commonly known as: Avapro Take 1 tablet (150 mg total) by mouth daily.   nicotine 21 mg/24hr patch Commonly known as: NICODERM CQ - dosed in mg/24 hours Place 1 patch (21 mg total) onto the skin daily.   pantoprazole 40 MG tablet Commonly known as: PROTONIX Take 1 tablet (40 mg total) by mouth daily.   VITAMIN D3 PO Take 1 tablet by mouth in the morning.        LABORATORY STUDIES CBC    Component Value Date/Time    WBC 6.2 10/10/2023 0757   RBC 4.25 10/10/2023 0757   HGB 12.6 10/10/2023 0804   HCT 37.0 10/10/2023 0804   PLT 248 10/10/2023 0757   MCV 90.8 10/10/2023 0757   MCH 29.4 10/10/2023 0757   MCHC 32.4 10/10/2023 0757   RDW 12.6 10/10/2023 0757   LYMPHSABS 1.5 10/10/2023 0757   MONOABS 0.6 10/10/2023 0757   EOSABS 0.3 10/10/2023 0757   BASOSABS 0.1 10/10/2023 0757   CMP    Component Value Date/Time   NA 138 10/10/2023 0804   K 4.2 10/10/2023 0804   CL 108 10/10/2023 0804   CO2 24 10/10/2023 0757   GLUCOSE 105 (H) 10/10/2023 0804   BUN 25 (H) 10/10/2023 0804   CREATININE 1.70 (H) 10/10/2023 0804   CALCIUM 9.2 10/10/2023 0757   PROT 6.7 10/10/2023 0757   ALBUMIN 3.6 10/10/2023 0757   AST 24 10/10/2023 0757   ALT 18 10/10/2023 0757   ALKPHOS 68 10/10/2023 0757   BILITOT 0.5 10/10/2023 0757   GFRNONAA 36 (L) 10/10/2023 0757   GFRAA 41 (L) 08/07/2020 0903   COAGS Lab Results  Component Value Date   INR 0.9 10/10/2023   INR 0.9 07/01/2022   Lipid Panel    Component Value Date/Time   CHOL 225 (H) 04/21/2023 1434   TRIG 126.0 04/21/2023 1434   HDL 75.80 04/21/2023 1434   CHOLHDL 3 04/21/2023 1434   VLDL 25.2 04/21/2023 1434   LDLCALC 124 (H) 04/21/2023 1434   HgbA1C  Lab Results  Component Value Date   HGBA1C 6.0 04/21/2023   Alcohol Level    Component Value Date/Time   ETH <10 10/10/2023 0757     SIGNIFICANT DIAGNOSTIC STUDIES DG Ankle 2 Views Left  Result Date: 10/10/2023 CLINICAL DATA:  Left ankle pain EXAM: LEFT ANKLE - 2 VIEW COMPARISON:  None Available. FINDINGS: There is no evidence of fracture, dislocation, or joint effusion. There is no evidence of arthropathy or other focal bone abnormality. Soft tissues are unremarkable. IMPRESSION: Negative. Electronically Signed   By: Charlett Nose M.D.   On: 10/10/2023 20:10   ECHOCARDIOGRAM COMPLETE  Result Date: 10/10/2023    ECHOCARDIOGRAM REPORT   Patient Name:   Kathy Vargas Date of Exam: 10/10/2023  Medical Rec #:  [id number]       Height:       65.0 in Accession #:    [id number]      Weight:       122.6 lb Date of Birth:  1951/01/18       BSA:          1.607 m Patient Age:    72 years        BP:           118/72 mmHg Patient Gender: F               HR:  72 bpm. Exam Location:  Inpatient Procedure: 2D Echo, Cardiac Doppler and Color Doppler Indications:    Stroke I63.9  History:        Patient has no prior history of Echocardiogram examinations.                 Stroke and COPD; Risk Factors:Hypertension, Dyslipidemia and                 Current Smoker.  Sonographer:    Dondra Prader RVT RCS Referring Phys: [id number] Lynnae January IMPRESSIONS  1. Left ventricular ejection fraction, by estimation, is 60 to 65%. The left ventricle has normal function. The left ventricle has no regional wall motion abnormalities. Left ventricular diastolic parameters are indeterminate.  2. Right ventricular systolic function is normal. The right ventricular size is normal. Tricuspid regurgitation signal is inadequate for assessing PA pressure.  3. The mitral valve is normal in structure. Trivial mitral valve regurgitation. No evidence of mitral stenosis.  4. The aortic valve is normal in structure. Aortic valve regurgitation is trivial. No aortic stenosis is present.  5. The inferior vena cava is normal in size with greater than 50% respiratory variability, suggesting right atrial pressure of 3 mmHg. Conclusion(s)/Recommendation(s): No intracardiac source of embolism detected on this transthoracic study. Consider a transesophageal echocardiogram to exclude cardiac source of embolism if clinically indicated. FINDINGS  Left Ventricle: Left ventricular ejection fraction, by estimation, is 60 to 65%. The left ventricle has normal function. The left ventricle has no regional wall motion abnormalities. The left ventricular internal cavity size was normal in size. There is  no left ventricular hypertrophy. Left ventricular diastolic  parameters are indeterminate. Normal left ventricular filling pressure. Right Ventricle: The right ventricular size is normal. No increase in right ventricular wall thickness. Right ventricular systolic function is normal. Tricuspid regurgitation signal is inadequate for assessing PA pressure. Left Atrium: Left atrial size was normal in size. Right Atrium: Right atrial size was normal in size. Pericardium: There is no evidence of pericardial effusion. Mitral Valve: The mitral valve is normal in structure. Trivial mitral valve regurgitation. No evidence of mitral valve stenosis. Tricuspid Valve: The tricuspid valve is normal in structure. Tricuspid valve regurgitation is trivial. No evidence of tricuspid stenosis. Aortic Valve: The aortic valve is normal in structure. Aortic valve regurgitation is trivial. No aortic stenosis is present. Aortic valve mean gradient measures 3.0 mmHg. Aortic valve peak gradient measures 5.6 mmHg. Aortic valve area, by VTI measures 2.42 cm. Pulmonic Valve: The pulmonic valve was normal in structure. Pulmonic valve regurgitation is not visualized. No evidence of pulmonic stenosis. Aorta: The aortic root is normal in size and structure. Venous: The inferior vena cava is normal in size with greater than 50% respiratory variability, suggesting right atrial pressure of 3 mmHg. IAS/Shunts: The interatrial septum appears to be lipomatous. No atrial level shunt detected by color flow Doppler.  LEFT VENTRICLE PLAX 2D LVIDd:         4.00 cm   Diastology LVIDs:         2.70 cm   LV e' medial:    5.77 cm/s LV PW:         1.00 cm   LV E/e' medial:  13.0 LV IVS:        0.90 cm   LV e' lateral:   8.59 cm/s LVOT diam:     1.80 cm   LV E/e' lateral: 8.7 LV SV:         60  LV SV Index:   37 LVOT Area:     2.54 cm  RIGHT VENTRICLE             IVC RV S prime:     12.70 cm/s  IVC diam: 1.30 cm TAPSE (M-mode): 2.3 cm LEFT ATRIUM             Index        RIGHT ATRIUM           Index LA diam:        2.80 cm  1.74 cm/m   RA Area:     10.90 cm LA Vol (A2C):   24.1 ml 15.00 ml/m  RA Volume:   22.20 ml  13.82 ml/m LA Vol (A4C):   21.2 ml 13.20 ml/m LA Biplane Vol: 24.4 ml 15.19 ml/m  AORTIC VALVE                    PULMONIC VALVE AV Area (Vmax):    2.24 cm     PV Vmax:       0.88 m/s AV Area (Vmean):   2.31 cm     PV Peak grad:  3.1 mmHg AV Area (VTI):     2.42 cm AV Vmax:           118.00 cm/s AV Vmean:          77.800 cm/s AV VTI:            0.248 m AV Peak Grad:      5.6 mmHg AV Mean Grad:      3.0 mmHg LVOT Vmax:         104.00 cm/s LVOT Vmean:        70.700 cm/s LVOT VTI:          0.236 m LVOT/AV VTI ratio: 0.95  AORTA Ao Root diam: 2.80 cm Ao Asc diam:  3.00 cm MITRAL VALVE MV Area (PHT): 3.17 cm    SHUNTS MV Decel Time: 239 msec    Systemic VTI:  0.24 m MV E velocity: 74.90 cm/s  Systemic Diam: 1.80 cm MV A velocity: 85.90 cm/s MV E/A ratio:  0.87 Armanda Magic MD Electronically signed by Armanda Magic MD Signature Date/Time: 10/10/2023/7:15:27 PM    Final    MR BRAIN WO CONTRAST  Result Date: 10/10/2023 CLINICAL DATA:  Stroke, follow-up. EXAM: MRI HEAD WITHOUT CONTRAST TECHNIQUE: Multiplanar, multiecho pulse sequences of the brain and surrounding structures were obtained without intravenous contrast. COMPARISON:  Head CT October 10, 2023. FINDINGS: Brain: Focus of acute intraparenchymal hemorrhage centered in the posterior limb of the right internal capsule, extending into the putamen thalamus, measuring approximately 14 x 10 mm, correlating with findings described on prior head CT. There is no significant mass effect or midline shift. No hydrocephalus. Scattered and confluent foci of T2 hyperintensity are seen within the white matter of the cerebral hemispheres and within the pons, nonspecific. Remote infarct in the right middle cerebellar peduncle/right cerebellar hemisphere. Vascular: Normal flow voids. Skull and upper cervical spine: Normal marrow signal. Sinuses/Orbits: Negative. Other: None.  IMPRESSION: 1. Focus of acute intraparenchymal hemorrhage centered in the posterior limb of the right internal capsule, extending into the putamen and thalamus, correlating with findings described on prior head CT. No significant mass effect or midline shift. 2. Moderate chronic microvascular ischemic changes of the white matter. 3. Remote infarct in the right middle cerebellar peduncle/right cerebellar hemisphere. Electronically Signed   By: Blenda Nicely.D.  On: 10/10/2023 15:35   CT ANGIO HEAD NECK W WO CM (CODE STROKE)  Result Date: 10/10/2023 CLINICAL DATA:  Neuro deficit, acute stroke suspected. left-sided weakness. EXAM: CT ANGIOGRAPHY HEAD AND NECK WITH AND WITHOUT CONTRAST TECHNIQUE: Multidetector CT imaging of the head and neck was performed using the standard protocol during bolus administration of intravenous contrast. Multiplanar CT image reconstructions and MIPs were obtained to evaluate the vascular anatomy. Carotid stenosis measurements (when applicable) are obtained utilizing NASCET criteria, using the distal internal carotid diameter as the denominator. RADIATION DOSE REDUCTION: This exam was performed according to the departmental dose-optimization program which includes automated exposure control, adjustment of the mA and/or kV according to patient size and/or use of iterative reconstruction technique. CONTRAST:  75mL OMNIPAQUE IOHEXOL 350 MG/ML SOLN COMPARISON:  Same day CT head FINDINGS: CTA NECK FINDINGS Aortic arch: Standard branching. Imaged portion shows no evidence of aneurysm or dissection. No significant stenosis of the major arch vessel origins. Right carotid system: No evidence of dissection, stenosis (50% or greater), or occlusion. Left carotid system: No evidence of dissection, stenosis (50% or greater), or occlusion. Vertebral arteries: Codominant. No evidence of dissection, stenosis (50% or greater), or occlusion. Skeleton: Negative. Other neck: Negative.  Upper chest: Negative. Review of the MIP images confirms the above findings CTA HEAD FINDINGS Anterior circulation: No significant stenosis, proximal occlusion, aneurysm, or vascular malformation. Posterior circulation: No significant stenosis, proximal occlusion, aneurysm, or vascular malformation. Venous sinuses: As permitted by contrast timing, patent. Review of the MIP images confirms the above findings IMPRESSION: 1. No large vessel occlusion proximal hemodynamically significant stenosis. 2. No visible aneurysm or arteriovenous malformation. Electronically Signed   By: Feliberto Harts M.D.   On: 10/10/2023 08:38   CT HEAD CODE STROKE WO CONTRAST  Result Date: 10/10/2023 CLINICAL DATA:  Code stroke.  Neuro deficit, acute, stroke suspected EXAM: CT HEAD WITHOUT CONTRAST TECHNIQUE: Contiguous axial images were obtained from the base of the skull through the vertex without intravenous contrast. RADIATION DOSE REDUCTION: This exam was performed according to the departmental dose-optimization program which includes automated exposure control, adjustment of the mA and/or kV according to patient size and/or use of iterative reconstruction technique. COMPARISON:  None Available. FINDINGS: Brain: Acute 1.4 x 1.2 x 1.7 cm (estimated volume of 1.4 mL) intraparenchymal hemorrhage centered in the posterior limb of the right internal capsule. Mild surrounding edema. No substantial mass effect. No midline shift. No hydrocephalus. No mass lesion. Vascular: No hyperdense vessel identified. Skull: No acute fracture. Sinuses/Orbits: Clear sinuses.  No acute orbital findings. Other: No mastoid effusions. ASPECTS [facility] Stroke Program Early CT Score) Total score (0-10 with 10 being normal): 10. IMPRESSION: 1. Acute 1.7 cm intraparenchymal hemorrhage centered in the posterior limb of the right internal capsule. No substantial mass effect. Findings discussed with Dr. Ezzie Dural via telephone at 8:24 AM. Electronically Signed   By:  Feliberto Harts M.D.   On: 10/10/2023 08:25       HISTORY OF PRESENT ILLNESS 72 y.o. patient with history of hypertension, chronic pain, anxiety, COPD, DJD, depression, headaches, alcohol abuse (in remission) who was brought in by EMS as a code stroke due to acute onset of left-sided weakness with a last known well of 0627. Got to work around [phone] 11/19 and noted that her leg was acting funny. Leg wouldn't hold her when she got out of the car and then grabbed the car door and scrapped her hand. Left leg weakness main deficit, now having pain in the left  ankle.  Woke up in her usual state of health.   HOSPITAL COURSE Intracerebral Hemorrhage:  Right internal capsule hemorrhage Etiology:  hypertensive  Code Stroke CT head - Acute 1.7 cm intraparenchymal hemorrhage centered in the posterior limb of the right internal capsule. No substantial mass effect. CTA head & neck - No LVO  MRI  Focus of acute intraparenchymal hemorrhage centered in the posterior limb of the right internal capsule, extending into the putamen and thalamus 2D Echo EF 60-65%  LDL 124 HgbA1c 6.0 VTE prophylaxis - subcutaneous heparin  No antithrombotic prior to admission, now on No antithrombotic due to ICH Therapy recommendations:  CIR Disposition: Discharge to CIR    Hypertension Home meds:  hydralazine, irbesartan - resumed. Hx of noncompliance Stable Blood Pressure Goal: SBP less 160 Hydralazine increased today   Hyperlipidemia LDL 124, goal < 70 Add atorvastatin 40mg  Continue statin at discharge   Other Stroke Risk Factors ETOH use, alcohol level <10, advised to drink no more than 1 drink(s) a day Current smoker. Smoking cessation education provided Nicotine patch ordered    Other Active Problems Left ankle pain XR negative for fracture, does have swelling PRN percocet for 3 doses  Anxiety COPD CKD 3b Cr at baseline     DISCHARGE EXAM PHYSICAL EXAM General:  Alert, well-nourished, well-developed  patient in no acute distress Psych:  Mood and affect appropriate for situation CV: Regular rate and rhythm on monitor Respiratory:  Regular, unlabored respirations on room air GI: Abdomen soft and nontender     Neuro: Mental Status: Patient is awake, alert, oriented to person, place, month, year, and situation. Patient is able to give a clear and coherent history. No signs of aphasia or neglect. Dysarthria present (patient states this is baseline) Cranial Nerves: II: Visual Fields are full. Pupils are equal, round, and reactive to light.   III,IV, VI: EOMI without ptosis or diploplia.  V: Facial sensation is symmetric to temperature VII: Facial movement is symmetric.  VIII: hearing is intact to voice X: Uvula elevates symmetrically XI: Shoulder shrug is symmetric. XII: tongue is midline without atrophy or fasciculations.  Motor: Tone is normal. Bulk is normal. LUE: 4/5 with mild drift LLE: 4/5 with mild drift RUE: 5/5, no drift RLE: 5/5, no drift.  Sensory: Sensation is symmetric to light touch and temperature in the arms and legs. Cerebellar: Ataxia present in left arm and leg   1a Level of Conscious.: 0 1b LOC Questions: 0 1c LOC Commands: 0 2 Best Gaze: 0 3 Visual: 0 4 Facial Palsy: 0 5a Motor Arm - left: 0 5b Motor Arm - Right: 0 6a Motor Leg - Left: 1 6b Motor Leg - Right: 0 7 Limb Ataxia: 1 8 Sensory: 1 9 Best Language: 0 10 Dysarthria: 0 11 Extinct. and Inatten.: 0 TOTAL: 3   Discharge Diet       Diet   Diet Heart Room service appropriate? Yes; Fluid consistency: Thin   liquids  DISCHARGE PLAN Disposition: Rehab No antithrombotic for secondary stroke prevention due to ICH Ongoing stroke risk factor control by Primary Care Physician at time of discharge Follow-up PCP Kathy Levins, MD in 2 weeks. Follow-up in [facility] in 8 weeks, office to schedule an appointment. Able to see NP in clinic.  32 minutes were spent  preparing discharge.  Patient seen and examined by NP/APP with MD. MD to update note as needed.   Elmer Picker, DNP, FNP-BC [facility] Pager: [phone]  I have personally obtained history,examined this patient, reviewed notes, independently viewed imaging studies, participated in medical decision making and plan of care.ROS completed by me personally and pertinent positives fully documented  I have made any additions or clarifications directly to the above note. Agree with note above.    Delia Heady, MD Medical Director [facility] Pager: [phone] 10/12/2023 2:30 PM

## 2023-10-12 NOTE — Discharge Instructions (Signed)
Patient education given on 10-12-2023 and the patient expresses understanding and acceptance of instructions. Kathy Vargas C 10/12/2023 3:31 PM

## 2023-10-12 NOTE — H&P (Signed)
Physical Medicine and Rehabilitation Admission H&P     CC: Functional deficits secondary to basal ganglia ICH   HPI: Kathy Vargas is a 72 year old who presented to the ED on 10/11/2023 with left sided weakness and baseline slurred speech. Code stroke initiated. CT head revealed acute 1.7 cm intraparenchymal hemorrhage centered in the posterior limb of the right internal capsule. No substantial mass effect. Patient was hypertensive with SBP in the 200s. Cleviprex started. Now on oral home meds. CTA no LVO. Started on heparin for DVT prophy. Bumped left ankle when exiting car>>x-rays negative. Tolerating diet. PMH significant for hypertension, chronic pain, DJD, previous L-S surgery, prior alcohol use.She has elevated BUN/Cr>> question AKI versus CKD. No diagnosis of this in her chart or note from PCP in May. [place] system chemistry panel remarkable for elevated serum creatinine dating back to April 2023 with baseline around 2.1. Primary complaint is left lateral ankle tenderness and pain with weight-bearing. Requests new dressing to left hand skin tear. The patient requires inpatient medicine and rehabilitation evaluations and services for ongoing dysfunction secondary to ICH.     Works at [employer].   ROS     Past Medical History:  Diagnosis Date   ABDOMINAL TENDERNESS 01/21/2011   ACUTE GINGIVITIS NONPLAQUE INDUCED 06/11/2010   ADD 09/05/2008   Alcohol abuse, in remission 09/27/2007   ALLERGIC RHINITIS 03/02/2009   Anemia     ANXIETY 07/02/2007   BACK PAIN 05/19/2009   CHRONIC OBSTRUCTIVE PULMONARY DISEASE, ACUTE EXACERBATION 08/06/2009   COPD 07/02/2007   DEGENERATIVE JOINT DISEASE, CERVICAL SPINE 07/02/2007   DEPRESSION 07/02/2007   Dyspnea     GASTROENTERITIS, VIRAL 11/29/2010   GERD 01/21/2011   Headache(784.0) 07/02/2007   History of kidney stones     Hypertension     HYPOTHYROIDISM 07/02/2007   Lumbar disc disease     Lumbar pain with radiation down right leg  06/01/2011   OSTEOARTHRITIS 07/02/2007   Other and unspecified ovarian cyst 05/19/2009   PAIN, CHRONIC, DUE TO TRAUMA 07/02/2007   PELVIC  PAIN 03/29/2010   UTI 05/19/2009   Vitamin D insufficiency               Past Surgical History:  Procedure Laterality Date   ABDOMINAL HYSTERECTOMY       CATARACT EXTRACTION W/ INTRAOCULAR LENS IMPLANT Bilateral     CHOLECYSTECTOMY       CYSTOSCOPY/URETEROSCOPY/HOLMIUM LASER/STENT PLACEMENT Left 07/01/2022    Procedure: CYSTOSCOPY/ RETROGRADE/URETEROSCOPY/STENT PLACEMENT;  Surgeon: Jannifer Hick, MD;  Location: [facility];  Service: Urology;  Laterality: Left;   eyebrow lift       IR URETERAL STENT LEFT NEW ACCESS W/O SEP NEPHROSTOMY CATH   07/01/2022   LIPOSUCTION       LUMBAR DISC SURGERY        L4, L5   NEPHROLITHOTOMY Left 07/01/2022    Procedure: NEPHROLITHOTOMY PERCUTANEOUS/ INTERVENTIONAL RADIOLOGY TO PLACE LEFT NEPHROSTOMY TUBE PRIOR;  Surgeon: Jannifer Hick, MD;  Location: [facility];  Service: Urology;  Laterality: Left;  ONLY NEEDS 120 MIN FOR ALL PROCEDURES   URETEROSCOPY WITH HOLMIUM LASER LITHOTRIPSY Right 04/12/2022    Procedure: URETEROSCOPY WITH HOLMIUM LASER LITHOTRIPSY, STENT PLACEMENT;  Surgeon: Despina Arias, MD;  Location: [facility];  Service: Urology;  Laterality: Right;             Family History  Problem Relation Age of Onset   Alcohol abuse Other     Anxiety disorder Other  Coronary artery disease Other     Depression Other     Stroke Other          Social History:  reports that she has been smoking cigarettes. She started smoking about 60 years ago. She has a 30.4 pack-year smoking history. She has never used smokeless tobacco. She reports that she does not currently use alcohol. She reports that she does not use drugs. Allergies:  Allergies       Allergies  Allergen Reactions   Morphine Nausea And Vomiting   Ceftin [Cefuroxime]        Nausea and vomiting            Medications Prior to Admission   Medication Sig Dispense Refill   ALPRAZolam (XANAX) 0.5 MG tablet Take 1 tablet (0.5 mg) by mouth 2 times daily as needed. 60 tablet 5   amphetamine-dextroamphetamine (ADDERALL) 30 MG tablet Take 1 tablet by mouth 2 (two) times daily. 60 tablet 0   Biotin w/ Vitamins C & E (HAIR/SKIN/NAILS PO) Take 1 tablet by mouth daily.       Calcium-Magnesium-Zinc (CAL-MAG-ZINC PO) Take 1 tablet by mouth at bedtime.       Cholecalciferol (VITAMIN D3 PO) Take 1 tablet by mouth in the morning.       cyanocobalamin (VITAMIN B12) 1000 MCG tablet Take 1 tablet (1,000 mcg total) by mouth daily. 90 tablet 3   fluticasone-salmeterol (ADVAIR DISKUS) 250-50 MCG/ACT AEPB Inhale 1 puff into the lungs 2 (two) times daily as needed (Asthma). 180 each 3   hydrALAZINE (APRESOLINE) 50 MG tablet Take 1 tablet (50 mg total) by mouth 3 (three) times daily. 90 tablet 1   irbesartan (AVAPRO) 150 MG tablet Take 1 tablet (150 mg total) by mouth daily. 90 tablet 3   pantoprazole (PROTONIX) 40 MG tablet Take 1 tablet (40 mg total) by mouth daily. 90 tablet 3   fluticasone (CUTIVATE) 0.005 % ointment Apply to the skin 2 times daily if needed 30 g 1              Home: Home Living Family/patient expects to be discharged to:: Private residence Living Arrangements: Alone Available Help at Discharge: Family, Available 24 hours/day Type of Home: Apartment Home Access: Stairs to enter [employer] of Steps: 5 Entrance Stairs-Rails: Right, Can reach both, Left Home Layout: One level Bathroom Shower/Tub: Engineer, manufacturing systems: Standard Bathroom Accessibility: Yes Home Equipment: Grab bars - tub/shower Additional Comments: patietn wants to d/c home alone with her new kitten  Lives With: Alone   Functional History: Prior Function Prior Level of Function : Independent/Modified Independent, Driving 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 Pt provided with verbal education and visual demonstration of off load LLE while using BUE to push into RW handles and be able to step forward with minimal weight into L ankle. Ambulation/Gait General Gait Details: unable due to pain, limited coordination/strength/sensation L LE   ADL: ADL Overall ADL's :  Needs assistance/impaired Grooming: Oral care, Wash/dry face, Set up, Sitting Upper Body Bathing: Set up, Sitting Lower Body Bathing: Maximal assistance, Sitting/lateral leans, Sit to/from stand Upper Body Dressing : Set up, Sitting Lower Body Dressing: Maximal assistance, Sitting/lateral leans, Sit to/from stand Toilet Transfer: Minimal assistance, Ambulation, Regular Toilet, BSC/3in1, Rolling walker (2 wheels) Toilet Transfer Details (indicate cue type and reason): BSC placed over toilet Toileting- Clothing Manipulation and Hygiene: Maximal assistance, Sit to/from stand, Sitting/lateral lean, Cueing for safety Functional mobility during ADLs: Minimal assistance, Rolling walker (2 wheels)   Cognition: Cognition Overall Cognitive Status: Impaired/Different from baseline Orientation Level: Oriented X4 Attention: Sustained, Selective Sustained Attention: Appears intact Selective Attention: Impaired Selective Attention Impairment: Verbal basic Memory: Appears intact Awareness: Impaired Awareness Impairment: Emergent impairment Problem Solving:  Impaired Problem Solving Impairment: Verbal basic Cognition Arousal: Alert Behavior During Therapy: WFL for tasks assessed/performed Overall Cognitive Status: Impaired/Different from baseline Area of Impairment: Safety/judgement, Problem solving, Attention, Memory Current Attention Level: Selective Memory: Decreased short-term memory Safety/Judgement: Decreased awareness of deficits, Decreased awareness of safety Problem Solving: Slow processing, Difficulty sequencing, Requires verbal cues, Requires tactile cues General Comments: Difficulty following commands when experiencing L ankle pain requring re-education for sequencing and safety.   Physical Exam: Blood pressure (!) 149/85, pulse 64, temperature 98 F (36.7 C), temperature source Oral, resp. rate 13, height 5\' 4"  (1.626 m), weight 55.6 kg, SpO2 95%.  Physical Exam Constitutional: No apparent distress. Appropriate appearance for age.  HENT: No JVD. Neck Supple. Trachea midline. Atraumatic, normocephalic. Eyes: PERRLA. EOMI. Visual fields grossly intact.  Cardiovascular: RRR, no murmurs/rub/gallops.   Peripheral pulses 2+. +1 edema on L forefoot/ankle Respiratory: CTAB. No rales, rhonchi, or wheezing. On RA.  Abdomen: + bowel sounds, normoactive. No distention or tenderness.  GU: Not examined.  Skin: Multiple bruises/excoriations, most notable on LUE     MSK:      No apparent deformity.  LLE: + TTP over L ATFL ligament; none over lateral or medial malleoli. Full AROM ankle and toes.        Neurologic exam:  Cognition: AAO to person, place, time and event.  Language: Fluent, No substitutions or neoglisms. No dysarthria. Names 3/3 objects correctly.  Memory: Recalls 3/3 objects at 5 minutes. No apparent deficits  Insight: Good insight into current condition.  Mood: Elevated, poor impulse control, constantly moving. No SI/HI.   Sensation: To light touch reduced over left foot to mid-shin; otherwise intact Reflexes: 2+ in BL  UE and LEs. Negative Hoffman's and babinski signs bilaterally.  CN: + Sensory reduction in L V2. Otherwise intact.  Coordination: No apparent tremors. No ataxia on FTN, HTS bilaterally.  Spasticity: MAS 0 in all extremities.  Strength:                RUE: 5/5 SA, 5/5 EF, 5/5 EE, 5/5 WE, 5/5 FF, 5/5 FA                 LUE: 5/5 SA, 5/5 EF, 5/5 EE, 5/5 WE, 5/5 FF, 5/5 FA                 RLE: 5/5 HF, 5/5 KE, 5/5 DF, 5/5 EHL, 5/5 PF                 LLE:  5/5 HF, 5/5 KE, 5/5 DF, 5/5 EHL, 5/5 PF        Lab Results Last 48 Hours        Results for orders placed or performed during  the hospital encounter of 10/10/23 (from the past 48 hour(s))  MRSA Next Gen by PCR, Nasal     Status: None    Collection Time: 10/10/23  1:49 PM    Specimen: Nasal Mucosa; Nasal Swab  Result Value Ref Range    MRSA by PCR Next Gen NOT DETECTED NOT DETECTED      Comment: (NOTE) The GeneXpert MRSA Assay (FDA approved for NASAL specimens only), is one component of a comprehensive MRSA colonization surveillance program. It is not intended to diagnose MRSA infection nor to guide or monitor treatment for MRSA infections. Test performance is not FDA approved in patients less than 22 years old. Performed at [facility], [address] [place], [place] [address]    Urinalysis, Routine w reflex microscopic -Urine, Clean Catch     Status: Abnormal    Collection Time: 10/10/23  3:19 PM  Result Value Ref Range    Color, Urine STRAW (A) YELLOW    APPearance CLEAR CLEAR    Specific Gravity, Urine 1.027 1.005 - 1.030    pH 6.0 5.0 - 8.0    Glucose, UA NEGATIVE NEGATIVE mg/dL    Hgb urine dipstick SMALL (A) NEGATIVE    Bilirubin Urine NEGATIVE NEGATIVE    Ketones, ur NEGATIVE NEGATIVE mg/dL    Protein, ur NEGATIVE NEGATIVE mg/dL    Nitrite NEGATIVE NEGATIVE    Leukocytes,Ua LARGE (A) NEGATIVE    RBC / HPF 11-20 0 - 5 RBC/hpf    WBC, UA >50 0 - 5 WBC/hpf    Bacteria, UA NONE SEEN NONE SEEN    Squamous  Epithelial / HPF 0-5 0 - 5 /HPF      Comment: Performed at [facility], [address], [place], [place] [address]  Rapid urine drug screen (hospital performed)     Status: Abnormal    Collection Time: 10/10/23  3:19 PM  Result Value Ref Range    Opiates NONE DETECTED NONE DETECTED    Cocaine NONE DETECTED NONE DETECTED    Benzodiazepines POSITIVE (A) NONE DETECTED    Amphetamines POSITIVE (A) NONE DETECTED    Tetrahydrocannabinol POSITIVE (A) NONE DETECTED    Barbiturates NONE DETECTED NONE DETECTED      Comment: (NOTE) DRUG SCREEN FOR MEDICAL PURPOSES ONLY.  IF CONFIRMATION IS NEEDED FOR ANY PURPOSE, NOTIFY LAB WITHIN 5 DAYS.   LOWEST DETECTABLE LIMITS FOR URINE DRUG SCREEN Drug Class                     Cutoff (ng/mL) Amphetamine and metabolites    1000 Barbiturate and metabolites    200 Benzodiazepine                 200 Opiates and metabolites        300 Cocaine and metabolites        300 THC                            50 Performed at [facility], [address] [place], [place] [address]         Imaging Results (Last 48 hours)  DG Ankle 2 Views Left   Result Date: 10/10/2023 CLINICAL DATA:  Left ankle pain EXAM: LEFT ANKLE - 2 VIEW COMPARISON:  None Available. FINDINGS: There is no evidence of fracture, dislocation, or joint effusion. There is no evidence of arthropathy or other focal bone abnormality. Soft tissues are  unremarkable. IMPRESSION: Negative. Electronically Signed   By: Charlett Nose M.D.   On: 10/10/2023 20:10    ECHOCARDIOGRAM COMPLETE   Result Date: 10/10/2023    ECHOCARDIOGRAM REPORT   Patient Name:   Etola Cassin Date of Exam: 10/10/2023 Medical Rec #:  [id number]       Height:       65.0 in Accession #:    [id number]      Weight:       122.6 lb Date of Birth:  1951/11/16       BSA:          1.607 m Patient Age:    72 years        BP:           118/72 mmHg Patient Gender: F               HR:           72 bpm. Exam Location:  Inpatient  Procedure: 2D Echo, Cardiac Doppler and Color Doppler Indications:    Stroke I63.9  History:        Patient has no prior history of Echocardiogram examinations.                 Stroke and COPD; Risk Factors:Hypertension, Dyslipidemia and                 Current Smoker.  Sonographer:    Dondra Prader RVT RCS Referring Phys: [id number] Lynnae January IMPRESSIONS  1. Left ventricular ejection fraction, by estimation, is 60 to 65%. The left ventricle has normal function. The left ventricle has no regional wall motion abnormalities. Left ventricular diastolic parameters are indeterminate.  2. Right ventricular systolic function is normal. The right ventricular size is normal. Tricuspid regurgitation signal is inadequate for assessing PA pressure.  3. The mitral valve is normal in structure. Trivial mitral valve regurgitation. No evidence of mitral stenosis.  4. The aortic valve is normal in structure. Aortic valve regurgitation is trivial. No aortic stenosis is present.  5. The inferior vena cava is normal in size with greater than 50% respiratory variability, suggesting right atrial pressure of 3 mmHg. Conclusion(s)/Recommendation(s): No intracardiac source of embolism detected on this transthoracic study. Consider a transesophageal echocardiogram to exclude cardiac source of embolism if clinically indicated. FINDINGS  Left Ventricle: Left ventricular ejection fraction, by estimation, is 60 to 65%. The left ventricle has normal function. The left ventricle has no regional wall motion abnormalities. The left ventricular internal cavity size was normal in size. There is  no left ventricular hypertrophy. Left ventricular diastolic parameters are indeterminate. Normal left ventricular filling pressure. Right Ventricle: The right ventricular size is normal. No increase in right ventricular wall thickness. Right ventricular systolic function is normal. Tricuspid regurgitation signal is inadequate for assessing PA pressure. Left  Atrium: Left atrial size was normal in size. Right Atrium: Right atrial size was normal in size. Pericardium: There is no evidence of pericardial effusion. Mitral Valve: The mitral valve is normal in structure. Trivial mitral valve regurgitation. No evidence of mitral valve stenosis. Tricuspid Valve: The tricuspid valve is normal in structure. Tricuspid valve regurgitation is trivial. No evidence of tricuspid stenosis. Aortic Valve: The aortic valve is normal in structure. Aortic valve regurgitation is trivial. No aortic stenosis is present. Aortic valve mean gradient measures 3.0 mmHg. Aortic valve peak gradient measures 5.6 mmHg. Aortic valve area, by VTI measures 2.42 cm. Pulmonic Valve: The pulmonic valve was normal in structure. Pulmonic  valve regurgitation is not visualized. No evidence of pulmonic stenosis. Aorta: The aortic root is normal in size and structure. Venous: The inferior vena cava is normal in size with greater than 50% respiratory variability, suggesting right atrial pressure of 3 mmHg. IAS/Shunts: The interatrial septum appears to be lipomatous. No atrial level shunt detected by color flow Doppler.  LEFT VENTRICLE PLAX 2D LVIDd:         4.00 cm   Diastology LVIDs:         2.70 cm   LV e' medial:    5.77 cm/s LV PW:         1.00 cm   LV E/e' medial:  13.0 LV IVS:        0.90 cm   LV e' lateral:   8.59 cm/s LVOT diam:     1.80 cm   LV E/e' lateral: 8.7 LV SV:         60 LV SV Index:   37 LVOT Area:     2.54 cm  RIGHT VENTRICLE             IVC RV S prime:     12.70 cm/s  IVC diam: 1.30 cm TAPSE (M-mode): 2.3 cm LEFT ATRIUM             Index        RIGHT ATRIUM           Index LA diam:        2.80 cm 1.74 cm/m   RA Area:     10.90 cm LA Vol (A2C):   24.1 ml 15.00 ml/m  RA Volume:   22.20 ml  13.82 ml/m LA Vol (A4C):   21.2 ml 13.20 ml/m LA Biplane Vol: 24.4 ml 15.19 ml/m  AORTIC VALVE                    PULMONIC VALVE AV Area (Vmax):    2.24 cm     PV Vmax:       0.88 m/s AV Area (Vmean):    2.31 cm     PV Peak grad:  3.1 mmHg AV Area (VTI):     2.42 cm AV Vmax:           118.00 cm/s AV Vmean:          77.800 cm/s AV VTI:            0.248 m AV Peak Grad:      5.6 mmHg AV Mean Grad:      3.0 mmHg LVOT Vmax:         104.00 cm/s LVOT Vmean:        70.700 cm/s LVOT VTI:          0.236 m LVOT/AV VTI ratio: 0.95  AORTA Ao Root diam: 2.80 cm Ao Asc diam:  3.00 cm MITRAL VALVE MV Area (PHT): 3.17 cm    SHUNTS MV Decel Time: 239 msec    Systemic VTI:  0.24 m MV E velocity: 74.90 cm/s  Systemic Diam: 1.80 cm MV A velocity: 85.90 cm/s MV E/A ratio:  0.87 Armanda Magic MD Electronically signed by Armanda Magic MD Signature Date/Time: 10/10/2023/7:15:27 PM    Final     MR BRAIN WO CONTRAST   Result Date: 10/10/2023 CLINICAL DATA:  Stroke, follow-up. EXAM: MRI HEAD WITHOUT CONTRAST TECHNIQUE: Multiplanar, multiecho pulse sequences of the brain and surrounding structures were obtained without intravenous contrast. COMPARISON:  Head CT October 10, 2023. FINDINGS:  Brain: Focus of acute intraparenchymal hemorrhage centered in the posterior limb of the right internal capsule, extending into the putamen thalamus, measuring approximately 14 x 10 mm, correlating with findings described on prior head CT. There is no significant mass effect or midline shift. No hydrocephalus. Scattered and confluent foci of T2 hyperintensity are seen within the white matter of the cerebral hemispheres and within the pons, nonspecific. Remote infarct in the right middle cerebellar peduncle/right cerebellar hemisphere. Vascular: Normal flow voids. Skull and upper cervical spine: Normal marrow signal. Sinuses/Orbits: Negative. Other: None. IMPRESSION: 1. Focus of acute intraparenchymal hemorrhage centered in the posterior limb of the right internal capsule, extending into the putamen and thalamus, correlating with findings described on prior head CT. No significant mass effect or midline shift. 2. Moderate chronic microvascular ischemic  changes of the white matter. 3. Remote infarct in the right middle cerebellar peduncle/right cerebellar hemisphere. Electronically Signed   By: Baldemar Lenis M.D.   On: 10/10/2023 15:35           Blood pressure (!) 149/85, pulse 64, temperature 98 F (36.7 C), temperature source Oral, resp. rate 13, height 5\' 4"  (1.626 m), weight 55.6 kg, SpO2 95%.   Medical Problem List and Plan: 1. Functional deficits secondary to nontraumatic ICH of th R basal ganglia, due to hypertensive emergency             -patient may shower             -ELOS/Goals: 5-7 days, Mod I PT/OT  - Stable to admit to IPR   2.  Antithrombotics: -DVT/anticoagulation:  Pharmaceutical: Heparin 5000 U Q8H - started 11/20, toelrating             -antiplatelet therapy: none   3. Pain Management: Tylenol, oxycodone as needed, Robaxin   4. Mood/Behavior/Sleep: LCSW to evaluate and provide emotional support             -Xanax 0.5 mg BID prn             -antipsychotic agents: n/a   5. Neuropsych/cognition: This patient is capable of making decisions on her own behalf.              - Adderall 30 mg BID home med held  6. Skin/Wound Care: Routine skin care checks   7. Fluids/Electrolytes/Nutrition: Routine Is and Os and follow-up chemistries   8: Hypertension: monitor TID and prn             -continue hydralazine 100 mg TID             -continue irbesartan 150 mg daily   9: Tobacco use: continue Nicoderm   10: GERD: continue Protonix   11: Likely chronic kidney disease: appears to be at baseline Cr             -follow-up CBC   12: Skin tear to left hand and left elbow: wound care orders placed   13: Left ankle strain/sprain; pain over ATF with weightbearing. xrays on admission negative.              - Ice, compression, voltaren gel QID  14. Substance use (+THC)/medication noncompliance.  provide education.      Milinda Antis, PA-C 10/12/2023  I have examined the patient independently and  edited the note for HPI, ROS, exam, assessment, and plan as appropriate. I am in agreement with the above recommendations.   Angelina Sheriff, DO 10/12/2023

## 2023-10-13 ENCOUNTER — Ambulatory Visit: Payer: Medicare Other | Admitting: Internal Medicine

## 2023-10-13 DIAGNOSIS — I610 Nontraumatic intracerebral hemorrhage in hemisphere, subcortical: Secondary | ICD-10-CM | POA: Diagnosis not present

## 2023-10-13 LAB — CBC WITH DIFFERENTIAL/PLATELET
Abs Immature Granulocytes: 0.02 10*3/uL (ref 0.00–0.07)
Basophils Absolute: 0 10*3/uL (ref 0.0–0.1)
Basophils Relative: 1 %
Eosinophils Absolute: 0.1 10*3/uL (ref 0.0–0.5)
Eosinophils Relative: 2 %
HCT: 37.6 % (ref 36.0–46.0)
Hemoglobin: 11.9 g/dL — ABNORMAL LOW (ref 12.0–15.0)
Immature Granulocytes: 0 %
Lymphocytes Relative: 18 %
Lymphs Abs: 1.2 10*3/uL (ref 0.7–4.0)
MCH: 28.1 pg (ref 26.0–34.0)
MCHC: 31.6 g/dL (ref 30.0–36.0)
MCV: 88.7 fL (ref 80.0–100.0)
Monocytes Absolute: 0.5 10*3/uL (ref 0.1–1.0)
Monocytes Relative: 7 %
Neutro Abs: 4.9 10*3/uL (ref 1.7–7.7)
Neutrophils Relative %: 72 %
Platelets: 294 10*3/uL (ref 150–400)
RBC: 4.24 MIL/uL (ref 3.87–5.11)
RDW: 12.9 % (ref 11.5–15.5)
WBC: 6.8 10*3/uL (ref 4.0–10.5)
nRBC: 0 % (ref 0.0–0.2)

## 2023-10-13 LAB — COMPREHENSIVE METABOLIC PANEL
ALT: 69 U/L — ABNORMAL HIGH (ref 0–44)
AST: 32 U/L (ref 15–41)
Albumin: 3.3 g/dL — ABNORMAL LOW (ref 3.5–5.0)
Alkaline Phosphatase: 71 U/L (ref 38–126)
Anion gap: 10 (ref 5–15)
BUN: 24 mg/dL — ABNORMAL HIGH (ref 8–23)
CO2: 21 mmol/L — ABNORMAL LOW (ref 22–32)
Calcium: 9 mg/dL (ref 8.9–10.3)
Chloride: 102 mmol/L (ref 98–111)
Creatinine, Ser: 1.71 mg/dL — ABNORMAL HIGH (ref 0.44–1.00)
GFR, Estimated: 31 mL/min — ABNORMAL LOW (ref >60)
Glucose, Bld: 122 mg/dL — ABNORMAL HIGH (ref 70–99)
Potassium: 4.4 mmol/L (ref 3.5–5.1)
Sodium: 133 mmol/L — ABNORMAL LOW (ref 135–145)
Total Bilirubin: 0.5 mg/dL (ref <1.2)
Total Protein: 6.1 g/dL — ABNORMAL LOW (ref 6.5–8.1)

## 2023-10-13 MED ORDER — CEPHALEXIN 250 MG PO CAPS
500.0000 mg | ORAL_CAPSULE | Freq: Every day | ORAL | Status: AC
  Administered 2023-10-13 – 2023-10-17 (×5): 500 mg via ORAL
  Filled 2023-10-13 (×5): qty 2

## 2023-10-13 MED ORDER — ALPRAZOLAM 0.5 MG PO TABS
1.0000 mg | ORAL_TABLET | Freq: Two times a day (BID) | ORAL | Status: DC | PRN
  Administered 2023-10-13 – 2023-10-14 (×2): 1 mg via ORAL
  Filled 2023-10-13 (×3): qty 2

## 2023-10-13 MED ORDER — OXYCODONE-ACETAMINOPHEN 5-325 MG PO TABS
1.0000 | ORAL_TABLET | Freq: Three times a day (TID) | ORAL | Status: DC | PRN
  Administered 2023-10-13 – 2023-10-19 (×9): 1 via ORAL
  Filled 2023-10-13 (×9): qty 1

## 2023-10-13 NOTE — Evaluation (Signed)
Physical Therapy Assessment and Plan  Patient Details  Name: Kathy Vargas MRN: [id number] Date of Birth: 1951/07/19  PT Diagnosis: Abnormality of gait, Difficulty walking, Hemiplegia non-dominant, and Pain in L ankle Rehab Potential: Excellent ELOS: 5-7 days   Today's Date: 10/13/2023 PT Individual Time: 0900-1000 PT Individual Time Calculation (min): 60 min    Hospital Problem: Principal Problem:   ICH (intracerebral hemorrhage) (HCC)   Past Medical History:  Past Medical History:  Diagnosis Date   ABDOMINAL TENDERNESS 01/21/2011   ACUTE GINGIVITIS NONPLAQUE INDUCED 06/11/2010   ADD 09/05/2008   Alcohol abuse, in remission 09/27/2007   ALLERGIC RHINITIS 03/02/2009   Anemia    ANXIETY 07/02/2007   BACK PAIN 05/19/2009   CHRONIC OBSTRUCTIVE PULMONARY DISEASE, ACUTE EXACERBATION 08/06/2009   COPD 07/02/2007   DEGENERATIVE JOINT DISEASE, CERVICAL SPINE 07/02/2007   DEPRESSION 07/02/2007   Dyspnea    GASTROENTERITIS, VIRAL 11/29/2010   GERD 01/21/2011   Headache(784.0) 07/02/2007   History of kidney stones    Hypertension    HYPOTHYROIDISM 07/02/2007   Lumbar disc disease    Lumbar pain with radiation down right leg 06/01/2011   OSTEOARTHRITIS 07/02/2007   Other and unspecified ovarian cyst 05/19/2009   PAIN, CHRONIC, DUE TO TRAUMA 07/02/2007   PELVIC  PAIN 03/29/2010   UTI 05/19/2009   Vitamin D insufficiency    Past Surgical History:  Past Surgical History:  Procedure Laterality Date   ABDOMINAL HYSTERECTOMY     CATARACT EXTRACTION W/ INTRAOCULAR LENS IMPLANT Bilateral    CHOLECYSTECTOMY     CYSTOSCOPY/URETEROSCOPY/HOLMIUM LASER/STENT PLACEMENT Left 07/01/2022   Procedure: CYSTOSCOPY/ RETROGRADE/URETEROSCOPY/STENT PLACEMENT;  Surgeon: Jannifer Hick, MD;  Location: [facility];  Service: Urology;  Laterality: Left;   eyebrow lift     IR URETERAL STENT LEFT NEW ACCESS W/O SEP NEPHROSTOMY CATH  07/01/2022   LIPOSUCTION     LUMBAR DISC SURGERY     L4, L5    NEPHROLITHOTOMY Left 07/01/2022   Procedure: NEPHROLITHOTOMY PERCUTANEOUS/ INTERVENTIONAL RADIOLOGY TO PLACE LEFT NEPHROSTOMY TUBE PRIOR;  Surgeon: Jannifer Hick, MD;  Location: [facility];  Service: Urology;  Laterality: Left;  ONLY NEEDS 120 MIN FOR ALL PROCEDURES   URETEROSCOPY WITH HOLMIUM LASER LITHOTRIPSY Right 04/12/2022   Procedure: URETEROSCOPY WITH HOLMIUM LASER LITHOTRIPSY, STENT PLACEMENT;  Surgeon: Despina Arias, MD;  Location: [facility];  Service: Urology;  Laterality: Right;    Assessment & Plan Clinical Impression: Patient is a 72 year old who presented to the ED on 10/11/2023 with left sided weakness and baseline slurred speech. Code stroke initiated. CT head revealed acute 1.7 cm intraparenchymal hemorrhage centered in the posterior limb of the right internal capsule. No substantial mass effect. Patient was hypertensive with SBP in the 200s. Cleviprex started. Now on oral home meds. CTA no LVO. Started on heparin for DVT prophy. Bumped left ankle when exiting car>>x-rays negative. Tolerating diet. PMH significant for hypertension, chronic pain, DJD, previous L-S surgery, prior alcohol use.She has elevated BUN/Cr>> question AKI versus CKD. No diagnosis of this in her chart or note from PCP in May. [place] system chemistry panel remarkable for elevated serum creatinine dating back to April 2023 with baseline around 2.1. Primary complaint is left lateral ankle tenderness and pain with weight-bearing. Requests new dressing to left hand skin tear. The patient requires inpatient medicine and rehabilitation evaluations and services for ongoing dysfunction secondary to ICH. Patient transferred to CIR on 10/12/2023 .   Patient currently requires  CGA  with mobility secondary to  muscle weakness, decreased cardiorespiratoy endurance, and decreased standing balance, hemiplegia, and decreased balance strategies.  Prior to hospitalization, patient was independent  with mobility and lived with Alone in  a Apartment home.  Home access is 5Stairs to enter.  Patient will benefit from skilled PT intervention to maximize safe functional mobility, minimize fall risk, and decrease caregiver burden for planned discharge home with intermittent assist.  Anticipate patient will benefit from follow up OP at discharge.  PT - End of Session Activity Tolerance: Tolerates 10 - 20 min activity with multiple rests Endurance Deficit: Yes PT Assessment Rehab Potential (ACUTE/IP ONLY): Excellent PT Barriers to Discharge: Home environment access/layout;Insurance for SNF coverage;Other (comments) PT Barriers to Discharge Comments: L ankle pain PT Patient demonstrates impairments in the following area(s): Balance;Endurance;Edema;Motor PT Transfers Functional Problem(s): Bed Mobility;Bed to Chair;Car PT Locomotion Functional Problem(s): Ambulation;Stairs PT Plan PT Intensity: Minimum of 1-2 x/day ,45 to 90 minutes PT Frequency: 5 out of 7 days PT Duration Estimated Length of Stay: 5-7 days PT Treatment/Interventions: Ambulation/gait training;Cognitive remediation/compensation;Discharge planning;DME/adaptive equipment instruction;Functional mobility training;Pain management;Psychosocial support;Splinting/orthotics;Therapeutic Activities;UE/LE Strength taining/ROM;Wheelchair propulsion/positioning;Therapeutic Exercise;Stair training;UE/LE Coordination activities;Skin care/wound management;Patient/family education;Neuromuscular re-education;Functional electrical stimulation;Disease management/prevention;Community reintegration;Balance/vestibular training PT Transfers Anticipated Outcome(s): mod I PT Locomotion Anticipated Outcome(s): mod I PT Recommendation Recommendations for Other Services: None Follow Up Recommendations: Outpatient PT Patient destination: Home Equipment Recommended: To be determined   PT Evaluation Precautions/Restrictions Precautions Precautions: Fall Precaution Comments: L ankle  sprain Restrictions Weight Bearing Restrictions: No Pain Interference Pain Interference Pain Effect on Sleep: 1. Rarely or not at all Pain Interference with Therapy Activities: 4. Almost constantly Pain Interference with Day-to-Day Activities: 4. Almost constantly Home Living/Prior Functioning Home Living Living Arrangements: Alone Available Help at Discharge: Family;Available 24 hours/day Type of Home: Apartment Home Access: Stairs to enter Entrance Stairs-Number of Steps: 5 Entrance Stairs-Rails: Right;Can reach both;Left Home Layout: One level Bathroom Shower/Tub: Engineer, manufacturing systems: Standard Bathroom Accessibility: Yes  Lives With: Alone Prior Function Level of Independence: Independent with transfers;Independent with basic ADLs;Independent with gait  Able to Take Stairs?: Yes Driving: Yes Vocation: Full time employment Vocation Requirements: Housekeeper Vision/Perception  Vision - History Ability to See in Adequate Light: 0 Adequate Perception Perception: Within Functional Limits Praxis Praxis: WFL  Cognition Overall Cognitive Status: Within Functional Limits for tasks assessed Arousal/Alertness: Awake/alert Orientation Level: Oriented X4 Memory: Appears intact Awareness: Appears intact Problem Solving: Appears intact Safety/Judgment: Appears intact Sensation Sensation Light Touch: Appears Intact Hot/Cold: Appears Intact Proprioception: Appears Intact Stereognosis: Appears Intact Coordination Gross Motor Movements are Fluid and Coordinated: No Coordination and Movement Description: Limited more by L ankle pain > weakness Motor  Motor Motor: Hemiplegia Motor - Skilled Clinical Observations: mild L hemi   Trunk/Postural Assessment  Cervical Assessment Cervical Assessment: Within Functional Limits Thoracic Assessment Thoracic Assessment: Within Functional Limits Lumbar Assessment Lumbar Assessment: Within Functional Limits Postural  Control Postural Control: Within Functional Limits  Balance Balance Balance Assessed: Yes Static Sitting Balance Static Sitting - Balance Support: Feet supported;No upper extremity supported Static Sitting - Level of Assistance: 7: Independent Dynamic Sitting Balance Dynamic Sitting - Balance Support: Feet supported;No upper extremity supported Dynamic Sitting - Level of Assistance: 5: Stand by assistance Static Standing Balance Static Standing - Balance Support: Bilateral upper extremity supported Static Standing - Level of Assistance: 5: Stand by assistance Dynamic Standing Balance Dynamic Standing - Balance Support: Bilateral upper extremity supported;During functional activity Dynamic Standing - Level of Assistance: 4: Min assist Extremity Assessment      RLE  Assessment RLE Assessment: Within Functional Limits LLE Assessment LLE Assessment: Exceptions to [employer] [facility] General Strength Comments: Grossly 4/5  Care Tool Care Tool Bed Mobility Roll left and right activity   Roll left and right assist level: Independent    Sit to lying activity   Sit to lying assist level: Supervision/Verbal cueing    Lying to sitting on side of bed activity   Lying to sitting on side of bed assist level: the ability to move from lying on the back to sitting on the side of the bed with no back support.: Supervision/Verbal cueing     Care Tool Transfers Sit to stand transfer   Sit to stand assist level: Contact Guard/Touching assist    Chair/bed transfer   Chair/bed transfer assist level: Contact Guard/Touching assist    Car transfer   Car transfer assist level: Contact Guard/Touching assist      Care Tool Locomotion Ambulation   Assist level: Contact Guard/Touching assist Assistive device: Walker-rolling Max distance: 33ft  Walk 10 feet activity   Assist level: Contact Guard/Touching assist Assistive device: Walker-rolling   Walk 50 feet with 2 turns activity   Assist level: Contact  Guard/Touching assist Assistive device: Walker-rolling  Walk 150 feet activity Walk 150 feet activity did not occur: Safety/medical concerns (L ankle pain)      Walk 10 feet on uneven surfaces activity Walk 10 feet on uneven surfaces activity did not occur: Safety/medical concerns      Stairs   Assist level: Contact Guard/Touching assist Stairs assistive device: 2 hand rails Max number of stairs: 4  Walk up/down 1 step activity   Walk up/down 1 step (curb) assist level: Contact Guard/Touching assist Walk up/down 1 step or curb assistive device: 2 hand rails  Walk up/down 4 steps activity   Walk up/down 4 steps assist level: Contact Guard/Touching assist Walk up/down 4 steps assistive device: 2 hand rails  Walk up/down 12 steps activity Walk up/down 12 steps activity did not occur: Safety/medical concerns (L ankle pain)      Pick up small objects from floor Pick up small object from the floor (from standing position) activity did not occur: Safety/medical concerns      Wheelchair Is the patient using a wheelchair?: Yes Type of Wheelchair: Manual   Wheelchair assist level: Supervision/Verbal cueing Max wheelchair distance: 150'  Wheel 50 feet with 2 turns activity   Assist Level: Supervision/Verbal cueing  Wheel 150 feet activity   Assist Level: Supervision/Verbal cueing    Refer to Care Plan for Long Term Goals  SHORT TERM GOAL WEEK 1 PT Short Term Goal 1 (Week 1): STG = LTG due to ELOS  Recommendations for other services: None   Skilled Therapeutic Intervention Mobility Bed Mobility Bed Mobility: Supine to Sit;Sit to Supine Supine to Sit: Supervision/Verbal cueing Sit to Supine: Supervision/Verbal cueing Transfers Transfers: Sit to Stand;Stand to Sit;Stand Pivot Transfers Sit to Stand: Contact Guard/Touching assist Stand to Sit: Contact Guard/Touching assist Stand Pivot Transfers: Contact Guard/Touching assist Transfer (Assistive device): None Locomotion   Gait Ambulation: Yes Gait Assistance: Contact Guard/Touching assist Gait Distance (Feet): 70 Feet Assistive device: Rolling walker Gait Gait: Yes Gait Pattern: Impaired Gait Pattern: Step-to pattern;Poor foot clearance - left;Trunk flexed;Antalgic Stairs / Additional Locomotion Stairs: Yes Stairs Assistance: Contact Guard/Touching assist Stair Management Technique: Two rails;Step to pattern;Forwards Number of Stairs: 4 Height of Stairs: 6 Wheelchair Mobility Wheelchair Mobility: Yes Wheelchair Assistance: Doctor, general practice: Both upper extremities Wheelchair Parts Management: Needs assistance Distance: 150'  Skilled treatment: Pt in bed to start and agreeable to PT evaluation. Reports subacute L ankle pain related to when she fell out of the car when she had her CVA. Team is aware of ankle pain and swelling, XR negative and believed to be a sprain/strain. Ace wrapped L ankle for stability and provided ice at end of session. Educated her on R.I.C.E to help with management.   Functional mobility completed as outlined above. Overall requires CGA for stand pivot transfers and CGA for gait using RW for AD. Gait is antalgic with a step-to gait pattern, painful weightbearing to LLE. Passive ROM assessment of the ankle revealing likely ATFL sprain.   Returned to her room and patient assisted to the recliner and ended treatment with BLE elevated and ice applied to lateral side of her L ankle. Chair pad alarm on.   She presents with minimal CVA deficits, primarily limited by her ankle sprain. She's very active baseline and is eager to return home to her independence. Anticipate she will do well and progress quickly. She has available assist from her daughter if needed.      Discharge Criteria: Patient will be discharged from PT if patient refuses treatment 3 consecutive times without medical reason, if treatment goals not met, if there is a change in medical  status, if patient makes no progress towards goals or if patient is discharged from hospital.  The above assessment, treatment plan, treatment alternatives and goals were discussed and mutually agreed upon: by patient  Orrin Brigham  PT, DPT, CSRS  10/13/2023, 10:02 AM

## 2023-10-13 NOTE — Progress Notes (Signed)
Patient ID: Kathy Vargas, female   DOB: 07-Oct-1951, 72 y.o.   MRN: [id number] Met with the patient to review current situation, rehab schedule, team conference and plan of care. Reviewed secondary risk management including smoking, HTN, HLD with medications and dietary modification recommendations. Patient noted left ankle pain, ? Brace or air cast, family brought in shoes.  Nicoderm patch on. Reported hx. Of panic attacks and depression; noted discrepancy in current medication discrepancy; MD made aware.  Continue to follow along to address educational needs for preparation for discharge. Goal is to go home solo; 5 ste however can go to dtrs' home (2 ste) if needed.  Pamelia Hoit

## 2023-10-13 NOTE — Discharge Summary (Signed)
Physician Discharge Summary  Patient ID: Kathy Vargas MRN: [id number] DOB/AGE: 12-25-1950 72 y.o.  Admit date: 10/12/2023 Discharge date: 10/21/2023  Discharge Diagnoses:  Principal Problem:   ICH (intracerebral hemorrhage) (HCC) Active problems: Basal ganglia ICH Skin tear dorsum left hand Sprain/strain left ankle Hypertension Tobacco use Gastroesophageal reflux disease Acute kidney injury versus chronic kidney disease Urinary incontinence Constipation Insomnia Substance abuse  Discharged Condition: stable  Significant Diagnostic Studies: none  Labs:  Basic Metabolic Panel: Recent Labs  Lab 10/16/23 0515 10/17/23 0513 10/18/23 0446 10/20/23 0345  NA 132* 131* 134* 134*  K 4.2 4.7 4.4 3.9  CL 103 104 104 103  CO2 20* 19* 22 21*  GLUCOSE 121* 102* 113* 117*  BUN 35* 37* 29* 30*  CREATININE 2.24* 2.02* 1.85* 1.87*  CALCIUM 9.2 9.4 9.7 9.6    CBC: Recent Labs  Lab 10/16/23 0515  WBC 5.6  HGB 12.3  HCT 39.1  MCV 89.5  PLT 295    Brief HPI:   Kathy Vargas is a 72 y.o. female who presented to the ED on 10/11/2023 with left sided weakness and baseline slurred speech. Code stroke initiated. CT head revealed acute 1.7 cm intraparenchymal hemorrhage centered in the posterior limb of the right internal capsule. No substantial mass effect. Patient was hypertensive with SBP in the 200s. Cleviprex started. Now on oral home meds. CTA no LVO. Started on heparin for DVT prophy. Bumped left ankle when exiting car>>x-rays negative. Tolerating diet. PMH significant for hypertension, chronic pain, DJD, previous L-S surgery, prior alcohol use.She has elevated BUN/Cr>> question AKI versus CKD. No diagnosis of this in her chart or note from PCP in May. [place] system chemistry panel remarkable for elevated serum creatinine dating back to April 2023 with baseline around 2.1. Primary complaint is left lateral ankle tenderness and pain with weight-bearing. Requests new dressing  to left hand skin tear.    Hospital Course: Kathy Vargas was admitted to rehab 10/12/2023 for inpatient therapies to consist of PT, ST and OT at least three hours five days a week. Past admission physiatrist, therapy team and rehab RN have worked together to provide customized collaborative inpatient rehab. Complained of insomnia and Xanax increased to 1 mg. Percocet for ankle pain. Melatonin 5 mg added nightly. Chest tightness reported on 11/24 and EKG obtained.  T wave inversions noted which were consistent with prior studies, no apparent changes.  The patient requested resumption of her Adderall 30 mg twice daily per her home regimen.  Risks of tachycardia and hypertension that accompany this, along with recent stroke explained need to decline this request at this time.  Changed Xanax dosing to 0.5 to 1 mg twice daily as needed so that she can adjust to lower dose and avoid oversedation as desired.  Left hip pain>>she declined x-ray examination. ASO ordered for left ankle. Follow-up labs on 11/25 with increase in BUN/Cr to 35/2.24. Continuous IVF initiated and Avapro held with improvement in Cr to 2.02, BUN up slightly to 37. Swedish knee cage ordered for left knee.  NicoDerm continued for tobacco Wi[place] and added Claritin and DuoNebs as needed for shortness of breath and wheezing.  Sleep improved.  Follow-up BMP on 11/27 with improvement of BUN and creatinine to 29/1.85. Unchanged on 11/29.   Blood pressures were monitored on TID basis and hydralazine 100 mg 3 times daily and irbesartan 150 mg daily continued. Systolic BP consistently in the high 140s and added Norvasc 5 mg daily on 11/23. Avapro held due to  elevated BUN/Cr on 11/25.   Rehab course: During patient's stay in rehab weekly team conferences were held to monitor patient's progress, set goals and discuss barriers to discharge. At admission, patient required CGA  with mobility and min with basic self-care skills.  She  has had improvement in  activity tolerance, balance, postural control as well as ability to compensate for deficits. She has had improvement in functional use RUE/LUE  and RLE/LLE as well as improvement in awareness. Patient has met 6 of 6 long term goals due to improved activity tolerance, improved balance, ability to compensate for deficits, functional use of  LEFT lower extremity, and improved coordination.  Patient to discharge at overall Modified Independent level.  Patient's care partner is independent to provide the necessary physical assistance at discharge.      Discharge disposition: 06-Home-Health Care Svc     Diet: heart healthy  Special Instructions: No driving, alcohol consumption or tobacco use.  Do not resume Adderall until follow-up with neurology.  Do not resume irbesartan until follow-up with PCP. Recommend outpatient nephrology/urology follow-up for kidney function.  Discharge Instructions     Ambulatory referral to Neurology   Complete by: As directed    An appointment is requested in approximately: 4 weeks   Ambulatory referral to Physical Medicine Rehab   Complete by: As directed    Hospital follow-up   Discharge patient   Complete by: As directed    Discharge disposition: 06-Home-Health Care Svc   Discharge patient date: 10/20/2023      Allergies as of 10/20/2023       Reactions   Morphine Nausea And Vomiting   Ceftin [cefuroxime]    Nausea and vomiting        Medication List     STOP taking these medications    amphetamine-dextroamphetamine 30 MG tablet Commonly known as: ADDERALL   calcium carbonate 500 MG chewable tablet Commonly known as: TUMS - dosed in mg elemental calcium   irbesartan 150 MG tablet Commonly known as: Avapro       TAKE these medications    acetaminophen 325 MG tablet Commonly known as: TYLENOL Take 1-2 tablets (325-650 mg total) by mouth every 4 (four) hours as needed for mild pain (pain score 1-3). What changed:  how much to  take reasons to take this   ALPRAZolam 0.5 MG tablet Commonly known as: XANAX Take 1 tablet (0.5 mg) by mouth 2 times daily as needed.   amLODipine 5 MG tablet Commonly known as: NORVASC Take 1 tablet (5 mg total) by mouth daily.   CAL-MAG-ZINC PO Take 1 tablet by mouth at bedtime.   cyanocobalamin 1000 MCG tablet Commonly known as: VITAMIN B12 Take 1 tablet (1,000 mcg total) by mouth daily.   diclofenac Sodium 1 % Gel Commonly known as: VOLTAREN Apply 2 g topically 4 (four) times daily.   fluticasone 0.005 % ointment Commonly known as: CUTIVATE Apply to the skin 2 times daily if needed   fluticasone-salmeterol 250-50 MCG/ACT Aepb Commonly known as: Advair Diskus Inhale 1 puff into the lungs 2 (two) times daily as needed (Asthma).   HAIR/SKIN/NAILS PO Take 1 tablet by mouth daily.   hydrALAZINE 100 MG tablet Commonly known as: APRESOLINE Take 1 tablet (100 mg total) by mouth 3 (three) times daily.   loratadine 10 MG tablet Commonly known as: CLARITIN Take 1 tablet (10 mg total) by mouth daily.   nicotine 21 mg/24hr patch Commonly known as: NICODERM CQ - dosed in mg/24 hours Place  1 patch (21 mg total) onto the skin daily.   pantoprazole 40 MG tablet Commonly known as: PROTONIX Take 1 tablet (40 mg total) by mouth daily.   senna-docusate 8.6-50 MG tablet Commonly known as: Senokot-S Take 2 tablets by mouth 2 (two) times daily.   VITAMIN D3 PO Take 1 tablet by mouth in the morning.        Follow-up Information     Corwin Levins, MD. Go to.   Specialties: Internal Medicine, Radiology Contact information: [address] [place] [address] [phone]         Horton Chin, MD Follow up.   Specialty: Physical Medicine and Rehabilitation Why: office will call you to arrange your appt (sent) Contact information: [address] [place] [address] [phone]         [facility] Follow up.   Why:  Call the office on Monday to make arrangements for hospital follow-up appointment. Contact information: [address]     [address] [place] [address] [phone]                Signed: Milinda Antis 10/20/2023, 4:22 PM

## 2023-10-13 NOTE — Progress Notes (Signed)
Inpatient Rehabilitation Center Individual Statement of Services  Patient Name:  Kathy Vargas  Date:  10/13/2023  Welcome to the Inpatient Rehabilitation [facility].  Our goal is to provide you with an individualized program based on your diagnosis and situation, designed to meet your specific needs.  With this comprehensive rehabilitation program, you will be expected to participate in at least 3 hours of rehabilitation therapies Monday-Friday, with modified therapy programming on the weekends.  Your rehabilitation program will include the following services:  Physical Therapy (PT), Occupational Therapy (OT), 24 hour per day rehabilitation nursing, Therapeutic Recreaction (TR), Neuropsychology, Care Coordinator, Rehabilitation Medicine, Nutrition Services, and Pharmacy Services  Weekly team conferences will be held on Wednesday to discuss your progress.  Your Inpatient Rehabilitation Care Coordinator will talk with you frequently to get your input and to update you on team discussions.  Team conferences with you and your family in attendance may also be held.  Expected length of stay: 5-7 days  Overall anticipated outcome: Independent with device  Depending on your progress and recovery, your program may change. Your Inpatient Rehabilitation Care Coordinator will coordinate services and will keep you informed of any changes. Your Inpatient Rehabilitation Care Coordinator's name and contact numbers are listed  below.  The following services may also be recommended but are not provided by the Inpatient Rehabilitation Center:  Driving Evaluations Home Health Rehabiltiation Services Outpatient Rehabilitation Services Vocational Rehabilitation   Arrangements will be made to provide these services after discharge if needed.  Arrangements include referral to agencies that provide these services.  Your insurance has been verified to be:  [employer]-Medicare Your primary doctor is:  Oliver Barre  Pertinent  information will be shared with your doctor and your insurance company.  Inpatient Rehabilitation Care Coordinator:  Dossie Der, [place] [phone] or Luna Glasgow  Information discussed with and copy given to patient by: Lucy Chris, 10/13/2023, 9:17 AM

## 2023-10-13 NOTE — Progress Notes (Signed)
Inpatient Rehabilitation  Patient information reviewed and entered into eRehab system by Oyuki Hogan M. Eri Mcevers, M.A., CCC/SLP, PPS Coordinator.  Information including medical coding, functional ability and quality indicators will be reviewed and updated through discharge.    

## 2023-10-13 NOTE — Plan of Care (Signed)
  Problem: RH Balance Goal: LTG Patient will maintain dynamic standing balance (PT) Description: LTG:  Patient will maintain dynamic standing balance with assistance during mobility activities (PT) Flowsheets (Taken 10/13/2023 1002) LTG: Pt will maintain dynamic standing balance during mobility activities with:: Independent with assistive device    Problem: Sit to Stand Goal: LTG:  Patient will perform sit to stand with assistance level (PT) Description: LTG:  Patient will perform sit to stand with assistance level (PT) Flowsheets (Taken 10/13/2023 1002) LTG: PT will perform sit to stand in preparation for functional mobility with assistance level: Independent with assistive device   Problem: RH Bed Mobility Goal: LTG Patient will perform bed mobility with assist (PT) Description: LTG: Patient will perform bed mobility with assistance, with/without cues (PT). Flowsheets (Taken 10/13/2023 1002) LTG: Pt will perform bed mobility with assistance level of: Independent   Problem: RH Bed to Chair Transfers Goal: LTG Patient will perform bed/chair transfers w/assist (PT) Description: LTG: Patient will perform bed to chair transfers with assistance (PT). Flowsheets (Taken 10/13/2023 1002) LTG: Pt will perform Bed to Chair Transfers with assistance level: Independent with assistive device    Problem: RH Car Transfers Goal: LTG Patient will perform car transfers with assist (PT) Description: LTG: Patient will perform car transfers with assistance (PT). Flowsheets (Taken 10/13/2023 1002) LTG: Pt will perform car transfers with assist:: Set up assist    Problem: RH Ambulation Goal: LTG Patient will ambulate in controlled environment (PT) Description: LTG: Patient will ambulate in a controlled environment, # of feet with assistance (PT). Flowsheets (Taken 10/13/2023 1002) LTG: Pt will ambulate in controlled environ  assist needed:: Independent with assistive device LTG: Ambulation distance in  controlled environment: 162ft Goal: LTG Patient will ambulate in home environment (PT) Description: LTG: Patient will ambulate in home environment, # of feet with assistance (PT). Flowsheets (Taken 10/13/2023 1002) LTG: Pt will ambulate in home environ  assist needed:: Independent with assistive device LTG: Ambulation distance in home environment: 39ft   Problem: RH Stairs Goal: LTG Patient will ambulate up and down stairs w/assist (PT) Description: LTG: Patient will ambulate up and down # of stairs with assistance (PT) Flowsheets (Taken 10/13/2023 1002) LTG: Pt will ambulate up/down stairs assist needed:: Supervision/Verbal cueing LTG: Pt will  ambulate up and down number of stairs: at least 5 with 2 rails

## 2023-10-13 NOTE — Plan of Care (Signed)
  Problem: Sit to Stand Goal: LTG:  Patient will perform sit to stand in prep for activites of daily living with assistance level (OT) Description: LTG:  Patient will perform sit to stand in prep for activites of daily living with assistance level (OT) Flowsheets (Taken 10/13/2023 1322) LTG: PT will perform sit to stand in prep for activites of daily living with assistance level: Independent with assistive device   Problem: RH Bathing Goal: LTG Patient will bathe all body parts with assist levels (OT) Description: LTG: Patient will bathe all body parts with assist levels (OT) Flowsheets (Taken 10/13/2023 1322) LTG: Pt will perform bathing with assistance level/cueing: Independent with assistive device    Problem: RH Dressing Goal: LTG Patient will perform lower body dressing w/assist (OT) Description: LTG: Patient will perform lower body dressing with assist, with/without cues in positioning using equipment (OT) Flowsheets (Taken 10/13/2023 1322) LTG: Pt will perform lower body dressing with assistance level of: Independent with assistive device   Problem: RH Toileting Goal: LTG Patient will perform toileting task (3/3 steps) with assistance level (OT) Description: LTG: Patient will perform toileting task (3/3 steps) with assistance level (OT)  Flowsheets (Taken 10/13/2023 1322) LTG: Pt will perform toileting task (3/3 steps) with assistance level: Independent with assistive device   Problem: RH Toilet Transfers Goal: LTG Patient will perform toilet transfers w/assist (OT) Description: LTG: Patient will perform toilet transfers with assist, with/without cues using equipment (OT) Flowsheets (Taken 10/13/2023 1322) LTG: Pt will perform toilet transfers with assistance level of: Independent with assistive device   Problem: RH Tub/Shower Transfers Goal: LTG Patient will perform tub/shower transfers w/assist (OT) Description: LTG: Patient will perform tub/shower transfers with assist,  with/without cues using equipment (OT) Flowsheets (Taken 10/13/2023 1322) LTG: Pt will perform tub/shower stall transfers with assistance level of: Independent with assistive device

## 2023-10-13 NOTE — Evaluation (Signed)
Occupational Therapy Assessment and Plan  Patient Details  Name: Kathy Vargas MRN: [id number] Date of Birth: Jun 03, 1951  OT Diagnosis: acute pain, hemiplegia affecting non-dominant side, and pain in joint Rehab Potential: Rehab Potential (ACUTE ONLY): Good ELOS: 5-7 days   Today's Date: 10/13/2023 OT Individual Time: 3295-1884 OT Individual Time Calculation (min): 63 min     Hospital Problem: Principal Problem:   ICH (intracerebral hemorrhage) (HCC)   Past Medical History:  Past Medical History:  Diagnosis Date   ABDOMINAL TENDERNESS 01/21/2011   ACUTE GINGIVITIS NONPLAQUE INDUCED 06/11/2010   ADD 09/05/2008   Alcohol abuse, in remission 09/27/2007   ALLERGIC RHINITIS 03/02/2009   Anemia    ANXIETY 07/02/2007   BACK PAIN 05/19/2009   CHRONIC OBSTRUCTIVE PULMONARY DISEASE, ACUTE EXACERBATION 08/06/2009   COPD 07/02/2007   DEGENERATIVE JOINT DISEASE, CERVICAL SPINE 07/02/2007   DEPRESSION 07/02/2007   Dyspnea    GASTROENTERITIS, VIRAL 11/29/2010   GERD 01/21/2011   Headache(784.0) 07/02/2007   History of kidney stones    Hypertension    HYPOTHYROIDISM 07/02/2007   Lumbar disc disease    Lumbar pain with radiation down right leg 06/01/2011   OSTEOARTHRITIS 07/02/2007   Other and unspecified ovarian cyst 05/19/2009   PAIN, CHRONIC, DUE TO TRAUMA 07/02/2007   PELVIC  PAIN 03/29/2010   UTI 05/19/2009   Vitamin D insufficiency    Past Surgical History:  Past Surgical History:  Procedure Laterality Date   ABDOMINAL HYSTERECTOMY     CATARACT EXTRACTION W/ INTRAOCULAR LENS IMPLANT Bilateral    CHOLECYSTECTOMY     CYSTOSCOPY/URETEROSCOPY/HOLMIUM LASER/STENT PLACEMENT Left 07/01/2022   Procedure: CYSTOSCOPY/ RETROGRADE/URETEROSCOPY/STENT PLACEMENT;  Surgeon: Jannifer Hick, MD;  Location: [facility];  Service: Urology;  Laterality: Left;   eyebrow lift     IR URETERAL STENT LEFT NEW ACCESS W/O SEP NEPHROSTOMY CATH  07/01/2022   LIPOSUCTION     LUMBAR DISC SURGERY     L4,  L5   NEPHROLITHOTOMY Left 07/01/2022   Procedure: NEPHROLITHOTOMY PERCUTANEOUS/ INTERVENTIONAL RADIOLOGY TO PLACE LEFT NEPHROSTOMY TUBE PRIOR;  Surgeon: Jannifer Hick, MD;  Location: [facility];  Service: Urology;  Laterality: Left;  ONLY NEEDS 120 MIN FOR ALL PROCEDURES   URETEROSCOPY WITH HOLMIUM LASER LITHOTRIPSY Right 04/12/2022   Procedure: URETEROSCOPY WITH HOLMIUM LASER LITHOTRIPSY, STENT PLACEMENT;  Surgeon: Despina Arias, MD;  Location: [facility];  Service: Urology;  Laterality: Right;    Assessment & Plan Clinical Impression: Kathy Vargas is a 72 year old who presented to the ED on 10/11/2023 with left sided weakness and baseline slurred speech. Code stroke initiated. CT head revealed acute 1.7 cm intraparenchymal hemorrhage centered in the posterior limb of the right internal capsule. No substantial mass effect. Patient was hypertensive with SBP in the 200s. Cleviprex started. Now on oral home meds. CTA no LVO. Started on heparin for DVT prophy. Bumped left ankle when exiting car>>x-rays negative. Tolerating diet. PMH significant for hypertension, chronic pain, DJD, previous L-S surgery, prior alcohol use.She has elevated BUN/Cr>> question AKI versus CKD. No diagnosis of this in her chart or note from PCP in May. [place] system chemistry panel remarkable for elevated serum creatinine dating back to April 2023 with baseline around 2.1. Primary complaint is left lateral ankle tenderness and pain with weight-bearing. Requests new dressing to left hand skin tear. The patient requires inpatient medicine and rehabilitation evaluations and services for ongoing dysfunction secondary to ICH.Patient transferred to CIR on 10/12/2023 .    Patient currently requires min with basic  self-care skills secondary to muscle weakness, impaired timing and sequencing, and decreased standing balance, hemiplegia, and decreased balance strategies.  Prior to hospitalization, patient could complete BADL/IADL with  independent .  Patient will benefit from skilled intervention to decrease level of assist with basic self-care skills, increase independence with basic self-care skills, and increase level of independence with iADL prior to discharge home independently.  Anticipate patient will require intermittent supervision and no further OT follow recommended.  OT - End of Session Activity Tolerance: Decreased this session Endurance Deficit: Yes OT Assessment Rehab Potential (ACUTE ONLY): Good OT Patient demonstrates impairments in the following area(s): Balance;Pain;Edema;Endurance;Motor OT Basic ADL's Functional Problem(s): Dressing;Toileting;Bathing OT Transfers Functional Problem(s): Toilet;Tub/Shower OT Additional Impairment(s): None OT Plan OT Intensity: Minimum of 1-2 x/day, 45 to 90 minutes OT Frequency: 5 out of 7 days OT Duration/Estimated Length of Stay: 5-7 days OT Treatment/Interventions: Balance/vestibular training;Patient/family education;Self Care/advanced ADL retraining;Splinting/orthotics;Therapeutic Exercise;UE/LE Coordination activities;UE/LE Strength taining/ROM;Therapeutic Activities;Pain management;Functional mobility training;DME/adaptive equipment instruction;Discharge planning;Cognitive remediation/compensation OT Self Feeding Anticipated Outcome(s): Independent OT Basic Self-Care Anticipated Outcome(s): Modified Independent OT Toileting Anticipated Outcome(s): Modified Independent OT Bathroom Transfers Anticipated Outcome(s): Modified Independent OT Recommendation Patient destination: Home Follow Up Recommendations: None Equipment Recommended: To be determined   OT Evaluation Precautions/Restrictions  Precautions Precautions: Fall Precaution Comments: L ankle sprain Restrictions Weight Bearing Restrictions: No General OT Amount of Missed Time: 12 Minutes Pain Pain Assessment Pain Score: 3  Pain Type: Acute pain Pain Location: Ankle Pain Orientation: Left Pain  Descriptors / Indicators: Aching Patients Stated Pain Goal: 2 Pain Intervention(s): Medication (See eMAR);Splinting;Rest;Hot/Cold interventions Home Living/Prior Functioning Home Living Living Arrangements: Alone Available Help at Discharge: Family, Available 24 hours/day Type of Home: Apartment Home Access: Stairs to enter [employer] of Steps: 5 Entrance Stairs-Rails: Right, Can reach both, Left Home Layout: One level Bathroom Shower/Tub: Tub/shower unit, Curtain (grab bars) Bathroom Toilet: Standard Bathroom Accessibility: Yes Additional Comments: patient wants to d/c home alone with her new kitten  Lives With: Alone IADL History Homemaking Responsibilities: Yes Meal Prep Responsibility: Primary Laundry Responsibility: Primary Cleaning Responsibility: Primary Bill Paying/Finance Responsibility: Primary Shopping Responsibility: Primary Current License: Yes Mode of Transportation: Car Occupation: Full time employment 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[employer], bathrooms, breakrooms Leisure and Hobbies: likes to dance - disco Prior Function Level of Independence: Independent with transfers, Independent with basic ADLs, Independent with gait  Able to Take Stairs?: Yes Driving: Yes Vocation: Full time employment Vocation Requirements: Housekeeper Leisure: Hobbies-yes (Comment) (dance - music in the parks) Vision Baseline Vision/History: 1 Wears glasses (readers) Ability to See in Adequate Light: 0 Adequate Patient Visual Report: No change from baseline (2010 cataract surgery) Vision Assessment?: No apparent visual deficits;Wears glasses for reading Perception  Perception: Within Functional Limits Praxis Praxis: WFL Cognition Cognition Overall Cognitive Status: Within Functional Limits for tasks assessed Arousal/Alertness: Awake/alert Memory: Appears intact Attention: Sustained;Selective Sustained Attention: Appears intact Selective  Attention Impairment: Functional basic Awareness: Appears intact Awareness Impairment: Emergent impairment Problem Solving: Appears intact Safety/Judgment: Appears intact Brief Interview for Mental Status (BIMS) Repetition of Three Words (First Attempt): 3 Temporal Orientation: Year: Correct Temporal Orientation: Month: Accurate within 5 days Temporal Orientation: Day: Correct Recall: "Sock": Yes, no cue required Recall: "Blue": Yes, no cue required Recall: "Bed": Yes, no cue required BIMS Summary Score: 15 Sensation Sensation Light Touch: Appears Intact (reports intermittent tingling on the bottom of both feet) Hot/Cold: Appears Intact Proprioception: Appears Intact Stereognosis: Appears Intact Coordination Gross Motor Movements are Fluid and Coordinated: No  Fine Motor Movements are Fluid and Coordinated: No Coordination and Movement Description: Limited more by L ankle pain > weakness Finger Nose Finger Test: Mild undershooting left Motor  Motor Motor: Hemiplegia Motor - Skilled Clinical Observations: mild L hemi  Trunk/Postural Assessment  Cervical Assessment Cervical Assessment: Within Functional Limits Thoracic Assessment Thoracic Assessment: Within Functional Limits Lumbar Assessment Lumbar Assessment: Within Functional Limits Postural Control Postural Control: Within Functional Limits  Balance Balance Balance Assessed: Yes Static Sitting Balance Static Sitting - Balance Support: Feet supported;No upper extremity supported Static Sitting - Level of Assistance: 7: Independent Dynamic Sitting Balance Dynamic Sitting - Balance Support: Feet supported;No upper extremity supported Dynamic Sitting - Level of Assistance: 5: Stand by assistance Sitting balance - Comments: decreased sitting balance noted when challenged towards right side Static Standing Balance Static Standing - Balance Support: Bilateral upper extremity supported Static Standing - Level of Assistance:  5: Stand by assistance Dynamic Standing Balance Dynamic Standing - Balance Support: Bilateral upper extremity supported;During functional activity Dynamic Standing - Level of Assistance: 4: Min assist Extremity/Trunk Assessment RUE Assessment RUE Assessment: Within Functional Limits    Care Tool Care Tool Self Care Eating   Eating Assist Level: Set up assist    Oral Care    Oral Care Assist Level: Set up assist    Bathing   Body parts bathed by patient: Right arm;Left arm;Chest;Abdomen;Front perineal area;Buttocks;Right upper leg;Left upper leg;Right lower leg;Left lower leg;Face     Assist Level: Contact Guard/Touching assist    Upper Body Dressing(including orthotics)   What is the patient wearing?: Pull over shirt   Assist Level: Set up assist    Lower Body Dressing (excluding footwear)   What is the patient wearing?: Pants;Underwear/pull up Assist for lower body dressing: Contact Guard/Touching assist    Putting on/Taking off footwear   What is the patient wearing?: Socks Assist for footwear: Set up assist       Care Tool Toileting Toileting activity Toileting Activity did not occur (Clothing management and hygiene only): N/A (no void or bm)       Care Tool Bed Mobility Roll left and right activity   Roll left and right assist level: Independent    Sit to lying activity   Sit to lying assist level: Supervision/Verbal cueing    Lying to sitting on side of bed activity   Lying to sitting on side of bed assist level: the ability to move from lying on the back to sitting on the side of the bed with no back support.: Supervision/Verbal cueing     Care Tool Transfers Sit to stand transfer   Sit to stand assist level: Contact Guard/Touching assist    Chair/bed transfer   Chair/bed transfer assist level: Contact Guard/Touching assist     Toilet transfer Toilet transfer activity did not occur: N/A       Care Tool Cognition  Expression of Ideas and Wants  Expression of Ideas and Wants: 4. Without difficulty (complex and basic) - expresses complex messages without difficulty and with speech that is clear and easy to understand  Understanding Verbal and Non-Verbal Content Understanding Verbal and Non-Verbal Content: 4. Understands (complex and basic) - clear comprehension without cues or repetitions   Memory/Recall Ability Memory/Recall Ability : Current season;Staff names and faces;That he or she is in a hospital/hospital unit   Refer to Care Plan for Long Term Goals  SHORT TERM GOAL WEEK 1 OT Short Term Goal 1 (Week 1): STG=LTG due to LOS  Recommendations  for other services: None    Skilled Therapeutic Intervention:   Patient received resting in recliner with feet elevated, ASO and ice pack on left ankle.  Patient declined shower stating she washed earlier, and needed coaxing to put on street clothes.  Patient very agreeable once explained part of assessment was seeing how she completed functional skills.  Patient moves quickly, but was not unsafe.  ADL status as indicated below.  Patient asking to get back in bed as she  reports not sleeping well since admission, and having recent pain medication/ muscle relaxant.  Patient very clearly able to provide history, and sees to show good insight into her deficits.  Has support from daughter who lives nearby.  Left supine in bed with ankle iced, and alarm engaged.  Call bell and personal cell phone at hand.   ADL ADL Eating: Set up Where Assessed-Eating: Chair Grooming: Setup Where Assessed-Grooming: Chair Upper Body Bathing: Setup Where Assessed-Upper Body Bathing: Chair Lower Body Bathing: Contact guard Where Assessed-Lower Body Bathing: Chair Upper Body Dressing: Setup Where Assessed-Upper Body Dressing: Chair Lower Body Dressing: Contact guard Where Assessed-Lower Body Dressing: Chair Toileting: Unable to assess Toilet Transfer: Unable to assess Toilet Transfer Method: Unable to  assess Tub/Shower Transfer: Unable to assess Tub/Shower Transfer Method: Unable to assess Film/video editor: Unable to assess Film/video editor Method: Unable to assess ADL Comments: Declined shower, needing coaxing to dress Mobility  Bed Mobility Bed Mobility: Supine to Sit;Sit to Supine Supine to Sit: Supervision/Verbal cueing Sit to Supine: Supervision/Verbal cueing Transfers Sit to Stand: Contact Guard/Touching assist Stand to Sit: Contact Guard/Touching assist   Discharge Criteria: Patient will be discharged from OT if patient refuses treatment 3 consecutive times without medical reason, if treatment goals not met, if there is a change in medical status, if patient makes no progress towards goals or if patient is discharged from hospital.  The above assessment, treatment plan, treatment alternatives and goals were discussed and mutually agreed upon: by patient  Collier Salina 10/13/2023, 1:17 PM

## 2023-10-13 NOTE — Progress Notes (Addendum)
Inpatient Rehabilitation Admission Medication Review by a Pharmacist  A complete drug regimen review was completed for this patient to identify any potential clinically significant medication issues.  High Risk Drug Classes Is patient taking? Indication by Medication  Antipsychotic No   Anticoagulant Yes SubQ heparin- DVT ppx   Antibiotic No   Opioid No   Antiplatelet No   Hypoglycemics/insulin No   Vasoactive Medication Yes Hydralazine, irbesartan- HTN   Chemotherapy No   Other Yes Pantoprazole, TUMS- reflux  Senna/docusate, bisacodyl PRN, milk of magnesia PRN, fleet enema PRN- constipation  Zofran PRN- nausea  Robitussin DM PRN- cough  Maalox PRN- dyspepsia  Alprazolam PRN- anxiety  Nicotine- tobacco dependence      Type of Medication Issue Identified Description of Issue Recommendation(s)  Drug Interaction(s) (clinically significant)     Duplicate Therapy     Allergy     No Medication Administration End Date     Incorrect Dose     Additional Drug Therapy Needed     Significant med changes from prior encounter (inform family/care partners about these prior to discharge). Non- active PTA meds- Adderall, cyanocobalamin, Advair diskus PRN, Vit D, Vitamins   Resume at discharge if clinically appropriate  Other       Clinically significant medication issues were identified that warrant physician communication and completion of prescribed/recommended actions by midnight of the next day:  No  Name of provider notified for urgent issues identified:   Provider Method of Notification:   Pharmacist comments: Monitor frequency of Maalox, milk of magnesia, and fleet enema use with renal impairment due to risk of accumulation. Renal function falsely low due to patient's low bodyweight. Normalized CrCl ~34 mL/min.  Time spent performing this drug regimen review (minutes):  20   Jani Gravel, PharmD Clinical Pharmacist  10/13/2023 7:52 AM

## 2023-10-13 NOTE — Progress Notes (Signed)
PROGRESS NOTE   Subjective/Complaints: Complains of insomnia- said she took Xanax 1-1.5mg  at home, incresaed to 1mg  here C/p pain and requests percocet to be restarted  ROS: +insomnia   Objective:   No results found. Recent Labs    10/13/23 0715  WBC 6.8  HGB 11.9*  HCT 37.6  PLT 294   Recent Labs    10/13/23 0715  NA 133*  K 4.4  CL 102  CO2 21*  GLUCOSE 122*  BUN 24*  CREATININE 1.71*  CALCIUM 9.0    Intake/Output Summary (Last 24 hours) at 10/13/2023 1021 Last data filed at 10/13/2023 0759 Gross per 24 hour  Intake 597 ml  Output --  Net 597 ml        Physical Exam: Vital Signs Blood pressure (!) 145/74, pulse 85, temperature 98.6 F (37 C), temperature source Oral, resp. rate 18, height 5\' 4"  (1.626 m), weight 55 kg, SpO2 98%. Gen: no distress, normal appearing HEENT: oral mucosa pink and moist, NCAT Cardio: Reg rate Chest: normal effort, normal rate of breathing Abd: soft, non-distended Ext: no edema Psych: pleasant, normal affect Skin: intact Strength:                RUE: 5/5 SA, 5/5 EF, 5/5 EE, 5/5 WE, 5/5 FF, 5/5 FA                 LUE: 5/5 SA, 5/5 EF, 5/5 EE, 5/5 WE, 5/5 FF, 5/5 FA                 RLE: 5/5 HF, 5/5 KE, 5/5 DF, 5/5 EHL, 5/5 PF                 LLE:  5/5 HF, 5/5 KE, 5/5 DF, 5/5 EHL, 5/5 PF    Assessment/Plan: 1. Functional deficits which require 3+ hours per day of interdisciplinary therapy in a comprehensive inpatient rehab setting. Physiatrist is providing close team supervision and 24 hour management of active medical problems listed below. Physiatrist and rehab team continue to assess barriers to discharge/monitor patient progress toward functional and medical goals  Care Tool:  Bathing              Bathing assist       Upper Body Dressing/Undressing Upper body dressing        Upper body assist      Lower Body Dressing/Undressing Lower body  dressing            Lower body assist       Toileting Toileting    Toileting assist       Transfers Chair/bed transfer  Transfers assist     Chair/bed transfer assist level: Contact Guard/Touching assist     Locomotion Ambulation   Ambulation assist      Assist level: Contact Guard/Touching assist Assistive device: Walker-rolling Max distance: 66ft   Walk 10 feet activity   Assist     Assist level: Contact Guard/Touching assist Assistive device: Walker-rolling   Walk 50 feet activity   Assist    Assist level: Contact Guard/Touching assist Assistive device: Walker-rolling    Walk 150 feet activity   Assist Walk 150  feet activity did not occur: Safety/medical concerns (L ankle pain)         Walk 10 feet on uneven surface  activity   Assist Walk 10 feet on uneven surfaces activity did not occur: Safety/medical concerns         Wheelchair     Assist Is the patient using a wheelchair?: Yes Type of Wheelchair: Manual    Wheelchair assist level: Supervision/Verbal cueing Max wheelchair distance: 150'    Wheelchair 50 feet with 2 turns activity    Assist        Assist Level: Supervision/Verbal cueing   Wheelchair 150 feet activity     Assist      Assist Level: Supervision/Verbal cueing   Blood pressure (!) 145/74, pulse 85, temperature 98.6 F (37 C), temperature source Oral, resp. rate 18, height 5\' 4"  (1.626 m), weight 55 kg, SpO2 98%.  Medical Problem List and Plan: 1. Functional deficits secondary to nontraumatic ICH of th R basal ganglia, due to hypertensive emergency             -patient may shower             -ELOS/Goals: 5-7 days, Mod I PT/OT             - Initial evals today   2.  Antithrombotics: -DVT/anticoagulation:  Pharmaceutical: Heparin 5000 U Q8H - started 11/20, toelrating             -antiplatelet therapy: none   3. Pain Management: Tylenol, percocet 1 tab ordered q8H for severe pain  as needed, Robaxin   4. Mood/Behavior/Sleep: LCSW to evaluate and provide emotional support             -Xanax 0.5 mg BID prn             -antipsychotic agents: n/a   5. Neuropsych/cognition: This patient is capable of making decisions on her own behalf.              - Adderall 30 mg BID home med held   6. Skin/Wound Care: Routine skin care checks   7. Fluids/Electrolytes/Nutrition: Routine Is and Os and follow-up chemistries   8: Hypertension: monitor TID and prn             -continue hydralazine 100 mg TID             -continue irbesartan 150 mg daily   9: Tobacco use: continue Nicoderm   10: GERD: continue Protonix   11: Likely chronic kidney disease: appears to be at baseline Cr             -follow-up CBC   12: Skin tear to left hand and left elbow: wound care orders placed   13: Left ankle strain/sprain; pain over ATF with weightbearing. xrays on admission negative.              - Ice, compression, voltaren gel QID  -ASO ordered   14. Substance use (+THC)/medication noncompliance.  provide education.   15. Insomnia: Xanax increased to 1mg  home dose        LOS: 1 days A FACE TO FACE EVALUATION WAS PERFORMED  Clint Bolder P Chanz Cahall 10/13/2023, 10:21 AM

## 2023-10-13 NOTE — Progress Notes (Signed)
Physical Therapy Session Note  Patient Details  Name: Kathy Vargas MRN: [id number] Date of Birth: [birth year]  Today's Date: 10/13/2023 PT Individual Time: 4098-1191 PT Individual Time Calculation (min): 70 min   Short Term Goals: Week 1:  PT Short Term Goal 1 (Week 1): STG = LTG due to ELOS  Skilled Therapeutic Interventions/Progress Updates:    Pt lying in bed with family (sister and daughter) at the bedside. Pt reports her ASO brace has been delivered and she's wearing it. Reports improvement in pain while using the ASO brace.   Supine<>sitting EOB with supervision. Sit<>Stand and stand pivot transfer to w/c with CGA and no AD.   Transported patient to main rehab gym for time. Pt reporting a small skin tear on her L shin, unsure of how it happened. RN made aware and provided a small guaze dressing for management.   Retrieved a Knee Scooter to attempt as AD for functional mobility. Educated patient on precautions and safety features. MinA for safely getting on the Knee Scooter and MinA for balance as she uses the Knee Scooter ~85ft. Patient with weak L hip resulting in genu valgus of her hip as she rides the scooter, insufficient hip strength to keep hip stable as she rides the scooter. She also is fearful of falling while mobilizing with the Knee Scooter. Pt defer's using the scooter and prefers the RW.   Assisted patient into long sitting on the mat table to work on ankle exercises for her LLE.  -2x25 ankle pumps -Ankle ABC's -2x25 ankle PF with yellow TB resistance -2x25 ankle DF with yellow TB resistance -2x25 ankle INV with yellow TB resistance -2x25 ankle EV with no resistance -passive ankle DF in pain free range of motion  Single leg stance on LLE with B HHA and minA to work on ankle stability and weight bearing through LLE. Pt having trouble keeping LLE from hyperextending, when she keeps knee slightly flexed it wants to buckle.   Pt reporting fatigue and requesting to  lie back down in bed. Stand pivot transfer back to her w/c with CGA and no AD. Transported to her room and returned to bed in similar manner. Bed mobility completed without assist. Provided with fresh ice pack and applied to lateral side of her L ankle. All needs met at end.   Therapy Documentation Precautions:  Precautions Precautions: Fall Precaution Comments: L ankle sprain Restrictions Weight Bearing Restrictions: No General:    Therapy/Group: Individual Therapy  Shailen Thielen P Vicci Reder  PT, DPT, CSRS  10/13/2023, 12:10 PM

## 2023-10-13 NOTE — Discharge Instructions (Addendum)
Inpatient Rehab Discharge Instructions  Kathy Vargas Discharge date and time:  10/21/2023  Activities/Precautions/ Functional Status: Activity: no lifting, driving, or strenuous exercise until cleared by MD Diet: cardiac diet Wound Care: keep wound clean and dry Functional status:  ___ No restrictions     ___ Walk up steps independently _x__ 24/7 supervision/assistance   ___ Walk up steps with assistance ___ Intermittent supervision/assistance  ___ Bathe/dress independently ___ Walk with walker     ___ Bathe/dress with assistance ___ Walk Independently    ___ Shower independently ___ Walk with assistance    _x__ Shower with assistance _x__ No alcohol     ___ Return to work/school ________  Special Instructions: No driving, alcohol consumption or tobacco use.  Recommend not resuming Adderall until follow-up with neurologist.  Follow-up with PCP regarding monitoring of kidney function.   COMMUNITY REFERRALS UPON DISCHARGE:    Home Health:   PT  &  OT                 Agency:[facility] Phone:[phone]   Medical Equipment/Items Ordered:ROLLING WALKER AND 3 IN 1                                                 Agency/Supplier:[facility]  [phone]   STROKE/TIA DISCHARGE INSTRUCTIONS SMOKING Cigarette smoking nearly doubles your risk of having a stroke & is the single most alterable risk factor  If you smoke or have smoked in the last 12 months, you are advised to quit smoking for your health. Most of the excess cardiovascular risk related to smoking disappears within a year of stopping. Ask you doctor about anti-smoking medications [place] Quit Line: 1-800-QUIT NOW Free Smoking Cessation Classes [phone]  CHOLESTEROL Know your levels; limit fat & cholesterol in your diet  Lipid Panel     Component Value Date/Time   CHOL 225 (H) 04/21/2023 1434   TRIG 126.0 04/21/2023 1434   HDL 75.80 04/21/2023 1434   CHOLHDL 3 04/21/2023 1434   VLDL 25.2 04/21/2023 1434    LDLCALC 124 (H) 04/21/2023 1434     Many patients benefit from treatment even if their cholesterol is at goal. Goal: Total Cholesterol (CHOL) less than 160 Goal:  Triglycerides (TRIG) less than 150 Goal:  HDL greater than 40 Goal:  LDL (LDLCALC) less than 100   BLOOD PRESSURE American Stroke Association blood pressure target is less that 120/80 mm/Hg  Your discharge blood pressure is:  BP: 139/70 Monitor your blood pressure Limit your salt and alcohol intake Many individuals will require more than one medication for high blood pressure  DIABETES (A1c is a blood sugar average for last 3 months) Goal HGBA1c is under 7% (HBGA1c is blood sugar average for last 3 months)  Diabetes: No known diagnosis of diabetes    Lab Results  Component Value Date   HGBA1C 6.0 04/21/2023    Your HGBA1c can be lowered with medications, healthy diet, and exercise. Check your blood sugar as directed by your physician Call your physician if you experience unexplained or low blood sugars.  PHYSICAL ACTIVITY/REHABILITATION Goal is 30 minutes at least 4 days per week  Activity: Increase activity slowly, Therapies: Physical Therapy: Outpatient and Occupational Therapy: Outpatient Return to work: when cleared by MD Activity decreases your risk of heart attack and stroke and makes your heart stronger.  It helps control your weight and blood pressure; helps you relax and can improve your mood. Participate in a regular exercise program. Talk with your doctor about the best form of exercise for you (dancing, walking, swimming, cycling).  DIET/WEIGHT Goal is to maintain a healthy weight  Your discharge diet is:  Diet Order             Diet Heart Room service appropriate? Yes; Fluid consistency: Thin  Diet effective now                  thin liquids Your height is:  Height: 5\' 4"  (162.6 cm) Your current weight is: Weight: 55 kg Your Body Mass Index (BMI) is:  BMI (Calculated): 20.8 Following the type of  diet specifically designed for you will help prevent another stroke. Your goal weight range is:   Your goal Body Mass Index (BMI) is 19-24. Healthy food habits can help reduce 3 risk factors for stroke:  High cholesterol, hypertension, and excess weight.  RESOURCES Stroke/Support Group:  Call [phone]   STROKE EDUCATION PROVIDED/REVIEWED AND GIVEN TO PATIENT Stroke warning signs and symptoms How to activate emergency medical system (call 911). Medications prescribed at discharge. Need for follow-up after discharge. Personal risk factors for stroke. Pneumonia vaccine given: No Flu vaccine given: No My questions have been answered, the writing is legible, and I understand these instructions.  I will adhere to these goals & educational materials that have been provided to me after my discharge from the hospital.     My questions have been answered and I understand these instructions. I will adhere to these goals and the provided educational materials after my discharge from the hospital.  Patient/Caregiver Signature _______________________________ Date __________  Clinician Signature _______________________________________ Date __________  Please bring this form and your medication list with you to all your follow-up doctor's appointments.

## 2023-10-13 NOTE — Progress Notes (Signed)
Inpatient Rehabilitation Care Coordinator Assessment and Plan Patient Details  Name: Kathy Vargas MRN: [id number] Date of Birth: 1951-06-21  Today's Date: 10/13/2023  Hospital Problems: Principal Problem:   ICH (intracerebral hemorrhage) [facility])  Past Medical History:  Past Medical History:  Diagnosis Date   ABDOMINAL TENDERNESS 01/21/2011   ACUTE GINGIVITIS NONPLAQUE INDUCED 06/11/2010   ADD 09/05/2008   Alcohol abuse, in remission 09/27/2007   ALLERGIC RHINITIS 03/02/2009   Anemia    ANXIETY 07/02/2007   BACK PAIN 05/19/2009   CHRONIC OBSTRUCTIVE PULMONARY DISEASE, ACUTE EXACERBATION 08/06/2009   COPD 07/02/2007   DEGENERATIVE JOINT DISEASE, CERVICAL SPINE 07/02/2007   DEPRESSION 07/02/2007   Dyspnea    GASTROENTERITIS, VIRAL 11/29/2010   GERD 01/21/2011   Headache(784.0) 07/02/2007   History of kidney stones    Hypertension    HYPOTHYROIDISM 07/02/2007   Lumbar disc disease    Lumbar pain with radiation down right leg 06/01/2011   OSTEOARTHRITIS 07/02/2007   Other and unspecified ovarian cyst 05/19/2009   PAIN, CHRONIC, DUE TO TRAUMA 07/02/2007   PELVIC  PAIN 03/29/2010   UTI 05/19/2009   Vitamin D insufficiency    Past Surgical History:  Past Surgical History:  Procedure Laterality Date   ABDOMINAL HYSTERECTOMY     CATARACT EXTRACTION W/ INTRAOCULAR LENS IMPLANT Bilateral    CHOLECYSTECTOMY     CYSTOSCOPY/URETEROSCOPY/HOLMIUM LASER/STENT PLACEMENT Left 07/01/2022   Procedure: CYSTOSCOPY/ RETROGRADE/URETEROSCOPY/STENT PLACEMENT;  Surgeon: Jannifer Hick, MD;  Location: [facility];  Service: Urology;  Laterality: Left;   eyebrow lift     IR URETERAL STENT LEFT NEW ACCESS W/O SEP NEPHROSTOMY CATH  07/01/2022   LIPOSUCTION     LUMBAR DISC SURGERY     L4, L5   NEPHROLITHOTOMY Left 07/01/2022   Procedure: NEPHROLITHOTOMY PERCUTANEOUS/ INTERVENTIONAL RADIOLOGY TO PLACE LEFT NEPHROSTOMY TUBE PRIOR;  Surgeon: Jannifer Hick, MD;  Location: [facility];  Service: Urology;   Laterality: Left;  ONLY NEEDS 120 MIN FOR ALL PROCEDURES   URETEROSCOPY WITH HOLMIUM LASER LITHOTRIPSY Right 04/12/2022   Procedure: URETEROSCOPY WITH HOLMIUM LASER LITHOTRIPSY, STENT PLACEMENT;  Surgeon: Despina Arias, MD;  Location: [facility];  Service: Urology;  Laterality: Right;   Social History:  reports that she has been smoking cigarettes. She started smoking about 60 years ago. She has a 30.4 pack-year smoking history. She has never used smokeless tobacco. She reports that she does not currently use alcohol. She reports that she does not use drugs.  Family / Support Systems Marital Status: Divorced Patient Roles: Parent, Other (Comment) (sibling/employee) Children: melanie-daughter [phone] Other Supports: Debbie-sister [phone] Anticipated Caregiver: If needed daughter  and son in-law Ability/Limitations of Caregiver: Daughter works second shift and son in-law wroks first. Pt wants to go to her home with her new kitten Caregiver Availability: Other (Comment) (Between both can provide almost 24/7) Family Dynamics: Close with family and friends, pt is very independent and wants to get back to her apartment and new kitten. Not one to ask for assist and will compensate for her deficits. 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   Abuse/Neglect Abuse/Neglect Assessment Can Be Completed: Yes Physical Abuse: Denies Verbal Abuse: Denies Sexual  Abuse: Denies Exploitation of patient/patient's resources: Denies Self-Neglect: Denies  Patient response to: Social Isolation - How often do you feel lonely or isolated from those around you?: Never  Emotional Status Pt's affect, behavior and adjustment status: Pt has alwyas been independent and hopes to be again. She is not one to ask others or depend upon others for assist. She feels she can compensate her main issue is her ankle and the pain she is having with this. Recent Psychosocial Issues: other health issues but was managing Psychiatric History: history depression/anxiety takes medications for this. Would benefit from seeing neuro-psych while here. Will place on list Substance Abuse History: Tobacco aware of the health risks and will try to quit. Has a history of ETOH but is not using  Patient / Family Perceptions, Expectations & Goals Pt/Family understanding of illness & functional limitations: Pt is able to explain her stroke and deficits, she seems to have good movement but is limited by her ankle pain. She does talk with the MD and feels has a good understanding of her plan moving forward. She hopes she is not here long. Premorbid pt/family roles/activities: Mom, sister, employee, friend, etc Anticipated changes in roles/activities/participation: resume Pt/family expectations/goals: Pt states: " I hope to do well and not be here long and back home with my kitten."  [employer]: None Premorbid Home Care/DME Agencies: None Transportation available at discharge: self will need to rely upon family until she can drive again Is the patient able to respond to transportation needs?: Yes In the past 12 months, has lack of transportation kept you from medical appointments or from getting medications?: No In the past 12 months, has lack of transportation kept you from meetings, work, or from getting things needed for daily living?: No Resource referrals  recommended: Neuropsychology  Discharge Planning Living Arrangements: Alone Support Systems: Children, Other relatives, Friends/neighbors Type of Residence: Private residence Insurance Resources: Media planner (specify) Investment banker, operational) Financial Resources: Employment, Restaurant manager, fast food Screen Referred: Yes Living Expenses: Rent Money Management: Patient Does the patient have any problems obtaining your medications?: No Home Management: self Patient/Family Preliminary Plans: Hopes to return back to her apartment and be able to take care of herself, if not will go to daughters home in [place] where she will have almost 24/7 supervision between daughter and son in-law. Care Coordinator Barriers to Discharge: Insurance for SNF coverage Care Coordinator Anticipated Follow Up Needs: HH/OP  Clinical Impression Pleasant female who is motivated to get back home to her new kitten. Discharge plan depends upon her level if mod/I will go to her home if not will go to daughter's home. Await therapy team's goals and work on discharge needs. Have placed on neuro-psych list due to anxiety and depression issues.  Lucy Chris 10/13/2023, 9:15 AM

## 2023-10-14 DIAGNOSIS — I610 Nontraumatic intracerebral hemorrhage in hemisphere, subcortical: Secondary | ICD-10-CM | POA: Diagnosis not present

## 2023-10-14 MED ORDER — POLYETHYLENE GLYCOL 3350 17 G PO PACK
17.0000 g | PACK | Freq: Every day | ORAL | Status: DC
Start: 2023-10-14 — End: 2023-10-14
  Administered 2023-10-14: 17 g via ORAL
  Filled 2023-10-14: qty 1

## 2023-10-14 MED ORDER — SENNOSIDES-DOCUSATE SODIUM 8.6-50 MG PO TABS
2.0000 | ORAL_TABLET | Freq: Two times a day (BID) | ORAL | Status: DC
  Administered 2023-10-14 – 2023-10-16 (×4): 2 via ORAL
  Filled 2023-10-14 (×4): qty 2

## 2023-10-14 MED ORDER — AMLODIPINE BESYLATE 5 MG PO TABS
5.0000 mg | ORAL_TABLET | Freq: Every day | ORAL | Status: DC
  Administered 2023-10-14 – 2023-10-20 (×7): 5 mg via ORAL
  Filled 2023-10-14 (×7): qty 1

## 2023-10-14 NOTE — Plan of Care (Signed)
  Problem: Consults Goal: RH STROKE PATIENT EDUCATION Description: See Patient Education module for education specifics  Outcome: Progressing   Problem: RH BOWEL ELIMINATION Goal: RH STG MANAGE BOWEL WITH ASSISTANCE Description: STG Manage Bowel with toileting Assistance. Outcome: Progressing Goal: RH STG MANAGE BOWEL W/MEDICATION W/ASSISTANCE Description: STG Manage Bowel with Medication with mod I Assistance. Outcome: Progressing   Problem: RH SAFETY Goal: RH STG ADHERE TO SAFETY PRECAUTIONS W/ASSISTANCE/DEVICE Description: STG Adhere to Safety Precautions With cues Assistance/Device. Outcome: Progressing   Problem: RH PAIN MANAGEMENT Goal: RH STG PAIN MANAGED AT OR BELOW PT'S PAIN GOAL Description: < 4 w prns Outcome: Progressing   Problem: RH KNOWLEDGE DEFICIT Goal: RH STG INCREASE KNOWLEDGE OF DIABETES Description: Patient will be able to manage prediabetes with dietary modification using educational resources independently Outcome: Progressing Goal: RH STG INCREASE KNOWLEDGE OF HYPERTENSION Description: Patient will be able to manage HTN with medications and dietary modification using educational resources independently Outcome: Progressing Goal: RH STG INCREASE KNOWLEGDE OF HYPERLIPIDEMIA Description: Patient will be able to manage HLD  with medications and dietary modification using educational resources independently Outcome: Progressing Goal: RH STG INCREASE KNOWLEDGE OF STROKE PROPHYLAXIS Description: Patient will be able to manage secondary risks with medications and dietary modification using educational resources independently Outcome: Progressing

## 2023-10-14 NOTE — Progress Notes (Addendum)
PROGRESS NOTE   Subjective/Complaints:  Vital stable, some moderate hypertension overnight. Patient complains of ongoing difficulty sleeping, although states much improved with increase in Xanax from yesterday. Discussed constipation and type I stool on 11/21, patient is disgusted at the thought of acting MiraLAX, agreeable to increasing Senokot. No other concerns or complaints  ROS: +insomnia, constipation.  Denies fevers, chills, N/V, abdominal pain, SOB, chest pain, new weakness or paraesthesias.     Objective:   No results found. Recent Labs    10/13/23 0715  WBC 6.8  HGB 11.9*  HCT 37.6  PLT 294   Recent Labs    10/13/23 0715  NA 133*  K 4.4  CL 102  CO2 21*  GLUCOSE 122*  BUN 24*  CREATININE 1.71*  CALCIUM 9.0    Intake/Output Summary (Last 24 hours) at 10/14/2023 1506 Last data filed at 10/14/2023 1345 Gross per 24 hour  Intake 477 ml  Output --  Net 477 ml        Physical Exam: Vital Signs Blood pressure 134/70, pulse 75, temperature 97.6 F (36.4 C), temperature source Oral, resp. rate 16, height 5\' 4"  (1.626 m), weight 55 kg, SpO2 100%. Gen: no distress, normal appearing.  Sitting up in bedside chair. HEENT: oral mucosa pink and moist, NCAT Cardio: Reg rate and rhythm, no murmurs or gallops. Chest: normal effort, normal rate of breathing Abd: soft, non-distended.  Positive bowel sounds. Ext: no edema Psych: pleasant, normal affect Skin: intact, mild bruising on chest and bilateral arms. Strength: Antigravity against resistance in all 4 extremities                RUE: 5/5 SA, 5/5 EF, 5/5 EE, 5/5 WE, 5/5 FF, 5/5 FA                 LUE: 5/5 SA, 5/5 EF, 5/5 EE, 5/5 WE, 5/5 FF, 5/5 FA                 RLE: 5/5 HF, 5/5 KE, 5/5 DF, 5/5 EHL, 5/5 PF                 LLE:  5/5 HF, 5/5 KE, 5/5 DF, 5/5 EHL, 5/5 PF    Assessment/Plan: 1. Functional deficits which require 3+ hours per day of  interdisciplinary therapy in a comprehensive inpatient rehab setting. Physiatrist is providing close team supervision and 24 hour management of active medical problems listed below. Physiatrist and rehab team continue to assess barriers to discharge/monitor patient progress toward functional and medical goals  Care Tool:  Bathing    Body parts bathed by patient: Right arm, Left arm, Chest, Abdomen, Front perineal area, Buttocks, Right upper leg, Left upper leg, Right lower leg, Left lower leg, Face         Bathing assist Assist Level: Contact Guard/Touching assist     Upper Body Dressing/Undressing Upper body dressing   What is the patient wearing?: Pull over shirt    Upper body assist Assist Level: Set up assist    Lower Body Dressing/Undressing Lower body dressing      What is the patient wearing?: Pants, Underwear/pull up     Lower  body assist Assist for lower body dressing: Contact Guard/Touching assist     Toileting Toileting Toileting Activity did not occur (Clothing management and hygiene only): N/A (no void or bm)  Toileting assist       Transfers Chair/bed transfer  Transfers assist     Chair/bed transfer assist level: Contact Guard/Touching assist     Locomotion Ambulation   Ambulation assist      Assist level: Contact Guard/Touching assist Assistive device: Walker-rolling Max distance: 32ft   Walk 10 feet activity   Assist     Assist level: Contact Guard/Touching assist Assistive device: Walker-rolling   Walk 50 feet activity   Assist    Assist level: Contact Guard/Touching assist Assistive device: Walker-rolling    Walk 150 feet activity   Assist Walk 150 feet activity did not occur: Safety/medical concerns (L ankle pain)         Walk 10 feet on uneven surface  activity   Assist Walk 10 feet on uneven surfaces activity did not occur: Safety/medical concerns         Wheelchair     Assist Is the patient  using a wheelchair?: Yes Type of Wheelchair: Manual    Wheelchair assist level: Supervision/Verbal cueing Max wheelchair distance: 150'    Wheelchair 50 feet with 2 turns activity    Assist        Assist Level: Supervision/Verbal cueing   Wheelchair 150 feet activity     Assist      Assist Level: Supervision/Verbal cueing   Blood pressure 134/70, pulse 75, temperature 97.6 F (36.4 C), temperature source Oral, resp. rate 16, height 5\' 4"  (1.626 m), weight 55 kg, SpO2 100%.  Medical Problem List and Plan: 1. Functional deficits secondary to nontraumatic ICH of th R basal ganglia, due to hypertensive emergency             -patient may shower             -ELOS/Goals: 5-7 days, Mod I PT/OT             -Stable to continue CIR   2.  Antithrombotics: -DVT/anticoagulation:  Pharmaceutical: Heparin 5000 U Q8H - started 11/20, toelrating             -antiplatelet therapy: none   3. Pain Management: Tylenol, percocet 1 tab ordered q8H for severe pain as needed, Robaxin   4. Mood/Behavior/Sleep: LCSW to evaluate and provide emotional support             -Xanax 0.5 mg BID prn-increased to 1 mg yesterday, with improvement in sleep             -antipsychotic agents: n/a  11-23: Add 5 mg melatonin nightly   5. Neuropsych/cognition: This patient is capable of making decisions on her own behalf.              - Adderall 30 mg BID home med held   6. Skin/Wound Care: Routine skin care checks   7. Fluids/Electrolytes/Nutrition: Routine Is and Os and follow-up chemistries   8: Hypertension: monitor TID and prn             -continue hydralazine 100 mg TID             -continue irbesartan 150 mg daily  -11-23: Consistently in the high 140s, add Norvasc 5 mg today     10/14/2023    1:42 PM 10/14/2023    9:22 AM 10/14/2023    6:08 AM  Vitals with  BMI  Systolic 134 146 253  Diastolic 70 70 83  Pulse 75  81      9: Tobacco use: continue Nicoderm   10: GERD: continue  Protonix   11: Likely chronic kidney disease: appears to be at baseline Cr             -follow-up CBC   12: Skin tear to left hand and left elbow: wound care orders placed   13: Left ankle strain/sprain; pain over ATF with weightbearing. xrays on admission negative.              - Ice, compression, voltaren gel QID  -ASO ordered   14. Substance use (+THC)/medication noncompliance.  provide education.   15. Insomnia: Xanax increased to 1mg  home dose -Adding melatonin as above  16.  Constipation.  Patient does not want MiraLAX scheduled.  Increase Senokot to 2 tabs twice daily.      LOS: 2 days A FACE TO FACE EVALUATION WAS PERFORMED  Kathy Vargas 10/14/2023, 3:06 PM

## 2023-10-15 DIAGNOSIS — I69328 Other speech and language deficits following cerebral infarction: Secondary | ICD-10-CM | POA: Diagnosis not present

## 2023-10-15 DIAGNOSIS — I69154 Hemiplegia and hemiparesis following nontraumatic intracerebral hemorrhage affecting left non-dominant side: Secondary | ICD-10-CM | POA: Diagnosis not present

## 2023-10-15 DIAGNOSIS — I610 Nontraumatic intracerebral hemorrhage in hemisphere, subcortical: Secondary | ICD-10-CM | POA: Diagnosis not present

## 2023-10-15 DIAGNOSIS — N179 Acute kidney failure, unspecified: Secondary | ICD-10-CM | POA: Diagnosis not present

## 2023-10-15 DIAGNOSIS — I161 Hypertensive emergency: Secondary | ICD-10-CM | POA: Diagnosis not present

## 2023-10-15 MED ORDER — ALPRAZOLAM 0.5 MG PO TABS
0.5000 mg | ORAL_TABLET | Freq: Two times a day (BID) | ORAL | Status: DC | PRN
  Administered 2023-10-15 – 2023-10-19 (×4): 1 mg via ORAL
  Filled 2023-10-15: qty 2
  Filled 2023-10-15: qty 1
  Filled 2023-10-15 (×3): qty 2

## 2023-10-15 MED ORDER — IPRATROPIUM-ALBUTEROL 0.5-2.5 (3) MG/3ML IN SOLN
3.0000 mL | RESPIRATORY_TRACT | Status: DC | PRN
  Filled 2023-10-15: qty 3

## 2023-10-15 MED ORDER — DICLOFENAC SODIUM 1 % EX GEL
2.0000 g | Freq: Four times a day (QID) | CUTANEOUS | Status: DC
  Administered 2023-10-15 – 2023-10-20 (×21): 2 g via TOPICAL
  Filled 2023-10-15: qty 100

## 2023-10-15 MED ORDER — LORATADINE 10 MG PO TABS
10.0000 mg | ORAL_TABLET | Freq: Every day | ORAL | Status: DC
  Administered 2023-10-15 – 2023-10-20 (×6): 10 mg via ORAL
  Filled 2023-10-15 (×6): qty 1

## 2023-10-15 NOTE — Progress Notes (Signed)
PROGRESS NOTE   Subjective/Complaints:  This a.m., complaining of some chest tightness.  EKG with T wave inversions which are consistent with prior studies, no apparent changes.  On exam, patient denies any chest pain, has some mild shortness of breath.  Is reluctant to take Xanax for these symptoms as recommended by nursing.  She does endorse a 50+ pack year history of tobacco use, has albuterol inhalers at home but does not use them because they "make my breathing worse"  Vital stable, 8/10 L ankle pain overnight.  Patient also endorses ongoing pain in her left hip, which she thinks she strained during her fall.  She endorses some chronic muscle aching and tightness in the left hip as well.  Finally, asking for resumption of Ritalin 30 mg twice daily per home regimen.  Discussed risks of tachycardia and hypertension that accompany this, along with recent stroke, declined to resume at this time and defer to primary team.  She is understanding and agreeable.  LBM 11/21.   ROS: +insomnia-improved, left ankle and hip pain-ongoing, chest tightness/shortness of breath-no.  Denies fevers, chills, N/V, abdominal pain, new weakness or paraesthesias.     Objective:   No results found. Recent Labs    10/13/23 0715  WBC 6.8  HGB 11.9*  HCT 37.6  PLT 294   Recent Labs    10/13/23 0715  NA 133*  K 4.4  CL 102  CO2 21*  GLUCOSE 122*  BUN 24*  CREATININE 1.71*  CALCIUM 9.0    Intake/Output Summary (Last 24 hours) at 10/15/2023 0843 Last data filed at 10/15/2023 0723 Gross per 24 hour  Intake 720 ml  Output --  Net 720 ml        Physical Exam: Vital Signs Blood pressure (!) 108/91, pulse 85, temperature 98.3 F (36.8 C), temperature source Oral, resp. rate 18, height 5\' 4"  (1.626 m), weight 55 kg, SpO2 98%. Gen: no distress, normal appearing.  Sitting up in bed. HEENT: oral mucosa pink and moist, NCAT Cardio: Reg rate  and rhythm, no murmurs or gallops. Chest: normal effort, normal rate of breathing.  Mild expiratory wheezes bilaterally.  No rales, rhonchi. Abd: soft, non-distended.  Positive bowel sounds. Ext: no edema Psych: pleasant, normal affect.  Mildly anxious. Skin: intact, mild bruising on chest and bilateral arms. Strength: Antigravity against resistance in all 4 extremities                RUE: 5/5 SA, 5/5 EF, 5/5 EE, 5/5 WE, 5/5 FF, 5/5 FA                 LUE: 5/5 SA, 5/5 EF, 5/5 EE, 5/5 WE, 5/5 FF, 5/5 FA                 RLE: 5/5 HF, 5/5 KE, 5/5 DF, 5/5 EHL, 5/5 PF                 LLE:  5/5 HF, 5/5 KE, 5/5 DF, 5/5 EHL, 5/5 PF  MSK: Positive left groin pain with FABER and FADIR year, radiating into medial thigh.  Negative SI joint maneuvers.  Moderate hamstring tightness.  TTP over left  GTB.  Full active range of motion.  Assessment/Plan: 1. Functional deficits which require 3+ hours per day of interdisciplinary therapy in a comprehensive inpatient rehab setting. Physiatrist is providing close team supervision and 24 hour management of active medical problems listed below. Physiatrist and rehab team continue to assess barriers to discharge/monitor patient progress toward functional and medical goals  Care Tool:  Bathing    Body parts bathed by patient: Right arm, Left arm, Chest, Abdomen, Front perineal area, Buttocks, Right upper leg, Left upper leg, Right lower leg, Left lower leg, Face         Bathing assist Assist Level: Contact Guard/Touching assist     Upper Body Dressing/Undressing Upper body dressing   What is the patient wearing?: Pull over shirt    Upper body assist Assist Level: Set up assist    Lower Body Dressing/Undressing Lower body dressing      What is the patient wearing?: Pants, Underwear/pull up     Lower body assist Assist for lower body dressing: Contact Guard/Touching assist     Toileting Toileting Toileting Activity did not occur (Clothing  management and hygiene only): N/A (no void or bm)  Toileting assist       Transfers Chair/bed transfer  Transfers assist     Chair/bed transfer assist level: Contact Guard/Touching assist     Locomotion Ambulation   Ambulation assist      Assist level: Contact Guard/Touching assist Assistive device: Walker-rolling Max distance: 33ft   Walk 10 feet activity   Assist     Assist level: Contact Guard/Touching assist Assistive device: Walker-rolling   Walk 50 feet activity   Assist    Assist level: Contact Guard/Touching assist Assistive device: Walker-rolling    Walk 150 feet activity   Assist Walk 150 feet activity did not occur: Safety/medical concerns (L ankle pain)         Walk 10 feet on uneven surface  activity   Assist Walk 10 feet on uneven surfaces activity did not occur: Safety/medical concerns         Wheelchair     Assist Is the patient using a wheelchair?: Yes Type of Wheelchair: Manual    Wheelchair assist level: Supervision/Verbal cueing Max wheelchair distance: 150'    Wheelchair 50 feet with 2 turns activity    Assist        Assist Level: Supervision/Verbal cueing   Wheelchair 150 feet activity     Assist      Assist Level: Supervision/Verbal cueing   Blood pressure (!) 108/91, pulse 85, temperature 98.3 F (36.8 C), temperature source Oral, resp. rate 18, height 5\' 4"  (1.626 m), weight 55 kg, SpO2 98%.  Medical Problem List and Plan: 1. Functional deficits secondary to nontraumatic ICH of th R basal ganglia, due to hypertensive emergency             -patient may shower             -ELOS/Goals: 5-7 days, Mod I PT/OT             -Stable to continue CIR   2.  Antithrombotics: -DVT/anticoagulation:  Pharmaceutical: Heparin 5000 U Q8H - started 11/20, toelrating             -antiplatelet therapy: none   3. Pain Management: Tylenol, percocet 1 tab ordered q8H for severe pain as needed, Robaxin   4.  Mood/Behavior/Sleep: LCSW to evaluate and provide emotional support             -  Xanax 0.5 mg BID prn-increased to 1 mg yesterday, with improvement in sleep   -11-24: Change Xanax to 0.5 to 1 mg twice daily as needed, so patient can adjust to lower dose and avoid oversedation as desired              -antipsychotic agents: n/a  11-23: Add 5 mg melatonin nightly   5. Neuropsych/cognition: This patient is capable of making decisions on her own behalf.              - Adderall 30 mg BID home med held -declined to resume 11-24 due to hypertensive causes of stroke, defer to primary team   6. Skin/Wound Care: Routine skin care checks   7. Fluids/Electrolytes/Nutrition: Routine Is and Os and follow-up chemistries   8: Hypertension: monitor TID and prn             -continue hydralazine 100 mg TID             -continue irbesartan 150 mg daily  -11-23: Consistently in the high 140s, add Norvasc 5 mg today  11-24: Significantly improved today, monitor     10/15/2023    7:24 AM 10/15/2023    5:18 AM 10/14/2023    7:33 PM  Vitals with BMI  Systolic 108 132 981  Diastolic 91 76 59  Pulse  85 89      9: Tobacco use/COPD: continue Nicoderm  -11-24: Add Claritin 10 mg daily (takes this or Robitussin at home almost daily ) and DuoNebs every 6 hours as needed for shortness of breath and wheezing.   -- Has had questionable anxiety related to albuterol in the past; encouraged her to use Xanax with this if needed.   10: GERD: continue Protonix   11: Likely chronic kidney disease: appears to be at baseline Cr             -follow-up CBC   12: Skin tear to left hand and left elbow: wound care orders placed   13: Left ankle strain/sprain; pain over ATF with weightbearing. xrays on admission negative.              - Ice, compression, voltaren gel QID  -ASO ordered   14. Substance use (+THC)/medication noncompliance.  provide education.   15. Insomnia: Xanax increased to 1mg  home dose -Adding  melatonin as above  16.  Constipation.  Patient does not want MiraLAX scheduled.  Increase Senokot to 2 tabs twice daily.   17.  Left hip pain.  Exam maneuvers suspicious for osteoarthritis, patient declines x-ray.    LOS: 3 days A FACE TO FACE EVALUATION WAS PERFORMED  Kathy Vargas 10/15/2023, 8:43 AM

## 2023-10-15 NOTE — Progress Notes (Signed)
Occupational Therapy Note  Patient Details  Name: Kathy Vargas MRN: [id number] Date of Birth: Apr 08, 1951  Today's Date: 10/15/2023 OT Missed Time: 60 Minutes Missed Time Reason: Patient fatigue;Patient ill (comment)  Pt continues to report feeling bad and so tired. Her desire was to rest at this time missing 60 min of skilled OT . RN aware.  Roney Mans [facility] 10/15/2023, 1:36 PM

## 2023-10-15 NOTE — IPOC Note (Signed)
Overall Plan of Care [facility]) Patient Details Name: Kathy Vargas MRN: [id number] DOB: Nov 25, 1950  Admitting Diagnosis: ICH (intracerebral hemorrhage) [facility])  Hospital Problems: Principal Problem:   ICH (intracerebral hemorrhage) (HCC)     Functional Problem List: Nursing Pain, Safety, Endurance, Medication Management, Bowel  PT Balance, Endurance, Edema, Motor  OT Balance, Pain, Edema, Endurance, Motor  SLP    TR         Basic ADL's: OT Dressing, Toileting, Bathing     Advanced  ADL's: OT       Transfers: PT Bed Mobility, Bed to Chair, Car  OT Toilet, Tub/Shower     Locomotion: PT Ambulation, Stairs     Additional Impairments: OT None  SLP        TR      Anticipated Outcomes Item Anticipated Outcome  Self Feeding Independent  Swallowing      Basic self-care  Modified Independent  Toileting  Modified Independent   Bathroom Transfers Modified Independent  Bowel/Bladder  manage bowel w mod I assist  Transfers  mod I  Locomotion  mod I  Communication     Cognition     Pain  < 4 with prns  Safety/Judgment  manage w cues   Therapy Plan: PT Intensity: Minimum of 1-2 x/day ,45 to 90 minutes PT Frequency: 5 out of 7 days PT Duration Estimated Length of Stay: 5-7 days OT Intensity: Minimum of 1-2 x/day, 45 to 90 minutes OT Frequency: 5 out of 7 days OT Duration/Estimated Length of Stay: 5-7 days     Team Interventions: Nursing Interventions Patient/Family Education, Disease Management/Prevention, Discharge Planning, Pain Management, Bowel Management, Medication Management  PT interventions Ambulation/gait training, Cognitive remediation/compensation, Discharge planning, DME/adaptive equipment instruction, Functional mobility training, Pain management, Psychosocial support, Splinting/orthotics, Therapeutic Activities, UE/LE Strength taining/ROM, Wheelchair propulsion/positioning, Therapeutic Exercise, Stair training, UE/LE Coordination activities,  Skin care/wound management, Patient/family education, Neuromuscular re-education, Functional electrical stimulation, Disease management/prevention, Firefighter, Warden/ranger  OT Interventions Warden/ranger, Patient/family education, Self Care/advanced ADL retraining, Splinting/orthotics, Therapeutic Exercise, UE/LE Coordination activities, UE/LE Strength taining/ROM, Therapeutic Activities, Pain management, Functional mobility training, DME/adaptive equipment instruction, Discharge planning, Cognitive remediation/compensation  SLP Interventions    TR Interventions    SW/CM Interventions Discharge Planning, Psychosocial Support, Patient/Family Education   Barriers to Discharge MD  Medical stability, Home enviroment access/loayout, Wound care, Lack of/limited family support, Insurance for SNF coverage, Medication compliance, and Behavior  Nursing Home environment access/layout, Lack of/limited family support 1 level 5 ste bil rails solo; 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, Community education officer for SNF coverage, Other (comments) L ankle pain  OT      SLP      SW Insurance for SNF coverage     Team Discharge Planning: Destination: PT-Home ,OT- Home , SLP-  Projected Follow-up: PT-Outpatient PT, OT-  None, SLP-  Projected Equipment Needs: PT-To be determined, OT- To be determined, SLP-  Equipment Details: PT- , OT-  Patient/family involved in discharge planning: PT- Patient,  OT-Patient, SLP-   MD ELOS: 5-7 days Medical Rehab Prognosis:  Excellent Assessment: The patient has been admitted for CIR therapies with the diagnosis of nontraumatic ICH due to hypertension. The team will be addressing functional mobility, strength, stamina, balance, safety, adaptive techniques and equipment, self-care, bowel and bladder mgt, patient and caregiver education,. Goals have been set at Mod I. Anticipated discharge destination is  home with daughter.       See Team Conference Notes  for weekly updates to the plan of care

## 2023-10-15 NOTE — Progress Notes (Signed)
Physical Therapy Session Note  Patient Details  Name: Kathy Vargas MRN: [id number] Date of Birth: Nov 02, 1951  Today's Date: 10/15/2023 PT Individual Time: 0900-1000 PT Individual Time Calculation (min): 60 min   Short Term Goals: Week 1:  PT Short Term Goal 1 (Week 1): STG = LTG due to ELOS  Skilled Therapeutic Interventions/Progress Updates:      Pt in bed on arrival - reports she's having a "rough" day and is frustrated with her L leg. LPN present for medication.   Bed mobility completed with supervision with hospital bed features. Pt with her L ASO ankle brace already on - donned tennis shoes with totalA.   Sit<>Stand and stand pivot transfer with CGA and no AD into w/c - patient with LLE weakness resulting in knee hyperextension and foot slap on L.   Transported to main rehab gym. Sit<>Stand to RW with CGA, ambulated variable distances in rehab gym ~159ft + ~70ft + ~119ft + ~159ft with CGA/minA and RW. Patient is now putting more weight on her LLE due to less ankle pain, thus showing more weakness than she previously showed. Her knee hyperextends 90% of the time and she sometimes catch's her L foot in swing phase due to insufficient ankle DF and insufficient hip/knee flexion.   *Added a rigid heel wedge to help with managing the knee hyper extension and this did help some but she still showed it ~75% of the time. So introduced a [place] Knee Cage and it was adjusted to fit. This managed her hyperextension well but the brace size was too large to where it would begin sliding down her leg as she ambulated. Asked MD to place order.  Spent time educating her on CVA deficits and how this contributes to LLE weakness. Pt with frustration regarding her progress and is upset with this, told her that her CVA is <16 week old and she's doing great in therapy overall.   Pt assisted onto Nustep and she completed x5 minutes at L6 resistance using BUE/BLE - keeping SPM > 70. Pt with increased work  of breathing, needing cues for slowing and energy conservation.   Returned to her room and patient assisted back to bed, per her request. Ended session with all needs met. Pt asking for more blankets - NT notified.   Therapy Documentation Precautions:  Precautions Precautions: Fall Precaution Comments: L ankle sprain Restrictions Weight Bearing Restrictions: No General:      Therapy/Group: Individual Therapy  Orrin Brigham 10/15/2023, 7:37 AM

## 2023-10-15 NOTE — Plan of Care (Signed)
  Problem: Consults Goal: RH STROKE PATIENT EDUCATION Description: See Patient Education module for education specifics  Outcome: Progressing   Problem: RH BOWEL ELIMINATION Goal: RH STG MANAGE BOWEL WITH ASSISTANCE Description: STG Manage Bowel with toileting Assistance. Outcome: Progressing Goal: RH STG MANAGE BOWEL W/MEDICATION W/ASSISTANCE Description: STG Manage Bowel with Medication with mod I Assistance. Outcome: Progressing   Problem: RH SAFETY Goal: RH STG ADHERE TO SAFETY PRECAUTIONS W/ASSISTANCE/DEVICE Description: STG Adhere to Safety Precautions With cues Assistance/Device. Outcome: Progressing   Problem: RH PAIN MANAGEMENT Goal: RH STG PAIN MANAGED AT OR BELOW PT'S PAIN GOAL Description: < 4 w prns Outcome: Progressing   Problem: RH KNOWLEDGE DEFICIT Goal: RH STG INCREASE KNOWLEDGE OF DIABETES Description: Patient will be able to manage prediabetes with dietary modification using educational resources independently Outcome: Progressing Goal: RH STG INCREASE KNOWLEDGE OF HYPERTENSION Description: Patient will be able to manage HTN with medications and dietary modification using educational resources independently Outcome: Progressing Goal: RH STG INCREASE KNOWLEGDE OF HYPERLIPIDEMIA Description: Patient will be able to manage HLD  with medications and dietary modification using educational resources independently Outcome: Progressing Goal: RH STG INCREASE KNOWLEDGE OF STROKE PROPHYLAXIS Description: Patient will be able to manage secondary risks with medications and dietary modification using educational resources independently Outcome: Progressing

## 2023-10-15 NOTE — Progress Notes (Signed)
Occupational Therapy Session Note  Patient Details  Name: Kathy Vargas MRN: [id number] Date of Birth: 1951/09/14  Today's Date: 10/15/2023 OT Individual Time: 4098-1191 OT Individual Time Calculation (min): 72 min    Short Term Goals: Week 1:  OT Short Term Goal 1 (Week 1): STG=LTG due to LOS  Skilled Therapeutic Interventions/Progress Updates:    1:1 Pt received in the bed and reported she wasn't feeling great today - she was better yesterday but was willing to try. Pt came to eOB with supervision with encouragement to attend to left leg as it was slow to come off the EOB. Pt picked out clothing for the day and ambulated to the bathroom to shower with RW with min A and with VC for attention to left LE and knee control (to avoid hyper extension). Pt able to shower in seated position with  setup. Pt ambulated to the recliner to dress and was able to dress with min guard for standing. Pt required A to don ASO and then shoe. Pt declined walking the gym at this time due to fatigue. In the gym focus on variety of activity to focus on hip and knee NMR and control with movement including sit <> stands without UEs (with mirror for visual feedback), maintaining right foot on block for increased forced use and control of left Le with sit to stands. In supine performed bridges and then squeezing a ball in between knees with bridge, clam shell raises in sidelying, and then rolling the yellow theraball up underneath her (from legs extended to bring them up underneath of her. Stepping up onto the 6 inch step with right LE with facilitation and cues for left knee control. Pt reports she feels all this weakness and buckling is from the fall and her hip needed to be x rayed. Discussed the weakness coming from the bleed but would share with the RN her concern for an xray - no pain at the hip noted) RN told.  Pt returned to room and left resting in the bed.   Therapy Documentation Precautions:   Precautions Precautions: Fall Precaution Comments: L ankle sprain Restrictions Weight Bearing Restrictions: No General:   Vital Signs: Therapy Vitals Temp: 98.1 F (36.7 C) Temp Source: Oral Pulse Rate: 81 BP: (!) 122/56 Patient Position (if appropriate): Lying Pain:  No c/o pain in session   Therapy/Group: Individual Therapy  Roney Mans [facility] 10/15/2023, 1:26 PM

## 2023-10-16 DIAGNOSIS — I10 Essential (primary) hypertension: Secondary | ICD-10-CM

## 2023-10-16 DIAGNOSIS — K5901 Slow transit constipation: Secondary | ICD-10-CM

## 2023-10-16 DIAGNOSIS — F411 Generalized anxiety disorder: Secondary | ICD-10-CM | POA: Diagnosis not present

## 2023-10-16 DIAGNOSIS — I610 Nontraumatic intracerebral hemorrhage in hemisphere, subcortical: Secondary | ICD-10-CM | POA: Diagnosis not present

## 2023-10-16 LAB — CBC
HCT: 39.1 % (ref 36.0–46.0)
Hemoglobin: 12.3 g/dL (ref 12.0–15.0)
MCH: 28.1 pg (ref 26.0–34.0)
MCHC: 31.5 g/dL (ref 30.0–36.0)
MCV: 89.5 fL (ref 80.0–100.0)
Platelets: 295 10*3/uL (ref 150–400)
RBC: 4.37 MIL/uL (ref 3.87–5.11)
RDW: 12.9 % (ref 11.5–15.5)
WBC: 5.6 10*3/uL (ref 4.0–10.5)
nRBC: 0 % (ref 0.0–0.2)

## 2023-10-16 LAB — BASIC METABOLIC PANEL
Anion gap: 9 (ref 5–15)
BUN: 35 mg/dL — ABNORMAL HIGH (ref 8–23)
CO2: 20 mmol/L — ABNORMAL LOW (ref 22–32)
Calcium: 9.2 mg/dL (ref 8.9–10.3)
Chloride: 103 mmol/L (ref 98–111)
Creatinine, Ser: 2.24 mg/dL — ABNORMAL HIGH (ref 0.44–1.00)
GFR, Estimated: 23 mL/min — ABNORMAL LOW (ref >60)
Glucose, Bld: 121 mg/dL — ABNORMAL HIGH (ref 70–99)
Potassium: 4.2 mmol/L (ref 3.5–5.1)
Sodium: 132 mmol/L — ABNORMAL LOW (ref 135–145)

## 2023-10-16 MED ORDER — SODIUM CHLORIDE 0.9 % IV SOLN: INTRAVENOUS | Status: AC

## 2023-10-16 MED ORDER — SENNOSIDES-DOCUSATE SODIUM 8.6-50 MG PO TABS
2.0000 | ORAL_TABLET | Freq: Two times a day (BID) | ORAL | Status: DC
  Administered 2023-10-16 – 2023-10-18 (×5): 2 via ORAL
  Filled 2023-10-16 (×9): qty 2

## 2023-10-16 NOTE — Plan of Care (Signed)
  Problem: Consults Goal: RH STROKE PATIENT EDUCATION Description: See Patient Education module for education specifics  Outcome: Progressing   Problem: RH BOWEL ELIMINATION Goal: RH STG MANAGE BOWEL WITH ASSISTANCE Description: STG Manage Bowel with toileting Assistance. Outcome: Progressing Goal: RH STG MANAGE BOWEL W/MEDICATION W/ASSISTANCE Description: STG Manage Bowel with Medication with mod I Assistance. Outcome: Progressing   Problem: RH SAFETY Goal: RH STG ADHERE TO SAFETY PRECAUTIONS W/ASSISTANCE/DEVICE Description: STG Adhere to Safety Precautions With cues Assistance/Device. Outcome: Progressing   Problem: RH PAIN MANAGEMENT Goal: RH STG PAIN MANAGED AT OR BELOW PT'S PAIN GOAL Description: < 4 w prns Outcome: Progressing   Problem: RH KNOWLEDGE DEFICIT Goal: RH STG INCREASE KNOWLEDGE OF DIABETES Description: Patient will be able to manage prediabetes with dietary modification using educational resources independently Outcome: Progressing Goal: RH STG INCREASE KNOWLEDGE OF HYPERTENSION Description: Patient will be able to manage HTN with medications and dietary modification using educational resources independently Outcome: Progressing Goal: RH STG INCREASE KNOWLEGDE OF HYPERLIPIDEMIA Description: Patient will be able to manage HLD  with medications and dietary modification using educational resources independently Outcome: Progressing Goal: RH STG INCREASE KNOWLEDGE OF STROKE PROPHYLAXIS Description: Patient will be able to manage secondary risks with medications and dietary modification using educational resources independently Outcome: Progressing

## 2023-10-16 NOTE — Progress Notes (Signed)
Occupational Therapy Session Note  Patient Details  Name: Kathy Vargas MRN: [id number] Date of Birth: 1950/12/29  Today's Date: 10/16/2023 OT Individual Time: 6578-4696 OT Individual Time Calculation (min): 73 min    Short Term Goals: Week 1:  OT Short Term Goal 1 (Week 1): STG=LTG due to LOS  Skilled Therapeutic Interventions/Progress Updates:    Patient received supine in bed - clearly frustrated.  Allowed patient time to vent frustrations relating to communication, flavor of food, seasoning, lack of care she perceives she is receiving.  Encouraged patient to ask for help or make needs known as needed.  Patient reports feeling swimmy when sitting up at edge of bed.  Patient sat at edge of bed without symptoms, and reports "it happens when I stand."  Patient assisted to stand and she had wide amount of sway to left and backward.  Patient did better once she put on shoes - more stable standing.  Worked on static stand tolerance - overt cueing for balance between BLE.  Patient with poorly graded control of muscles around hip and knee - therefore locks knee/ hyperextends knee/ unlocks knee and then collapses to surface.  Worked on controlled release of extension, active extension, versus locked knee, and controlled descent to surface.  Patient does begin to breathe heavier in standing - she attributes this to nerves more than fatigue.  Patient quickly returns to normal breathing pattern once she is seated.  Patient makes statements about "not trusting her left leg" and this leads to anxious feelings.   Listened to patient's reports of frustration and tried to redirect and encourage her.  Worked to set realistic expectations regarding her role and role of rehab staff.  Patient's daughter and sister present toward end of session.   Left up in bed with bed alarm engaged and call bell/ personal items in reach Ice applied to left lateral ankle.    Therapy Documentation Precautions:   Precautions Precautions: Fall Precaution Comments: L ankle sprain Restrictions Weight Bearing Restrictions: No  Pain:  No pain in left ankle when supine     Therapy/Group: Individual Therapy  Kathy Vargas 10/16/2023, 1:47 PM

## 2023-10-16 NOTE — Progress Notes (Signed)
Patient ID: Kathy Vargas, female   DOB: 21-Jan-1951, 72 y.o.   MRN: [id number]  Met with pt, daughter and pt's sister who are present in her room to discuss coming in tomorrow for education in preparation for discharge this week due to pt wanting to go home. Discussed the plan and daughter and sister felt pt would be more comfortable in her own home. Discussed pt needing 24/7 supervision at discharge. Pt says both she wants to go home and if not ready is ok to stay here. Discussed follow up pt wants home health and equipment needs. Will see tomorrow to discuss best plan for pt.

## 2023-10-16 NOTE — Progress Notes (Addendum)
PROGRESS NOTE   Subjective/Complaints:  Denies cp, left ankle sore but didn't even mention to me. Was upset about interaction with staff overnight. Wouldn't go into details with mer  ROS: Patient denies fever, rash, sore throat, blurred vision, dizziness, nausea, vomiting, diarrhea, cough, shortness of breath or chest pain,   headache, or mood change.      Objective:   No results found. Recent Labs    10/16/23 0515  WBC 5.6  HGB 12.3  HCT 39.1  PLT 295   Recent Labs    10/16/23 0515  NA 132*  K 4.2  CL 103  CO2 20*  GLUCOSE 121*  BUN 35*  CREATININE 2.24*  CALCIUM 9.2    Intake/Output Summary (Last 24 hours) at 10/16/2023 1013 Last data filed at 10/16/2023 0727 Gross per 24 hour  Intake 598 ml  Output --  Net 598 ml        Physical Exam: Vital Signs Blood pressure (!) 123/57, pulse 87, temperature 98.1 F (36.7 C), temperature source Oral, resp. rate 20, height 5\' 4"  (1.626 m), weight 55 kg, SpO2 100%. Constitutional: No distress . Vital signs reviewed. HEENT: NCAT, EOMI, oral membranes moist Neck: supple Cardiovascular: RRR without murmur. No JVD    Respiratory/Chest: CTA Bilaterally without wheezes or rales. Normal effort    GI/Abdomen: BS +, non-tender, non-distended Ext: no clubbing, cyanosis, or edema Psych: irritated, cooperative  Skin: intact, mild bruising on chest and bilateral arms. Strength: Antigravity against resistance in all 4 extremities                RUE: 5/5 SA, 5/5 EF, 5/5 EE, 5/5 WE, 5/5 FF, 5/5 FA                 LUE: 5/5 SA, 5/5 EF, 5/5 EE, 5/5 WE, 5/5 FF, 5/5 FA                 RLE: 5/5 HF, 5/5 KE, 5/5 DF, 5/5 EHL, 5/5 PF                 LLE:  5/5 HF, 5/5 KE, 5/5 DF, 5/5 EHL, 5/5 PF  MSK: left ankle with brace in place.  Sitting with legs crossed in gym on mat without any pain.  Assessment/Plan: 1. Functional deficits which require 3+ hours per day of interdisciplinary  therapy in a comprehensive inpatient rehab setting. Physiatrist is providing close team supervision and 24 hour management of active medical problems listed below. Physiatrist and rehab team continue to assess barriers to discharge/monitor patient progress toward functional and medical goals  Care Tool:  Bathing    Body parts bathed by patient: Right arm, Left arm, Chest, Abdomen, Front perineal area, Buttocks, Right upper leg, Left upper leg, Right lower leg, Left lower leg, Face         Bathing assist Assist Level: Contact Guard/Touching assist     Upper Body Dressing/Undressing Upper body dressing   What is the patient wearing?: Pull over shirt    Upper body assist Assist Level: Set up assist    Lower Body Dressing/Undressing Lower body dressing      What is the patient wearing?:  Pants, Underwear/pull up     Lower body assist Assist for lower body dressing: Contact Guard/Touching assist     Toileting Toileting Toileting Activity did not occur (Clothing management and hygiene only): N/A (no void or bm)  Toileting assist Assist for toileting: Minimal Assistance - Patient > 75%     Transfers Chair/bed transfer  Transfers assist     Chair/bed transfer assist level: Contact Guard/Touching assist     Locomotion Ambulation   Ambulation assist      Assist level: Contact Guard/Touching assist Assistive device: Walker-rolling Max distance: 57ft   Walk 10 feet activity   Assist     Assist level: Contact Guard/Touching assist Assistive device: Walker-rolling   Walk 50 feet activity   Assist    Assist level: Contact Guard/Touching assist Assistive device: Walker-rolling    Walk 150 feet activity   Assist Walk 150 feet activity did not occur: Safety/medical concerns (L ankle pain)         Walk 10 feet on uneven surface  activity   Assist Walk 10 feet on uneven surfaces activity did not occur: Safety/medical concerns          Wheelchair     Assist Is the patient using a wheelchair?: Yes Type of Wheelchair: Manual    Wheelchair assist level: Supervision/Verbal cueing Max wheelchair distance: 150'    Wheelchair 50 feet with 2 turns activity    Assist        Assist Level: Supervision/Verbal cueing   Wheelchair 150 feet activity     Assist      Assist Level: Supervision/Verbal cueing   Blood pressure (!) 123/57, pulse 87, temperature 98.1 F (36.7 C), temperature source Oral, resp. rate 20, height 5\' 4"  (1.626 m), weight 55 kg, SpO2 100%.  Medical Problem List and Plan: 1. Functional deficits secondary to nontraumatic ICH of th R basal ganglia, due to hypertensive emergency             -patient may shower             -ELOS/Goals: 5-7 days, Mod I PT/OT             -Continue CIR therapies including PT, OT   -asked charge nurse to check in with pt re: events of last night   2.  Antithrombotics: -DVT/anticoagulation:  Pharmaceutical: Heparin 5000 U Q8H - started 11/20, toelrating             -antiplatelet therapy: none   3. Pain Management: Tylenol, percocet 1 tab ordered q8H for severe pain as needed, Robaxin   4. Mood/Behavior/Sleep: LCSW to evaluate and provide emotional support             -Xanax 0.5 mg BID prn-increased to 1 mg yesterday, with improvement in sleep   -11-24: Changed Xanax to 0.5 to 1 mg twice daily as needed, so patient can adjust to lower dose and avoid oversedation as desired              -antipsychotic agents: n/a  11-23: Added 5 mg melatonin nightly   5. Neuropsych/cognition: This patient is capable of making decisions on her own behalf.              - Adderall 30 mg BID home med held -declined to resume 11-24 due to hypertensive causes of stroke   11/25 would continue holding for now for reasons above   6. Skin/Wound Care: Routine skin care checks   7. Fluids/Electrolytes/Nutrition:     -encourage  fluids 8: Hypertension: monitor TID and prn              -continue hydralazine 100 mg TID             -continue irbesartan 150 mg daily  -11-23: Consistently in the high 140s, add Norvasc 5 mg today  11-25: now with reasonable control. Continue current regimen     10/16/2023    9:30 AM 10/16/2023    5:08 AM 10/15/2023    8:04 PM  Vitals with BMI  Systolic 123 123 427  Diastolic 57 57 75  Pulse  87 91      9: Tobacco use/COPD: continue Nicoderm  -11-24: Added Claritin 10 mg daily (takes this or Robitussin at home almost daily ) and DuoNebs every 6 hours as needed for shortness of breath and wheezing.   -- Has had questionable anxiety related to albuterol in the past;  may use Xanax with this if needed.   10: GERD: continue Protonix   11: Likely chronic kidney disease:               - BUN/Cr up today---pt not wanting to drink much  -will begin IVF ns @ 50cc/hr  -hold avapro 150mg   -push fluids  -f/u bmet tomorrow   12: Skin tear to left hand and left elbow: wound care orders placed   13: Left ankle strain/sprain; pain over ATF with weightbearing. xrays on admission negative.              - Ice, compression, voltaren gel QID  -ASO ordered   14. Substance use (+THC)/medication noncompliance.  provide education.   15. Insomnia: Xanax increased to 1mg  home dose -Adding melatonin as above  16.  Constipation.  Patient does not want MiraLAX scheduled.  Increase Senokot to 2 tabs twice daily.  -had large hard bm 11/21  11/25 change to senna-s 2 tabs bid   17.  Left hip pain.  Exam maneuvers suspicious for osteoarthritis, patient declines x-ray.   -11/25 sitting with legs crossed today--no pain   LOS: 4 days A FACE TO FACE EVALUATION WAS PERFORMED  Ranelle Oyster 10/16/2023, 10:13 AM

## 2023-10-16 NOTE — Plan of Care (Signed)
Goals downgraded to Supervision for safety concerns.   Problem: RH Balance Goal: LTG Patient will maintain dynamic standing balance (PT) Description: LTG:  Patient will maintain dynamic standing balance with assistance during mobility activities (PT) Flowsheets (Taken 10/16/2023 1259) LTG: Pt will maintain dynamic standing balance during mobility activities with:: Supervision/Verbal cueing   Problem: RH Ambulation Goal: LTG Patient will ambulate in controlled environment (PT) Description: LTG: Patient will ambulate in a controlled environment, # of feet with assistance (PT). Flowsheets (Taken 10/16/2023 1259) LTG: Pt will ambulate in controlled environ  assist needed:: Supervision/Verbal cueing Goal: LTG Patient will ambulate in home environment (PT) Description: LTG: Patient will ambulate in home environment, # of feet with assistance (PT). Flowsheets (Taken 10/16/2023 1259) LTG: Pt will ambulate in home environ  assist needed:: Supervision/Verbal cueing

## 2023-10-16 NOTE — Progress Notes (Signed)
Physical Therapy Session Note  Patient Details  Name: Kathy Vargas MRN: [id number] Date of Birth: 12/03/1950  Today's Date: 10/16/2023 PT Individual Time: 0800-0910 + 1300-1400 PT Individual Time Calculation (min): 70 min  + 60 min  Short Term Goals: Week 1:  PT Short Term Goal 1 (Week 1): STG = LTG due to ELOS  Skilled Therapeutic Interventions/Progress Updates:      1st session: Pt in bed to start - agreeable to therapy session. Reports 5/10 L ankle pain. Rest breaks and mobility provided for pain management.   Supine<>sitting EOB with supervision. She donned pants with supervision but needed cues for hemi dressing as she had difficulty getting her LLE into the tight fitting yoga pants.   Donned socks, tennis shoes, and L ASO brace with totalA for time. Sit<>stand to RW with supervision - uses the back of her legs to assist with stability.   Pt requesting to clean her dentures - brought them to the sink for her and she ambulated with CGA and RW to the sink where she completed the hygiene needed (opening tablets) and cleaning her dentures.   Pt ambulated with CGA and RW from her room to main rehab gym, ~134ft. Continues to demonstrate L knee hyperextension in stance phase ~90% of the time and then catches her foot ~50% of the time. Offered to provide a toe cap to her shoe but patient declining this.   Pt also reporting feeling upset regarding her nursing care this weekend. Team notified of her concerns via secure chat. She appears anxious but denies needed for Rx at this time.   Supine there-ex completed for LLE strengthening: -2x15 heel slides, emphasis on control -2x15 hip abd/add, emphasis on control -2x15 bridges -2x15 LAQ with 4# ankle weight -2x15 hamstring curls with 4# ankle weight  Gait training 21ft with 4# ankle weight on LLE with RW for support - supervision and cues for slowing down pace to improve safety  Stair training using 6" steps and 2 hand rails. She  completed x12 with CGA with cues for correct sequencing. L knee instability present. Pt reporting her "head feels funny." BP checked 95/62.  Asked scheduler for family training and education tomorrow 10-12 AM with her daughter.  Becky CSW notified as well.   Pt ambulated back to her room with supervision and RW, ~167ft - cues for slowing down pace and safety awareness. Pt ended session in bed with alarm on. Spoke with NT, charge RN, and RN regarding patient's concerns.    2nd session: Pt in bed with her daughter present. LPN also present for routine vitals - WNL.   Facilitated discussion with patient and daughter re: DC plan due to uncertainty. Both confirm that there will be no 24/7 availability unless the patient discharges to her Son's in [place], [place], which the patient' doesn't want to do. Both would be open to short term SNF if needed. CSW notified.   Supine<>sitting EOB with supervision. Donned tennis shoes with totalA and she already has her L ASO ankle brace on. Still no knee cage but confirmed with LPN that it will be delivered to room soon, as the MD order is in.   Pt ambulated with supervision and RW in straight path, CGA for turns, from her room to main rehab gym, ~158ft. Cues for slowing down her pace, monitoring her L knee to prevent hyperextension vs buckling, and making sure she clears her L foot in swing phase.   Assessed balance with BERG balance test. See  results below. Intermittent seated rest breaks as needed.   Patient demonstrates increased fall risk as noted by score of  19/56 on Berg Balance Scale.  (<36= high risk for falls, close to 100%; 37-45 significant >80%; 46-51 moderate >50%; 52-55 lower >25%).  Educated patient re: performance of BERG and category for high falls risk to help improve her awareness and insight.   She ambulated with supervision and RW (CGA for turns) back to her room, ~115ft. Continues to have knee hyperextension ~90% of the time. Ended treatment  in bed, alarm on, call bell in reach.       Therapy Documentation Precautions:  Precautions Precautions: Fall Precaution Comments: L ankle sprain Restrictions Weight Bearing Restrictions: No General:   Vital Signs: Therapy Vitals Temp: 98.1 F (36.7 C) Temp Source: Oral Pulse Rate: 87 Resp: 20 BP: (!) 123/57 Patient Position (if appropriate): Lying Oxygen Therapy SpO2: 100 % O2 Device: Room Air Pain:   Mobility:   Locomotion :    Trunk/Postural Assessment :    Balance: Balance Balance Assessed: Yes Standardized Balance Assessment Standardized Balance Assessment: Berg Balance Test Berg Balance Test Sit to Stand: Able to stand  independently using hands Standing Unsupported: Able to stand 2 minutes with supervision Sitting with Back Unsupported but Feet Supported on Floor or Stool: Able to sit safely and securely 2 minutes Stand to Sit: Sits independently, has uncontrolled descent Transfers: Able to transfer safely, definite need of hands Standing Unsupported with Eyes Closed: Able to stand 3 seconds Standing Ubsupported with Feet Together: Needs help to attain position and unable to hold for 15 seconds From Standing, Reach Forward with Outstretched Arm: Reaches forward but needs supervision From Standing Position, Pick up Object from Floor: Unable to try/needs assist to keep balance From Standing Position, Turn to Look Behind Over each Shoulder: Needs supervision when turning Turn 360 Degrees: Needs assistance while turning Standing Unsupported, Alternately Place Feet on Step/Stool: Able to complete >2 steps/needs minimal assist Standing Unsupported, One Foot in Front: Loses balance while stepping or standing Standing on One Leg: Unable to try or needs assist to prevent fall Total Score: 19 Exercises:   Other Treatments:      Therapy/Group: Individual Therapy  Orrin Brigham 10/16/2023, 7:44 AM

## 2023-10-17 DIAGNOSIS — I10 Essential (primary) hypertension: Secondary | ICD-10-CM | POA: Diagnosis not present

## 2023-10-17 DIAGNOSIS — N189 Chronic kidney disease, unspecified: Secondary | ICD-10-CM

## 2023-10-17 DIAGNOSIS — F411 Generalized anxiety disorder: Secondary | ICD-10-CM | POA: Diagnosis not present

## 2023-10-17 DIAGNOSIS — N179 Acute kidney failure, unspecified: Secondary | ICD-10-CM

## 2023-10-17 DIAGNOSIS — K5901 Slow transit constipation: Secondary | ICD-10-CM | POA: Diagnosis not present

## 2023-10-17 DIAGNOSIS — I610 Nontraumatic intracerebral hemorrhage in hemisphere, subcortical: Secondary | ICD-10-CM | POA: Diagnosis not present

## 2023-10-17 LAB — BASIC METABOLIC PANEL
Anion gap: 8 (ref 5–15)
BUN: 37 mg/dL — ABNORMAL HIGH (ref 8–23)
CO2: 19 mmol/L — ABNORMAL LOW (ref 22–32)
Calcium: 9.4 mg/dL (ref 8.9–10.3)
Chloride: 104 mmol/L (ref 98–111)
Creatinine, Ser: 2.02 mg/dL — ABNORMAL HIGH (ref 0.44–1.00)
GFR, Estimated: 26 mL/min — ABNORMAL LOW (ref >60)
Glucose, Bld: 102 mg/dL — ABNORMAL HIGH (ref 70–99)
Potassium: 4.7 mmol/L (ref 3.5–5.1)
Sodium: 131 mmol/L — ABNORMAL LOW (ref 135–145)

## 2023-10-17 NOTE — Progress Notes (Signed)
PROGRESS NOTE   Subjective/Complaints:  Pt in bed after having just got out of shower. Had a much better night. In good spirits. Says she feels better with IVF running. Has felt that she's been dehydrated.   ROS: Patient denies fever, rash, sore throat, blurred vision, dizziness, nausea, vomiting, diarrhea, cough, shortness of breath or chest pain,  headache, or mood change.    Objective:   No results found. Recent Labs    10/16/23 0515  WBC 5.6  HGB 12.3  HCT 39.1  PLT 295   Recent Labs    10/16/23 0515 10/17/23 0513  NA 132* 131*  K 4.2 4.7  CL 103 104  CO2 20* 19*  GLUCOSE 121* 102*  BUN 35* 37*  CREATININE 2.24* 2.02*  CALCIUM 9.2 9.4    Intake/Output Summary (Last 24 hours) at 10/17/2023 0919 Last data filed at 10/17/2023 1610 Gross per 24 hour  Intake 960 ml  Output --  Net 960 ml        Physical Exam: Vital Signs Blood pressure (!) 153/69, pulse 79, temperature 98.4 F (36.9 C), temperature source Oral, resp. rate 16, height 5\' 4"  (1.626 m), weight 55 kg, SpO2 99%. Constitutional: No distress . Vital signs reviewed. HEENT: NCAT, EOMI, oral membranes moist Neck: supple Cardiovascular: RRR without murmur. No JVD    Respiratory/Chest: CTA Bilaterally without wheezes or rales. Normal effort    GI/Abdomen: BS +, non-tender, non-distended Ext: no clubbing, cyanosis, or edema Psych: pleasant and cooperative   Skin: numerous bruises, lacerations on all 4 limbs. Neuro: Alert and oriented x 3. Normal insight and awareness. Intact Memory. Normal language and speech. Cranial nerve exam unremarkable.  MMT: RUE: 5/5 SA, 5/5 EF, 5/5 EE, 5/5 WE, 5/5 FF, 5/5 FA                 LUE: 5/5 SA, 5/5 EF, 5/5 EE, 5/5 WE, 5/5 FF, 5/5 FA                 RLE: 5/5 HF, 5/5 KE, 5/5 DF, 5/5 EHL, 5/5 PF                 LLE:  5/5 HF, 5/5 KE, 5/5 DF, 5/5 EHL, 5/5 PF  MSK: left ankle discomfort with  ROM.  Assessment/Plan: 1. Functional deficits which require 3+ hours per day of interdisciplinary therapy in a comprehensive inpatient rehab setting. Physiatrist is providing close team supervision and 24 hour management of active medical problems listed below. Physiatrist and rehab team continue to assess barriers to discharge/monitor patient progress toward functional and medical goals  Care Tool:  Bathing    Body parts bathed by patient: Right arm, Left arm, Chest, Abdomen, Front perineal area, Buttocks, Right upper leg, Left upper leg, Right lower leg, Left lower leg, Face         Bathing assist Assist Level: Set up assist     Upper Body Dressing/Undressing Upper body dressing   What is the patient wearing?: Pull over shirt    Upper body assist Assist Level: Set up assist    Lower Body Dressing/Undressing Lower body dressing      What is the  patient wearing?: Pants, Underwear/pull up     Lower body assist Assist for lower body dressing: Supervision/Verbal cueing     Toileting Toileting Toileting Activity did not occur (Clothing management and hygiene only): N/A (no void or bm)  Toileting assist Assist for toileting: Minimal Assistance - Patient > 75%     Transfers Chair/bed transfer  Transfers assist     Chair/bed transfer assist level: Contact Guard/Touching assist     Locomotion Ambulation   Ambulation assist      Assist level: Contact Guard/Touching assist Assistive device: Walker-rolling Max distance: 66ft   Walk 10 feet activity   Assist     Assist level: Contact Guard/Touching assist Assistive device: Walker-rolling   Walk 50 feet activity   Assist    Assist level: Contact Guard/Touching assist Assistive device: Walker-rolling    Walk 150 feet activity   Assist Walk 150 feet activity did not occur: Safety/medical concerns (L ankle pain)         Walk 10 feet on uneven surface  activity   Assist Walk 10 feet on  uneven surfaces activity did not occur: Safety/medical concerns         Wheelchair     Assist Is the patient using a wheelchair?: Yes Type of Wheelchair: Manual    Wheelchair assist level: Supervision/Verbal cueing Max wheelchair distance: 150'    Wheelchair 50 feet with 2 turns activity    Assist        Assist Level: Supervision/Verbal cueing   Wheelchair 150 feet activity     Assist      Assist Level: Supervision/Verbal cueing   Blood pressure (!) 153/69, pulse 79, temperature 98.4 F (36.9 C), temperature source Oral, resp. rate 16, height 5\' 4"  (1.626 m), weight 55 kg, SpO2 99%.  Medical Problem List and Plan: 1. Functional deficits secondary to nontraumatic ICH of th R basal ganglia, due to hypertensive emergency             -patient may shower             -ELOS/Goals: probably closer to weekend given fxnl/med needs, Mod I PT/OT             -Continue CIR therapies including PT, OT   -11/26 family ed today   -PT wanting to try Swedish Knee Cage due to persistent recurvatum. Don't see that it was ever ordered--will do so now 2.  Antithrombotics: -DVT/anticoagulation:  Pharmaceutical: Heparin 5000 U Q8H - started 11/20, toelrating             -antiplatelet therapy: none   3. Pain Management: Tylenol, percocet 1 tab ordered q8H for severe pain as needed, Robaxin   4. Mood/Behavior/Sleep: LCSW to evaluate and provide emotional support             -Xanax 0.5 mg BID prn-increased to 1 mg yesterday, with improvement in sleep   -11-24: Changed Xanax to 0.5 to 1 mg twice daily as needed, so patient can adjust to lower dose and avoid oversedation as desired              -antipsychotic agents: n/a  11-23: Added 5 mg melatonin nightly   11/26 mood improved 5. Neuropsych/cognition: This patient is capable of making decisions on her own behalf.              - Adderall 30 mg BID home med held -declined to resume 11-24 due to hypertensive causes of stroke   11/26  would continue holding for  now for reasons above   6. Skin/Wound Care: Routine skin care checks   7. Fluids/Electrolytes/Nutrition:     -encourage fluids 8: Hypertension: monitor TID and prn             -continue hydralazine 100 mg TID             -continue irbesartan 150 mg daily  -11-23: Consistently in the high 140s, add Norvasc 5 mg today  11-26: fair control. Continue current regimen     10/17/2023    5:05 AM 10/16/2023    7:43 PM 10/16/2023    1:11 PM  Vitals with BMI  Systolic 153 125 098   123  Diastolic 69 66 60   57  Pulse 79 81 94      9: Tobacco use/COPD: continue Nicoderm  -11-24: Added Claritin 10 mg daily (takes this or Robitussin at home almost daily ) and DuoNebs every 6 hours as needed for shortness of breath and wheezing.   -- Has had questionable anxiety related to albuterol in the past;  may use Xanax with this if needed.   10: GERD: continue Protonix   11: Likely acute on chronic kidney disease:               - BUN/Cr sl better today--still not at baseline  -continue IVF ns @ 50cc/hr  -held avapro 150mg   -push fluids. Pt is trying to drink more  -f/u bmet again wednesday    13: Left ankle strain/sprain; pain over ATF with weightbearing. xrays on admission negative.              - Ice, compression, voltaren gel QID  -ASO ordered   14. Substance use (+THC)/medication noncompliance.  provide education.   15. Insomnia: Xanax increased to 1mg  home dose -Adding melatonin as above  16.  Constipation.  Patient does not want MiraLAX scheduled.  Increase Senokot to 2 tabs twice daily.  -had large hard bm 11/21  11/25 changed to senna-s 2 tabs bid--obsv today   -intervene tomorrow if no results today      LOS: 5 days A FACE TO FACE EVALUATION WAS PERFORMED  Ranelle Oyster 10/17/2023, 9:19 AM

## 2023-10-17 NOTE — Plan of Care (Signed)
  Problem: Consults Goal: RH STROKE PATIENT EDUCATION Description: See Patient Education module for education specifics  Outcome: Progressing   Problem: RH BOWEL ELIMINATION Goal: RH STG MANAGE BOWEL WITH ASSISTANCE Description: STG Manage Bowel with toileting Assistance. Outcome: Progressing Goal: RH STG MANAGE BOWEL W/MEDICATION W/ASSISTANCE Description: STG Manage Bowel with Medication with mod I Assistance. Outcome: Progressing   Problem: RH SAFETY Goal: RH STG ADHERE TO SAFETY PRECAUTIONS W/ASSISTANCE/DEVICE Description: STG Adhere to Safety Precautions With cues Assistance/Device. Outcome: Progressing   Problem: RH PAIN MANAGEMENT Goal: RH STG PAIN MANAGED AT OR BELOW PT'S PAIN GOAL Description: < 4 w prns Outcome: Progressing   Problem: RH KNOWLEDGE DEFICIT Goal: RH STG INCREASE KNOWLEDGE OF DIABETES Description: Patient will be able to manage prediabetes with dietary modification using educational resources independently Outcome: Progressing Goal: RH STG INCREASE KNOWLEDGE OF HYPERTENSION Description: Patient will be able to manage HTN with medications and dietary modification using educational resources independently Outcome: Progressing Goal: RH STG INCREASE KNOWLEGDE OF HYPERLIPIDEMIA Description: Patient will be able to manage HLD  with medications and dietary modification using educational resources independently Outcome: Progressing Goal: RH STG INCREASE KNOWLEDGE OF STROKE PROPHYLAXIS Description: Patient will be able to manage secondary risks with medications and dietary modification using educational resources independently Outcome: Progressing

## 2023-10-17 NOTE — Progress Notes (Signed)
Awaiting [facility]kneecage, ortho notified.   Tilden Dome, LPN

## 2023-10-17 NOTE — Progress Notes (Signed)
Orthopedic Tech Progress Note Patient Details:  Kathy Vargas 20-Oct-1951 [id number]  Called into [facility] Patient ID: Kathy Vargas, female   DOB: [birth year], 72 y.o.   MRN: [id number]  [name] 10/17/2023, 10:09 AM

## 2023-10-17 NOTE — Progress Notes (Signed)
Occupational Therapy Session Note  Patient Details  Name: Kathy Vargas MRN: [id number] Date of Birth: Dec 16, 1950  Today's Date: 10/17/2023 OT Individual Time: 4098-1191 & 1007-1100 OT Individual Time Calculation (min): 40 min & 70 min   Short Term Goals: Week 1:  OT Short Term Goal 1 (Week 1): STG=LTG due to LOS  Skilled Therapeutic Interventions/Progress Updates:  Session 1 Skilled OT intervention completed with focus on ADL retraining, functional endurance, and mobility within a shower context. Pt received seated EOB, agreeable to session. Intermittent Lt ankle pain, pre-medicated; offered moist heat via shower for pain reduction. Pt stated "I'm better today than I have been."  Pt completed all sit > stands and ambulatory transfers with CGA while using RW. Min cues needed throughout to slow her pace due to lack of awareness/appropriate time to control LLE as every time pt steps or goes to sit down, Lt knee with hyperextension. Pt also presenting with high anxiety, requiring calming techniques I.e. when wrapping IV with tape, educating pt first prior to application of tape as wrapping made pt anxious.  Stand pivot to [facility] without AD and CGA, continent of urinary void and independent in pericare. Nurse in room to disconnect IV. Pt ambulated with RW > shower.  Waterproof cover applied to IV prior to shower. Pt was able to bathe all parts with set up A, at the seated level only, excluding hair per preference. Cues needed prior to exiting shower to fully dry off and for standing with RW to assist with Lt ankle pain. CGA ambulatory transfer with RW > EOB. Able to donn shirt independently. Threaded LB clothing with set up A stood with CGA without AD to donn over hips. OT replaced foam bandages per nursing wound care order to Lt hand and LLE lacerations. Nursing made aware.   Pt remained semi upright in bed, with bed alarm on/activated, and with all needs in reach at end of session.  Session  2 Skilled OT intervention completed with focus on family education with pt's daughter present. Pt received upright in bed, agreeable to session. No pain reported.  Pt hyper fixated on leaving asap, demonstrating lack of insight into LLE deficits and mild impulsivity especially with urgency or pain level being high. Extensive conversation with pt and daughter, with daughter demonstrating appropriate awareness/rationale concern about pt DC as desired   OT provided education on the following in prep for DC: -Recommended 24/7 supervision for all mobility with RW and ADLs at current time due to lack of safety awareness and LLE weakness/hyperextension -Confirmed bathroom set up, with daughter reporting tub/shower. Advised that TTB would be safest considering CLOF, environmental set up and pt's safety/LLE deficits -Advised use of hand held shower head for ease of bathing. Demonstrated method of tucking shower curtain under buttocks to prevent water spillage in floor. Discussed using lateral leans for peri-washing and use/installation of grab bars for balance -Reviewed use of BSC at bedside to reduce fall risk at night when pt is less alert or for urgency -Pt with request about info/resources for [facility] agency to provide assist as pt and daughter report that daughter works and has family of her own and may not be able to provide; CSW notified -Discussed plan for knee brace ordered by PT to trial for more control with the knee  OT assisted pt in demonstrating the following during session: -Donning Lt ankle brace/shoes with total A CGA sit > stands and ambulatory transfers with RW <> ADL bathroom about 50 ft x2, with  cues needed for LLE control, slowing the pace to concentrate on control -Supervision transfer on TTB in/out of shower   Daughter did not assist physically this session but rather observed pt's CLOF and engaged in discussion/plan making about ways to improve safety at DC  Back in room pt remained  upright in bed with daughter present, with bed alarm on/activated, and with all needs in reach at end of session.   Therapy Documentation Precautions:  Precautions Precautions: Fall Precaution Comments: L ankle sprain Restrictions Weight Bearing Restrictions: No     Therapy/Group: Individual Therapy  Melvyn Novas, MS, OTR/L  10/17/2023, 11:43 AM

## 2023-10-17 NOTE — Progress Notes (Signed)
Physical Therapy Session Note  Patient Details  Name: Kathy Vargas MRN: [id number] Date of Birth: August 12, 1951  Today's Date: 10/17/2023 PT Individual Time: 1100-1200 + 1400-1440 PT Individual Time Calculation (min): 60 min  + 40 min  Short Term Goals: Week 1:  PT Short Term Goal 1 (Week 1): STG = LTG due to ELOS  Skilled Therapeutic Interventions/Progress Updates:      1st session: Pt positioned in bed to start - her daughter is present for family education and training. Pt with her L ASO ankle brace on as well as her tennis shoes. She still doesn't have the knee cage delivered to her room, per Dr. Riley Kill order is in and confirmed so should be arriving later this PM.   Discussed with family PT goals, PT POC, progress in therapies, etc. Reviewed precautions, importance of pacing activity and slowing down her speed, and monitoring her safety awareness due to mild impulsivity.   Bed mobility completed mod I. Pt reporting need to void before leaving the room. Stand pivot transfer with CGA and no AD to [facility] and patient continent of bladder void. Completes toileting tasks without assist.   Ambulated with RW and close supervision to CGA from her room to main rehab gym, ~164ft. She continues to show knee hyperextension ~90% of the time and sometimes she catch's her foot during swing phase, especially as she fatigues.   Reviewed stair training using 6" steps and 2 hand rails with PT providing CGA for safety. Mod cues for safety awareness and for patient to complete with appropriate step-to pattern with L foot leading ascent and R leading descent. Mod cues fading to min cues.   Reviewed car transfers with car height simulating their personal vehicle - completed with CGA and RW with safety cues for approaching and for safely turning to sit before getting her legs in/out.   Completed Nustep at L6 resistance with setupA for her L hemibody. Patient only able to complete for 45 seconds before she gets  short of breath and winded. So therefore she completed 45 second intervals up to 5 minutes with brief rest breaks b/w sets due to fatigue. HR and O2 monitored throughout, O2 > 94% and HR fluctuating b/w 70-110bpm.   Returned to her room and patient assisted back to bed. Ice pack applied to her L ankle for edema management and pain support. All needs met at end.   2nd session: Pt in bed to start - agreeable to therapy treatment. Reports generalized L ankle pain. Donned L ASO brace during treatment for added comfort and stability.   Bed mobility completed mod I. Sit<>Stand to RW with supervision. Ambulates with supervision and RW from her room to main rehab gym, ~158ft. Knee hyperextension on L ~90% of the time and catching her foot ~25% of the time.   Supine mat table there-ex for LLE: -3x20 ankle DF with red TB resistance -2x20 ankle PF with red TB resistance -2x20 hip abd/add with red TB resistance -3x20 knee to chest with red TB resistance -1x10 bridges with no resistance -2x10 bridges with red TB resistance -1x10 sidelying clamshells with no resistance -2x15 sidelying clamshells with red TB resistance.   Ambulated back to her room with similar assist and deficits as described above. Ended treatment in bed with all needs met. Alarm on.  Therapy Documentation Precautions:  Precautions Precautions: Fall Precaution Comments: L ankle sprain Restrictions Weight Bearing Restrictions: No General:      Therapy/Group: Individual Therapy  Orrin Brigham 10/17/2023,  7:40 AM

## 2023-10-17 NOTE — Progress Notes (Signed)
Patient ID: Kathy Vargas, female   DOB: 05-Nov-1951, 72 y.o.   MRN: [id number]  Met with pt and daughter who was here for family education and see the amount of care pt will require. She is pleased with how well she is doing but feels along with pt she needs to stay here longer to continue to get better. Will discuss tomorrow at team conference. Pt is on board with staying longer to get to supervision level. Daughter asked to talk to the MD and have messaged him regarding this. Will update both tomorrow after team conference. Pt waiting for knee brace hope this helps stabilize her knee. Have ordered her a rolling walker and 3 in 1 for home.

## 2023-10-18 DIAGNOSIS — I10 Essential (primary) hypertension: Secondary | ICD-10-CM | POA: Diagnosis not present

## 2023-10-18 DIAGNOSIS — F411 Generalized anxiety disorder: Secondary | ICD-10-CM | POA: Diagnosis not present

## 2023-10-18 DIAGNOSIS — I610 Nontraumatic intracerebral hemorrhage in hemisphere, subcortical: Secondary | ICD-10-CM | POA: Diagnosis not present

## 2023-10-18 DIAGNOSIS — K5901 Slow transit constipation: Secondary | ICD-10-CM | POA: Diagnosis not present

## 2023-10-18 LAB — BASIC METABOLIC PANEL
Anion gap: 8 (ref 5–15)
BUN: 29 mg/dL — ABNORMAL HIGH (ref 8–23)
CO2: 22 mmol/L (ref 22–32)
Calcium: 9.7 mg/dL (ref 8.9–10.3)
Chloride: 104 mmol/L (ref 98–111)
Creatinine, Ser: 1.85 mg/dL — ABNORMAL HIGH (ref 0.44–1.00)
GFR, Estimated: 29 mL/min — ABNORMAL LOW (ref >60)
Glucose, Bld: 113 mg/dL — ABNORMAL HIGH (ref 70–99)
Potassium: 4.4 mmol/L (ref 3.5–5.1)
Sodium: 134 mmol/L — ABNORMAL LOW (ref 135–145)

## 2023-10-18 MED ORDER — SORBITOL 70 % SOLN
30.0000 mL | Status: AC
  Administered 2023-10-18: 30 mL via ORAL
  Filled 2023-10-18: qty 30

## 2023-10-18 NOTE — Progress Notes (Addendum)
F/U'd with ortho, awaiting an update.    Tilden Dome, LPN

## 2023-10-18 NOTE — Progress Notes (Signed)
Physical Therapy Session Note  Patient Details  Name: Kathy Vargas MRN: [id number] Date of Birth: [birth year]  Today's Date: 10/18/2023 PT Individual Time: 0800-0910 PT Individual Time Calculation (min): 70 min   Short Term Goals: Week 1:  PT Short Term Goal 1 (Week 1): STG = LTG due to ELOS  Skilled Therapeutic Interventions/Progress Updates:      Pt in bed to start - reports 5/10 L ankle pain. Provided ice pack for pain management at end of treatment. Donned pants at bed level with setupA - cues for hemi technique and able to fully bridge to clear her hips to pull pants up. Supine to sitting EOB mod I level. Had patient don the ASO ankle brace herself to work on functional independence. She was able to do so as she sat EOB with supervision - able to tie the ASO laces without assist but needed min cues for managing the velcro strapping.   Sit<>stand to RW with supervision. Ambulates with supervision and RW ~166ft to main rehab gym. Primary gait deficits are knee hyperextension on her L during stance ~80% of the time. Still waiting on knee cage to be delivered.   Pt instructed in - 343ft with RW. Indicative of decreased activity tolerance compared to age matched norms.  Resting HR 90 Resting O2 99% Post test HR 105 Post test O2 93%  Educated on modifiable risk factors for CVA prevention (smoke cessation, diet, alcohol, etc). Pt reports she's been smoking her whole life and plans on stopping when she leaves. Pt inquiring on the nicotine patches and if she can get some when she leaves. Will relay to medical team.  Also provided education on the type of CVA she had (ICH) and how this affects her motor abilities, specifically her L sided weakness.   NMR mat table exercises: -prone hamstring curls 3x10 -bird dog focusing on isolating LLE and controlling movements into flexion/extension -dead bugs -clam shells 3x10 on L  Pt ambulated back to her room with supervision and RW -  improved knee stability after focusing on hamstring strengthening during the session. She still had knee hyperextension around ~75% of the time. Session ended with her sitting in recliner, BLE elevated, ice pack on LLE, all needs met.  Therapy Documentation Precautions:  Precautions Precautions: Fall Precaution Comments: L ankle sprain Restrictions Weight Bearing Restrictions: No General:     Therapy/Group: Individual Therapy  Yasser Hepp P Tabitha Riggins  PT, DPT, CSRS  10/18/2023, 7:56 AM

## 2023-10-18 NOTE — Progress Notes (Signed)
Patient ID: Kathy Vargas, female   DOB: [birth year], 72 y.o.   MRN: [id number]  met with pt to give her the team conference update regarding her progress and goals. Her goals are supervision-mod/I level and discharge date 11/30. She has done well the past two days and is less impulsive and more aware of her deficits. Daughter was here yesterday for education and have left a message for her this am to give her the update. Will await return call form daughter and pt is in agreement with plan. Home health in place via [place] and equipment in room.

## 2023-10-18 NOTE — Progress Notes (Signed)
PROGRESS NOTE   Subjective/Complaints:  Pt feeling well. Was able to sleep. No complaints today  ROS: Patient denies fever, rash, sore throat, blurred vision, dizziness, nausea, vomiting, diarrhea, cough, shortness of breath or chest pain, joint or back/neck pain, headache, or mood change.    Objective:   No results found. Recent Labs    10/16/23 0515  WBC 5.6  HGB 12.3  HCT 39.1  PLT 295   Recent Labs    10/17/23 0513 10/18/23 0446  NA 131* 134*  K 4.7 4.4  CL 104 104  CO2 19* 22  GLUCOSE 102* 113*  BUN 37* 29*  CREATININE 2.02* 1.85*  CALCIUM 9.4 9.7    Intake/Output Summary (Last 24 hours) at 10/18/2023 0841 Last data filed at 10/18/2023 0757 Gross per 24 hour  Intake 480 ml  Output --  Net 480 ml        Physical Exam: Vital Signs Blood pressure (!) 156/76, pulse 96, temperature 97.7 F (36.5 C), resp. rate 16, height 5\' 4"  (1.626 m), weight 55 kg, SpO2 98%. Constitutional: No distress . Vital signs reviewed. HEENT: NCAT, EOMI, oral membranes moist Neck: supple Cardiovascular: RRR without murmur. No JVD    Respiratory/Chest: CTA Bilaterally without wheezes or rales. Normal effort    GI/Abdomen: BS +, non-tender, non-distended Ext: no clubbing, cyanosis, or edema Psych: pleasant and cooperative  Skin: scattered, numerous bruises, lacerations on all 4 limbs. Neuro: Alert and oriented x 3. Normal insight and awareness. Intact Memory. Normal language and speech. Cranial nerve exam unremarkable. MMT is grossly 5/5 except for LLE which is 4/5. Senses pain and light touch in all 4's.   MSK: left ankle discomfort with ROM.  Assessment/Plan: 1. Functional deficits which require 3+ hours per day of interdisciplinary therapy in a comprehensive inpatient rehab setting. Physiatrist is providing close team supervision and 24 hour management of active medical problems listed below. Physiatrist and rehab team  continue to assess barriers to discharge/monitor patient progress toward functional and medical goals  Care Tool:  Bathing    Body parts bathed by patient: Right arm, Left arm, Chest, Abdomen, Front perineal area, Buttocks, Right upper leg, Left upper leg, Right lower leg, Left lower leg, Face         Bathing assist Assist Level: Set up assist     Upper Body Dressing/Undressing Upper body dressing   What is the patient wearing?: Pull over shirt    Upper body assist Assist Level: Set up assist    Lower Body Dressing/Undressing Lower body dressing      What is the patient wearing?: Pants, Underwear/pull up     Lower body assist Assist for lower body dressing: Supervision/Verbal cueing     Toileting Toileting Toileting Activity did not occur (Clothing management and hygiene only): N/A (no void or bm)  Toileting assist Assist for toileting: Minimal Assistance - Patient > 75%     Transfers Chair/bed transfer  Transfers assist     Chair/bed transfer assist level: Contact Guard/Touching assist     Locomotion Ambulation   Ambulation assist      Assist level: Contact Guard/Touching assist Assistive device: Walker-rolling Max distance: 67ft   Walk  10 feet activity   Assist     Assist level: Contact Guard/Touching assist Assistive device: Walker-rolling   Walk 50 feet activity   Assist    Assist level: Contact Guard/Touching assist Assistive device: Walker-rolling    Walk 150 feet activity   Assist Walk 150 feet activity did not occur: Safety/medical concerns (L ankle pain)         Walk 10 feet on uneven surface  activity   Assist Walk 10 feet on uneven surfaces activity did not occur: Safety/medical concerns         Wheelchair     Assist Is the patient using a wheelchair?: Yes Type of Wheelchair: Manual    Wheelchair assist level: Supervision/Verbal cueing Max wheelchair distance: 150'    Wheelchair 50 feet with 2 turns  activity    Assist        Assist Level: Supervision/Verbal cueing   Wheelchair 150 feet activity     Assist      Assist Level: Supervision/Verbal cueing   Blood pressure (!) 156/76, pulse 96, temperature 97.7 F (36.5 C), resp. rate 16, height 5\' 4"  (1.626 m), weight 55 kg, SpO2 98%.  Medical Problem List and Plan: 1. Functional deficits secondary to nontraumatic ICH of th R basal ganglia, due to hypertensive emergency             -patient may shower             -ELOS/Goals: probably closer to weekend given fxnl/med needs, Mod I PT/OT             -Continue CIR therapies including PT, OT, and SLP    -requested Swedish knee cage yesterday. Pt says she wasn't seen by orthotist--will reach out again 2.  Antithrombotics: -DVT/anticoagulation:  Pharmaceutical: Heparin 5000 U Q8H - started 11/20, toelrating             -antiplatelet therapy: none   3. Pain Management: Tylenol, percocet 1 tab ordered q8H for severe pain as needed, Robaxin   4. Mood/Behavior/Sleep: LCSW to evaluate and provide emotional support             -Xanax 0.5 mg BID prn-increased to 1 mg yesterday, with improvement in sleep   -11-24: Changed Xanax to 0.5 to 1 mg twice daily as needed, so patient can adjust to lower dose and avoid oversedation as desired              -antipsychotic agents: n/a  11-23: Added 5 mg melatonin nightly   11/26 mood improved 5. Neuropsych/cognition: This patient is capable of making decisions on her own behalf.              - Adderall 30 mg BID home med held -declined to resume 11-24 due to hypertensive causes of stroke   11/27 would continue holding for now for reasons above   6. Skin/Wound Care: Routine skin care checks   7. Fluids/Electrolytes/Nutrition:     -encourage fluids 8: Hypertension: monitor TID and prn             -continue hydralazine 100 mg TID             -continue irbesartan 150 mg daily  -11-23: Consistently in the high 140s, add Norvasc 5 mg  today  11/27: fair control. Continue current regimen     10/18/2023    3:53 AM 10/17/2023    7:25 PM 10/17/2023    1:24 PM  Vitals with BMI  Systolic 156 161 161  Diastolic 76 71 64  Pulse 96 76 87      9: Tobacco use/COPD: continue Nicoderm  -11-24: Added Claritin 10 mg daily (takes this or Robitussin at home almost daily ) and DuoNebs every 6 hours as needed for shortness of breath and wheezing.   -- Has had questionable anxiety related to albuterol in the past;  may use Xanax with this if needed.   10: GERD: continue Protonix   11: Likely acute on chronic kidney disease:    -held avapro 150mg   -push fluids. Pt is trying to drink more  -11/27 bmet improved    -IVF stopped already because it had end date.  -will hold off on resuming and see how she looks on Friday 13: Left ankle strain/sprain; pain over ATF with weightbearing. xrays on admission negative.              - Ice, compression, voltaren gel QID  -ASO ordered   14. Substance use (+THC)/medication noncompliance.  provide education.   15. Insomnia: Xanax increased to 1mg  home dose -Adding melatonin as above  16.  Constipation.  Patient does not want MiraLAX scheduled.  Increase Senokot to 2 tabs twice daily.  -had large hard bm 11/21  11/25 changed to senna-s 2 tabs bid--  11/27 still no bm--give sorbitol 30cc today      LOS: 6 days A FACE TO FACE EVALUATION WAS PERFORMED  Ranelle Oyster 10/18/2023, 8:41 AM

## 2023-10-18 NOTE — Progress Notes (Signed)
Patient received swedish knee cage.    Tilden Dome, LPN

## 2023-10-18 NOTE — Progress Notes (Signed)
Physical Therapy Session Note  Patient Details  Name: Nyoka Clippard MRN: [id number] Date of Birth: 08-25-1951  Today's Date: 10/18/2023 PT Individual Time: 1324-4010 PT Individual Time Calculation (min): 17 min  and Today's Date: 10/18/2023 PT Missed Time: 43 Minutes Missed Time Reason: Pain;Patient unwilling to participate;Patient fatigue  Short Term Goals: Week 1:  PT Short Term Goal 1 (Week 1): STG = LTG due to ELOS Week 2:     Skilled Therapeutic Interventions/Progress Updates:      Therapy Documentation Precautions:  Precautions Precautions: Fall Precaution Comments: L ankle sprain Restrictions Weight Bearing Restrictions: No General: PT Amount of Missed Time (min): 43 Minutes PT Missed Treatment Reason: Pain;Patient unwilling to participate;Patient fatigue  Pain:     Therapy/Group: Individual Therapy  Loel Dubonnet PT, DPT, CSRS 10/18/2023, 5:32 PM

## 2023-10-18 NOTE — Progress Notes (Signed)
Occupational Therapy Session Note  Patient Details  Name: Kathy Vargas MRN: [id number] Date of Birth: 1951/05/12  Today's Date: 10/18/2023 OT Individual Time: 9528-4132 OT Individual Time Calculation (min): 68 min    Short Term Goals: Week 1:  OT Short Term Goal 1 (Week 1): STG=LTG due to LOS  Skilled Therapeutic Interventions/Progress Updates:    Patient received seated in recliner with ice on left ankle.  Patient declined shower or changing her clothes, stating she showered yesterday.  Patient able to verbalize her training with tub/shower transfer with tub transfer bench from yesterday's OT session.  Continued work on discharge planning - patient able to describe apartment set up and process for completing laundry.  Worked on Doctor, hospital room, reaching to floor for [employer] on left, transporting clothing from bedroom to laundry room.   Worked on daily life skills - patient sleeps in a recliner.  Worked on being able to get herself into, positioned in recliner, apply ice to ankle, get out of recliner and fold blankets and put away on shelf.  Reviewed walker safety - encouraging two hands on walker, and if one hand needed to post in center of walker.  Patient declined further needs, brought juice, ice, crackers, and allowed patient to transfer to bed - managing bed covers independently.  Bed alarm engaged and call bell/ personal items in reach.    Therapy Documentation Precautions:  Precautions Precautions: Fall Precaution Comments: L ankle sprain Restrictions Weight Bearing Restrictions: No   Pain: Pain Assessment Pain Scale: 0-10 Pain Score: 8/10 left ankle- ice applied    Therapy/Group: Individual Therapy  Collier Salina 10/18/2023, 12:37 PM

## 2023-10-18 NOTE — Patient Care Conference (Signed)
Inpatient RehabilitationTeam Conference and Plan of Care Update Date: 10/18/2023   Time: 11:12 AM    Patient Name: Kathy Vargas      Medical Record Number: [id number]  Date of Birth: 06-Feb-1951 Sex: Female         Room/Bed: 4M12C/4M12C-01 Payor Info: Payor: [employer] MEDICARE / Plan: [facility] MEDICARE / Product Type: *No Product type* /    Admit Date/Time:  10/12/2023  5:06 PM  Primary Diagnosis:  ICH (intracerebral hemorrhage) [facility])  Hospital Problems: Principal Problem:   ICH (intracerebral hemorrhage) (HCC)    Expected Discharge Date: Expected Discharge Date: 10/21/23  Team Members Present: Physician leading conference: Dr. Fanny Dance Social Worker Present: Dossie Der, LCSW Nurse Present: Chana Bode, RN PT Present: Casimiro Needle, PT OT Present: Bretta Bang, OT PPS Coordinator present : Fae Pippin, SLP     Current Status/Progress Goal Weekly Team Focus  Bowel/Bladder   pt continent of b/b   remain continent   assess qshift and prn    Swallow/Nutrition/ Hydration               ADL's   min assist initially, close supervision currently with BADL   Supervision/ Mod I   Discharge palnning    Mobility   mod I bed mobility, supervision stand pivot transfers wtih RW, supervision gait 176ft with RW. Using a L ASO brace for ankle stability/support. Getting a knee cage for knee hyperextension on her L knee with gait. Patient can be mildly impulsive, has poor frustration tolerance, and had difficulty accepting her deficits to be related to CVA (believes they are related to her fall and her ankle sprain). She's becoming more cooperative and understanding. Her daughter was present on Tuesday 11/26 for family education and training. Unfortunately, patient does not have 24/7 care available.   mod I bed mobility and transfers. Supervision for gait  LLE NMR, safety awareness, transfers, gait training, DC planning. Getting her a toe cap for her L shoe and a knee  cage for her L knee.    Communication                Safety/Cognition/ Behavioral Observations               Pain   no c/o   remain pain free   Assess qshift and prn    Skin   Skin tear right upper arm and right hand   promote healing  assess qshift and prn      Discharge Planning:  Daughter here yesterday for education needs pt higher level before going home. Made aware will at least need supervision at discharge due to safety and awareness issues. pt willing to stay as long as needed to get as much benefit as possibel before going home. Aware could try to see if insurance would cover SNF pt does not want and unlikely if reaches supervision level   Team Discussion: Patient post ICH with AKI on CKI; Avapro on hold.  Limited by impulsivity and poor frustration tolerance.  Patient on target to meet rehab goals: yes, currently needs close supervision and verbal cues for ADLs and stand pivot transfers. Able to ambulate up to 150' using a RW with a ASO brace. Goals for discharge set for mod I overall.  *See Care Plan and progress notes for long and short-term goals.   Revisions to Treatment Plan:  ASO brace Swedish Knee Cage brace   Teaching Needs: Safety, medications, dietary modification, transfers, etc.   Current Barriers to  Discharge: Decreased caregiver support  Possible Resolutions to Barriers: Family education OP follow up services     Medical Summary Current Status: pt with left knee instability, knee cage requested. using ASO for left ankle support. Prerenal azotemia, ivf, pushing po-- labs improving. constipated  Barriers to Discharge: Medical stability   Possible Resolutions to Barriers/Weekly Focus: advance bowel program, ivf/oral fluids, knee cage for left knee   Continued Need for Acute Rehabilitation Level of Care: The patient requires daily medical management by a physician with specialized training in physical medicine and rehabilitation for the  following reasons: Direction of a multidisciplinary physical rehabilitation program to maximize functional independence : Yes Medical management of patient stability for increased activity during participation in an intensive rehabilitation regime.: Yes Analysis of laboratory values and/or radiology reports with any subsequent need for medication adjustment and/or medical intervention. : Yes   I attest that I was present, lead the team conference, and concur with the assessment and plan of the team.   Chana Bode B 10/18/2023, 2:06 PM

## 2023-10-19 NOTE — Plan of Care (Signed)
Continue current care plan 

## 2023-10-19 NOTE — Plan of Care (Signed)
Continue care plan

## 2023-10-19 NOTE — Progress Notes (Signed)
PROGRESS NOTE   Subjective/Complaints:  Pt slept well. No new issues. Excited about dc date of 11/30. Received knee cage yesterday but hasn't used it in gait yet  ROS: Patient denies fever, rash, sore throat, blurred vision, dizziness, nausea, vomiting, diarrhea, cough, shortness of breath or chest pain, joint or back/neck pain, headache, or mood change.    Objective:   No results found. No results for input(s): "WBC", "HGB", "HCT", "PLT" in the last 72 hours.  Recent Labs    10/17/23 0513 10/18/23 0446  NA 131* 134*  K 4.7 4.4  CL 104 104  CO2 19* 22  GLUCOSE 102* 113*  BUN 37* 29*  CREATININE 2.02* 1.85*  CALCIUM 9.4 9.7    Intake/Output Summary (Last 24 hours) at 10/19/2023 0911 Last data filed at 10/19/2023 0740 Gross per 24 hour  Intake 720 ml  Output --  Net 720 ml        Physical Exam: Vital Signs Blood pressure 133/73, pulse 87, temperature 98.3 F (36.8 C), resp. rate 17, height 5\' 4"  (1.626 m), weight 55 kg, SpO2 97%. Constitutional: No distress . Vital signs reviewed. HEENT: NCAT, EOMI, oral membranes moist Neck: supple Cardiovascular: RRR without murmur. No JVD    Respiratory/Chest: CTA Bilaterally without wheezes or rales. Normal effort    GI/Abdomen: BS +, non-tender, non-distended Ext: no clubbing, cyanosis, or edema Psych: pleasant and cooperative  Skin: scattered, numerous bruises, lacerations on all 4 limbs. Neuro: Alert and oriented x 3. Normal insight and awareness. Intact Memory. Normal language and speech. Cranial nerve exam unremarkable. MMT is grossly 5/5 except for LLE which is 4/5. Senses pain and light touch in all 4's.--neuro exam stable 11/28   MSK: mild left ankle discomfort with ROM.  Assessment/Plan: 1. Functional deficits which require 3+ hours per day of interdisciplinary therapy in a comprehensive inpatient rehab setting. Physiatrist is providing close team supervision  and 24 hour management of active medical problems listed below. Physiatrist and rehab team continue to assess barriers to discharge/monitor patient progress toward functional and medical goals  Care Tool:  Bathing    Body parts bathed by patient: Right arm, Left arm, Chest, Abdomen, Front perineal area, Buttocks, Right upper leg, Left upper leg, Right lower leg, Left lower leg, Face         Bathing assist Assist Level: Set up assist     Upper Body Dressing/Undressing Upper body dressing   What is the patient wearing?: Pull over shirt    Upper body assist Assist Level: Set up assist    Lower Body Dressing/Undressing Lower body dressing      What is the patient wearing?: Pants, Underwear/pull up     Lower body assist Assist for lower body dressing: Supervision/Verbal cueing     Toileting Toileting Toileting Activity did not occur (Clothing management and hygiene only): N/A (no void or bm)  Toileting assist Assist for toileting: Minimal Assistance - Patient > 75%     Transfers Chair/bed transfer  Transfers assist     Chair/bed transfer assist level: Contact Guard/Touching assist     Locomotion Ambulation   Ambulation assist      Assist level: Contact Guard/Touching assist  Assistive device: Walker-rolling Max distance: 72ft   Walk 10 feet activity   Assist     Assist level: Contact Guard/Touching assist Assistive device: Walker-rolling   Walk 50 feet activity   Assist    Assist level: Contact Guard/Touching assist Assistive device: Walker-rolling    Walk 150 feet activity   Assist Walk 150 feet activity did not occur: Safety/medical concerns (L ankle pain)         Walk 10 feet on uneven surface  activity   Assist Walk 10 feet on uneven surfaces activity did not occur: Safety/medical concerns         Wheelchair     Assist Is the patient using a wheelchair?: Yes Type of Wheelchair: Manual    Wheelchair assist level:  Supervision/Verbal cueing Max wheelchair distance: 150'    Wheelchair 50 feet with 2 turns activity    Assist        Assist Level: Supervision/Verbal cueing   Wheelchair 150 feet activity     Assist      Assist Level: Supervision/Verbal cueing   Blood pressure 133/73, pulse 87, temperature 98.3 F (36.8 C), resp. rate 17, height 5\' 4"  (1.626 m), weight 55 kg, SpO2 97%.  Medical Problem List and Plan: 1. Functional deficits secondary to nontraumatic ICH of th R basal ganglia, due to hypertensive emergency             -patient may shower             -ELOS/Goals: 11/30             -Continue CIR therapies including PT, OT, and SLP     -pt received Swedish knee cage yesterday.  Needs to try with gait before she goes 2.  Antithrombotics: -DVT/anticoagulation:  Pharmaceutical: Heparin 5000 U Q8H - started 11/20, toelrating             -antiplatelet therapy: none   3. Pain Management: Tylenol, percocet 1 tab ordered q8H for severe pain as needed, Robaxin   4. Mood/Behavior/Sleep: LCSW to evaluate and provide emotional support             -Xanax 0.5 mg BID prn-increased to 1 mg yesterday, with improvement in sleep   -11-24: Changed Xanax to 0.5 to 1 mg twice daily as needed, so patient can adjust to lower dose and avoid oversedation as desired              -antipsychotic agents: n/a  11-23: Added 5 mg melatonin nightly   11/28 mood improved 5. Neuropsych/cognition: This patient is capable of making decisions on her own behalf.              - Adderall 30 mg BID home med held -declined to resume 11-24 due to hypertensive causes of stroke   11/28 would continue holding for now for reasons above--will reiterate at discharge   6. Skin/Wound Care: Routine skin care checks   7. Fluids/Electrolytes/Nutrition:     -encourage fluids 8: Hypertension: monitor TID and prn             -continue hydralazine 100 mg TID             -continue irbesartan 150 mg daily  -11-23:  Consistently in the high 140s, add Norvasc 5 mg today  11/28: improved control. Continue current regimen     10/19/2023    4:32 AM 10/18/2023    7:39 PM 10/18/2023    1:39 PM  Vitals with BMI  Systolic 133  146 131  Diastolic 73 80 65  Pulse 87 93 85      9: Tobacco use/COPD: continue Nicoderm  -11-24: Added Claritin 10 mg daily (takes this or Robitussin at home almost daily ) and DuoNebs every 6 hours as needed for shortness of breath and wheezing.   -- Has had questionable anxiety related to albuterol in the past;  may use Xanax with this if needed.   10: GERD: continue Protonix   11: Likely acute on chronic kidney disease:    -held avapro 150mg   -push fluids. Pt is trying to drink more  -11/27 bmet improved    -11/28 off IVF. Dc NSL, recheck bmet tomorrow 13: Left ankle strain/sprain; pain over ATF with weightbearing. xrays on admission negative.              - Ice, compression, voltaren gel QID  -has ASO  14. Substance use (+THC)/medication noncompliance.  provide education.   15. Insomnia: Xanax increased to 1mg  home dose -Adding melatonin as above  16.  Constipation.  Patient does not want MiraLAX scheduled.  Increase Senokot to 2 tabs twice daily.  -had large hard bm 11/21  11/25 changed to senna-s 2 tabs bid--  11/28- pt had bm 11/27 after sorbitol      LOS: 7 days A FACE TO FACE EVALUATION WAS PERFORMED  Ranelle Oyster 10/19/2023, 9:11 AM

## 2023-10-20 ENCOUNTER — Other Ambulatory Visit (HOSPITAL_COMMUNITY): Payer: Self-pay

## 2023-10-20 LAB — BASIC METABOLIC PANEL
Anion gap: 10 (ref 5–15)
BUN: 30 mg/dL — ABNORMAL HIGH (ref 8–23)
CO2: 21 mmol/L — ABNORMAL LOW (ref 22–32)
Calcium: 9.6 mg/dL (ref 8.9–10.3)
Chloride: 103 mmol/L (ref 98–111)
Creatinine, Ser: 1.87 mg/dL — ABNORMAL HIGH (ref 0.44–1.00)
GFR, Estimated: 28 mL/min — ABNORMAL LOW (ref >60)
Glucose, Bld: 117 mg/dL — ABNORMAL HIGH (ref 70–99)
Potassium: 3.9 mmol/L (ref 3.5–5.1)
Sodium: 134 mmol/L — ABNORMAL LOW (ref 135–145)

## 2023-10-20 MED ORDER — AMLODIPINE BESYLATE 5 MG PO TABS
5.0000 mg | ORAL_TABLET | Freq: Every day | ORAL | 0 refills | Status: DC
  Filled 2023-10-20: qty 30, 30d supply, fill #0

## 2023-10-20 MED ORDER — DICLOFENAC SODIUM 1 % EX GEL
2.0000 g | Freq: Four times a day (QID) | CUTANEOUS | 0 refills | Status: AC
  Filled 2023-10-20: qty 100, 13d supply, fill #0

## 2023-10-20 MED ORDER — SENNOSIDES-DOCUSATE SODIUM 8.6-50 MG PO TABS: 2.0000 | ORAL_TABLET | Freq: Two times a day (BID) | ORAL | Status: AC

## 2023-10-20 MED ORDER — LORATADINE 10 MG PO TABS: 10.0000 mg | ORAL_TABLET | Freq: Every day | ORAL | Status: AC

## 2023-10-20 MED ORDER — HYDRALAZINE HCL 100 MG PO TABS
100.0000 mg | ORAL_TABLET | Freq: Three times a day (TID) | ORAL | 0 refills | Status: DC
  Filled 2023-10-20: qty 90, 30d supply, fill #0

## 2023-10-20 MED ORDER — ACETAMINOPHEN 325 MG PO TABS: 325.0000 mg | ORAL_TABLET | ORAL | Status: AC | PRN

## 2023-10-20 MED ORDER — NICOTINE 21 MG/24HR TD PT24
21.0000 mg | MEDICATED_PATCH | Freq: Every day | TRANSDERMAL | 0 refills | Status: DC
  Filled 2023-10-20 (×2): qty 14, 14d supply, fill #0

## 2023-10-20 NOTE — Progress Notes (Signed)
Occupational Therapy Discharge Summary  Patient Details  Name: Kathy Vargas MRN: [id number] Date of Birth: Mar 25, 1951  Date of Discharge from OT service:October 20, 2023  Today's Date: 10/20/2023 OT Individual Time: 1002-1100 OT Individual Time Calculation (min): 58 min   Skilled Therapeutic Intervention:   Patient received seated in recliner.  Patient has bags packed and sitting on bed in anticipation of her discharge likely later today.  Patient declined shower stating "I do not want to be cold."  Patient expressing concerns about her mental health and frequent and severe panic attacks.  Advised her to speak with her primary doctor to determine best course of treatment.  Patient shared that she accidentally sat on cleaning probe of toilet and has sore tailbone.  Patient with small red area at tailbone - padding applied to improve comfort sitting, and patient felt relief with adding a pillow to her recliner seat.   Patient now with knee cage, and able to don/doff independently.  Patient able to walk in room with modified independence using her RW.  Patient abel to toilet herself without help!  Patient cleaned her clothing items and finished packing her toiletries to address dynamic stand balance.  Patient left up in recliner with sister visiting.    Patient has met 6 of 6 long term goals due to improved activity tolerance, improved balance, ability to compensate for deficits, functional use of  LEFT lower extremity, and improved coordination.  Patient to discharge at overall Modified Independent level.  Patient's care partner is independent to provide the necessary physical assistance at discharge.    Reasons goals not met: NA  Equipment: BSC, recommended tub transfer bench  Reasons for discharge: treatment goals met and discharge from hospital  Patient/family agrees with progress made and goals achieved: Yes  OT Discharge Precautions/Restrictions  Precautions Precautions:  Fall Precaution Comments: L hemi, L ankle sprain Required Braces or Orthoses: Other Brace Splint/Cast: L ankle ASO Other Brace: L knee cage Restrictions Weight Bearing Restrictions: No  Pain Pain Assessment Pain Score: 7  Pain Type: Acute pain Pain Location: Buttocks Pain Orientation: Medial Pain Descriptors / Indicators: Aching Pain Onset: Gradual Patients Stated Pain Goal: 0 Pain Intervention(s): Repositioned Multiple Pain Sites: No ADL ADL Eating: Independent Where Assessed-Eating: Chair Grooming: Modified independent Where Assessed-Grooming: Chair Upper Body Bathing: Modified independent Where Assessed-Upper Body Bathing: Shower Lower Body Bathing: Modified independent Where Assessed-Lower Body Bathing: Shower Upper Body Dressing: Modified independent (Device) Where Assessed-Upper Body Dressing: Sitting at sink Lower Body Dressing: Modified independent Where Assessed-Lower Body Dressing: Chair Toileting: Modified independent Where Assessed-Toileting: Teacher, adult education: Engineer, agricultural Method: Insurance claims handler: Modified independent Web designer Method: Ship broker: Insurance underwriter: Modified independent Film/video editor Method: Ambulating ADL Comments: Declined shower, needing coaxing to dress Vision Baseline Vision/History: 1 Wears glasses Patient Visual Report: No change from baseline Vision Assessment?: No apparent visual deficits;Wears glasses for reading Perception  Perception: Within Functional Limits Praxis Praxis: WFL Cognition Cognition Overall Cognitive Status: Within Functional Limits for tasks assessed Arousal/Alertness: Awake/alert Orientation Level: Person;Place;Situation Person: Oriented Place: Oriented Situation: Oriented Memory: Appears intact Attention: Sustained;Selective Sustained Attention: Appears intact Selective Attention: Appears  intact Selective Attention Impairment: Functional basic Awareness: Appears intact Awareness Impairment: Anticipatory impairment Problem Solving: Appears intact Problem Solving Impairment: Verbal basic Behaviors: Poor frustration tolerance Safety/Judgment: Appears intact Brief Interview for Mental Status (BIMS) Repetition of Three Words (First Attempt): 3 Temporal Orientation: Year: Correct Temporal Orientation: Month: Accurate within 5 days Temporal  Orientation: Day: Correct Recall: "Sock": Yes, no cue required Recall: "Blue": Yes, no cue required Recall: "Bed": Yes, no cue required BIMS Summary Score: 15 Sensation Sensation Light Touch: Appears Intact Hot/Cold: Appears Intact Proprioception: Appears Intact Motor  Motor Motor: Hemiplegia Mobility  Bed Mobility Bed Mobility: Supine to Sit;Sit to Supine Supine to Sit: Independent Sit to Supine: Independent Transfers Sit to Stand: Independent with assistive device Stand to Sit: Independent with assistive device  Trunk/Postural Assessment  Cervical Assessment Cervical Assessment: Within Functional Limits Thoracic Assessment Thoracic Assessment: Within Functional Limits Lumbar Assessment Lumbar Assessment: Within Functional Limits Postural Control Postural Control: Within Functional Limits  Balance Balance Balance Assessed: Yes Static Sitting Balance Static Sitting - Balance Support: Feet supported;No upper extremity supported Static Sitting - Level of Assistance: 7: Independent Dynamic Sitting Balance Dynamic Sitting - Balance Support: Feet supported;No upper extremity supported Static Standing Balance Static Standing - Balance Support: Bilateral upper extremity supported;During functional activity Static Standing - Level of Assistance: 6: Modified independent (Device/Increase time) Dynamic Standing Balance Dynamic Standing - Balance Support: Bilateral upper extremity supported;During functional activity Dynamic  Standing - Level of Assistance: 5: Stand by assistance Extremity/Trunk Assessment RUE Assessment RUE Assessment: Within Functional Limits LUE Assessment LUE Assessment: Within Functional Limits   Collier Salina 10/20/2023, 1:01 PM

## 2023-10-20 NOTE — Progress Notes (Signed)
PROGRESS NOTE   Subjective/Complaints:  No new issues. Pt had some issues again with staff yesterday with transfer onto toilet. Apparently struck sacrum on attachment. Asked if she could go home today instead of tomorrow  ROS: Patient denies fever, rash, sore throat, blurred vision, dizziness, nausea, vomiting, diarrhea, cough, shortness of breath or chest pain, joint or back/neck pain, headache, or mood change.    Objective:   No results found. No results for input(s): "WBC", "HGB", "HCT", "PLT" in the last 72 hours.  Recent Labs    10/18/23 0446 10/20/23 0345  NA 134* 134*  K 4.4 3.9  CL 104 103  CO2 22 21*  GLUCOSE 113* 117*  BUN 29* 30*  CREATININE 1.85* 1.87*  CALCIUM 9.7 9.6    Intake/Output Summary (Last 24 hours) at 10/20/2023 1045 Last data filed at 10/20/2023 0810 Gross per 24 hour  Intake 477 ml  Output --  Net 477 ml        Physical Exam: Vital Signs Blood pressure 137/75, pulse 91, temperature 98.1 F (36.7 C), resp. rate 16, height 5\' 4"  (1.626 m), weight 55 kg, SpO2 99%. Constitutional: No distress . Vital signs reviewed. HEENT: NCAT, EOMI, oral membranes moist Neck: supple Cardiovascular: RRR without murmur. No JVD    Respiratory/Chest: CTA Bilaterally without wheezes or rales. Normal effort    GI/Abdomen: BS +, non-tender, non-distended Ext: no clubbing, cyanosis, or edema Psych: pleasant and cooperative  Skin: scattered, numerous bruises, lacerations on all 4 limbs. Stable, foam dressings.  Neuro: Alert and oriented x 3. Normal insight and awareness. Intact Memory. Normal language and speech. Cranial nerve exam unremarkable. MMT is grossly 5/5 except for LLE which is 4/5. Senses pain and light touch in all 4's.--neuro exam stable 11/29   MSK: mild left ankle discomfort with ROM. Sacral area sl sore but no bruising/swelling/or breakdown  Assessment/Plan: 1. Functional deficits which  require 3+ hours per day of interdisciplinary therapy in a comprehensive inpatient rehab setting. Physiatrist is providing close team supervision and 24 hour management of active medical problems listed below. Physiatrist and rehab team continue to assess barriers to discharge/monitor patient progress toward functional and medical goals  Care Tool:  Bathing    Body parts bathed by patient: Right arm, Left arm, Chest, Abdomen, Front perineal area, Buttocks, Right upper leg, Left upper leg, Right lower leg, Left lower leg, Face         Bathing assist Assist Level: Independent with assistive device     Upper Body Dressing/Undressing Upper body dressing   What is the patient wearing?: Pull over shirt    Upper body assist Assist Level: Independent with assistive device    Lower Body Dressing/Undressing Lower body dressing      What is the patient wearing?: Pants, Underwear/pull up     Lower body assist Assist for lower body dressing: Independent with assitive device     Toileting Toileting Toileting Activity did not occur (Clothing management and hygiene only): N/A (no void or bm)  Toileting assist Assist for toileting: Independent with assistive device     Transfers Chair/bed transfer  Transfers assist     Chair/bed transfer assist level: Independent with  assistive device Chair/bed transfer assistive device: Geologist, engineering   Ambulation assist      Assist level: Independent with assistive device Assistive device: Walker-rolling Max distance: 150'   Walk 10 feet activity   Assist     Assist level: Independent with assistive device Assistive device: Walker-rolling   Walk 50 feet activity   Assist    Assist level: Independent with assistive device Assistive device: Walker-rolling, Orthosis    Walk 150 feet activity   Assist Walk 150 feet activity did not occur: Safety/medical concerns (L ankle pain)  Assist level: Independent  with assistive device Assistive device: Walker-rolling, Orthosis    Walk 10 feet on uneven surface  activity   Assist Walk 10 feet on uneven surfaces activity did not occur: Safety/medical concerns   Assist level: Supervision/Verbal cueing Assistive device: Walker-rolling, Orthosis   Wheelchair     Assist Is the patient using a wheelchair?: No Type of Wheelchair: Manual    Wheelchair assist level: Supervision/Verbal cueing Max wheelchair distance: 150'    Wheelchair 50 feet with 2 turns activity    Assist        Assist Level: Supervision/Verbal cueing   Wheelchair 150 feet activity     Assist      Assist Level: Supervision/Verbal cueing   Blood pressure 137/75, pulse 91, temperature 98.1 F (36.7 C), resp. rate 16, height 5\' 4"  (1.626 m), weight 55 kg, SpO2 99%.  Medical Problem List and Plan: 1. Functional deficits secondary to nontraumatic ICH of th R basal ganglia, due to hypertensive emergency             May go home today. SW trying to reach daughter              -pt received Swedish knee cage and I witnessed her using it today on stairs. She did very well! -needs f/u with Neuro/[facility]/PCP/nephrology 2.  Antithrombotics: -DVT/anticoagulation:  Pharmaceutical: Heparin 5000 U Q8H - started 11/20, toelrating             -antiplatelet therapy: none   3. Pain Management: Tylenol, percocet 1 tab ordered q8H for severe pain as needed, Robaxin   4. Mood/Behavior/Sleep: LCSW to evaluate and provide emotional support             -Xanax 0.5 mg BID prn-increased to 1 mg yesterday, with improvement in sleep   -11-24: Changed Xanax to 0.5 to 1 mg twice daily as needed, so patient can adjust to lower dose and avoid oversedation as desired              -antipsychotic agents: n/a  11-23: Added 5 mg melatonin nightly   11/29 mood overall improved 5. Neuropsych/cognition: This patient is capable of making decisions on her own behalf.              - Adderall 30  mg BID home med held -declined to resume 11-24 due to hypertensive causes of stroke   11/29 would continue holding for now for reasons above--will reiterate at discharge   6. Skin/Wound Care: Routine skin care checks   7. Fluids/Electrolytes/Nutrition:     -encourage fluids 8: Hypertension: monitor TID and prn             -continue hydralazine 100 mg TID             -continue irbesartan 150 mg daily  -11-23: Consistently in the high 140s, add Norvasc 5 mg today  11/29: improved control. Continue current regimen as  outpt at least until follow up with primary     10/20/2023    5:48 AM 10/19/2023    7:13 PM 10/19/2023   12:43 PM  Vitals with BMI  Systolic 137 150 564  Diastolic 75 74 57  Pulse 91 89 94      9: Tobacco use/COPD: continue Nicoderm  -11-24: Added Claritin 10 mg daily (takes this or Robitussin at home almost daily ) and DuoNebs every 6 hours as needed for shortness of breath and wheezing.   -- Has had questionable anxiety related to albuterol in the past;  may use Xanax with this if needed.   10: GERD: continue Protonix   11: Likely acute on chronic kidney disease:    -held avapro 150mg   -push fluids. Pt is trying to drink more  -11/27 bmet improved    -11/29 bun/cr unchanged from yesterday.    -continue adequate fluid   -needs outpt nephro/uro follow up 13: Left ankle strain/sprain; pain over ATF with weightbearing. xrays on admission negative.              - Ice, compression, voltaren gel QID  -has ASO  14. Substance use (+THC)/medication noncompliance.  provide education.   15. Insomnia: Xanax increased to 1mg  home dose -Adding melatonin as above  16.  Constipation.  Patient does not want MiraLAX scheduled.  Increase Senokot to 2 tabs twice daily.  -had large hard bm 11/21  11/25 changed to senna-s 2 tabs bid--  - pt had bm 11/27 after sorbitol      LOS: 8 days A FACE TO FACE EVALUATION WAS PERFORMED  Ranelle Oyster 10/20/2023, 10:45 AM

## 2023-10-20 NOTE — Progress Notes (Addendum)
Patient ID: Kathy Vargas, female   DOB: Apr 03, 1951, 72 y.o.   MRN: [id number] Pt requesting to go home today instead of tomorrow. MD and team feel has met her goals and made mod/I in room today. Pt reports has left message for daughter and this worker has also left a message since call goes directly to voice mail. Will await return call from daughter. Pt will also try to reach daughter.  12:04 PM According to pt daughter is happier with discharge today rather than tomorrow. She will come after her therapy this afternoon. Have let team know this and PA.

## 2023-10-20 NOTE — Progress Notes (Addendum)
Inpatient Rehabilitation Care Coordinator Discharge Note   Patient Details  Name: Kathy Vargas MRN: [id number] Date of Birth: 26-Aug-1951   Discharge location: HOME WITH DAUGHTER PROVIDING SHORT TERM 24/7 MAYBE ONE WEEK THEN INTERMITTENT BETWEEN DAUGHTER AND PT'S SISTER  Length of Stay: 8 DAYS  Discharge activity level: MOD/I-SUPERVISION LEVEL  Home/community participation: ACTIVE  Patient response MW:UXLKGM Literacy - How often do you need to have someone help you when you read instructions, pamphlets, or other written material from your doctor or pharmacy?: Never  Patient response WN:UUVOZD Isolation - How often do you feel lonely or isolated from those around you?: Never  Services provided included: MD, RD, PT, OT, RN, CM, TR, Pharmacy, Neuropsych, SW  Financial Services:  Field seismologist Utilized: Private Insurance [employer]  Choices offered to/list presented to: PT AND DAUGHTER  Follow-up services arranged:  Home Health, DME, Patient/Family has no preference for HH/DME agencies Home Health Agency: [facility]  PT  & OT    DME : [facility]  ROLLING WALKER AND 3 IN 1  HAVE FAXED STD AND FMLA FORMS TO COMPANY AND GAVE THE ORIGINALS BACK TO PT. LETTER GIVEN TO DAUGHTER REGARDING MOM'S HOSPITALIZATION  Patient response to transportation need: Is the patient able to respond to transportation needs?: Yes In the past 12 months, has lack of transportation kept you from medical appointments or from getting medications?: No In the past 12 months, has lack of transportation kept you from meetings, work, or from getting things needed for daily living?: No   Patient/Family verbalized understanding of follow-up arrangements:  Yes  Individual responsible for coordination of the follow-up plan: SELF ANF Kathy Vargas [phone]  Confirmed correct DME delivered: Lucy Chris 10/20/2023    Comments (or additional information):PT EXCEEDED HER GOALS AND  MADE MOD/I IN HER ROOM 11/29. BETWEEN DAUGHTER AND SISTER CAN PROVIDE 24/7 SHORT TIME-7 DAYS. PT DOES WHAT SHE WANTS TO DO AT TIMES SHE IS NOT SAFE AND AT HIGH RISK TO FALL. SHE IS AWARE OF THIS  Summary of Stay    Date/Time Discharge Planning CSW  10/18/23 5956 Daughter here yesterday for education needs pt higher level before going home. Made aware will at least need supervision at discharge due to safety and awareness issues. pt willing to stay as long as needed to get as much benefit as possibel before going home. Aware could try to see if insurance would cover SNF pt does not want and unlikely if reaches supervision level RGD       Jermell Holeman, Lemar Livings

## 2023-10-20 NOTE — Progress Notes (Signed)
Physical Therapy Session Note  Patient Details  Name: Kathy Vargas MRN: [id number] Date of Birth: May 18, 1951  Today's Date: 10/20/2023 PT Individual Time: 0800-0910 + 1300-1355 PT Individual Time Calculation (min): 70 min  + 55 min  Short Term Goals: Week 1:  PT Short Term Goal 1 (Week 1): STG = LTG due to ELOS  Skilled Therapeutic Interventions/Progress Updates:      1st session: Pt in bed to start - agreeable to therapy treatment. No reports of resting pain.   Bed mobility completed mod I. Donned jacket, tennis shoes, L ASO brace at mod I level as she sat EOB. Pt reports frustration re: yesterday - being cold in her room and accidentally sitting down on the toilet bidet - MD made aware during rounding.   Pt's Sweedish knee cage had been delivered to her room - educated pt on wear schedule and purpose of it - she voiced understanding. She also has her toe capped shoe delivered from [facility].   Sit<>stand to RW mod I level. Ambulates with distant supervision and RW from her room to main rehab gym, ~167ft. Pt with improved knee stability, stride length, and foot clearance on the L. Pt reports improved confidence and less fear of falling, as well.   Spent time adjusting the knee cage to better fit - needing tightening x2 slots.   Pt wanting to DC home today after therapies, not wanting to wait until tomorrow. Not a problem from PT standpoint - messaged team re: her requests. Patient made mod I in the room to help improve confidence with transition - educated on safety precautions (wearing L ASO, wearing L knee cage, wearing tennis shoes, and using the RW). Pt voiced understanding.   Pt completed car transfer with setupA with assist for RW management. She was educated on how to fold the the RW for management and she could complete this without assist after practice.   Gait training without using the RW to challenge stability and balance with higher level gait training - pt needing  CGA for safety while ambulating without an AD. Increased knee instability and more unsteady compared to without the RW. Educated how using only fingertip support on the RW to work on progressing off of it. Pt wanting a soda from the vending machine - able to manage money and purchase without assist - mild L knee buckling while squatting down to pick up the soda from bottom shelf.   Pt ended the session seated in recliner. Reviewed again how do adjust the knee cage and how it should fit behind the crease of her knee - she voiced understanding. Pt ended treatment sitting in recliner with all needs met. Pt made mod I in the room and sign outside door to indicate.    2nd session: Pt in bed to start and in agreement to therapy treatment. Has no complaints of pain. Bed mobility completed at mod I level. Has her knee cage on her L knee - she requests it to be tightened another notch. Sit<>Stand mod I to RW and she ambulates mod I with RW to main rehab gym, ~157ft.   Pt instructed in BERG balance test with intermittent seated rest breaks as needed. Results of test outlined below.   Patient demonstrates increased fall risk as noted by score of   36/56 on Berg Balance Scale.  (<36= high risk for falls, close to 100%; 37-45 significant >80%; 46-51 moderate >50%; 52-55 lower >25%)  TUG completed with RW: 27.5 seconds *Scores >  13.5 seconds indicate increased falls risk.  5xSTS from arm chair without UE support 1 minute and 20 seconds *Scores >15 seconds indicate increased falls risk *increased time due to low sitting chair and multiple "failed" attempts at standing.   HEP provided:   Access Code: [email or web address] URL: [email or web address] Date: 10/20/2023 Prepared by: Wynelle Link  Exercises - Bird Dog  - 1 x daily - 7 x weekly - 3 sets - 10 reps - Supine Dead Bug with Leg Extension  - 1 x daily - 7 x weekly - 3 sets - 10 reps - Clam with Resistance  - 1 x daily - 7 x weekly - 3 sets  - 10 reps - Supine Bridge with Resistance Band  - 1 x daily - 7 x weekly - 3 sets - 10 reps - Prone Knee Flexion  - 1 x daily - 7 x weekly - 3 sets - 10 reps - Mini Squat with Counter Support  - 1 x daily - 7 x weekly - 3 sets - 10 reps - Standing Hip Abduction with Counter Support  - 1 x daily - 7 x weekly - 3 sets - 10 reps - Toe Raises with Counter Support  - 1 x daily - 7 x weekly - 3 sets - 10 reps - Heel Raises with Counter Support  - 1 x daily - 7 x weekly - 3 sets - 10 reps  Pt wanting to return to her room to void. Ambulated mod I 120ft back to his room with RW and ended treatment sitting on toilet with all needs met. Noticed small skin tear on L PCP joint of 1st finger - LPN made aware. She missed the last 20 minutes of therapy for toileting.    Therapy Documentation Precautions:  Precautions Precautions: Fall Precaution Comments: L ankle sprain Restrictions Weight Bearing Restrictions: No General:       Balance: Balance Balance Assessed: Yes Standardized Balance Assessment Standardized Balance Assessment: Berg Balance Test Berg Balance Test Sit to Stand: Able to stand without using hands and stabilize independently Standing Unsupported: Able to stand safely 2 minutes Sitting with Back Unsupported but Feet Supported on Floor or Stool: Able to sit safely and securely 2 minutes Stand to Sit: Sits safely with minimal use of hands Transfers: Able to transfer safely, definite need of hands Standing Unsupported with Eyes Closed: Able to stand 10 seconds with supervision Standing Ubsupported with Feet Together: Able to place feet together independently and stand for 1 minute with supervision From Standing, Reach Forward with Outstretched Arm: Can reach forward >5 cm safely (2") From Standing Position, Pick up Object from Floor: Able to pick up shoe, needs supervision From Standing Position, Turn to Look Behind Over each Shoulder: Turn sideways only but maintains balance Turn  360 Degrees: Needs close supervision or verbal cueing Standing Unsupported, Alternately Place Feet on Step/Stool: Able to complete >2 steps/needs minimal assist Standing Unsupported, One Foot in Front: Able to take small step independently and hold 30 seconds Standing on One Leg: Unable to try or needs assist to prevent fall Total Score: 36 Static Sitting Balance Static Sitting - Balance Support: Feet supported;No upper extremity supported Static Sitting - Level of Assistance: 7: Independent Dynamic Sitting Balance Dynamic Sitting - Balance Support: Feet supported;No upper extremity supported Static Standing Balance Static Standing - Balance Support: Bilateral upper extremity supported;During functional activity Static Standing - Level of Assistance: 6: Modified independent (Device/Increase time) Dynamic Standing Balance Dynamic Standing - Balance Support: Bilateral upper  extremity supported;During functional activity Dynamic Standing - Level of Assistance: 5: Stand by assistance   Therapy/Group: Individual Therapy  Lealer Marsland P Anahli Arvanitis  PT, DPT, CSRS  10/20/2023, 7:42 AM

## 2023-10-20 NOTE — Plan of Care (Signed)

## 2023-10-20 NOTE — Progress Notes (Signed)
Inpatient Rehabilitation Discharge Medication Review by a Pharmacist  A complete drug regimen review was completed for this patient to identify any potential clinically significant medication issues.  High Risk Drug Classes Is patient taking? Indication by Medication  Antipsychotic No   Anticoagulant No   Antibiotic No   Opioid No   Antiplatelet No   Hypoglycemics/insulin No   Vasoactive Medication Yes Norvasc, hydralazine - HTN   Chemotherapy No   Other Yes Pantoprazole, TUMS- reflux  Alprazolam PRN- anxiety  Nicotine- tobacco dependence  Voltaren gel - pain Claritin - allergies Fluticasone ointment - rash Vit D, biotin, calcium/zinc, B12 - supplement  Advair - asthma     Type of Medication Issue Identified Description of Issue Recommendation(s)  Drug Interaction(s) (clinically significant)     Duplicate Therapy     Allergy     No Medication Administration End Date     Incorrect Dose     Additional Drug Therapy Needed     Significant med changes from prior encounter (inform family/care partners about these prior to discharge).    Other       Clinically significant medication issues were identified that warrant physician communication and completion of prescribed/recommended actions by midnight of the next day:  No  Name of provider notified for urgent issues identified:   Provider Method of Notification:   Pharmacist comments:   Time spent performing this drug regimen review (minutes):  20   Ulyses Southward, PharmD, [place], AAHIVP, CPP Infectious Disease Pharmacist 10/20/2023 8:15 AM

## 2023-10-20 NOTE — Progress Notes (Signed)
Physical Therapy Discharge Summary  Patient Details  Name: Kathy Vargas MRN: [id number] Date of Birth: March 13, 1951  Date of Discharge from PT service:October 20, 2023  Patient has met 8 of 8 long term goals due to improved activity tolerance, improved balance, improved postural control, increased strength, decreased pain, ability to compensate for deficits, functional use of  left lower extremity, improved attention, and improved awareness.  Patient to discharge at an ambulatory level Modified Independent.   Patient's care partner is independent to provide the necessary physical assistance at discharge.  Reasons goals not met: n/a  Functional Outcome Measures: BERG 36/56 TUG 27.5 seconds 5xSTS 1 minute 20 seconds    Recommendation:  Patient will benefit from ongoing skilled PT services in home health setting to continue to advance safe functional mobility, address ongoing impairments in L sided weakness, home safety, caregiver training, and minimize fall risk.  Equipment: Knee Cage, RW , toe capped shoe  Reasons for discharge: treatment goals met and discharge from hospital  Patient/family agrees with progress made and goals achieved: Yes  PT Discharge Precautions/Restrictions Precautions Precautions: Fall Precaution Comments: L hemi, L ankle sprain Required Braces or Orthoses: Splint/Cast;Other Brace Splint/Cast: L ankle ASO Other Brace: L knee cage Restrictions Weight Bearing Restrictions: No Pain Interference Pain Interference Pain Effect on Sleep: 1. Rarely or not at all Pain Interference with Therapy Activities: 4. Almost constantly Pain Interference with Day-to-Day Activities: 4. Almost constantly Vision/Perception  Vision - History Ability to See in Adequate Light: 0 Adequate Perception Perception: Within Functional Limits Praxis Praxis: WFL  Cognition Overall Cognitive Status: Within Functional Limits for tasks assessed Arousal/Alertness:  Awake/alert Orientation Level: Oriented X4 Attention: Sustained;Selective Sustained Attention: Appears intact Selective Attention: Appears intact Memory: Appears intact Awareness: Appears intact Problem Solving: Appears intact Behaviors: Poor frustration tolerance Safety/Judgment: Appears intact Sensation Sensation Light Touch: Appears Intact Hot/Cold: Appears Intact Proprioception: Appears Intact Stereognosis: Appears Intact Coordination Gross Motor Movements are Fluid and Coordinated: No Coordination and Movement Description: mild L hemi ( LE > UE) Motor  Motor Motor: Hemiplegia Motor - Discharge Observations: mild L hemi - improved since date of evaluation  Mobility Bed Mobility Bed Mobility: Supine to Sit;Sit to Supine Supine to Sit: Independent Sit to Supine: Independent Transfers Transfers: Sit to Stand;Stand to Sit;Stand Pivot Transfers Sit to Stand: Independent with assistive device Stand to Sit: Independent with assistive device Stand Pivot Transfers: Independent with assistive device Transfer (Assistive device): Rolling walker Locomotion  Gait Ambulation: Yes Gait Assistance: Independent with assistive device Gait Distance (Feet): 150 Feet Assistive device: Rolling walker Gait Gait: Yes Gait Pattern: Impaired Gait Pattern: Step-through pattern;Trunk flexed;Antalgic;Left genu recurvatum;Poor foot clearance - left Stairs / Additional Locomotion Stairs: Yes Stairs Assistance: Supervision/Verbal cueing Stair Management Technique: Two rails;Step to pattern;Forwards Number of Stairs: 12 Height of Stairs: 6 Pick up small object from the floor assist level: Contact Guard/Touching assist Wheelchair Mobility Wheelchair Mobility: No  Trunk/Postural Assessment  Cervical Assessment Cervical Assessment: Within Functional Limits Thoracic Assessment Thoracic Assessment: Within Functional Limits Lumbar Assessment Lumbar Assessment: Within Functional  Limits Postural Control Postural Control: Within Functional Limits  Balance Balance Balance Assessed: Yes Standardized Balance Assessment Standardized Balance Assessment: Berg Balance Test Berg Balance Test Sit to Stand: Able to stand without using hands and stabilize independently Standing Unsupported: Able to stand safely 2 minutes Sitting with Back Unsupported but Feet Supported on Floor or Stool: Able to sit safely and securely 2 minutes Stand to Sit: Sits safely with minimal use of hands Transfers: Able to  transfer safely, definite need of hands Standing Unsupported with Eyes Closed: Able to stand 10 seconds with supervision Standing Ubsupported with Feet Together: Able to place feet together independently and stand for 1 minute with supervision From Standing, Reach Forward with Outstretched Arm: Can reach forward >5 cm safely (2") From Standing Position, Pick up Object from Floor: Able to pick up shoe, needs supervision From Standing Position, Turn to Look Behind Over each Shoulder: Turn sideways only but maintains balance Turn 360 Degrees: Needs close supervision or verbal cueing Standing Unsupported, Alternately Place Feet on Step/Stool: Able to complete >2 steps/needs minimal assist Standing Unsupported, One Foot in Front: Able to take small step independently and hold 30 seconds Standing on One Leg: Unable to try or needs assist to prevent fall Total Score: 36 Static Sitting Balance Static Sitting - Balance Support: Feet supported;No upper extremity supported Static Sitting - Level of Assistance: 7: Independent Dynamic Sitting Balance Dynamic Sitting - Balance Support: Feet supported;No upper extremity supported Static Standing Balance Static Standing - Balance Support: Bilateral upper extremity supported;During functional activity Static Standing - Level of Assistance: 6: Modified independent (Device/Increase time) Dynamic Standing Balance Dynamic Standing - Balance  Support: Bilateral upper extremity supported;During functional activity Dynamic Standing - Level of Assistance: 5: Stand by assistance Extremity Assessment      RLE Assessment RLE Assessment: Within Functional Limits LLE Assessment LLE Assessment: Exceptions to [facility] General Strength Comments: Grossly 4/5   Reika Callanan P Myson Levi  PT, DPT, CSRS  10/20/2023, 7:52 AM

## 2023-10-23 ENCOUNTER — Telehealth: Payer: Self-pay

## 2023-10-23 DIAGNOSIS — D631 Anemia in chronic kidney disease: Secondary | ICD-10-CM | POA: Diagnosis not present

## 2023-10-23 DIAGNOSIS — I129 Hypertensive chronic kidney disease with stage 1 through stage 4 chronic kidney disease, or unspecified chronic kidney disease: Secondary | ICD-10-CM | POA: Diagnosis not present

## 2023-10-23 DIAGNOSIS — J449 Chronic obstructive pulmonary disease, unspecified: Secondary | ICD-10-CM | POA: Diagnosis not present

## 2023-10-23 DIAGNOSIS — I69154 Hemiplegia and hemiparesis following nontraumatic intracerebral hemorrhage affecting left non-dominant side: Secondary | ICD-10-CM | POA: Diagnosis not present

## 2023-10-23 DIAGNOSIS — N1832 Chronic kidney disease, stage 3b: Secondary | ICD-10-CM | POA: Diagnosis not present

## 2023-10-23 NOTE — Transitions of Care (Post Inpatient/ED Visit) (Unsigned)
   10/23/2023  Name: Kathy Vargas MRN: [id number] DOB: 1951/01/18  Today's TOC FU Call Status: Today's TOC FU Call Status:: Unsuccessful Call (1st Attempt) Unsuccessful Call (1st Attempt) Date: 10/23/23  Attempted to reach the patient regarding the most recent Inpatient/ED visit.  Follow Up Plan: Additional outreach attempts will be made to reach the patient to complete the Transitions of Care (Post Inpatient/ED visit) call.   Signature Karena Addison, LPN [facility] Nurse Health Advisor Direct Dial [phone]

## 2023-10-23 NOTE — Telephone Encounter (Signed)
Transitional Care call--patient    Are you/is patient experiencing any problems since coming home? No Are there any questions regarding any aspect of care? No Are there any questions regarding medications administration/dosing? No Are meds being taken as prescribed? Yes Patient should review meds with caller to confirm Have there been any falls? No Has Home Health been to the house and/or have they contacted you? Yes If not, have you tried to contact them? Can we help you contact them? Are bowels and bladder emptying properly? Yes, loose stools Are there any unexpected incontinence issues? No If applicable, is patient following bowel/bladder programs? Any fevers, problems with breathing, unexpected pain? NO Are there any skin problems or new areas of breakdown? Has the patient/family member arranged specialty MD follow up (ie cardiology/neurology/renal/surgical/etc)? Yes  Can we help arrange? Does the patient need any other services or support that we can help arrange? No Are caregivers following through as expected in assisting the patient? Yes Has the patient quit smoking, drinking alcohol, or using drugs as recommended? Yes  Appointment time 3:20 10/31/23, arrive time 3:00 pm Dr. Carlis Abbott [phone]

## 2023-10-24 NOTE — Transitions of Care (Post Inpatient/ED Visit) (Signed)
10/24/2023  Name: Kathy Vargas MRN: [id number] DOB: 06-15-1951  Today's TOC FU Call Status: Today's TOC FU Call Status:: Successful TOC FU Call Completed Unsuccessful Call (1st Attempt) Date: 10/23/23 [facility] FU Call Complete Date: 10/24/23 Patient's Name and Date of Birth confirmed.  Transition Care Management Follow-up Telephone Call Date of Discharge: 10/20/23 Discharge Facility: Other [facility]) Name of Other ([facility]) Discharge Facility: [facility] Type of Discharge: Inpatient Admission Primary Inpatient Discharge Diagnosis:: intracerebral hemorrhage How have you been since you were released from the hospital?: Better Any questions or concerns?: No  Items Reviewed: Did you receive and understand the discharge instructions provided?: Yes Medications obtained,verified, and reconciled?: Yes (Medications Reviewed) Any new allergies since your discharge?: No Dietary orders reviewed?: Yes Do you have support at home?: Yes People in Home: child(ren), adult, sibling(s)  Medications Reviewed Today: Medications Reviewed Today     Reviewed by Karena Addison, LPN (Licensed Practical Nurse) on 10/24/23 at 1147  Med List Status: <None>   Medication Order Taking? Sig Documenting Provider Last Dose Status Informant  acetaminophen (TYLENOL) 325 MG tablet [id number]  Take 1-2 tablets (325-650 mg total) by mouth every 4 (four) hours as needed for mild pain (pain score 1-3). Setzer, Lynnell Jude, PA-C  Active   ALPRAZolam Prudy Feeler) 0.5 MG tablet [id number] No Take 1 tablet (0.5 mg) by mouth 2 times daily as needed. Corwin Levins, MD 10/09/2023 Active Self  amLODipine (NORVASC) 5 MG tablet [id number]  Take 1 tablet (5 mg total) by mouth daily. Setzer, Lynnell Jude, PA-C  Active   Biotin w/ Vitamins C & E (HAIR/SKIN/NAILS PO) [id number] No Take 1 tablet by mouth daily. [provider] 10/10/2023 Active Self  Calcium-Magnesium-Zinc (CAL-MAG-ZINC PO) [id number] No Take 1 tablet by mouth at  bedtime. [provider] Past Month Active Self  Cholecalciferol (VITAMIN D3 PO) [id number] No Take 1 tablet by mouth in the morning. [provider] 10/10/2023 Active Self  cyanocobalamin (VITAMIN B12) 1000 MCG tablet [id number] No Take 1 tablet (1,000 mcg total) by mouth daily. Corwin Levins, MD 10/09/2023 Active Self  diclofenac Sodium (VOLTAREN) 1 % GEL [id number]  Apply 2 g topically 4 (four) times daily. Milinda Antis, PA-C  Active   Discontinued 02/09/12 1526   fluticasone (CUTIVATE) 0.005 % ointment [id number] No Apply to the skin 2 times daily if needed  Taking Active Self  fluticasone-salmeterol (ADVAIR DISKUS) 250-50 MCG/ACT AEPB [id number] No Inhale 1 puff into the lungs 2 (two) times daily as needed (Asthma). Corwin Levins, MD Past Week Active Self  hydrALAZINE (APRESOLINE) 100 MG tablet [id number]  Take 1 tablet (100 mg total) by mouth 3 (three) times daily. Setzer, Lynnell Jude, PA-C  Active   loratadine (CLARITIN) 10 MG tablet [id number]  Take 1 tablet (10 mg total) by mouth daily. Setzer, Lynnell Jude, PA-C  Active   nicotine (NICODERM CQ - DOSED IN MG/24 HOURS) 21 mg/24hr patch [id number]  Place 1 patch (21 mg total) onto the skin daily. Setzer, Lynnell Jude, PA-C  Active   pantoprazole (PROTONIX) 40 MG tablet [id number] No Take 1 tablet (40 mg total) by mouth daily. Corwin Levins, MD Past Week Active Self  senna-docusate (SENOKOT-S) 8.6-50 MG tablet [id number]  Take 2 tablets by mouth 2 (two) times daily. Setzer, Lynnell Jude, PA-C  Active             Home Care and Equipment/Supplies: Were Home Health Services Ordered?: Yes Name of Home Health Agency:: [facility] Has Agency set up a  time to come to your home?: Yes First Home Health Visit Date: 10/23/23 Any new equipment or medical supplies ordered?: NA  Functional Questionnaire: Do you need assistance with bathing/showering or dressing?: No Do you need assistance with meal preparation?: No Do you need assistance with eating?:  No Do you have difficulty maintaining continence: No Do you need assistance with getting out of bed/getting out of a chair/moving?: No Do you have difficulty managing or taking your medications?: No  Follow up appointments reviewed: PCP Follow-up appointment confirmed?: Yes Date of PCP follow-up appointment?: 11/03/23 Follow-up Provider: [facility] Follow-up appointment confirmed?: NA Do you need transportation to your follow-up appointment?: No Do you understand care options if your condition(s) worsen?: Yes-patient verbalized understanding    SIGNATURE Karena Addison, LPN [facility] Nurse Health Advisor Direct Dial [phone]

## 2023-10-27 ENCOUNTER — Ambulatory Visit: Payer: Medicare Other | Admitting: Internal Medicine

## 2023-10-27 ENCOUNTER — Telehealth: Payer: Self-pay | Admitting: Internal Medicine

## 2023-10-27 NOTE — Telephone Encounter (Signed)
Called and left voice mail

## 2023-10-27 NOTE — Telephone Encounter (Signed)
Kathy Vargas stated that she is requesting for order for Physical Therapy. Once a week for 1 week, 2x for 2 weeks and 1x for 2 weeks. Please call to be sure the orders requested is accurate Kathy Vargas CB# is [phone] [facility]

## 2023-10-27 NOTE — Telephone Encounter (Signed)
Ok for verbal 

## 2023-10-31 ENCOUNTER — Encounter: Payer: Self-pay | Admitting: Physical Medicine and Rehabilitation

## 2023-10-31 ENCOUNTER — Encounter
Payer: Medicare Other | Attending: Physical Medicine and Rehabilitation | Admitting: Physical Medicine and Rehabilitation

## 2023-10-31 VITALS — BP 135/81 | HR 92 | Ht 64.0 in | Wt 123.0 lb

## 2023-10-31 DIAGNOSIS — I610 Nontraumatic intracerebral hemorrhage in hemisphere, subcortical: Secondary | ICD-10-CM | POA: Insufficient documentation

## 2023-10-31 DIAGNOSIS — S93492D Sprain of other ligament of left ankle, subsequent encounter: Secondary | ICD-10-CM | POA: Diagnosis not present

## 2023-10-31 NOTE — Progress Notes (Signed)
Subjective:    Patient ID: Kathy Vargas, female    DOB: [birth year], 72 y.o.   MRN: [id number]  HPI [name] is a 72 year female who presents for TC visit after ICH  1) Left foot swelling: -present more when her shoes are on -she does elevate her leg- sleeps in a recliner  2) Pain  -present in left foot  3) ICH: -therapy will start coming tomorrow  Pain Inventory Average Pain 0 Pain Right Now 0 My pain is  NO PAIN  LOCATION OF PAIN  Left ankle swelling, left tingling   BOWEL Number of stools per week: 4 Oral laxative use No  Type of laxative none Enema or suppository use No  History of colostomy No  Incontinent No   BLADDER Normal  Leakage with coughing Yes     Mobility walk with assistance use a walker ability to climb steps?  yes do you drive?  no Do you have any goals in this area?  yes  Function retired on STD   Neuro/Psych weakness trouble walking depression anxiety  Prior Studies Any changes since last visit?  no  Physicians involved in your care Any changes since last visit?  no   Family History  Problem Relation Age of Onset   Alcohol abuse Other    Anxiety disorder Other    Coronary artery disease Other    Depression Other    Stroke Other    Social History   Socioeconomic History   Marital status: Divorced    Spouse name: Not on file   Number of children: Not on file   Years of education: Not on file   Highest education level: Not on file  Occupational History   Not on file  Tobacco Use   Smoking status: Every Day    Current packs/day: 0.50    Average packs/day: 0.5 packs/day for 60.9 years (30.5 ttl pk-yrs)    Types: Cigarettes    Start date: 1964   Smokeless tobacco: Never  Vaping Use   Vaping status: Never Used  Substance and Sexual Activity   Alcohol use: Not Currently   Drug use: No   Sexual activity: Not on file  Other Topics Concern   Not on file  Social History Narrative   Not on file   Social  Determinants of Health   Financial Resource Strain: Low Risk  (10/05/2022)   Overall Financial Resource Strain (CARDIA)    Difficulty of Paying Living Expenses: Not hard at all  Food Insecurity: No Food Insecurity (10/10/2023)   Hunger Vital Sign    Worried About Running Out of Food in the Last Year: Never true    Ran Out of Food in the Last Year: Never true  Transportation Needs: No Transportation Needs (10/10/2023)   PRAPARE - Administrator, Civil Service (Medical): No    Lack of Transportation (Non-Medical): No  Physical Activity: Sufficiently Active (10/05/2022)   Exercise Vital Sign    Days of Exercise per Week: 7 days    Minutes of Exercise per Session: 30 min  Stress: No Stress Concern Present (10/05/2022)   [employer] of Occupational Health - Occupational Stress Questionnaire    Feeling of Stress : Not at all  Social Connections: Socially Isolated (10/05/2022)   Social Connection and Isolation Panel [NHANES]    Frequency of Communication with Friends and Family: More than three times a week    Frequency of Social Gatherings with Friends and Family: Three times a  week    Attends Religious Services: Never    Active Member of Clubs or Organizations: No    Attends Engineer, structural: Never    Marital Status: Never married   Past Surgical History:  Procedure Laterality Date   ABDOMINAL HYSTERECTOMY     CATARACT EXTRACTION W/ INTRAOCULAR LENS IMPLANT Bilateral    CHOLECYSTECTOMY     CYSTOSCOPY/URETEROSCOPY/HOLMIUM LASER/STENT PLACEMENT Left 07/01/2022   Procedure: CYSTOSCOPY/ RETROGRADE/URETEROSCOPY/STENT PLACEMENT;  Surgeon: Jannifer Hick, MD;  Location: [facility];  Service: Urology;  Laterality: Left;   eyebrow lift     IR URETERAL STENT LEFT NEW ACCESS W/O SEP NEPHROSTOMY CATH  07/01/2022   LIPOSUCTION     LUMBAR DISC SURGERY     L4, L5   NEPHROLITHOTOMY Left 07/01/2022   Procedure: NEPHROLITHOTOMY PERCUTANEOUS/ INTERVENTIONAL RADIOLOGY TO PLACE  LEFT NEPHROSTOMY TUBE PRIOR;  Surgeon: Jannifer Hick, MD;  Location: [facility];  Service: Urology;  Laterality: Left;  ONLY NEEDS 120 MIN FOR ALL PROCEDURES   URETEROSCOPY WITH HOLMIUM LASER LITHOTRIPSY Right 04/12/2022   Procedure: URETEROSCOPY WITH HOLMIUM LASER LITHOTRIPSY, STENT PLACEMENT;  Surgeon: Despina Arias, MD;  Location: [facility];  Service: Urology;  Laterality: Right;   Past Medical History:  Diagnosis Date   ABDOMINAL TENDERNESS 01/21/2011   ACUTE GINGIVITIS NONPLAQUE INDUCED 06/11/2010   ADD 09/05/2008   Alcohol abuse, in remission 09/27/2007   ALLERGIC RHINITIS 03/02/2009   Anemia    ANXIETY 07/02/2007   BACK PAIN 05/19/2009   CHRONIC OBSTRUCTIVE PULMONARY DISEASE, ACUTE EXACERBATION 08/06/2009   COPD 07/02/2007   DEGENERATIVE JOINT DISEASE, CERVICAL SPINE 07/02/2007   DEPRESSION 07/02/2007   Dyspnea    GASTROENTERITIS, VIRAL 11/29/2010   GERD 01/21/2011   Headache(784.0) 07/02/2007   History of kidney stones    Hypertension    HYPOTHYROIDISM 07/02/2007   Lumbar disc disease    Lumbar pain with radiation down right leg 06/01/2011   OSTEOARTHRITIS 07/02/2007   Other and unspecified ovarian cyst 05/19/2009   PAIN, CHRONIC, DUE TO TRAUMA 07/02/2007   PELVIC  PAIN 03/29/2010   UTI 05/19/2009   Vitamin D insufficiency    There were no vitals taken for this visit.  Opioid Risk Score:   Fall Risk Score:  `1  Depression screen PHQ 2/9     04/14/2023    2:50 PM 10/10/2022    3:09 PM 10/05/2022    3:41 PM 06/30/2022    4:10 PM 01/07/2022    1:09 PM 09/16/2021    3:56 PM 02/11/2021    3:55 PM  Depression screen PHQ 2/9  Decreased Interest 0 0 0 0 0 0 0  Down, Depressed, Hopeless 0 0 3 3 1  0 0  PHQ - 2 Score 0 0 3 3 1  0 0  Altered sleeping  0 0 0     Tired, decreased energy  0 0 0     Change in appetite  0 0 0     Feeling bad or failure about yourself   0 0 0     Trouble concentrating  0 0 0     Moving slowly or fidgety/restless  0 0 0     Suicidal thoughts   0 0 0     PHQ-9 Score  0 3 3     Difficult doing work/chores  Not difficult at all Not difficult at all        Review of Systems  Respiratory:  Positive for shortness of breath.   Cardiovascular:  Positive for leg swelling.       Left ankle  Gastrointestinal:  Positive for constipation.  Musculoskeletal:  Positive for gait problem.  Neurological:  Positive for weakness.  Hematological:  Bruises/bleeds easily.  Psychiatric/Behavioral:         Depression, Anxiety  All other systems reviewed and are negative.      Objective:   Physical Exam Gen: no distress, normal appearing HEENT: oral mucosa pink and moist, NCAT Cardio: Reg rate Chest: normal effort, normal rate of breathing Abd: soft, non-distended Ext: no edema Psych: pleasant, normal affect Skin: intact Neuro: Alert and oriented x3 MSK: Left ankle swelling       Assessment & Plan:   1) ICH -provided with handicap placard -continue therapy -discussed prognosis  2) Left ankle swelling 2/2 sprain: -recommended ankle compression sleeve -recommended ankle elevation  3) HTN: -discussed that BP is well controlled -continue amlodipine and hydralazine  Current impairments: impaired ambulation requiring assistive device  Patient will require OT and PT; orders have been placed  The patient's medical and/or psychosocial problems require moderate decision-making during transitions in care from inpatient rehabilitation to home. This transitional care appointment included review of the patient's hospital discharge summary, review of the patient's hospital diagnostic tests and discussion of appropriate follow-up, education of the patient regarding their condition, re-establishment of necessary referrals. I will be reviewing patient's home and/or outpatient therapy notes as they progress through therapy and corresponding with therapists accordingly. I have encouraged compliance with current medication regimen (with adjustment  to regimen as needed), follow-up with necessary providers, and the importance of following a healthy diet and exercise routine to maximize recovery, health, and quality of life.

## 2023-11-01 DIAGNOSIS — J449 Chronic obstructive pulmonary disease, unspecified: Secondary | ICD-10-CM | POA: Diagnosis not present

## 2023-11-01 DIAGNOSIS — I129 Hypertensive chronic kidney disease with stage 1 through stage 4 chronic kidney disease, or unspecified chronic kidney disease: Secondary | ICD-10-CM | POA: Diagnosis not present

## 2023-11-01 DIAGNOSIS — D631 Anemia in chronic kidney disease: Secondary | ICD-10-CM | POA: Diagnosis not present

## 2023-11-01 DIAGNOSIS — I69154 Hemiplegia and hemiparesis following nontraumatic intracerebral hemorrhage affecting left non-dominant side: Secondary | ICD-10-CM | POA: Diagnosis not present

## 2023-11-01 DIAGNOSIS — N1832 Chronic kidney disease, stage 3b: Secondary | ICD-10-CM | POA: Diagnosis not present

## 2023-11-03 ENCOUNTER — Other Ambulatory Visit (HOSPITAL_COMMUNITY): Payer: Self-pay

## 2023-11-03 ENCOUNTER — Ambulatory Visit (INDEPENDENT_AMBULATORY_CARE_PROVIDER_SITE_OTHER): Payer: Medicare Other | Admitting: Internal Medicine

## 2023-11-03 ENCOUNTER — Other Ambulatory Visit: Payer: Self-pay

## 2023-11-03 ENCOUNTER — Encounter: Payer: Self-pay | Admitting: Internal Medicine

## 2023-11-03 VITALS — BP 122/80 | HR 85 | Temp 98.1°F | Ht 64.0 in | Wt 121.0 lb

## 2023-11-03 DIAGNOSIS — I610 Nontraumatic intracerebral hemorrhage in hemisphere, subcortical: Secondary | ICD-10-CM | POA: Diagnosis not present

## 2023-11-03 DIAGNOSIS — F988 Other specified behavioral and emotional disorders with onset usually occurring in childhood and adolescence: Secondary | ICD-10-CM

## 2023-11-03 DIAGNOSIS — F17200 Nicotine dependence, unspecified, uncomplicated: Secondary | ICD-10-CM

## 2023-11-03 DIAGNOSIS — N390 Urinary tract infection, site not specified: Secondary | ICD-10-CM

## 2023-11-03 DIAGNOSIS — I10 Essential (primary) hypertension: Secondary | ICD-10-CM | POA: Diagnosis not present

## 2023-11-03 MED ORDER — AMLODIPINE BESYLATE 5 MG PO TABS
5.0000 mg | ORAL_TABLET | Freq: Every day | ORAL | 3 refills | Status: DC
  Filled 2023-11-03 – 2023-11-18 (×2): qty 90, 90d supply, fill #0
  Filled 2024-03-27: qty 90, 90d supply, fill #1

## 2023-11-03 MED ORDER — ALPRAZOLAM 0.5 MG PO TABS
0.5000 mg | ORAL_TABLET | Freq: Two times a day (BID) | ORAL | 2 refills | Status: DC | PRN
  Filled 2023-11-03 – 2023-11-04 (×2): qty 60, 30d supply, fill #0
  Filled 2023-12-07: qty 60, 30d supply, fill #1
  Filled 2024-01-11: qty 60, 30d supply, fill #2

## 2023-11-03 MED ORDER — AMPHETAMINE-DEXTROAMPHETAMINE 30 MG PO TABS
30.0000 mg | ORAL_TABLET | Freq: Every day | ORAL | 0 refills | Status: DC
  Filled 2023-11-03 – 2023-11-04 (×2): qty 60, 60d supply, fill #0

## 2023-11-03 MED ORDER — HYDRALAZINE HCL 100 MG PO TABS
100.0000 mg | ORAL_TABLET | Freq: Three times a day (TID) | ORAL | 11 refills | Status: DC
  Filled 2023-11-03 – 2023-11-18 (×2): qty 90, 30d supply, fill #0
  Filled 2024-01-11: qty 90, 30d supply, fill #1
  Filled 2024-06-17: qty 90, 30d supply, fill #2

## 2023-11-03 NOTE — Progress Notes (Unsigned)
Patient ID: Kathy Vargas, female   DOB: Jan 16, 1951, 72 y.o.   MRN: [id number]        Chief Complaint: follow up post hospn nov 21 - 30 with ICH basal ganglia, left hand skin tear, left ankle sprin, htn, smoker, Aki       HPI:  Kathy Vargas is a 72 y.o. female here with c/o        Wt Readings from Last 3 Encounters:  11/03/23 121 lb (54.9 kg)  10/31/23 123 lb (55.8 kg)  10/12/23 121 lb 4.1 oz (55 kg)   BP Readings from Last 3 Encounters:  11/03/23 122/80  10/31/23 135/81  10/20/23 139/70         Past Medical History:  Diagnosis Date   ABDOMINAL TENDERNESS 01/21/2011   ACUTE GINGIVITIS NONPLAQUE INDUCED 06/11/2010   ADD 09/05/2008   Alcohol abuse, in remission 09/27/2007   ALLERGIC RHINITIS 03/02/2009   Anemia    ANXIETY 07/02/2007   BACK PAIN 05/19/2009   CHRONIC OBSTRUCTIVE PULMONARY DISEASE, ACUTE EXACERBATION 08/06/2009   COPD 07/02/2007   DEGENERATIVE JOINT DISEASE, CERVICAL SPINE 07/02/2007   DEPRESSION 07/02/2007   Dyspnea    GASTROENTERITIS, VIRAL 11/29/2010   GERD 01/21/2011   Headache(784.0) 07/02/2007   History of kidney stones    Hypertension    HYPOTHYROIDISM 07/02/2007   Lumbar disc disease    Lumbar pain with radiation down right leg 06/01/2011   OSTEOARTHRITIS 07/02/2007   Other and unspecified ovarian cyst 05/19/2009   PAIN, CHRONIC, DUE TO TRAUMA 07/02/2007   PELVIC  PAIN 03/29/2010   UTI 05/19/2009   Vitamin D insufficiency    Past Surgical History:  Procedure Laterality Date   ABDOMINAL HYSTERECTOMY     CATARACT EXTRACTION W/ INTRAOCULAR LENS IMPLANT Bilateral    CHOLECYSTECTOMY     CYSTOSCOPY/URETEROSCOPY/HOLMIUM LASER/STENT PLACEMENT Left 07/01/2022   Procedure: CYSTOSCOPY/ RETROGRADE/URETEROSCOPY/STENT PLACEMENT;  Surgeon: Jannifer Hick, MD;  Location: [facility];  Service: Urology;  Laterality: Left;   eyebrow lift     IR URETERAL STENT LEFT NEW ACCESS W/O SEP NEPHROSTOMY CATH  07/01/2022   LIPOSUCTION     LUMBAR DISC SURGERY     L4, L5    NEPHROLITHOTOMY Left 07/01/2022   Procedure: NEPHROLITHOTOMY PERCUTANEOUS/ INTERVENTIONAL RADIOLOGY TO PLACE LEFT NEPHROSTOMY TUBE PRIOR;  Surgeon: Jannifer Hick, MD;  Location: [facility];  Service: Urology;  Laterality: Left;  ONLY NEEDS 120 MIN FOR ALL PROCEDURES   URETEROSCOPY WITH HOLMIUM LASER LITHOTRIPSY Right 04/12/2022   Procedure: URETEROSCOPY WITH HOLMIUM LASER LITHOTRIPSY, STENT PLACEMENT;  Surgeon: Despina Arias, MD;  Location: [facility];  Service: Urology;  Laterality: Right;    reports that she has quit smoking. Her smoking use included cigarettes. She started smoking about 60 years ago. She has a 30.5 pack-year smoking history. She has never used smokeless tobacco. She reports that she does not currently use alcohol. She reports that she does not use drugs. family history includes Alcohol abuse in an other family member; Anxiety disorder in an other family member; Coronary artery disease in an other family member; Depression in an other family member; Stroke in an other family member. Allergies  Allergen Reactions   Morphine Nausea And Vomiting   Ceftin [Cefuroxime]     Nausea and vomiting   Current Outpatient Medications on File Prior to Visit  Medication Sig Dispense Refill   ALPRAZolam (XANAX) 0.5 MG tablet Take 1 tablet (0.5 mg) by mouth 2 times daily as needed. 60 tablet 5   amLODipine (  NORVASC) 5 MG tablet Take 1 tablet (5 mg total) by mouth daily. 30 tablet 0   Cholecalciferol (VITAMIN D3 PO) Take 1 tablet by mouth in the morning.     diclofenac Sodium (VOLTAREN) 1 % GEL Apply 2 g topically 4 (four) times daily. 100 g 0   fluticasone (CUTIVATE) 0.005 % ointment Apply to the skin 2 times daily if needed 30 g 1   hydrALAZINE (APRESOLINE) 100 MG tablet Take 1 tablet (100 mg total) by mouth 3 (three) times daily. 90 tablet 0   nicotine (NICODERM CQ - DOSED IN MG/24 HOURS) 21 mg/24hr patch Place 1 patch (21 mg total) onto the skin daily. 14 patch 0   acetaminophen (TYLENOL) 325  MG tablet Take 1-2 tablets (325-650 mg total) by mouth every 4 (four) hours as needed for mild pain (pain score 1-3). (Patient not taking: Reported on 11/03/2023)     Biotin w/ Vitamins C & E (HAIR/SKIN/NAILS PO) Take 1 tablet by mouth daily. (Patient not taking: Reported on 10/31/2023)     Calcium-Magnesium-Zinc (CAL-MAG-ZINC PO) Take 1 tablet by mouth at bedtime. (Patient not taking: Reported on 10/31/2023)     cyanocobalamin (VITAMIN B12) 1000 MCG tablet Take 1 tablet (1,000 mcg total) by mouth daily. (Patient not taking: Reported on 10/31/2023) 90 tablet 3   fluticasone-salmeterol (ADVAIR DISKUS) 250-50 MCG/ACT AEPB Inhale 1 puff into the lungs 2 (two) times daily as needed (Asthma). (Patient not taking: Reported on 10/31/2023) 180 each 3   loratadine (CLARITIN) 10 MG tablet Take 1 tablet (10 mg total) by mouth daily. (Patient not taking: Reported on 11/03/2023)     pantoprazole (PROTONIX) 40 MG tablet Take 1 tablet (40 mg total) by mouth daily. (Patient not taking: Reported on 10/31/2023) 90 tablet 3   senna-docusate (SENOKOT-S) 8.6-50 MG tablet Take 2 tablets by mouth 2 (two) times daily. (Patient not taking: Reported on 11/03/2023)     [DISCONTINUED] FLUoxetine (PROZAC) 20 MG capsule Take 1 capsule (20 mg total) by mouth daily. 30 capsule 11   No current facility-administered medications on file prior to visit.        ROS:  All others reviewed and negative.  Objective        PE:  BP 122/80 (BP Location: Right Arm, Patient Position: Sitting, Cuff Size: Normal)   Pulse 85   Temp 98.1 F (36.7 C) (Oral)   Ht 5\' 4"  (1.626 m)   Wt 121 lb (54.9 kg)   SpO2 99%   BMI 20.77 kg/m                 Constitutional: Pt appears in NAD               HENT: Head: NCAT.                Right Ear: External ear normal.                 Left Ear: External ear normal.                Eyes: . Pupils are equal, round, and reactive to light. Conjunctivae and EOM are normal               Nose: without d/c or  deformity               Neck: Neck supple. Gross normal ROM               Cardiovascular: Normal rate and regular rhythm.  Pulmonary/Chest: Effort normal and breath sounds without rales or wheezing.                Abd:  Soft, NT, ND, + BS, no organomegaly               Neurological: Pt is alert. At baseline orientation, motor grossly intact               Skin: Skin is warm. No rashes, no other new lesions, LE edema - ***               Psychiatric: Pt behavior is normal without agitation   Micro: none  Cardiac tracings I have personally interpreted today:  none  Pertinent Radiological findings (summarize): none   Lab Results  Component Value Date   WBC 5.6 10/16/2023   HGB 12.3 10/16/2023   HCT 39.1 10/16/2023   PLT 295 10/16/2023   GLUCOSE 117 (H) 10/20/2023   CHOL 225 (H) 04/21/2023   TRIG 126.0 04/21/2023   HDL 75.80 04/21/2023   LDLDIRECT 121.4 12/02/2010   LDLCALC 124 (H) 04/21/2023   ALT 69 (H) 10/13/2023   AST 32 10/13/2023   NA 134 (L) 10/20/2023   K 3.9 10/20/2023   CL 103 10/20/2023   CREATININE 1.87 (H) 10/20/2023   BUN 30 (H) 10/20/2023   CO2 21 (L) 10/20/2023   TSH 1.14 04/21/2023   INR 0.9 10/10/2023   HGBA1C 6.0 04/21/2023   Assessment/Plan:  Adiella Laflash is a 73 y.o. White or Caucasian [1] female with  has a past medical history of ABDOMINAL TENDERNESS (01/21/2011), ACUTE GINGIVITIS NONPLAQUE INDUCED (06/11/2010), ADD (09/05/2008), Alcohol abuse, in remission (09/27/2007), ALLERGIC RHINITIS (03/02/2009), Anemia, ANXIETY (07/02/2007), BACK PAIN (05/19/2009), CHRONIC OBSTRUCTIVE PULMONARY DISEASE, ACUTE EXACERBATION (08/06/2009), COPD (07/02/2007), DEGENERATIVE JOINT DISEASE, CERVICAL SPINE (07/02/2007), DEPRESSION (07/02/2007), Dyspnea, GASTROENTERITIS, VIRAL (11/29/2010), GERD (01/21/2011), Headache(784.0) (07/02/2007), History of kidney stones, Hypertension, HYPOTHYROIDISM (07/02/2007), Lumbar disc disease, Lumbar pain with radiation down  right leg (06/01/2011), OSTEOARTHRITIS (07/02/2007), Other and unspecified ovarian cyst (05/19/2009), PAIN, CHRONIC, DUE TO TRAUMA (07/02/2007), PELVIC  PAIN (03/29/2010), UTI (05/19/2009), and Vitamin D insufficiency.  No problem-specific Assessment & Plan notes found for this encounter.  Followup: No follow-ups on file.  Oliver Barre, MD 11/03/2023 11:12 AM [facility] [place] [facility] - [facility] Internal Medicine

## 2023-11-03 NOTE — Patient Instructions (Addendum)
Ok to restart the xanax and adderall as you have in the past  We should continue to stay off the irbesartan for now  Congratulations on quitting smoking !  You may wish to work only HALF time for 1 week with going back to work  Please continue all other medications as before, and refills have been done if requested.  Please have the pharmacy call with any other refills you may need.  Please continue your efforts at being more active, low cholesterol diet, and weight control.  Please keep your appointments with your specialists as you may have planned  Please make an Appointment to return in 3 months, or sooner if needed

## 2023-11-04 ENCOUNTER — Other Ambulatory Visit (HOSPITAL_COMMUNITY): Payer: Self-pay

## 2023-11-05 ENCOUNTER — Encounter: Payer: Self-pay | Admitting: Internal Medicine

## 2023-11-05 NOTE — Assessment & Plan Note (Signed)
With recent uti - now clinically resolved

## 2023-11-05 NOTE — Assessment & Plan Note (Signed)
Bp stays low normal, to stay off irbesartan for now, cont to monitor bp at home and next visti

## 2023-11-05 NOTE — Assessment & Plan Note (Signed)
Stable overall, back to driving no, plans to go back to work soon, cont PT and neurology f/u as planned

## 2023-11-05 NOTE — Assessment & Plan Note (Signed)
Stable, for adderall 20 bid refill

## 2023-11-05 NOTE — Assessment & Plan Note (Signed)
Quit since ICH - pt counsled to continue abstain

## 2023-11-06 ENCOUNTER — Other Ambulatory Visit (HOSPITAL_COMMUNITY): Payer: Self-pay

## 2023-11-18 ENCOUNTER — Other Ambulatory Visit (HOSPITAL_COMMUNITY): Payer: Self-pay

## 2023-11-21 ENCOUNTER — Inpatient Hospital Stay: Payer: Self-pay | Admitting: Adult Health

## 2023-11-21 ENCOUNTER — Telehealth: Payer: Self-pay | Admitting: Adult Health

## 2023-11-21 NOTE — Telephone Encounter (Signed)
Pt called to r/s canceled appt

## 2023-11-30 ENCOUNTER — Ambulatory Visit (INDEPENDENT_AMBULATORY_CARE_PROVIDER_SITE_OTHER): Payer: Medicare Other | Admitting: Internal Medicine

## 2023-11-30 ENCOUNTER — Encounter: Payer: Self-pay | Admitting: Internal Medicine

## 2023-11-30 VITALS — BP 128/84 | HR 100 | Temp 97.7°F | Ht 64.0 in | Wt 122.0 lb

## 2023-11-30 DIAGNOSIS — J438 Other emphysema: Secondary | ICD-10-CM

## 2023-11-30 DIAGNOSIS — I10 Essential (primary) hypertension: Secondary | ICD-10-CM

## 2023-11-30 DIAGNOSIS — F17200 Nicotine dependence, unspecified, uncomplicated: Secondary | ICD-10-CM

## 2023-11-30 DIAGNOSIS — I610 Nontraumatic intracerebral hemorrhage in hemisphere, subcortical: Secondary | ICD-10-CM | POA: Diagnosis not present

## 2023-11-30 DIAGNOSIS — E559 Vitamin D deficiency, unspecified: Secondary | ICD-10-CM | POA: Diagnosis not present

## 2023-11-30 DIAGNOSIS — J309 Allergic rhinitis, unspecified: Secondary | ICD-10-CM | POA: Diagnosis not present

## 2023-11-30 MED ORDER — METHYLPREDNISOLONE ACETATE 80 MG/ML IJ SUSP
80.0000 mg | Freq: Once | INTRAMUSCULAR | Status: AC
  Administered 2023-11-30: 80 mg via INTRAMUSCULAR

## 2023-11-30 NOTE — Progress Notes (Signed)
 Patient ID: Kathy Vargas, female   DOB: 09-27-1951, 73 y.o.   MRN: [id number]        Chief Complaint: follow up ICH follow up, allergies, former smoker, copd, htn       HPI:  Kathy Vargas is a 73 y.o. female here to follow up hx of ICH still with small left facial droop o/w doing well, and has neurology f/u soon; pt asking for work note for off to mar 10.  Still has not gotten back to smoking, completely quite. 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  Does have several wks ongoing nasal allergy symptoms with clearish congestion, itch and sneezing, without fever, pain, ST, cough, swelling or wheezing.  Pt denies chest pain, increased sob or doe, wheezing, orthopnea, PND, increased LE swelling, palpitations, dizziness or syncope.   Pt denies polydipsia, polyuria, or new focal neuro s/s.         Wt Readings from Last 3 Encounters:  11/30/23 122 lb (55.3 kg)  11/03/23 121 lb (54.9 kg)  10/31/23 123 lb (55.8 kg)   BP Readings from Last 3 Encounters:  11/30/23 128/84  11/03/23 122/80  10/31/23 135/81         Past Medical History:  Diagnosis Date   ABDOMINAL TENDERNESS 01/21/2011   ACUTE GINGIVITIS NONPLAQUE INDUCED 06/11/2010   ADD 09/05/2008   Alcohol abuse, in remission 09/27/2007   ALLERGIC RHINITIS 03/02/2009   Anemia    ANXIETY 07/02/2007   BACK PAIN 05/19/2009   CHRONIC OBSTRUCTIVE PULMONARY DISEASE, ACUTE EXACERBATION 08/06/2009   COPD 07/02/2007   DEGENERATIVE JOINT DISEASE, CERVICAL SPINE 07/02/2007   DEPRESSION 07/02/2007   Dyspnea    GASTROENTERITIS, VIRAL 11/29/2010   GERD 01/21/2011   Headache(784.0) 07/02/2007   History of kidney stones    Hypertension    HYPOTHYROIDISM 07/02/2007   Lumbar disc disease    Lumbar pain with radiation down right leg 06/01/2011   OSTEOARTHRITIS 07/02/2007    Other and unspecified ovarian cyst 05/19/2009   PAIN, CHRONIC, DUE TO TRAUMA 07/02/2007   PELVIC  PAIN 03/29/2010   UTI 05/19/2009   Vitamin D  insufficiency    Past Surgical History:  Procedure Laterality Date   ABDOMINAL HYSTERECTOMY     CATARACT EXTRACTION W/ INTRAOCULAR LENS IMPLANT Bilateral    CHOLECYSTECTOMY     CYSTOSCOPY/URETEROSCOPY/HOLMIUM LASER/STENT PLACEMENT Left 07/01/2022   Procedure: CYSTOSCOPY/ RETROGRADE/URETEROSCOPY/STENT PLACEMENT;  Surgeon: Selma Donnice SAUNDERS, MD;  Location: [facility];  Service: Urology;  Laterality: Left;   eyebrow lift     IR URETERAL STENT LEFT NEW ACCESS W/O SEP NEPHROSTOMY CATH  07/01/2022   LIPOSUCTION     LUMBAR DISC SURGERY     L4, L5   NEPHROLITHOTOMY Left 07/01/2022   Procedure: NEPHROLITHOTOMY PERCUTANEOUS/ INTERVENTIONAL RADIOLOGY TO PLACE LEFT NEPHROSTOMY TUBE PRIOR;  Surgeon: Selma Donnice SAUNDERS, MD;  Location: [facility];  Service: Urology;  Laterality: Left;  ONLY NEEDS 120 MIN FOR ALL PROCEDURES   URETEROSCOPY WITH HOLMIUM LASER LITHOTRIPSY Right 04/12/2022   Procedure: URETEROSCOPY WITH HOLMIUM LASER LITHOTRIPSY, STENT PLACEMENT;  Surgeon: Lovie Arlyss CROME, MD;  Location: [facility];  Service: Urology;  Laterality: Right;    reports that she has quit smoking. Her smoking use included cigarettes. She started smoking about 61 years ago. She has a 30.5 pack-year smoking history. She has never used smokeless tobacco. She reports  that she does not currently use alcohol. She reports that she does not use drugs. family history includes Alcohol abuse in an other family member; Anxiety disorder in an other family member; Coronary artery disease in an other family member; Depression in an other family member; Stroke in an other family member. Allergies  Allergen Reactions   Morphine Nausea And Vomiting   Ceftin  [Cefuroxime ]     Nausea and vomiting   Current Outpatient Medications on File Prior to Visit  Medication Sig Dispense Refill   ALPRAZolam  (XANAX ) 0.5 MG  tablet Take 1 tablet (0.5 mg) by mouth 2 times daily as needed. 60 tablet 2   amLODipine  (NORVASC ) 5 MG tablet Take 1 tablet (5 mg total) by mouth daily. 90 tablet 3   amphetamine -dextroamphetamine  (ADDERALL ) 30 MG tablet Take 1 tablet by mouth daily. 60 tablet 0   Cholecalciferol (VITAMIN D3 PO) Take 1 tablet by mouth in the morning.     diclofenac  Sodium (VOLTAREN ) 1 % GEL Apply 2 g topically 4 (four) times daily. 100 g 0   fluticasone  (CUTIVATE ) 0.005 % ointment Apply to the skin 2 times daily if needed 30 g 1   hydrALAZINE  (APRESOLINE ) 100 MG tablet Take 1 tablet (100 mg total) by mouth 3 (three) times daily. 90 tablet 11   nicotine  (NICODERM CQ  - DOSED IN MG/24 HOURS) 21 mg/24hr patch Place 1 patch (21 mg total) onto the skin daily. 14 patch 0   acetaminophen  (TYLENOL ) 325 MG tablet Take 1-2 tablets (325-650 mg total) by mouth every 4 (four) hours as needed for mild pain (pain score 1-3). (Patient not taking: Reported on 10/31/2023)     Biotin w/ Vitamins C & E (HAIR/SKIN/NAILS PO) Take 1 tablet by mouth daily. (Patient not taking: Reported on 10/31/2023)     Calcium -Magnesium -Zinc (CAL-MAG-ZINC PO) Take 1 tablet by mouth at bedtime. (Patient not taking: Reported on 10/31/2023)     cyanocobalamin  (VITAMIN B12) 1000 MCG tablet Take 1 tablet (1,000 mcg total) by mouth daily. (Patient not taking: Reported on 10/31/2023) 90 tablet 3   fluticasone -salmeterol (ADVAIR  DISKUS) 250-50 MCG/ACT AEPB Inhale 1 puff into the lungs 2 (two) times daily as needed (Asthma). (Patient not taking: Reported on 10/31/2023) 180 each 3   loratadine  (CLARITIN ) 10 MG tablet Take 1 tablet (10 mg total) by mouth daily. (Patient not taking: Reported on 10/31/2023)     pantoprazole  (PROTONIX ) 40 MG tablet Take 1 tablet (40 mg total) by mouth daily. (Patient not taking: Reported on 10/31/2023) 90 tablet 3   senna-docusate (SENOKOT-S) 8.6-50 MG tablet Take 2 tablets by mouth 2 (two) times daily.     [DISCONTINUED] FLUoxetine   (PROZAC ) 20 MG capsule Take 1 capsule (20 mg total) by mouth daily. 30 capsule 11   No current facility-administered medications on file prior to visit.        ROS:  All others reviewed and negative.  Objective        PE:  BP 128/84 (BP Location: Left Arm, Patient Position: Sitting, Cuff Size: Normal)   Pulse 100   Temp 97.7 F (36.5 C) (Oral)   Ht 5' 4 (1.626 m)   Wt 122 lb (55.3 kg)   SpO2 99%   BMI 20.94 kg/m                 Constitutional: Pt appears in NAD               HENT: Head: NCAT.  Right Ear: External ear normal.                 Left Ear: External ear normal.                Eyes: . Pupils are equal, round, and reactive to light. Conjunctivae and EOM are normal               Nose: without d/c or deformity               Neck: Neck supple. Gross normal ROM               Cardiovascular: Normal rate and regular rhythm.                 Pulmonary/Chest: Effort normal and breath sounds without rales or wheezing.                Abd:  Soft, NT, ND, + BS, no organomegaly               Neurological: Pt is alert. At baseline orientation, motor grossly intact               Skin: Skin is warm. No rashes, no other new lesions, LE edema - none               Psychiatric: Pt behavior is normal without agitation , mod nervous  Micro: none  Cardiac tracings I have personally interpreted today:  none  Pertinent Radiological findings (summarize): none   Lab Results  Component Value Date   WBC 5.6 10/16/2023   HGB 12.3 10/16/2023   HCT 39.1 10/16/2023   PLT 295 10/16/2023   GLUCOSE 117 (H) 10/20/2023   CHOL 225 (H) 04/21/2023   TRIG 126.0 04/21/2023   HDL 75.80 04/21/2023   LDLDIRECT 121.4 12/02/2010   LDLCALC 124 (H) 04/21/2023   ALT 69 (H) 10/13/2023   AST 32 10/13/2023   NA 134 (L) 10/20/2023   K 3.9 10/20/2023   CL 103 10/20/2023   CREATININE 1.87 (H) 10/20/2023   BUN 30 (H) 10/20/2023   CO2 21 (L) 10/20/2023   TSH 1.14 04/21/2023   INR 0.9 10/10/2023    HGBA1C 6.0 04/21/2023   Assessment/Plan:  Kathy Vargas is a 73 y.o. White or Caucasian [1] female with  has a past medical history of ABDOMINAL TENDERNESS (01/21/2011), ACUTE GINGIVITIS NONPLAQUE INDUCED (06/11/2010), ADD (09/05/2008), Alcohol abuse, in remission (09/27/2007), ALLERGIC RHINITIS (03/02/2009), Anemia, ANXIETY (07/02/2007), BACK PAIN (05/19/2009), CHRONIC OBSTRUCTIVE PULMONARY DISEASE, ACUTE EXACERBATION (08/06/2009), COPD (07/02/2007), DEGENERATIVE JOINT DISEASE, CERVICAL SPINE (07/02/2007), DEPRESSION (07/02/2007), Dyspnea, GASTROENTERITIS, VIRAL (11/29/2010), GERD (01/21/2011), Headache(784.0) (07/02/2007), History of kidney stones, Hypertension, HYPOTHYROIDISM (07/02/2007), Lumbar disc disease, Lumbar pain with radiation down right leg (06/01/2011), OSTEOARTHRITIS (07/02/2007), Other and unspecified ovarian cyst (05/19/2009), PAIN, CHRONIC, DUE TO TRAUMA (07/02/2007), PELVIC  PAIN (03/29/2010), UTI (05/19/2009), and Vitamin D  insufficiency.  COPD (chronic obstructive pulmonary disease) (HCC) Stable overall , cont inhaler prn  ICH (intracerebral hemorrhage) (HCC) With recent hx, improved now stable with little residual, has neuro f/u, gave work note today, and medically ok to return to work after mar 10 pending neurology approval as well  Allergic rhinitis Mild to mod, for depomedrol 80 mg IM,,  to f/u any worsening symptoms or concerns  Essential hypertension BP Readings from Last 3 Encounters:  11/30/23 128/84  11/03/23 122/80  10/31/23 135/81   Stable, pt to continue medical treatment norvasc  5 every day, hydralazine  100 tid   Smoker  Still has not returned to smoking, pt encourage to continue abstinence  Vitamin D  deficiency Last vitamin D  Lab Results  Component Value Date   VD25OH 73.80 04/21/2023   Stable, cont oral replacement  Followup: Return in about 2 months (around 02/02/2024).  Lynwood Rush, MD 12/03/2023 10:36 AM [facility] [place]  Primary Care - [facility] Internal Medicine

## 2023-11-30 NOTE — Patient Instructions (Signed)
 You had the steroid shot today  You are given the work note today  Please continue all other medications as before, and refills have been done if requested.  Please have the pharmacy call with any other refills you may need.  Please keep your appointments with your specialists as you may have planned - Neurology  Please make an Appointment to return in Mar 14 , or sooner if needed

## 2023-12-03 ENCOUNTER — Encounter: Payer: Self-pay | Admitting: Internal Medicine

## 2023-12-03 NOTE — Assessment & Plan Note (Signed)
 Still has not returned to smoking, pt encourage to continue abstinence

## 2023-12-03 NOTE — Assessment & Plan Note (Signed)
Stable overall, cont inhaler prn 

## 2023-12-03 NOTE — Assessment & Plan Note (Signed)
 BP Readings from Last 3 Encounters:  11/30/23 128/84  11/03/23 122/80  10/31/23 135/81   Stable, pt to continue medical treatment norvasc 5 every day, hydralazine 100 tid

## 2023-12-03 NOTE — Assessment & Plan Note (Signed)
Mild to mod, for depomedrol 80 mg IM,,  to f/u any worsening symptoms or concerns

## 2023-12-03 NOTE — Assessment & Plan Note (Signed)
 Last vitamin D Lab Results  Component Value Date   VD25OH 73.80 04/21/2023   Stable, cont oral replacement

## 2023-12-03 NOTE — Assessment & Plan Note (Signed)
 With recent hx, improved now stable with little residual, has neuro f/u, gave work note today, and medically ok to return to work after mar 10 pending neurology approval as well

## 2023-12-07 ENCOUNTER — Other Ambulatory Visit (HOSPITAL_COMMUNITY): Payer: Self-pay

## 2023-12-12 ENCOUNTER — Telehealth: Payer: Self-pay | Admitting: Internal Medicine

## 2023-12-12 ENCOUNTER — Ambulatory Visit: Payer: Self-pay | Admitting: Internal Medicine

## 2023-12-12 ENCOUNTER — Other Ambulatory Visit (HOSPITAL_COMMUNITY): Payer: Self-pay

## 2023-12-12 ENCOUNTER — Other Ambulatory Visit: Payer: Self-pay | Admitting: Internal Medicine

## 2023-12-12 MED ORDER — AMPHETAMINE-DEXTROAMPHETAMINE 30 MG PO TABS
30.0000 mg | ORAL_TABLET | Freq: Two times a day (BID) | ORAL | 0 refills | Status: DC
  Filled 2023-12-12: qty 60, 30d supply, fill #0

## 2023-12-12 MED ORDER — AMPHETAMINE-DEXTROAMPHETAMINE 30 MG PO TABS
30.0000 mg | ORAL_TABLET | Freq: Every day | ORAL | 0 refills | Status: DC
  Filled 2023-12-12: qty 60, 60d supply, fill #0

## 2023-12-12 NOTE — Telephone Encounter (Signed)
Provider resent script to reflect 2 tablets a day.

## 2023-12-12 NOTE — Telephone Encounter (Signed)
  Chief Complaint: Medication Question  Additional Notes: Patient reports that she has been taking her Adderall 2x daily for the "past few years". Per order, Adderall is only suppose to be taken 1x daily. Patient states she is unsure why the order states 1x daily as she has always taken it twice a day. Patient looking for further clarification from Dr. Jonny Ruiz regarding how she is suppose to be taking her Adderall. Patient requesting refill on her Adderall at this time as she is out due to taking 2x daily. This RN advised patient that she would forward information to appropriate person for follow-up.     Copied from CRM [id number]. Topic: Clinical - Prescription Issue >> Dec 12, 2023 12:37 PM Denese Killings wrote: Reason for CRM: Pt states that she gets 60 pills of amphetamine-dextroamphetamine (ADDERALL) 30 MG tablet and pharmacy stated that she is not due another refill yet and she is. Patient states she takes 2 a day and they only looked at the date. Patient wants doctor to call [facility] and clarify. Reason for Disposition  [1] Follow-up call to recent contact AND [2] information only call, no triage required  Answer Assessment - Initial Assessment Questions 1. REASON FOR CALL or QUESTION: "What is your reason for calling today?" or "How can I best help you?" or "What question do you have that I can help answer?"     Patient reports that she has been taking her Adderall 2x daily for the "past few years". Per order, Adderall is only suppose to be taken 1x daily. Patient states she is unsure why the order states 1x daily as she has always taken it twice a day. Patient looking for further clarification from Dr. Jonny Ruiz regarding how she is suppose to be taking her Adderall.  Protocols used: Information Only Call - No Triage-A-AH

## 2023-12-12 NOTE — Telephone Encounter (Signed)
Ok done erx 

## 2023-12-12 NOTE — Addendum Note (Signed)
Addended by: Corwin Levins on: 12/12/2023 01:15 PM   Modules accepted: Orders

## 2023-12-12 NOTE — Telephone Encounter (Signed)
Please see note below.  Copied from CRM [id number]. Topic: Clinical - Prescription Issue >> Dec 12, 2023  1:06 PM Melissa C wrote: Reason for CRM: patient called back, regarding amphetamine-dextroamphetamine (ADDERALL) 30 MG tablet stated that she has figured out the issue- according to her prescription she is supposed to get 60 pills per prescription and she takes 2 per day and she just got it in December. They may think she already had enough as she takes 2 pills per day and not 1. Patient just thinks it was an error on the pharmacy's part-as another medication she was getting filled Prudy Feeler) she takes 1.5 pills if needed.  She has been to the doctor twice in the last month to try and sort this out.

## 2023-12-12 NOTE — Telephone Encounter (Signed)
Spoke with patient, gave a verbal understanding °

## 2023-12-16 ENCOUNTER — Other Ambulatory Visit (HOSPITAL_COMMUNITY): Payer: Self-pay

## 2023-12-20 ENCOUNTER — Inpatient Hospital Stay: Payer: Self-pay | Admitting: Adult Health

## 2024-01-11 ENCOUNTER — Other Ambulatory Visit (HOSPITAL_COMMUNITY): Payer: Self-pay

## 2024-01-11 ENCOUNTER — Other Ambulatory Visit: Payer: Self-pay

## 2024-01-11 ENCOUNTER — Other Ambulatory Visit: Payer: Self-pay | Admitting: Internal Medicine

## 2024-01-11 MED ORDER — AMPHETAMINE-DEXTROAMPHETAMINE 30 MG PO TABS
30.0000 mg | ORAL_TABLET | Freq: Two times a day (BID) | ORAL | 0 refills | Status: DC
  Filled 2024-01-11: qty 60, 30d supply, fill #0

## 2024-01-16 ENCOUNTER — Other Ambulatory Visit (HOSPITAL_COMMUNITY): Payer: Self-pay

## 2024-01-29 NOTE — Progress Notes (Signed)
 This encounter was created in error - please disregard.  Called X3/ with no answer.  I LDM for patient to call office back and reschedule visit.

## 2024-02-02 ENCOUNTER — Encounter: Payer: Self-pay | Admitting: Internal Medicine

## 2024-02-02 ENCOUNTER — Other Ambulatory Visit (HOSPITAL_COMMUNITY): Payer: Self-pay

## 2024-02-02 ENCOUNTER — Ambulatory Visit: Payer: Medicare Other | Admitting: Internal Medicine

## 2024-02-02 VITALS — BP 132/78 | HR 61 | Temp 98.2°F | Ht 64.0 in | Wt 125.0 lb

## 2024-02-02 DIAGNOSIS — R739 Hyperglycemia, unspecified: Secondary | ICD-10-CM | POA: Diagnosis not present

## 2024-02-02 DIAGNOSIS — I10 Essential (primary) hypertension: Secondary | ICD-10-CM | POA: Diagnosis not present

## 2024-02-02 DIAGNOSIS — E785 Hyperlipidemia, unspecified: Secondary | ICD-10-CM | POA: Diagnosis not present

## 2024-02-02 DIAGNOSIS — F419 Anxiety disorder, unspecified: Secondary | ICD-10-CM

## 2024-02-02 DIAGNOSIS — G894 Chronic pain syndrome: Secondary | ICD-10-CM

## 2024-02-02 DIAGNOSIS — Z0000 Encounter for general adult medical examination without abnormal findings: Secondary | ICD-10-CM

## 2024-02-02 DIAGNOSIS — F988 Other specified behavioral and emotional disorders with onset usually occurring in childhood and adolescence: Secondary | ICD-10-CM | POA: Diagnosis not present

## 2024-02-02 DIAGNOSIS — Z0001 Encounter for general adult medical examination with abnormal findings: Secondary | ICD-10-CM

## 2024-02-02 MED ORDER — TRAMADOL HCL 50 MG PO TABS
50.0000 mg | ORAL_TABLET | Freq: Four times a day (QID) | ORAL | 0 refills | Status: DC | PRN
  Filled 2024-02-02: qty 60, 15d supply, fill #0

## 2024-02-02 MED ORDER — ALPRAZOLAM 0.5 MG PO TABS
0.5000 mg | ORAL_TABLET | Freq: Two times a day (BID) | ORAL | 2 refills | Status: DC | PRN
  Filled 2024-02-02 – 2024-02-21 (×3): qty 60, 30d supply, fill #0
  Filled 2024-03-27: qty 60, 30d supply, fill #1
  Filled 2024-05-03: qty 60, 30d supply, fill #2

## 2024-02-02 NOTE — Patient Instructions (Addendum)
 Please consider the Tdap tetanus shot at the pharmacy  Please take all new medication as prescribed - the tramadol  Please continue all other medications as before, and refills have been done for the xanax  Please have the pharmacy call with any other refills you may need.  Please continue your efforts at being more active, low cholesterol diet, and weight control.  You are otherwise up to date with prevention measures today.  Please keep your appointments with your specialists as you may have planned  We can hold on lab testing today  Please make an Appointment to return in 6 months, or sooner if needed

## 2024-02-02 NOTE — Assessment & Plan Note (Signed)
 With mod acute on chronic diffuse bilateral LE pain after restarted work - for limited tramadol prn,  to f/u any worsening symptoms or concerns

## 2024-02-02 NOTE — Assessment & Plan Note (Signed)
 Stable overall, for xanax refill prn,  to f/u any worsening symptoms or concerns

## 2024-02-02 NOTE — Assessment & Plan Note (Signed)
 Lab Results  Component Value Date   LDLCALC 124 (H) 04/21/2023   Uncontrolled, has not wanted statin in past, for f/u lab and reconsider tx, also for lower chol diet

## 2024-02-02 NOTE — Assessment & Plan Note (Signed)
Lab Results  Component Value Date   HGBA1C 6.0 04/21/2023   Stable, pt to continue current medical treatment  - diet, wt control

## 2024-02-02 NOTE — Progress Notes (Signed)
 Patient ID: Kathy Vargas, female   DOB: 25-Aug-1951, 73 y.o.   MRN: [id number]         Chief Complaint:: wellness exam and Medical Management of Chronic Issues (6 month follow up, needs something for leg and feet pain since going back to work )  , anxiety, ADD, htn, hld, hyperglycemia       HPI:  Kathy Vargas is a 73 y.o. female here for wellness exam; still quit smoking and has not restarted after her stroke, plans for shingrx and tdap at pharmacy; o/w up to date                        Also back to work mon Apr 10, now thinking she might have gone back too soon, but worried about keeping the job Still has signifncant pain to the legs and hips soreness due to constant and standing x 8 hr x 4 days, plans to cut back to 3 days maybe soon.  Has tried the volt gel.  Denies worsening depressive symptoms, suicidal ideation, or panic; has ongoing anxiety, Pt denies chest pain, increased sob or doe, wheezing, orthopnea, PND, increased LE swelling, palpitations, dizziness or syncope.   Pt denies polydipsia, polyuria, or new focal neuro s/s.    Pt denies fever, wt loss, night sweats, loss of appetite, or other constitutional symptoms    Wt Readings from Last 3 Encounters:  02/02/24 125 lb (56.7 kg)  11/30/23 122 lb (55.3 kg)  11/03/23 121 lb (54.9 kg)   BP Readings from Last 3 Encounters:  02/02/24 132/78  11/30/23 128/84  11/03/23 122/80   Immunization History  Administered Date(s) Administered   Fluad Quad(high Dose 65+) 10/02/2019   Influenza Split 09/07/2011, 10/11/2012   Influenza, High Dose Seasonal PF 09/27/2016, 08/15/2017, 09/04/2018   Influenza,inj,Quad PF,6+ Mos 08/22/2013, 10/15/2014, 11/26/2015   PFIZER(Purple Top)SARS-COV-2 Vaccination 03/07/2020   Pneumococcal Conjugate-13 11/26/2015   Pneumococcal Polysaccharide-23 05/30/2017   Td 12/14/2009   Zoster, Live 10/15/2014   Health Maintenance Due  Topic Date Due   Zoster Vaccines- Shingrix (1 of 2) 06/19/2001   DTaP/Tdap/Td (2  - Tdap) 12/15/2019   Medicare Annual Wellness (AWV)  09/16/2022      Past Medical History:  Diagnosis Date   ABDOMINAL TENDERNESS 01/21/2011   ACUTE GINGIVITIS NONPLAQUE INDUCED 06/11/2010   ADD 09/05/2008   Alcohol abuse, in remission 09/27/2007   ALLERGIC RHINITIS 03/02/2009   Anemia    ANXIETY 07/02/2007   BACK PAIN 05/19/2009   CHRONIC OBSTRUCTIVE PULMONARY DISEASE, ACUTE EXACERBATION 08/06/2009   COPD 07/02/2007   DEGENERATIVE JOINT DISEASE, CERVICAL SPINE 07/02/2007   DEPRESSION 07/02/2007   Dyspnea    GASTROENTERITIS, VIRAL 11/29/2010   GERD 01/21/2011   Headache(784.0) 07/02/2007   History of kidney stones    Hypertension    HYPOTHYROIDISM 07/02/2007   Lumbar disc disease    Lumbar pain with radiation down right leg 06/01/2011   OSTEOARTHRITIS 07/02/2007   Other and unspecified ovarian cyst 05/19/2009   PAIN, CHRONIC, DUE TO TRAUMA 07/02/2007   PELVIC  PAIN 03/29/2010   UTI 05/19/2009   Vitamin D insufficiency    Past Surgical History:  Procedure Laterality Date   ABDOMINAL HYSTERECTOMY     CATARACT EXTRACTION W/ INTRAOCULAR LENS IMPLANT Bilateral    CHOLECYSTECTOMY     CYSTOSCOPY/URETEROSCOPY/HOLMIUM LASER/STENT PLACEMENT Left 07/01/2022   Procedure: CYSTOSCOPY/ RETROGRADE/URETEROSCOPY/STENT PLACEMENT;  Surgeon: Jannifer Hick, MD;  Location: [facility];  Service: Urology;  Laterality: Left;  eyebrow lift     IR URETERAL STENT LEFT NEW ACCESS W/O SEP NEPHROSTOMY CATH  07/01/2022   LIPOSUCTION     LUMBAR DISC SURGERY     L4, L5   NEPHROLITHOTOMY Left 07/01/2022   Procedure: NEPHROLITHOTOMY PERCUTANEOUS/ INTERVENTIONAL RADIOLOGY TO PLACE LEFT NEPHROSTOMY TUBE PRIOR;  Surgeon: Jannifer Hick, MD;  Location: [facility];  Service: Urology;  Laterality: Left;  ONLY NEEDS 120 MIN FOR ALL PROCEDURES   URETEROSCOPY WITH HOLMIUM LASER LITHOTRIPSY Right 04/12/2022   Procedure: URETEROSCOPY WITH HOLMIUM LASER LITHOTRIPSY, STENT PLACEMENT;  Surgeon: Despina Arias, MD;   Location: [facility];  Service: Urology;  Laterality: Right;    reports that she has quit smoking. Her smoking use included cigarettes. She started smoking about 61 years ago. She has a 30.6 pack-year smoking history. She has never used smokeless tobacco. She reports that she does not currently use alcohol. She reports that she does not use drugs. family history includes Alcohol abuse in an other family member; Anxiety disorder in an other family member; Coronary artery disease in an other family member; Depression in an other family member; Stroke in an other family member. Allergies  Allergen Reactions   Morphine Nausea And Vomiting   Ceftin [Cefuroxime]     Nausea and vomiting   Current Outpatient Medications on File Prior to Visit  Medication Sig Dispense Refill   amLODipine (NORVASC) 5 MG tablet Take 1 tablet (5 mg total) by mouth daily. 90 tablet 3   amphetamine-dextroamphetamine (ADDERALL) 30 MG tablet Take 1 tablet by mouth 2 (two) times daily. 60 tablet 0   Cholecalciferol (VITAMIN D3 PO) Take 1 tablet by mouth in the morning.     diclofenac Sodium (VOLTAREN) 1 % GEL Apply 2 g topically 4 (four) times daily. 100 g 0   fluticasone (CUTIVATE) 0.005 % ointment Apply to the skin 2 times daily if needed 30 g 1   hydrALAZINE (APRESOLINE) 100 MG tablet Take 1 tablet (100 mg total) by mouth 3 (three) times daily. 90 tablet 11   nicotine (NICODERM CQ - DOSED IN MG/24 HOURS) 21 mg/24hr patch Place 1 patch (21 mg total) onto the skin daily. 14 patch 0   senna-docusate (SENOKOT-S) 8.6-50 MG tablet Take 2 tablets by mouth 2 (two) times daily.     acetaminophen (TYLENOL) 325 MG tablet Take 1-2 tablets (325-650 mg total) by mouth every 4 (four) hours as needed for mild pain (pain score 1-3). (Patient not taking: Reported on 02/02/2024)     Biotin w/ Vitamins C & E (HAIR/SKIN/NAILS PO) Take 1 tablet by mouth daily. (Patient not taking: Reported on 10/31/2023)     Calcium-Magnesium-Zinc (CAL-MAG-ZINC PO)  Take 1 tablet by mouth at bedtime. (Patient not taking: Reported on 10/31/2023)     cyanocobalamin (VITAMIN B12) 1000 MCG tablet Take 1 tablet (1,000 mcg total) by mouth daily. (Patient not taking: Reported on 10/31/2023) 90 tablet 3   fluticasone-salmeterol (ADVAIR DISKUS) 250-50 MCG/ACT AEPB Inhale 1 puff into the lungs 2 (two) times daily as needed (Asthma). (Patient not taking: Reported on 10/31/2023) 180 each 3   loratadine (CLARITIN) 10 MG tablet Take 1 tablet (10 mg total) by mouth daily. (Patient not taking: Reported on 02/02/2024)     pantoprazole (PROTONIX) 40 MG tablet Take 1 tablet (40 mg total) by mouth daily. (Patient not taking: Reported on 10/31/2023) 90 tablet 3   [DISCONTINUED] FLUoxetine (PROZAC) 20 MG capsule Take 1 capsule (20 mg total) by mouth daily. 30 capsule 11  No current facility-administered medications on file prior to visit.        ROS:  All others reviewed and negative.  Objective        PE:  BP 132/78 (BP Location: Right Arm, Patient Position: Sitting, Cuff Size: Normal)   Pulse 61   Temp 98.2 F (36.8 C) (Oral)   Ht 5\' 4"  (1.626 m)   Wt 125 lb (56.7 kg)   SpO2 99%   BMI 21.46 kg/m                 Constitutional: Pt appears in NAD               HENT: Head: NCAT.                Right Ear: External ear normal.                 Left Ear: External ear normal.                Eyes: . Pupils are equal, round, and reactive to light. Conjunctivae and EOM are normal               Nose: without d/c or deformity               Neck: Neck supple. Gross normal ROM               Cardiovascular: Normal rate and regular rhythm.                 Pulmonary/Chest: Effort normal and breath sounds without rales or wheezing.                Abd:  Soft, NT, ND, + BS, no organomegaly               Neurological: Pt is alert. At baseline orientation, motor grossly intact               Skin: Skin is warm. No rashes, no other new lesions, LE edema - none               Psychiatric:  Pt behavior is normal without agitation   Micro: none  Cardiac tracings I have personally interpreted today:  none  Pertinent Radiological findings (summarize): none   Lab Results  Component Value Date   WBC 5.6 10/16/2023   HGB 12.3 10/16/2023   HCT 39.1 10/16/2023   PLT 295 10/16/2023   GLUCOSE 117 (H) 10/20/2023   CHOL 225 (H) 04/21/2023   TRIG 126.0 04/21/2023   HDL 75.80 04/21/2023   LDLDIRECT 121.4 12/02/2010   LDLCALC 124 (H) 04/21/2023   ALT 69 (H) 10/13/2023   AST 32 10/13/2023   NA 134 (L) 10/20/2023   K 3.9 10/20/2023   CL 103 10/20/2023   CREATININE 1.87 (H) 10/20/2023   BUN 30 (H) 10/20/2023   CO2 21 (L) 10/20/2023   TSH 1.14 04/21/2023   INR 0.9 10/10/2023   HGBA1C 6.0 04/21/2023   Assessment/Plan:  Kathy Vargas is a 73 y.o. White or Caucasian [1] female with  has a past medical history of ABDOMINAL TENDERNESS (01/21/2011), ACUTE GINGIVITIS NONPLAQUE INDUCED (06/11/2010), ADD (09/05/2008), Alcohol abuse, in remission (09/27/2007), ALLERGIC RHINITIS (03/02/2009), Anemia, ANXIETY (07/02/2007), BACK PAIN (05/19/2009), CHRONIC OBSTRUCTIVE PULMONARY DISEASE, ACUTE EXACERBATION (08/06/2009), COPD (07/02/2007), DEGENERATIVE JOINT DISEASE, CERVICAL SPINE (07/02/2007), DEPRESSION (07/02/2007), Dyspnea, GASTROENTERITIS, VIRAL (11/29/2010), GERD (01/21/2011), Headache(784.0) (07/02/2007), History of kidney stones, Hypertension, HYPOTHYROIDISM (07/02/2007), Lumbar disc disease, Lumbar pain with radiation  down right leg (06/01/2011), OSTEOARTHRITIS (07/02/2007), Other and unspecified ovarian cyst (05/19/2009), PAIN, CHRONIC, DUE TO TRAUMA (07/02/2007), PELVIC  PAIN (03/29/2010), UTI (05/19/2009), and Vitamin D insufficiency.  Attention deficit disorder Stable overall, cont adderal 30 bid  Chronic pain syndrome With mod acute on chronic diffuse bilateral LE pain after restarted work - for limited tramadol prn,  to f/u any worsening symptoms or concerns  Encounter for well  adult exam with abnormal findings Age and sex appropriate education and counseling updated with regular exercise and diet Referrals for preventative services - none needed Immunizations addressed - for tdap and shingrix at pharmacy Smoking counseling  - none needed Evidence for depression or other mood disorder - none significant Most recent labs reviewed. I have personally reviewed and have noted: 1) the patient's medical and social history 2) The patient's current medications and supplements 3) The patient's height, weight, and BMI have been recorded in the chart   Anxiety Stable overall, for xanax refill prn,  to f/u any worsening symptoms or concerns  Essential hypertension BP Readings from Last 3 Encounters:  02/02/24 132/78  11/30/23 128/84  11/03/23 122/80   Stable, pt to continue medical treatment norvasc  5 every day, hydralazine 100 tid   Hyperglycemia Lab Results  Component Value Date   HGBA1C 6.0 04/21/2023   Stable, pt to continue current medical treatment  - diet, wt control   Hyperlipidemia Lab Results  Component Value Date   LDLCALC 124 (H) 04/21/2023   Uncontrolled, has not wanted statin in past, for f/u lab and reconsider tx, also for lower chol diet  Followup: Return in about 6 months (around 08/04/2024).  Oliver Barre, MD 02/02/2024 12:06 PM [facility]

## 2024-02-02 NOTE — Assessment & Plan Note (Signed)
 Stable overall, cont adderal 30 bid

## 2024-02-02 NOTE — Assessment & Plan Note (Signed)

## 2024-02-02 NOTE — Assessment & Plan Note (Signed)
 BP Readings from Last 3 Encounters:  02/02/24 132/78  11/30/23 128/84  11/03/23 122/80   Stable, pt to continue medical treatment norvasc  5 every day, hydralazine 100 tid

## 2024-02-03 ENCOUNTER — Other Ambulatory Visit (HOSPITAL_COMMUNITY): Payer: Self-pay

## 2024-02-19 ENCOUNTER — Telehealth: Payer: Self-pay | Admitting: Internal Medicine

## 2024-02-19 ENCOUNTER — Other Ambulatory Visit (HOSPITAL_COMMUNITY): Payer: Self-pay

## 2024-02-19 MED ORDER — PREDNISONE 10 MG PO TABS
ORAL_TABLET | ORAL | 0 refills | Status: AC
  Filled 2024-02-19: qty 18, 9d supply, fill #0

## 2024-02-19 NOTE — Telephone Encounter (Signed)
Ok this time - done erx 

## 2024-02-19 NOTE — Telephone Encounter (Unsigned)
 Copied from CRM [id number]. Topic: General - Other >> Feb 19, 2024 10:18 AM Truddie Crumble wrote: Reason for CRM: patient called stating she saw the provider two weeks for an appointment. Patient need prednisone because of the pollen and her breathing. Patient states she has inhalers and the inhalers are not working.

## 2024-02-20 ENCOUNTER — Other Ambulatory Visit (HOSPITAL_COMMUNITY): Payer: Self-pay

## 2024-02-21 ENCOUNTER — Other Ambulatory Visit (HOSPITAL_COMMUNITY): Payer: Self-pay

## 2024-02-21 ENCOUNTER — Other Ambulatory Visit: Payer: Self-pay | Admitting: Internal Medicine

## 2024-02-22 ENCOUNTER — Other Ambulatory Visit (HOSPITAL_COMMUNITY): Payer: Self-pay

## 2024-02-22 MED ORDER — AMPHETAMINE-DEXTROAMPHETAMINE 30 MG PO TABS
30.0000 mg | ORAL_TABLET | Freq: Two times a day (BID) | ORAL | 0 refills | Status: DC
  Filled 2024-02-22: qty 60, 30d supply, fill #0

## 2024-02-23 ENCOUNTER — Ambulatory Visit: Payer: Self-pay

## 2024-02-23 DIAGNOSIS — R0600 Dyspnea, unspecified: Secondary | ICD-10-CM

## 2024-02-23 NOTE — Addendum Note (Signed)
 Addended by: Corwin Levins on: 02/23/2024 05:03 PM   Modules accepted: Orders

## 2024-02-23 NOTE — Telephone Encounter (Signed)
 Ok for referral, but this may take some time, and pt should go to ED or UC or this office if less emergent if the breathing difficulty persists or gets worse, or any other unusual symptoms such as pain, fever

## 2024-02-23 NOTE — Telephone Encounter (Signed)
 Chief Complaint: SOB Symptoms: brain fog Frequency: Ongoing, worsening Pertinent Negatives: Patient denies CP, fever Disposition: [] ED /[] Urgent Care (no appt availability in office) / [x] Appointment(In office/virtual)/ []  [facility]/ [] Home Care/ [x] Refused Recommended Disposition /[] [facility]/ []  Follow-up with PCP Additional Notes: Pt reports ongoing intermittent abd bloating that has been occurring since she had a stent placed following a kidney stone. Pt reports when the bloating occurs she feels it is pressing on her diaphragm and making her feel SOB. OV offered today, declines. Pt reports she will only see PCP. She is requesting pulmonary referral and requests and MD PCP recommends. This RN educated pt on home care, new-worsening symptoms, when to call back/seek emergent care. Pt verbalized understanding and agrees to plan.    Copied from CRM [id number]. Topic: Clinical - Red Word Triage >> Feb 23, 2024 11:54 AM Elizebeth Brooking wrote: [facility] that prompted transfer to Nurse Triage: Patient daughter called in stated she is still having problems with her allergies, have been having shortness of breath  Melanie Reason for Disposition  [1] MILD difficulty breathing (e.g., minimal/no SOB at rest, SOB with walking, pulse <100) AND [2] NEW-onset or WORSE than normal  Answer Assessment - Initial Assessment Questions 1. RESPIRATORY STATUS: "Describe your breathing?" (e.g., wheezing, shortness of breath, unable to speak, severe coughing)      "Stomach is bloated' causing discomfort 2. ONSET: "When did this breathing problem begin?"      Began after stent placement 3. PATTERN "Does the difficult breathing come and go, or has it been constant since it started?"      Intermittent 4. SEVERITY: "How bad is your breathing?" (e.g., mild, moderate, severe)    - MILD: No SOB at rest, mild SOB with walking, speaks normally in sentences, can lie down, no retractions, pulse < 100.    -  MODERATE: SOB at rest, SOB with minimal exertion and prefers to sit, cannot lie down flat, speaks in phrases, mild retractions, audible wheezing, pulse 100-120.    - SEVERE: Very SOB at rest, speaks in single words, struggling to breathe, sitting hunched forward, retractions, pulse > 120      Moderate, when it starts it is constant 5. RECURRENT SYMPTOM: "Have you had difficulty breathing before?" If Yes, ask: "When was the last time?" and "What happened that time?"      Yes 6. CARDIAC HISTORY: "Do you have any history of heart disease?" (e.g., heart attack, angina, bypass surgery, angioplasty)      None 7. LUNG HISTORY: "Do you have any history of lung disease?"  (e.g., pulmonary embolus, asthma, emphysema)     None 8. CAUSE: "What do you think is causing the breathing problem?"      Pt believes abd bloating 9. OTHER SYMPTOMS: "Do you have any other symptoms? (e.g., dizziness, runny nose, cough, chest pain, fever)     Brain fog  Protocols used: Breathing Difficulty-A-AH

## 2024-03-27 ENCOUNTER — Other Ambulatory Visit: Payer: Self-pay | Admitting: Internal Medicine

## 2024-03-27 ENCOUNTER — Other Ambulatory Visit (HOSPITAL_COMMUNITY): Payer: Self-pay

## 2024-03-27 MED ORDER — AMPHETAMINE-DEXTROAMPHETAMINE 30 MG PO TABS
30.0000 mg | ORAL_TABLET | Freq: Two times a day (BID) | ORAL | 0 refills | Status: DC
  Filled 2024-03-27 – 2024-03-29 (×2): qty 60, 30d supply, fill #0

## 2024-03-29 ENCOUNTER — Other Ambulatory Visit (HOSPITAL_COMMUNITY): Payer: Self-pay

## 2024-05-03 ENCOUNTER — Other Ambulatory Visit: Payer: Self-pay | Admitting: Internal Medicine

## 2024-05-03 ENCOUNTER — Other Ambulatory Visit (HOSPITAL_COMMUNITY): Payer: Self-pay

## 2024-05-06 ENCOUNTER — Other Ambulatory Visit (HOSPITAL_COMMUNITY): Payer: Self-pay

## 2024-05-06 MED ORDER — AMPHETAMINE-DEXTROAMPHETAMINE 30 MG PO TABS
30.0000 mg | ORAL_TABLET | Freq: Two times a day (BID) | ORAL | 0 refills | Status: DC
  Filled 2024-05-06: qty 60, 30d supply, fill #0

## 2024-05-29 DIAGNOSIS — G5610 Other lesions of median nerve, unspecified upper limb: Secondary | ICD-10-CM | POA: Diagnosis not present

## 2024-06-17 ENCOUNTER — Other Ambulatory Visit: Payer: Self-pay | Admitting: Internal Medicine

## 2024-06-17 ENCOUNTER — Other Ambulatory Visit (HOSPITAL_COMMUNITY): Payer: Self-pay

## 2024-06-18 ENCOUNTER — Other Ambulatory Visit (HOSPITAL_COMMUNITY): Payer: Self-pay

## 2024-06-19 ENCOUNTER — Other Ambulatory Visit (HOSPITAL_COMMUNITY): Payer: Self-pay

## 2024-06-19 MED ORDER — AMPHETAMINE-DEXTROAMPHETAMINE 30 MG PO TABS
30.0000 mg | ORAL_TABLET | Freq: Two times a day (BID) | ORAL | 0 refills | Status: DC
  Filled 2024-06-19: qty 60, 30d supply, fill #0

## 2024-06-19 MED ORDER — ALPRAZOLAM 0.5 MG PO TABS
0.5000 mg | ORAL_TABLET | Freq: Two times a day (BID) | ORAL | 2 refills | Status: DC | PRN
  Filled 2024-06-19: qty 60, 30d supply, fill #0

## 2024-06-21 ENCOUNTER — Ambulatory Visit: Admitting: Internal Medicine

## 2024-06-21 ENCOUNTER — Ambulatory Visit: Payer: Self-pay | Admitting: *Deleted

## 2024-06-21 ENCOUNTER — Ambulatory Visit: Payer: Self-pay | Admitting: Internal Medicine

## 2024-06-21 ENCOUNTER — Other Ambulatory Visit (HOSPITAL_COMMUNITY): Payer: Self-pay

## 2024-06-21 ENCOUNTER — Encounter: Payer: Self-pay | Admitting: Internal Medicine

## 2024-06-21 VITALS — BP 188/102 | HR 99 | Temp 99.3°F | Ht 64.0 in

## 2024-06-21 DIAGNOSIS — R350 Frequency of micturition: Secondary | ICD-10-CM

## 2024-06-21 DIAGNOSIS — R739 Hyperglycemia, unspecified: Secondary | ICD-10-CM

## 2024-06-21 DIAGNOSIS — E785 Hyperlipidemia, unspecified: Secondary | ICD-10-CM

## 2024-06-21 DIAGNOSIS — J018 Other acute sinusitis: Secondary | ICD-10-CM

## 2024-06-21 DIAGNOSIS — I10 Essential (primary) hypertension: Secondary | ICD-10-CM

## 2024-06-21 DIAGNOSIS — J069 Acute upper respiratory infection, unspecified: Secondary | ICD-10-CM | POA: Diagnosis not present

## 2024-06-21 DIAGNOSIS — E538 Deficiency of other specified B group vitamins: Secondary | ICD-10-CM | POA: Diagnosis not present

## 2024-06-21 DIAGNOSIS — J438 Other emphysema: Secondary | ICD-10-CM

## 2024-06-21 DIAGNOSIS — E559 Vitamin D deficiency, unspecified: Secondary | ICD-10-CM | POA: Diagnosis not present

## 2024-06-21 LAB — URINALYSIS, ROUTINE W REFLEX MICROSCOPIC
Bilirubin Urine: NEGATIVE
Ketones, ur: NEGATIVE
Nitrite: POSITIVE — AB
Specific Gravity, Urine: 1.02 (ref 1.000–1.030)
Total Protein, Urine: 100 — AB
Urine Glucose: NEGATIVE
Urobilinogen, UA: 1 (ref 0.0–1.0)
pH: 6.5 (ref 5.0–8.0)

## 2024-06-21 LAB — BASIC METABOLIC PANEL WITH GFR
BUN: 23 mg/dL (ref 6–23)
CO2: 23 meq/L (ref 19–32)
Calcium: 9.3 mg/dL (ref 8.4–10.5)
Chloride: 99 meq/L (ref 96–112)
Creatinine, Ser: 1.78 mg/dL — ABNORMAL HIGH (ref 0.40–1.20)
GFR: 28.08 mL/min — ABNORMAL LOW (ref >60.00)
Glucose, Bld: 125 mg/dL — ABNORMAL HIGH (ref 70–99)
Potassium: 4 meq/L (ref 3.5–5.1)
Sodium: 131 meq/L — ABNORMAL LOW (ref 135–145)

## 2024-06-21 LAB — HEPATIC FUNCTION PANEL
ALT: 28 U/L (ref 0–35)
AST: 33 U/L (ref 0–37)
Albumin: 4 g/dL (ref 3.5–5.2)
Alkaline Phosphatase: 75 U/L (ref 39–117)
Bilirubin, Direct: 0.1 mg/dL (ref 0.0–0.3)
Total Bilirubin: 0.6 mg/dL (ref 0.2–1.2)
Total Protein: 7.4 g/dL (ref 6.0–8.3)

## 2024-06-21 LAB — CBC WITH DIFFERENTIAL/PLATELET
Basophils Absolute: 0 K/uL (ref 0.0–0.1)
Basophils Relative: 0.1 % (ref 0.0–3.0)
Eosinophils Absolute: 0 K/uL (ref 0.0–0.7)
Eosinophils Relative: 0 % (ref 0.0–5.0)
HCT: 39.8 % (ref 36.0–46.0)
Hemoglobin: 13.2 g/dL (ref 12.0–15.0)
Lymphocytes Relative: 5.3 % — ABNORMAL LOW (ref 12.0–46.0)
Lymphs Abs: 0.5 K/uL — ABNORMAL LOW (ref 0.7–4.0)
MCHC: 33.2 g/dL (ref 30.0–36.0)
MCV: 85 fl (ref 78.0–100.0)
Monocytes Absolute: 0.6 K/uL (ref 0.1–1.0)
Monocytes Relative: 6.2 % (ref 3.0–12.0)
Neutro Abs: 9.1 K/uL — ABNORMAL HIGH (ref 1.4–7.7)
Neutrophils Relative %: 88.4 % — ABNORMAL HIGH (ref 43.0–77.0)
Platelets: 252 K/uL (ref 150.0–400.0)
RBC: 4.69 Mil/uL (ref 3.87–5.11)
RDW: 13.6 % (ref 11.5–15.5)
WBC: 10.3 K/uL (ref 4.0–10.5)

## 2024-06-21 LAB — VITAMIN D 25 HYDROXY (VIT D DEFICIENCY, FRACTURES): VITD: 82.59 ng/mL (ref 30.00–100.00)

## 2024-06-21 LAB — LIPID PANEL
Cholesterol: 208 mg/dL — ABNORMAL HIGH (ref 0–200)
HDL: 81.2 mg/dL (ref >39.00)
LDL Cholesterol: 106 mg/dL — ABNORMAL HIGH (ref 0–99)
NonHDL: 127.25
Total CHOL/HDL Ratio: 3
Triglycerides: 104 mg/dL (ref 0.0–149.0)
VLDL: 20.8 mg/dL (ref 0.0–40.0)

## 2024-06-21 LAB — HEMOGLOBIN A1C: Hgb A1c MFr Bld: 6.2 % (ref 4.6–6.5)

## 2024-06-21 LAB — TSH: TSH: 0.69 u[IU]/mL (ref 0.35–5.50)

## 2024-06-21 LAB — VITAMIN B12: Vitamin B-12: 651 pg/mL (ref 211–911)

## 2024-06-21 MED ORDER — PREDNISONE 10 MG PO TABS
ORAL_TABLET | ORAL | 0 refills | Status: DC
  Filled 2024-06-21: qty 18, 9d supply, fill #0

## 2024-06-21 MED ORDER — CEFTRIAXONE SODIUM 1 G IJ SOLR: 1.0000 g | Freq: Once | INTRAMUSCULAR | Status: DC

## 2024-06-21 NOTE — Assessment & Plan Note (Signed)
 Lab Results  Component Value Date   HGBA1C 6.2 06/21/2024   Stable, pt to continue current medical treatment  - diet, wt control

## 2024-06-21 NOTE — Assessment & Plan Note (Signed)
 Stable , cont inhaler prn

## 2024-06-21 NOTE — Assessment & Plan Note (Signed)
 Also for ua and cx

## 2024-06-21 NOTE — Assessment & Plan Note (Addendum)
 Mod to severe sinusitis, for antibx course levaquin  500 mg qd,  to f/u any worsening symptoms or concerns, also rocephin  1 gm in light of sinusitis and probable uti

## 2024-06-21 NOTE — Patient Instructions (Signed)
 You had the antibiotic shot today  Please take all new medication as prescribed - the antibiotic  Please continue all other medications as before, and refills have been done if requested.  Please have the pharmacy call with any other refills you may need.  Please keep your appointments with your specialists as you may have planned  Please go to the LAB at the blood drawing area for the tests to be done  - just the urine testing today  You will be contacted by phone if any changes need to be made immediately.  Otherwise, you will receive a letter about your results with an explanation, but please check with MyChart first.

## 2024-06-21 NOTE — Assessment & Plan Note (Signed)
 Last vitamin D  Lab Results  Component Value Date   VD25OH 82.59 06/21/2024   Stable, cont oral replacement

## 2024-06-21 NOTE — Telephone Encounter (Signed)
 Copied from CRM [id number]. Topic: Clinical - Red Word Triage >> Jun 21, 2024  8:11 AM Sasha H wrote: [facility] that prompted transfer to Nurse Triage: Pt states she got super sick yesterday with neck pain, chest congestion, and a severe headache. Neck pain is a 6/10 and headache is a 8/10. Reason for Disposition  [1] MODERATE headache (e.g., interferes with normal activities) AND [2] present > 24 hours AND [3] unexplained  (Exceptions: Pain medicines not tried, typical migraine, or headache part of viral illness.)  Answer Assessment - Initial Assessment Questions 1. LOCATION: Where does it hurt?      My neck is sore and when I swallow my ears hurt.   I have a headache over my left eye and bridge of my nose.   I'm hoarse at times.  I feel so bad.   My body hurts.  No fever now but I did yesterday.     I'm having chest congestion and coughing.   This congestion comes and goes.   I'll cough up mucus it's clear.   I really don't know what color it is.   I get sinus infections all the time.    I think I have a sinus infection. 2. ONSET: When did the headache start? (e.g., minutes, hours, days)      Yesterday 3. PATTERN: Does the pain come and go, or has it been constant since it started?     Constant now 4. SEVERITY: How bad is the pain? and What does it keep you from doing?  (e.g., Scale 1-10; mild, moderate, or severe)     Severe 5. RECURRENT SYMPTOM: Have you ever had headaches before? If Yes, ask: When was the last time? and What happened that time?      Yes I get sinus headaches a lot.   6. CAUSE: What do you think is causing the headache?     Sinus infection 7. MIGRAINE: Have you been diagnosed with migraine headaches? If Yes, ask: Is this headache similar?      Not asked 8. HEAD INJURY: Has there been any recent injury to your head?      Not asked 9. OTHER SYMPTOMS: Do you have any other symptoms? (e.g., fever, stiff neck, eye pain, sore throat, cold symptoms)     See  above 10. PREGNANCY: Is there any chance you are pregnant? When was your last menstrual period?       N/A due to age  Protocols used: Headache-A-AH FYI Only or Action Required?: FYI only for provider.  Patient was last seen in primary care on 02/02/2024 by Norleen Lynwood ORN, MD.  Called Nurse Triage reporting Headache. Bad headache over eye and bridge of nose.  Sinus congestion, coughing, chest congestion, hoarseness, body aches   Symptoms began yesterday.  Interventions attempted: Nothing.  Symptoms are: rapidly worsening.  Triage Disposition: See Physician Within 24 Hours  Patient/caregiver understands and will follow disposition?: Yes

## 2024-06-21 NOTE — Assessment & Plan Note (Signed)
 Lab Results  Component Value Date   LDLCALC 106 (H) 06/21/2024   uncontrolled, pt for lower chol diet, declines statin

## 2024-06-21 NOTE — Progress Notes (Signed)
 Patient ID: Kathy Vargas, female   DOB: [birth year], 73 y.o.   MRN: [id number]        Chief Complaint: follow up sinusitis, dysuria, low vit d, hld, hyperglycemia, htn       HPI:  Kathy Vargas is a 73 y.o. female  Here with 2-3 days acute onset fever, facial pain, pressure, headache, general weakness and malaise, and greenish d/c, with mild ST and cough, but pt denies chest pain, wheezing, increased sob or doe, orthopnea, PND, increased LE swelling, palpitations, dizziness or syncope.  Also incidentally with dysuria x 3 days but Denies urinary symptoms such as frequency, urgency, flank pain, hematuria or n/v.         Wt Readings from Last 3 Encounters:  02/02/24 125 lb (56.7 kg)  11/30/23 122 lb (55.3 kg)  11/03/23 121 lb (54.9 kg)   BP Readings from Last 3 Encounters:  06/21/24 (!) 188/102  02/02/24 132/78  11/30/23 128/84         Past Medical History:  Diagnosis Date   ABDOMINAL TENDERNESS 01/21/2011   ACUTE GINGIVITIS NONPLAQUE INDUCED 06/11/2010   ADD 09/05/2008   Alcohol abuse, in remission 09/27/2007   ALLERGIC RHINITIS 03/02/2009   Anemia    ANXIETY 07/02/2007   BACK PAIN 05/19/2009   CHRONIC OBSTRUCTIVE PULMONARY DISEASE, ACUTE EXACERBATION 08/06/2009   COPD 07/02/2007   DEGENERATIVE JOINT DISEASE, CERVICAL SPINE 07/02/2007   DEPRESSION 07/02/2007   Dyspnea    GASTROENTERITIS, VIRAL 11/29/2010   GERD 01/21/2011   Headache(784.0) 07/02/2007   History of kidney stones    Hypertension    HYPOTHYROIDISM 07/02/2007   Lumbar disc disease    Lumbar pain with radiation down right leg 06/01/2011   OSTEOARTHRITIS 07/02/2007   Other and unspecified ovarian cyst 05/19/2009   PAIN, CHRONIC, DUE TO TRAUMA 07/02/2007   PELVIC  PAIN 03/29/2010   UTI 05/19/2009   Vitamin D  insufficiency    Past Surgical History:  Procedure Laterality Date   ABDOMINAL HYSTERECTOMY     CATARACT EXTRACTION W/ INTRAOCULAR LENS IMPLANT Bilateral    CHOLECYSTECTOMY      CYSTOSCOPY/URETEROSCOPY/HOLMIUM LASER/STENT PLACEMENT Left 07/01/2022   Procedure: CYSTOSCOPY/ RETROGRADE/URETEROSCOPY/STENT PLACEMENT;  Surgeon: Selma Donnice SAUNDERS, MD;  Location: [facility];  Service: Urology;  Laterality: Left;   eyebrow lift     IR URETERAL STENT LEFT NEW ACCESS W/O SEP NEPHROSTOMY CATH  07/01/2022   LIPOSUCTION     LUMBAR DISC SURGERY     L4, L5   NEPHROLITHOTOMY Left 07/01/2022   Procedure: NEPHROLITHOTOMY PERCUTANEOUS/ INTERVENTIONAL RADIOLOGY TO PLACE LEFT NEPHROSTOMY TUBE PRIOR;  Surgeon: Selma Donnice SAUNDERS, MD;  Location: [facility];  Service: Urology;  Laterality: Left;  ONLY NEEDS 120 MIN FOR ALL PROCEDURES   URETEROSCOPY WITH HOLMIUM LASER LITHOTRIPSY Right 04/12/2022   Procedure: URETEROSCOPY WITH HOLMIUM LASER LITHOTRIPSY, STENT PLACEMENT;  Surgeon: Lovie Arlyss CROME, MD;  Location: [facility];  Service: Urology;  Laterality: Right;    reports that she has quit smoking. Her smoking use included cigarettes. She started smoking about 61 years ago. She has a 30.8 pack-year smoking history. She has never used smokeless tobacco. She reports that she does not currently use alcohol. She reports that she does not use drugs. family history includes Alcohol abuse in an other family member; Anxiety disorder in an other family member; Coronary artery disease in an other family member; Depression in an other family member; Stroke in an other family member. Allergies  Allergen Reactions   Morphine Nausea And Vomiting  Ceftin  [Cefuroxime ]     Nausea and vomiting   Current Outpatient Medications on File Prior to Visit  Medication Sig Dispense Refill   ALPRAZolam  (XANAX ) 0.5 MG tablet Take 1 tablet (0.5 mg) by mouth 2 times daily as needed. 60 tablet 2   amLODipine  (NORVASC ) 5 MG tablet Take 1 tablet (5 mg total) by mouth daily. 90 tablet 3   amphetamine -dextroamphetamine  (ADDERALL ) 30 MG tablet Take 1 tablet by mouth 2 (two) times daily. 60 tablet 0   Cholecalciferol (VITAMIN D3 PO) Take 1 tablet  by mouth in the morning.     diclofenac  Sodium (VOLTAREN ) 1 % GEL Apply 2 g topically 4 (four) times daily. 100 g 0   fluticasone  (CUTIVATE ) 0.005 % ointment Apply to the skin 2 times daily if needed 30 g 1   hydrALAZINE  (APRESOLINE ) 100 MG tablet Take 1 tablet (100 mg total) by mouth 3 (three) times daily. 90 tablet 11   nicotine  (NICODERM CQ  - DOSED IN MG/24 HOURS) 21 mg/24hr patch Place 1 patch (21 mg total) onto the skin daily. 14 patch 0   senna-docusate (SENOKOT-S) 8.6-50 MG tablet Take 2 tablets by mouth 2 (two) times daily.     traMADol  (ULTRAM ) 50 MG tablet Take 1 tablet (50 mg total) by mouth every 6 (six) hours as needed. 60 tablet 0   acetaminophen  (TYLENOL ) 325 MG tablet Take 1-2 tablets (325-650 mg total) by mouth every 4 (four) hours as needed for mild pain (pain score 1-3). (Patient not taking: Reported on 06/21/2024)     Biotin w/ Vitamins C & E (HAIR/SKIN/NAILS PO) Take 1 tablet by mouth daily. (Patient not taking: Reported on 06/21/2024)     Calcium -Magnesium -Zinc (CAL-MAG-ZINC PO) Take 1 tablet by mouth at bedtime. (Patient not taking: Reported on 06/21/2024)     cyanocobalamin  (VITAMIN B12) 1000 MCG tablet Take 1 tablet (1,000 mcg total) by mouth daily. (Patient not taking: Reported on 06/21/2024) 90 tablet 3   fluticasone -salmeterol (ADVAIR  DISKUS) 250-50 MCG/ACT AEPB Inhale 1 puff into the lungs 2 (two) times daily as needed (Asthma). (Patient not taking: Reported on 06/21/2024) 180 each 3   loratadine  (CLARITIN ) 10 MG tablet Take 1 tablet (10 mg total) by mouth daily. (Patient not taking: Reported on 06/21/2024)     pantoprazole  (PROTONIX ) 40 MG tablet Take 1 tablet (40 mg total) by mouth daily. (Patient not taking: Reported on 06/21/2024) 90 tablet 3   [DISCONTINUED] FLUoxetine  (PROZAC ) 20 MG capsule Take 1 capsule (20 mg total) by mouth daily. 30 capsule 11   No current facility-administered medications on file prior to visit.        ROS:  All others reviewed and negative.  Objective         PE:  BP (!) 188/102   Pulse 99   Temp 99.3 F (37.4 C)   Ht 5' 4 (1.626 m)   SpO2 97%   BMI 21.46 kg/m                 Constitutional: Pt appears mild to mod ill               HENT: Head: NCAT.                Right Ear: External ear normal.                 Left Ear: External ear normal. Bilat tm's with mild erythema.  Max sinus areas mild tender.  Pharynx with mild erythema, no exudate  Eyes: . Pupils are equal, round, and reactive to light. Conjunctivae and EOM are normal               Nose: without d/c or deformity               Neck: Neck supple. Gross normal ROM               Cardiovascular: Normal rate and regular rhythm.                 Pulmonary/Chest: Effort normal and breath sounds without rales or wheezing.                Abd:  Soft, NT, ND, + BS, no organomegaly               Neurological: Pt is alert. At baseline orientation, motor grossly intact               Skin: Skin is warm. No rashes, no other new lesions, LE edema - none               Psychiatric: Pt behavior is normal without agitation   Micro: none  Cardiac tracings I have personally interpreted today:  none  Pertinent Radiological findings (summarize): none   Lab Results  Component Value Date   WBC 10.3 06/21/2024   HGB 13.2 06/21/2024   HCT 39.8 06/21/2024   PLT 252.0 06/21/2024   GLUCOSE 125 (H) 06/21/2024   CHOL 208 (H) 06/21/2024   TRIG 104.0 06/21/2024   HDL 81.20 06/21/2024   LDLDIRECT 121.4 12/02/2010   LDLCALC 106 (H) 06/21/2024   ALT 28 06/21/2024   AST 33 06/21/2024   NA 131 (L) 06/21/2024   K 4.0 06/21/2024   CL 99 06/21/2024   CREATININE 1.78 (H) 06/21/2024   BUN 23 06/21/2024   CO2 23 06/21/2024   TSH 0.69 06/21/2024   INR 0.9 10/10/2023   HGBA1C 6.2 06/21/2024   Assessment/Plan:  Kathy Vargas is a 73 y.o. White or Caucasian [1] female with  has a past medical history of ABDOMINAL TENDERNESS (01/21/2011), ACUTE GINGIVITIS NONPLAQUE INDUCED (06/11/2010),  ADD (09/05/2008), Alcohol abuse, in remission (09/27/2007), ALLERGIC RHINITIS (03/02/2009), Anemia, ANXIETY (07/02/2007), BACK PAIN (05/19/2009), CHRONIC OBSTRUCTIVE PULMONARY DISEASE, ACUTE EXACERBATION (08/06/2009), COPD (07/02/2007), DEGENERATIVE JOINT DISEASE, CERVICAL SPINE (07/02/2007), DEPRESSION (07/02/2007), Dyspnea, GASTROENTERITIS, VIRAL (11/29/2010), GERD (01/21/2011), Headache(784.0) (07/02/2007), History of kidney stones, Hypertension, HYPOTHYROIDISM (07/02/2007), Lumbar disc disease, Lumbar pain with radiation down right leg (06/01/2011), OSTEOARTHRITIS (07/02/2007), Other and unspecified ovarian cyst (05/19/2009), PAIN, CHRONIC, DUE TO TRAUMA (07/02/2007), PELVIC  PAIN (03/29/2010), UTI (05/19/2009), and Vitamin D  insufficiency.  COPD (chronic obstructive pulmonary disease) (HCC) Stable, cont inhaler prn  Vitamin D  deficiency Last vitamin D  Lab Results  Component Value Date   VD25OH 82.59 06/21/2024   Stable, cont oral replacement   Hyperlipidemia Lab Results  Component Value Date   LDLCALC 106 (H) 06/21/2024   uncontrolled, pt for lower chol diet, declines statin   Hyperglycemia Lab Results  Component Value Date   HGBA1C 6.2 06/21/2024   Stable, pt to continue current medical treatment  - diet, wt control   Essential hypertension BP Readings from Last 3 Encounters:  06/21/24 (!) 188/102  02/02/24 132/78  11/30/23 128/84   Uncontrolled, likely reactive,, pt to continue medical treatment norvasc  5 every day, hydralazine  100 tid   Urine frequency Also for ua and cx  Acute URI Mod to severe sinusitis, for antibx course levaquin  500 mg qd,  to  f/u any worsening symptoms or concerns, also rocephin  1 gm in light of sinusitis and probable uti  Followup: Return if symptoms worsen or fail to improve.  Lynwood Rush, MD 06/21/2024 7:46 PM [facility]

## 2024-06-21 NOTE — Assessment & Plan Note (Signed)
 BP Readings from Last 3 Encounters:  06/21/24 (!) 188/102  02/02/24 132/78  11/30/23 128/84   Uncontrolled, likely reactive,, pt to continue medical treatment norvasc  5 every day, hydralazine  100 tid

## 2024-06-22 ENCOUNTER — Other Ambulatory Visit: Payer: Self-pay | Admitting: Family Medicine

## 2024-06-22 ENCOUNTER — Other Ambulatory Visit (HOSPITAL_COMMUNITY): Payer: Self-pay

## 2024-06-22 ENCOUNTER — Other Ambulatory Visit: Payer: Self-pay

## 2024-06-22 MED ORDER — LEVOFLOXACIN 500 MG PO TABS
500.0000 mg | ORAL_TABLET | Freq: Every day | ORAL | 0 refills | Status: AC
Start: 2024-06-22 — End: 2024-06-29
  Filled 2024-06-22: qty 7, 7d supply, fill #0

## 2024-06-22 NOTE — Progress Notes (Signed)
 Levaquin  rx sent per PCP note.  [place] [facility]

## 2024-06-23 LAB — URINE CULTURE

## 2024-06-28 NOTE — Telephone Encounter (Signed)
 Lab results printed and will be sent today. Also will attempt to call patient later today.

## 2024-06-28 NOTE — Telephone Encounter (Signed)
 Reviewed results with patient. She had informed me that symptoms were better but she was still having some of the back/flank pain from before. She was wanting to know if you would recommend a prescription antibiotic or a continuation of the prednisone ?

## 2024-06-29 ENCOUNTER — Other Ambulatory Visit (HOSPITAL_COMMUNITY): Payer: Self-pay

## 2024-06-29 ENCOUNTER — Other Ambulatory Visit: Payer: Self-pay | Admitting: Internal Medicine

## 2024-07-02 ENCOUNTER — Other Ambulatory Visit (HOSPITAL_COMMUNITY): Payer: Self-pay

## 2024-07-04 ENCOUNTER — Other Ambulatory Visit (HOSPITAL_COMMUNITY): Payer: Self-pay

## 2024-07-05 ENCOUNTER — Other Ambulatory Visit (HOSPITAL_COMMUNITY): Payer: Self-pay

## 2024-07-06 ENCOUNTER — Other Ambulatory Visit (HOSPITAL_COMMUNITY): Payer: Self-pay

## 2024-07-25 ENCOUNTER — Other Ambulatory Visit (HOSPITAL_COMMUNITY): Payer: Self-pay

## 2024-08-09 ENCOUNTER — Ambulatory Visit: Payer: Self-pay | Admitting: Internal Medicine

## 2024-08-09 ENCOUNTER — Other Ambulatory Visit (HOSPITAL_COMMUNITY): Payer: Self-pay

## 2024-08-09 ENCOUNTER — Encounter: Payer: Self-pay | Admitting: Internal Medicine

## 2024-08-09 ENCOUNTER — Ambulatory Visit: Admitting: Internal Medicine

## 2024-08-09 ENCOUNTER — Other Ambulatory Visit: Payer: Self-pay

## 2024-08-09 ENCOUNTER — Ambulatory Visit (INDEPENDENT_AMBULATORY_CARE_PROVIDER_SITE_OTHER)

## 2024-08-09 VITALS — BP 178/112 | HR 63 | Temp 97.7°F | Ht 64.0 in | Wt 120.6 lb

## 2024-08-09 DIAGNOSIS — E538 Deficiency of other specified B group vitamins: Secondary | ICD-10-CM

## 2024-08-09 DIAGNOSIS — E785 Hyperlipidemia, unspecified: Secondary | ICD-10-CM | POA: Diagnosis not present

## 2024-08-09 DIAGNOSIS — I10 Essential (primary) hypertension: Secondary | ICD-10-CM | POA: Diagnosis not present

## 2024-08-09 DIAGNOSIS — R058 Other specified cough: Secondary | ICD-10-CM

## 2024-08-09 DIAGNOSIS — E559 Vitamin D deficiency, unspecified: Secondary | ICD-10-CM

## 2024-08-09 DIAGNOSIS — R062 Wheezing: Secondary | ICD-10-CM | POA: Diagnosis not present

## 2024-08-09 DIAGNOSIS — F32A Depression, unspecified: Secondary | ICD-10-CM | POA: Diagnosis not present

## 2024-08-09 DIAGNOSIS — R739 Hyperglycemia, unspecified: Secondary | ICD-10-CM

## 2024-08-09 DIAGNOSIS — R918 Other nonspecific abnormal finding of lung field: Secondary | ICD-10-CM | POA: Diagnosis not present

## 2024-08-09 DIAGNOSIS — R0602 Shortness of breath: Secondary | ICD-10-CM | POA: Diagnosis not present

## 2024-08-09 DIAGNOSIS — J441 Chronic obstructive pulmonary disease with (acute) exacerbation: Secondary | ICD-10-CM | POA: Diagnosis not present

## 2024-08-09 DIAGNOSIS — J449 Chronic obstructive pulmonary disease, unspecified: Secondary | ICD-10-CM | POA: Diagnosis not present

## 2024-08-09 LAB — CBC WITH DIFFERENTIAL/PLATELET
Basophils Absolute: 0.1 K/uL (ref 0.0–0.1)
Basophils Relative: 1 % (ref 0.0–3.0)
Eosinophils Absolute: 0.3 K/uL (ref 0.0–0.7)
Eosinophils Relative: 5.2 % — ABNORMAL HIGH (ref 0.0–5.0)
HCT: 36.2 % (ref 36.0–46.0)
Hemoglobin: 12.1 g/dL (ref 12.0–15.0)
Lymphocytes Relative: 27.7 % (ref 12.0–46.0)
Lymphs Abs: 1.7 K/uL (ref 0.7–4.0)
MCHC: 33.5 g/dL (ref 30.0–36.0)
MCV: 85.7 fl (ref 78.0–100.0)
Monocytes Absolute: 0.5 K/uL (ref 0.1–1.0)
Monocytes Relative: 8.8 % (ref 3.0–12.0)
Neutro Abs: 3.5 K/uL (ref 1.4–7.7)
Neutrophils Relative %: 57.3 % (ref 43.0–77.0)
Platelets: 263 K/uL (ref 150.0–400.0)
RBC: 4.22 Mil/uL (ref 3.87–5.11)
RDW: 14 % (ref 11.5–15.5)
WBC: 6.1 K/uL (ref 4.0–10.5)

## 2024-08-09 LAB — BASIC METABOLIC PANEL WITH GFR
BUN: 24 mg/dL — ABNORMAL HIGH (ref 6–23)
CO2: 25 meq/L (ref 19–32)
Calcium: 9.3 mg/dL (ref 8.4–10.5)
Chloride: 105 meq/L (ref 96–112)
Creatinine, Ser: 1.67 mg/dL — ABNORMAL HIGH (ref 0.40–1.20)
GFR: 30.28 mL/min — ABNORMAL LOW (ref >60.00)
Glucose, Bld: 91 mg/dL (ref 70–99)
Potassium: 3.9 meq/L (ref 3.5–5.1)
Sodium: 137 meq/L (ref 135–145)

## 2024-08-09 LAB — URINALYSIS, ROUTINE W REFLEX MICROSCOPIC
Bilirubin Urine: NEGATIVE
Ketones, ur: NEGATIVE
Nitrite: NEGATIVE
Specific Gravity, Urine: 1.01 (ref 1.000–1.030)
Total Protein, Urine: NEGATIVE
Urine Glucose: NEGATIVE
Urobilinogen, UA: 0.2 (ref 0.0–1.0)
pH: 6 (ref 5.0–8.0)

## 2024-08-09 LAB — HEPATIC FUNCTION PANEL
ALT: 11 U/L (ref 0–35)
AST: 18 U/L (ref 0–37)
Albumin: 4 g/dL (ref 3.5–5.2)
Alkaline Phosphatase: 82 U/L (ref 39–117)
Bilirubin, Direct: 0.1 mg/dL (ref 0.0–0.3)
Total Bilirubin: 0.4 mg/dL (ref 0.2–1.2)
Total Protein: 6.7 g/dL (ref 6.0–8.3)

## 2024-08-09 LAB — LIPID PANEL
Cholesterol: 193 mg/dL (ref 0–200)
HDL: 70.6 mg/dL (ref >39.00)
LDL Cholesterol: 83 mg/dL (ref 0–99)
NonHDL: 122.36
Total CHOL/HDL Ratio: 3
Triglycerides: 197 mg/dL — ABNORMAL HIGH (ref 0.0–149.0)
VLDL: 39.4 mg/dL (ref 0.0–40.0)

## 2024-08-09 LAB — HEMOGLOBIN A1C: Hgb A1c MFr Bld: 6.1 % (ref 4.6–6.5)

## 2024-08-09 LAB — VITAMIN D 25 HYDROXY (VIT D DEFICIENCY, FRACTURES): VITD: 85.28 ng/mL (ref 30.00–100.00)

## 2024-08-09 LAB — VITAMIN B12: Vitamin B-12: >1500 pg/mL — ABNORMAL HIGH (ref 211–911)

## 2024-08-09 LAB — TSH: TSH: 1.2 u[IU]/mL (ref 0.35–5.50)

## 2024-08-09 MED ORDER — TRAMADOL HCL 50 MG PO TABS
50.0000 mg | ORAL_TABLET | Freq: Four times a day (QID) | ORAL | 0 refills | Status: DC | PRN
  Filled 2024-08-09: qty 60, 15d supply, fill #0

## 2024-08-09 MED ORDER — PREDNISONE 10 MG PO TABS
10.0000 mg | ORAL_TABLET | Freq: Every day | ORAL | 0 refills | Status: DC
  Filled 2024-08-09: qty 30, 30d supply, fill #0
  Filled 2024-08-09: qty 30, 12d supply, fill #0

## 2024-08-09 MED ORDER — AZITHROMYCIN 250 MG PO TABS
ORAL_TABLET | ORAL | 1 refills | Status: AC
  Filled 2024-08-09: qty 6, 5d supply, fill #0
  Filled 2024-08-31: qty 6, 5d supply, fill #1

## 2024-08-09 MED ORDER — FLUTICASONE-SALMETEROL 250-50 MCG/ACT IN AEPB
1.0000 | INHALATION_SPRAY | Freq: Two times a day (BID) | RESPIRATORY_TRACT | 3 refills | Status: DC | PRN
  Filled 2024-08-09: qty 180, 90d supply, fill #0

## 2024-08-09 MED ORDER — AMLODIPINE BESYLATE 10 MG PO TABS
10.0000 mg | ORAL_TABLET | Freq: Every day | ORAL | 3 refills | Status: DC
  Filled 2024-08-09: qty 90, 90d supply, fill #0

## 2024-08-09 MED ORDER — ALPRAZOLAM 0.5 MG PO TABS
0.5000 mg | ORAL_TABLET | Freq: Two times a day (BID) | ORAL | 2 refills | Status: DC | PRN
  Filled 2024-08-09: qty 60, 30d supply, fill #0
  Filled 2024-09-14: qty 60, 30d supply, fill #1

## 2024-08-09 MED ORDER — PANTOPRAZOLE SODIUM 40 MG PO TBEC
40.0000 mg | DELAYED_RELEASE_TABLET | Freq: Every day | ORAL | 3 refills | Status: DC
  Filled 2024-08-09: qty 90, 90d supply, fill #0

## 2024-08-09 MED ORDER — AMPHETAMINE-DEXTROAMPHETAMINE 30 MG PO TABS
30.0000 mg | ORAL_TABLET | Freq: Two times a day (BID) | ORAL | 0 refills | Status: DC
  Filled 2024-08-09: qty 60, 30d supply, fill #0

## 2024-08-09 MED ORDER — ALBUTEROL SULFATE HFA 108 (90 BASE) MCG/ACT IN AERS
2.0000 | INHALATION_SPRAY | Freq: Four times a day (QID) | RESPIRATORY_TRACT | 11 refills | Status: DC | PRN
  Filled 2024-08-09: qty 6.7, 25d supply, fill #0

## 2024-08-09 NOTE — Progress Notes (Signed)
 Patient ID: Kathy Vargas, female   DOB: 07-10-1951, 73 y.o.   MRN: [id number]        Chief Complaint: follow up copd exacerbation, low vit d, hld, hyperglycemia,        HPI:  Kathy Vargas is a 73 y.o. female Here with acute onset mild to mod 2-3 days ST, HA, general weakness and malaise, with prod cough greenish sputum, but Pt denies chest pain, increased sob or doe, wheezing, orthopnea, PND, increased LE swelling, palpitations, dizziness or syncope.  Except for mild sob doe wheezing since yesterday, also fatigue and intermittent hoarseness.  Still working full time 32 hr/s wk but trying to cut back to 3 days per wk.  Did have an IV tx recently on her own for vitamins and magnesium  and seemed to help. Had 2 NAD shots.   Wt Readings from Last 3 Encounters:  08/09/24 120 lb 9.6 oz (54.7 kg)  02/02/24 125 lb (56.7 kg)  11/30/23 122 lb (55.3 kg)   BP Readings from Last 3 Encounters:  08/09/24 (!) 178/112  06/21/24 (!) 188/102  02/02/24 132/78         Past Medical History:  Diagnosis Date   ABDOMINAL TENDERNESS 01/21/2011   ACUTE GINGIVITIS NONPLAQUE INDUCED 06/11/2010   ADD 09/05/2008   Alcohol abuse, in remission 09/27/2007   ALLERGIC RHINITIS 03/02/2009   Anemia    ANXIETY 07/02/2007   BACK PAIN 05/19/2009   CHRONIC OBSTRUCTIVE PULMONARY DISEASE, ACUTE EXACERBATION 08/06/2009   COPD 07/02/2007   DEGENERATIVE JOINT DISEASE, CERVICAL SPINE 07/02/2007   DEPRESSION 07/02/2007   Dyspnea    GASTROENTERITIS, VIRAL 11/29/2010   GERD 01/21/2011   Headache(784.0) 07/02/2007   History of kidney stones    Hypertension    HYPOTHYROIDISM 07/02/2007   Lumbar disc disease    Lumbar pain with radiation down right leg 06/01/2011   OSTEOARTHRITIS 07/02/2007   Other and unspecified ovarian cyst 05/19/2009   PAIN, CHRONIC, DUE TO TRAUMA 07/02/2007   PELVIC  PAIN 03/29/2010   UTI 05/19/2009   Vitamin D  insufficiency    Past Surgical History:  Procedure Laterality Date   ABDOMINAL  HYSTERECTOMY     CATARACT EXTRACTION W/ INTRAOCULAR LENS IMPLANT Bilateral    CHOLECYSTECTOMY     CYSTOSCOPY/URETEROSCOPY/HOLMIUM LASER/STENT PLACEMENT Left 07/01/2022   Procedure: CYSTOSCOPY/ RETROGRADE/URETEROSCOPY/STENT PLACEMENT;  Surgeon: Selma Donnice SAUNDERS, MD;  Location: [facility];  Service: Urology;  Laterality: Left;   eyebrow lift     IR URETERAL STENT LEFT NEW ACCESS W/O SEP NEPHROSTOMY CATH  07/01/2022   LIPOSUCTION     LUMBAR DISC SURGERY     L4, L5   NEPHROLITHOTOMY Left 07/01/2022   Procedure: NEPHROLITHOTOMY PERCUTANEOUS/ INTERVENTIONAL RADIOLOGY TO PLACE LEFT NEPHROSTOMY TUBE PRIOR;  Surgeon: Selma Donnice SAUNDERS, MD;  Location: [facility];  Service: Urology;  Laterality: Left;  ONLY NEEDS 120 MIN FOR ALL PROCEDURES   URETEROSCOPY WITH HOLMIUM LASER LITHOTRIPSY Right 04/12/2022   Procedure: URETEROSCOPY WITH HOLMIUM LASER LITHOTRIPSY, STENT PLACEMENT;  Surgeon: Lovie Arlyss CROME, MD;  Location: [facility];  Service: Urology;  Laterality: Right;    reports that she has quit smoking. Her smoking use included cigarettes. She started smoking about 61 years ago. She has a 30.9 pack-year smoking history. She has never used smokeless tobacco. She reports that she does not currently use alcohol. She reports that she does not use drugs. family history includes Alcohol abuse in an other family member; Anxiety disorder in an other family member; Coronary artery disease in an other  family member; Depression in an other family member; Stroke in an other family member. Allergies  Allergen Reactions   Morphine Nausea And Vomiting   Ceftin  [Cefuroxime ]     Nausea and vomiting   Current Outpatient Medications on File Prior to Visit  Medication Sig Dispense Refill   Cholecalciferol (VITAMIN D3 PO) Take 1 tablet by mouth in the morning.     diclofenac  Sodium (VOLTAREN ) 1 % GEL Apply 2 g topically 4 (four) times daily. 100 g 0   fluticasone  (CUTIVATE ) 0.005 % ointment Apply to the skin 2 times daily if needed 30 g 1    hydrALAZINE  (APRESOLINE ) 100 MG tablet Take 1 tablet (100 mg total) by mouth 3 (three) times daily. 90 tablet 11   loratadine  (CLARITIN ) 10 MG tablet Take 1 tablet (10 mg total) by mouth daily.     nicotine  (NICODERM CQ  - DOSED IN MG/24 HOURS) 21 mg/24hr patch Place 1 patch (21 mg total) onto the skin daily. 14 patch 0   senna-docusate (SENOKOT-S) 8.6-50 MG tablet Take 2 tablets by mouth 2 (two) times daily.     acetaminophen  (TYLENOL ) 325 MG tablet Take 1-2 tablets (325-650 mg total) by mouth every 4 (four) hours as needed for mild pain (pain score 1-3). (Patient not taking: Reported on 08/09/2024)     Biotin w/ Vitamins C & E (HAIR/SKIN/NAILS PO) Take 1 tablet by mouth daily. (Patient not taking: Reported on 08/09/2024)     Calcium -Magnesium -Zinc (CAL-MAG-ZINC PO) Take 1 tablet by mouth at bedtime. (Patient not taking: Reported on 08/09/2024)     cyanocobalamin  (VITAMIN B12) 1000 MCG tablet Take 1 tablet (1,000 mcg total) by mouth daily. (Patient not taking: Reported on 08/09/2024) 90 tablet 3   [DISCONTINUED] FLUoxetine  (PROZAC ) 20 MG capsule Take 1 capsule (20 mg total) by mouth daily. 30 capsule 11   No current facility-administered medications on file prior to visit.        ROS:  All others reviewed and negative.  Objective        PE:  BP (!) 178/112   Pulse 63   Temp 97.7 F (36.5 C)   Ht 5' 4 (1.626 m)   Wt 120 lb 9.6 oz (54.7 kg)   SpO2 98%   BMI 20.70 kg/m                 Constitutional: Pt appears mild ill               HENT: Head: NCAT.                Right Ear: External ear normal.                 Left Ear: External ear normal. Bilat tm's with mild erythema.  Max sinus areas non tender.  Pharynx with mild erythema, no exudate               Eyes: . Pupils are equal, round, and reactive to light. Conjunctivae and EOM are normal               Nose: without d/c or deformity               Neck: Neck supple. Gross normal ROM               Cardiovascular: Normal rate and  regular rhythm.                 Pulmonary/Chest: Effort normal and breath sounds decreased without rales  but with few bilateral wheezing.                               Neurological: Pt is alert. At baseline orientation, motor grossly intact               Skin: Skin is warm. No rashes, no other new lesions, LE edema - none               Psychiatric: Pt behavior is normal without agitation   Micro: none  Cardiac tracings I have personally interpreted today:  none  Pertinent Radiological findings (summarize): none   Lab Results  Component Value Date   WBC 6.1 08/09/2024   HGB 12.1 08/09/2024   HCT 36.2 08/09/2024   PLT 263.0 08/09/2024   GLUCOSE 91 08/09/2024   CHOL 193 08/09/2024   TRIG 197.0 (H) 08/09/2024   HDL 70.60 08/09/2024   LDLDIRECT 121.4 12/02/2010   LDLCALC 83 08/09/2024   ALT 11 08/09/2024   AST 18 08/09/2024   NA 137 08/09/2024   K 3.9 08/09/2024   CL 105 08/09/2024   CREATININE 1.67 (H) 08/09/2024   BUN 24 (H) 08/09/2024   CO2 25 08/09/2024   TSH 1.20 08/09/2024   INR 0.9 10/10/2023   HGBA1C 6.1 08/09/2024   Assessment/Plan:  Kathy Vargas is a 73 y.o. White or Caucasian [1] female with  has a past medical history of ABDOMINAL TENDERNESS (01/21/2011), ACUTE GINGIVITIS NONPLAQUE INDUCED (06/11/2010), ADD (09/05/2008), Alcohol abuse, in remission (09/27/2007), ALLERGIC RHINITIS (03/02/2009), Anemia, ANXIETY (07/02/2007), BACK PAIN (05/19/2009), CHRONIC OBSTRUCTIVE PULMONARY DISEASE, ACUTE EXACERBATION (08/06/2009), COPD (07/02/2007), DEGENERATIVE JOINT DISEASE, CERVICAL SPINE (07/02/2007), DEPRESSION (07/02/2007), Dyspnea, GASTROENTERITIS, VIRAL (11/29/2010), GERD (01/21/2011), Headache(784.0) (07/02/2007), History of kidney stones, Hypertension, HYPOTHYROIDISM (07/02/2007), Lumbar disc disease, Lumbar pain with radiation down right leg (06/01/2011), OSTEOARTHRITIS (07/02/2007), Other and unspecified ovarian cyst (05/19/2009), PAIN, CHRONIC, DUE TO TRAUMA  (07/02/2007), PELVIC  PAIN (03/29/2010), UTI (05/19/2009), and Vitamin D  insufficiency.  Vitamin D  deficiency Last vitamin D  Lab Results  Component Value Date   VD25OH 85.28 08/09/2024   Stable, cont oral replacement   Hyperlipidemia Lab Results  Component Value Date   LDLCALC 83 08/09/2024   Stable, pt to continue low chol diet, declines statin   Hyperglycemia Lab Results  Component Value Date   HGBA1C 6.1 08/09/2024   Stable, pt to continue current medical treatment  - diet,wt control   COPD exacerbation (HCC) Mild to mod, for cxr, for antibx course zpack, prednisone , restart advair , continue albuterol  hfa prn,  to f/u any worsening symptoms or concerns  Essential hypertension BP Readings from Last 3 Encounters:  06/21/24 (!) 188/102  02/02/24 132/78  11/30/23 128/84   Severe uncontrolled, likely reactive in part, pt to restart medical treatment norvasc  10 qd   Depression Pt states mild currently, does not want tx  Followup: No follow-ups on file.  Lynwood Rush, MD 08/10/2024 5:13 PM [facility]

## 2024-08-09 NOTE — Patient Instructions (Addendum)
 Please take all new medication as prescribed - the antibiotic and prednisone   Ok to increase the amlodipine  to 10 mg per day due to the high BP today  Please have your Shingrix (shingles) shots done at your local pharmacy., and the Tdap tetanus shot as well  Please continue all other medications as before, and refills have been done if requested.  Please have the pharmacy call with any other refills you may need.  Please continue your efforts at being more active, low cholesterol diet, and weight control.  You are otherwise up to date with prevention measures today.  Please keep your appointments with your specialists as you may have planned  Please go to the XRAY Department in the first floor for the x-ray testing  Please go to the LAB at the blood drawing area for the tests to be done   You will be contacted by phone if any changes need to be made immediately.  Otherwise, you will receive a letter about your results with an explanation, but please check with MyChart first.  Please make an Appointment to return in 6 months, or sooner if needed

## 2024-08-10 ENCOUNTER — Encounter: Payer: Self-pay | Admitting: Internal Medicine

## 2024-08-10 NOTE — Assessment & Plan Note (Signed)
 Pt states mild currently, does not want tx

## 2024-08-10 NOTE — Assessment & Plan Note (Signed)
 Lab Results  Component Value Date   HGBA1C 6.1 08/09/2024   Stable, pt to continue current medical treatment  - diet,wt control

## 2024-08-10 NOTE — Assessment & Plan Note (Signed)
 Last vitamin D  Lab Results  Component Value Date   VD25OH 85.28 08/09/2024   Stable, cont oral replacement

## 2024-08-10 NOTE — Assessment & Plan Note (Signed)
 Lab Results  Component Value Date   LDLCALC 83 08/09/2024   Stable, pt to continue low chol diet, declines statin

## 2024-08-10 NOTE — Assessment & Plan Note (Signed)
 Mild to mod, for cxr, for antibx course zpack, prednisone , restart advair , continue albuterol  hfa prn,  to f/u any worsening symptoms or concerns

## 2024-08-10 NOTE — Assessment & Plan Note (Addendum)
 BP Readings from Last 3 Encounters:  06/21/24 (!) 188/102  02/02/24 132/78  11/30/23 128/84   Severe uncontrolled, likely reactive in part, pt to restart medical treatment norvasc  10 qd

## 2024-08-19 ENCOUNTER — Other Ambulatory Visit (HOSPITAL_COMMUNITY): Payer: Self-pay

## 2024-08-31 ENCOUNTER — Other Ambulatory Visit (HOSPITAL_COMMUNITY): Payer: Self-pay

## 2024-09-14 ENCOUNTER — Other Ambulatory Visit (HOSPITAL_COMMUNITY): Payer: Self-pay

## 2024-09-14 ENCOUNTER — Other Ambulatory Visit: Payer: Self-pay | Admitting: Internal Medicine

## 2024-09-16 ENCOUNTER — Other Ambulatory Visit (HOSPITAL_COMMUNITY): Payer: Self-pay

## 2024-09-16 MED ORDER — AMPHETAMINE-DEXTROAMPHETAMINE 30 MG PO TABS
30.0000 mg | ORAL_TABLET | Freq: Two times a day (BID) | ORAL | 0 refills | Status: DC
  Filled 2024-09-16: qty 60, 30d supply, fill #0

## 2024-09-18 ENCOUNTER — Other Ambulatory Visit (HOSPITAL_COMMUNITY): Payer: Self-pay

## 2024-10-18 ENCOUNTER — Other Ambulatory Visit (HOSPITAL_COMMUNITY): Payer: Self-pay

## 2024-10-18 ENCOUNTER — Ambulatory Visit (HOSPITAL_COMMUNITY)
Admission: EM | Admit: 2024-10-18 | Discharge: 2024-10-18 | Disposition: A | Discharge status: Home / Self Care | Attending: Emergency Medicine | Admitting: Emergency Medicine

## 2024-10-18 ENCOUNTER — Encounter (HOSPITAL_COMMUNITY): Payer: Self-pay | Admitting: Emergency Medicine

## 2024-10-18 DIAGNOSIS — R21 Rash and other nonspecific skin eruption: Secondary | ICD-10-CM | POA: Diagnosis not present

## 2024-10-18 DIAGNOSIS — L259 Unspecified contact dermatitis, unspecified cause: Secondary | ICD-10-CM | POA: Diagnosis not present

## 2024-10-18 DIAGNOSIS — I10 Essential (primary) hypertension: Secondary | ICD-10-CM | POA: Diagnosis not present

## 2024-10-18 MED ORDER — DEXAMETHASONE SOD PHOSPHATE PF 10 MG/ML IJ SOLN
10.0000 mg | Freq: Once | INTRAMUSCULAR | Status: AC
  Administered 2024-10-18: 10 mg via INTRAMUSCULAR

## 2024-10-18 MED ORDER — PREDNISONE 20 MG PO TABS
40.0000 mg | ORAL_TABLET | Freq: Every day | ORAL | 0 refills | Status: AC
  Filled 2024-10-18: qty 10, 5d supply, fill #0

## 2024-10-18 NOTE — ED Triage Notes (Signed)
 Rash stated on patient face but has cleaned up from using cream however it has spread to nape of her neck and chest and its going behind her ears cream with not work for it. Patient was taking benadryl  but has not taken any today

## 2024-10-18 NOTE — ED Provider Notes (Addendum)
 [facility]    CSN: [id number] Arrival date & time: 10/18/24  1236      History   Chief Complaint Chief Complaint  Patient presents with   Rash    HPI Kathy Vargas is a 73 y.o. female.   Patient presents with a rash to her neck and chest that has worsened over the last week.  Patient states that she initially had a rash to the lower part of her face that resolved with hydrocortisone, but then states that the rash spread to the back of her neck and down her chest and she has been applying hydrocortisone cream without relief.  Patient states that she is also been taking Benadryl  without relief.  Patient is requesting a steroid shot for her rash.  Of note patient's blood pressure is elevated in clinic today.  Patient attributes this to being very itchy and anxious due to the rash.  The history is provided by the patient and medical records.  Rash   Past Medical History:  Diagnosis Date   ABDOMINAL TENDERNESS 01/21/2011   ACUTE GINGIVITIS NONPLAQUE INDUCED 06/11/2010   ADD 09/05/2008   Alcohol abuse, in remission 09/27/2007   ALLERGIC RHINITIS 03/02/2009   Anemia    ANXIETY 07/02/2007   BACK PAIN 05/19/2009   CHRONIC OBSTRUCTIVE PULMONARY DISEASE, ACUTE EXACERBATION 08/06/2009   COPD 07/02/2007   DEGENERATIVE JOINT DISEASE, CERVICAL SPINE 07/02/2007   DEPRESSION 07/02/2007   Dyspnea    GASTROENTERITIS, VIRAL 11/29/2010   GERD 01/21/2011   Headache(784.0) 07/02/2007   History of kidney stones    Hypertension    HYPOTHYROIDISM 07/02/2007   Lumbar disc disease    Lumbar pain with radiation down right leg 06/01/2011   OSTEOARTHRITIS 07/02/2007   Other and unspecified ovarian cyst 05/19/2009   PAIN, CHRONIC, DUE TO TRAUMA 07/02/2007   PELVIC  PAIN 03/29/2010   UTI 05/19/2009   Vitamin D  insufficiency     Patient Active Problem List   Diagnosis Date Noted   Urine frequency 06/21/2024   Chronic kidney disease 10/11/2023   Anxiety 10/11/2023   ICH  (intracerebral hemorrhage) (HCC) 10/10/2023   Epigastric pain 04/16/2023   Left renal stone 07/01/2022   Recurrent UTI 01/07/2022   Essential hypertension 02/14/2021   Right otitis media 07/01/2020   Acute URI 02/04/2019   Fever 08/15/2017   Left low back pain 05/30/2017   Hyperlipidemia 05/06/2016   Hyperglycemia 05/06/2016   Venous (peripheral) insufficiency 05/06/2016   COPD exacerbation (HCC) 04/15/2015   Insomnia 04/15/2015   Fatigue 10/15/2014   Smoker 10/15/2014   Chronic pain syndrome 02/21/2014   Cough 12/25/2013   Menopausal problem 02/09/2012   Lumbar pain with radiation down right leg 06/01/2011   Encounter for well adult exam with abnormal findings 05/29/2011   GERD 01/21/2011   Vitamin D  deficiency 11/29/2010   ACUTE GINGIVITIS NONPLAQUE INDUCED 06/11/2010   PELVIC  PAIN 03/29/2010   DISC DISEASE, LUMBAR 08/06/2009   Other and unspecified ovarian cyst 05/19/2009   Allergic rhinitis 03/02/2009   Attention deficit disorder 09/05/2008   Alcohol abuse, in remission 09/27/2007   Hypothyroidism 07/02/2007   Depression 07/02/2007   PAIN, CHRONIC, DUE TO TRAUMA 07/02/2007   COPD (chronic obstructive pulmonary disease) (HCC) 07/02/2007   OSTEOARTHRITIS 07/02/2007   DEGENERATIVE JOINT DISEASE, CERVICAL SPINE 07/02/2007    Past Surgical History:  Procedure Laterality Date   ABDOMINAL HYSTERECTOMY     CATARACT EXTRACTION W/ INTRAOCULAR LENS IMPLANT Bilateral    CHOLECYSTECTOMY     CYSTOSCOPY/URETEROSCOPY/HOLMIUM  LASER/STENT PLACEMENT Left 07/01/2022   Procedure: CYSTOSCOPY/ RETROGRADE/URETEROSCOPY/STENT PLACEMENT;  Surgeon: Selma Donnice SAUNDERS, MD;  Location: [facility];  Service: Urology;  Laterality: Left;   eyebrow lift     IR URETERAL STENT LEFT NEW ACCESS W/O SEP NEPHROSTOMY CATH  07/01/2022   LIPOSUCTION     LUMBAR DISC SURGERY     L4, L5   NEPHROLITHOTOMY Left 07/01/2022   Procedure: NEPHROLITHOTOMY PERCUTANEOUS/ INTERVENTIONAL RADIOLOGY TO PLACE LEFT NEPHROSTOMY TUBE  PRIOR;  Surgeon: Selma Donnice SAUNDERS, MD;  Location: [facility];  Service: Urology;  Laterality: Left;  ONLY NEEDS 120 MIN FOR ALL PROCEDURES   URETEROSCOPY WITH HOLMIUM LASER LITHOTRIPSY Right 04/12/2022   Procedure: URETEROSCOPY WITH HOLMIUM LASER LITHOTRIPSY, STENT PLACEMENT;  Surgeon: Lovie Arlyss CROME, MD;  Location: [facility];  Service: Urology;  Laterality: Right;    OB History   No obstetric history on file.      Home Medications    Prior to Admission medications   Medication Sig Start Date End Date Taking? Authorizing Provider  predniSONE  (DELTASONE ) 20 MG tablet Take 2 tablets (40 mg total) by mouth daily for 5 days. 10/18/24 10/23/24 Yes Johnie Flaming A, NP  acetaminophen  (TYLENOL ) 325 MG tablet Take 1-2 tablets (325-650 mg total) by mouth every 4 (four) hours as needed for mild pain (pain score 1-3). Patient not taking: Reported on 08/09/2024 10/20/23   Setzer, Sandra J, PA-C  albuterol  (VENTOLIN  HFA) 108 (90 Base) MCG/ACT inhaler Inhale 2 puffs into the lungs every 6 (six) hours as needed for wheezing or shortness of breath. 08/09/24   Norleen Lynwood ORN, MD  ALPRAZolam  (XANAX ) 0.5 MG tablet Take 1 tablet (0.5 mg) by mouth 2 times daily as needed. 08/09/24   Norleen Lynwood ORN, MD  amLODipine  (NORVASC ) 10 MG tablet Take 1 tablet (10 mg total) by mouth daily. 08/09/24   Norleen Lynwood ORN, MD  amphetamine -dextroamphetamine  (ADDERALL ) 30 MG tablet Take 1 tablet by mouth 2 (two) times daily. 09/16/24   Rollene Almarie LABOR, MD  Biotin w/ Vitamins C & E (HAIR/SKIN/NAILS PO) Take 1 tablet by mouth daily. Patient not taking: Reported on 08/09/2024    [provider]  Calcium -Magnesium -Zinc (CAL-MAG-ZINC PO) Take 1 tablet by mouth at bedtime. Patient not taking: Reported on 08/09/2024    [provider]  Cholecalciferol (VITAMIN D3 PO) Take 1 tablet by mouth in the morning.    [provider]  cyanocobalamin  (VITAMIN B12) 1000 MCG tablet Take 1 tablet (1,000 mcg total) by mouth  daily. Patient not taking: Reported on 08/09/2024 04/21/23   Norleen Lynwood ORN, MD  diclofenac  Sodium (VOLTAREN ) 1 % GEL Apply 2 g topically 4 (four) times daily. 10/20/23   Setzer, Nena PARAS, PA-C  fluticasone -salmeterol (ADVAIR  DISKUS) 250-50 MCG/ACT AEPB Inhale 1 puff into the lungs 2 (two) times daily as needed (Asthma). 08/09/24   Norleen Lynwood ORN, MD  hydrALAZINE  (APRESOLINE ) 100 MG tablet Take 1 tablet (100 mg total) by mouth 3 (three) times daily. 11/03/23   Norleen Lynwood ORN, MD  loratadine  (CLARITIN ) 10 MG tablet Take 1 tablet (10 mg total) by mouth daily. 10/20/23   Setzer, Nena PARAS, PA-C  pantoprazole  (PROTONIX ) 40 MG tablet Take 1 tablet (40 mg total) by mouth daily. 08/09/24   Norleen Lynwood ORN, MD  senna-docusate (SENOKOT-S) 8.6-50 MG tablet Take 2 tablets by mouth 2 (two) times daily. 10/20/23   Setzer, Nena PARAS, PA-C  FLUoxetine  (PROZAC ) 20 MG capsule Take 1 capsule (20 mg total) by mouth daily. 09/07/11 02/09/12  Norleen Lynwood ORN, MD    Family History Family History  Problem Relation Age of Onset   Alcohol abuse Other    Anxiety disorder Other    Coronary artery disease Other    Depression Other    Stroke Other     Social History Social History   Tobacco Use   Smoking status: Former    Current packs/day: 0.50    Average packs/day: 0.5 packs/day for 61.9 years (31.0 ttl pk-yrs)    Types: Cigarettes    Start date: 1964   Smokeless tobacco: Never  Vaping Use   Vaping status: Never Used  Substance Use Topics   Alcohol use: Not Currently   Drug use: No     Allergies   Morphine and Ceftin  [cefuroxime ]   Review of Systems Review of Systems  Skin:  Positive for rash.   Per HPI  Physical Exam Triage Vital Signs ED Triage Vitals  Encounter Vitals Group     BP 10/18/24 1423 (!) 208/112     Girls Systolic BP Percentile --      Girls Diastolic BP Percentile --      Boys Systolic BP Percentile --      Boys Diastolic BP Percentile --      Pulse Rate 10/18/24 1423 67     Resp  10/18/24 1423 16     Temp 10/18/24 1423 97.7 F (36.5 C)     Temp Source 10/18/24 1423 Oral     SpO2 10/18/24 1423 97 %     Weight --      Height --      Head Circumference --      Peak Flow --      Pain Score 10/18/24 1419 0     Pain Loc --      Pain Education --      Exclude from Growth Chart --    No data found.  Updated Vital Signs BP (!) 208/116 (BP Location: Right Arm)   Pulse 67   Temp 97.7 F (36.5 C) (Oral)   Resp 16   SpO2 97%   Visual Acuity Right Eye Distance:   Left Eye Distance:   Bilateral Distance:    Right Eye Near:   Left Eye Near:    Bilateral Near:     Physical Exam Vitals and nursing note reviewed.  Constitutional:      General: She is awake. She is not in acute distress.    Appearance: Normal appearance. She is well-developed and well-groomed. She is not ill-appearing.  Skin:    General: Skin is warm and dry.     Findings: Rash present. Rash is macular and papular.     Comments: Diffuse maculopapular rash noted to posterior neck that extends down patient's upper chest.  Neurological:     Mental Status: She is alert.  Psychiatric:        Behavior: Behavior is cooperative.      UC Treatments / Results  Labs (all labs ordered are listed, but only abnormal results are displayed) Labs Reviewed - No data to display  EKG   Radiology No results found.  Procedures Procedures (including critical care time)  Medications Ordered in UC Medications  dexamethasone  (DECADRON ) injection 10 mg (has no administration in time range)    Initial Impression / Assessment and Plan / UC Course  I have reviewed the triage vital signs and the nursing notes.  Pertinent labs & imaging results that were available during my care of the  patient were reviewed by me and considered in my medical decision making (see chart for details).     Patient is overall well-appearing.  Vitals are overall stable, but blood pressure is elevated at 208/116.  Given IM  Decadron  in clinic per patient request.  Prescribed short prednisone  burst for additional relief of this.  Recommended continuing with hydrocortisone cream.  Discussed importance of taking blood pressure medication and following up with PCP for further management of this.  Discussed follow-up and return precautions. Final Clinical Impressions(s) / UC Diagnoses   Final diagnoses:  Rash and nonspecific skin eruption  Contact dermatitis, unspecified contact dermatitis type, unspecified trigger  Elevated blood pressure reading in office with diagnosis of hypertension     Discharge Instructions      You are given an injection of Decadron  in clinic today to help with your rash. Tomorrow start taking 2 tablets of prednisone  once daily for 5 days for additional relief of this. You can continue to apply hydrocortisone cream to the affected area as well.  Be sure that you are taking your blood pressure medication daily as prescribed.  Follow-up with your primary care provider for further evaluation of this if your blood pressure continues to be elevated. Return here as needed.   ED Prescriptions     Medication Sig Dispense Auth. Provider   predniSONE  (DELTASONE ) 20 MG tablet Take 2 tablets (40 mg total) by mouth daily for 5 days. 10 tablet Johnie Flaming A, NP      PDMP not reviewed this encounter.   Johnie Flaming LABOR, NP 10/18/24 1507    Johnie Flaming A, NP 10/18/24 [phone]

## 2024-10-18 NOTE — Discharge Instructions (Signed)
 You are given an injection of Decadron  in clinic today to help with your rash. Tomorrow start taking 2 tablets of prednisone  once daily for 5 days for additional relief of this. You can continue to apply hydrocortisone cream to the affected area as well.  Be sure that you are taking your blood pressure medication daily as prescribed.  Follow-up with your primary care provider for further evaluation of this if your blood pressure continues to be elevated. Return here as needed.

## 2024-10-19 ENCOUNTER — Other Ambulatory Visit: Payer: Self-pay | Admitting: Internal Medicine

## 2024-10-19 ENCOUNTER — Other Ambulatory Visit (HOSPITAL_COMMUNITY): Payer: Self-pay

## 2024-10-21 ENCOUNTER — Other Ambulatory Visit: Payer: Self-pay

## 2024-10-21 ENCOUNTER — Other Ambulatory Visit (HOSPITAL_COMMUNITY): Payer: Self-pay

## 2024-10-21 MED ORDER — AMPHETAMINE-DEXTROAMPHETAMINE 30 MG PO TABS
30.0000 mg | ORAL_TABLET | Freq: Two times a day (BID) | ORAL | 0 refills | Status: DC
  Filled 2024-10-21: qty 60, 30d supply, fill #0

## 2024-10-21 MED ORDER — ALPRAZOLAM 0.5 MG PO TABS
0.5000 mg | ORAL_TABLET | Freq: Two times a day (BID) | ORAL | 2 refills | Status: DC | PRN
  Filled 2024-10-21: qty 60, 30d supply, fill #0

## 2024-10-23 ENCOUNTER — Other Ambulatory Visit: Payer: Self-pay

## 2024-10-24 ENCOUNTER — Other Ambulatory Visit (HOSPITAL_COMMUNITY): Payer: Self-pay

## 2024-10-24 ENCOUNTER — Ambulatory Visit: Payer: Self-pay

## 2024-10-24 MED ORDER — TRIAMCINOLONE ACETONIDE 0.1 % EX CREA
1.0000 | TOPICAL_CREAM | Freq: Two times a day (BID) | CUTANEOUS | 0 refills | Status: AC
  Filled 2024-10-24: qty 30, 15d supply, fill #0

## 2024-10-24 NOTE — Telephone Encounter (Signed)
 FYI Only or Action Required?: Action required by provider: clinical question for provider and update on patient condition.  Patient was last seen in primary care on 08/09/2024 by Norleen Lynwood ORN, MD.  Called Nurse Triage reporting Rash.  Symptoms began a week ago.  Interventions attempted: OTC medications: benadryl  and Prescription medications: prednisone ; hydrocortisone.  Symptoms are: gradually worsening.  Triage Disposition: See Physician Within 24 Hours  Patient/caregiver understands and will follow disposition?:    Copied from CRM #[id number]. Topic: Clinical - Red Word Triage >> Oct 24, 2024  9:41 AM Roselie BROCKS wrote: [facility] that prompted transfer to Nurse Triage: Patient states she has a very painful and very itchy rash spreading from head ,neck area down to her chest. Saw urgent care last week.but is getting worse   Reason for Disposition  SEVERE itching (i.e., interferes with sleep, normal activities or school)  Answer Assessment - Initial Assessment Questions Pt called in stating that d/t closure last Friday 11/28, she went to [facility] for rash on her R ear, neck and chest. She states she was given Prednisone  5 day pack and Decadron  IM inj but now rash is back and worse than before. She states she has been also using benadryl  for the itching and hydrocortisone cream. She is applying cool compress but itching is persistent. Rash is red in appearance with red pen point bumps. Pt denies blisters, difficulty breathing or any harold patch rash appearance. Pt denies exposure to anything new, no new medications. Offered appt but pt elects to have message sent to Dr. Lynwood Norleen for rx to be sent in.    1. APPEARANCE of RASH: What does the rash look like? (e.g., blisters, dry flaky skin, red spots, redness, sores)     Red bumps; no blister formation   2. SIZE: How big are the spots? (e.g., tip of pen, eraser, coin; inches, centimeters)     Tip of a pen  3. LOCATION: Where is the rash  located?     Head, neck and chest   4. COLOR: What color is the rash? (Note: It is difficult to assess rash color in people with darker-colored skin. When this situation occurs, simply ask the caller to describe what they see.)     Red bumps that are raised   5. ONSET: When did the rash begin?     1 week ago   6. FEVER: Do you have a fever? If Yes, ask: What is your temperature, how was it measured, and when did it start?     No  7. ITCHING: Does the rash itch? If Yes, ask: How bad is the itch? (Scale 1-10; or mild, moderate, severe)     8  8. CAUSE: What do you think is causing the rash?     Unknown   9. MEDICINE FACTORS: Have you started any new medicines within the last 2 weeks? (e.g., antibiotics)      No   10. OTHER SYMPTOMS: Do you have any other symptoms? (e.g., dizziness, headache, sore throat, joint pain)       None  Protocols used: Rash or Redness - [facility]

## 2024-10-24 NOTE — Telephone Encounter (Signed)
 Ok to add a topical steroid cream - I will send.  Thanks

## 2024-10-25 ENCOUNTER — Telehealth: Payer: Self-pay

## 2024-10-25 ENCOUNTER — Other Ambulatory Visit (HOSPITAL_COMMUNITY): Payer: Self-pay

## 2024-10-25 MED ORDER — PREDNISONE 10 MG PO TABS
ORAL_TABLET | ORAL | 0 refills | Status: DC
  Filled 2024-10-25: qty 18, 9d supply, fill #0

## 2024-10-25 NOTE — Telephone Encounter (Signed)
 Copied from CRM #[id number]. Topic: Clinical - Medication Question >> Oct 25, 2024  9:27 AM Laymon HERO wrote: Reason for CRM: Patient doesn't think that the cream that was sent in to the pharmacy will work for her rash. She is wanting to know if a prescription for prednisone  can be called in instead.. please contact patient.

## 2024-10-25 NOTE — Telephone Encounter (Signed)
 Ok sure, this can be done.  thanks

## 2024-10-25 NOTE — Addendum Note (Signed)
 Addended by: NORLEEN LYNWOOD ORN on: 10/25/2024 11:30 AM   Modules accepted: Orders

## 2024-11-01 ENCOUNTER — Ambulatory Visit: Admitting: Internal Medicine

## 2024-11-01 ENCOUNTER — Other Ambulatory Visit: Payer: Self-pay

## 2024-11-01 ENCOUNTER — Encounter: Payer: Self-pay | Admitting: Internal Medicine

## 2024-11-01 ENCOUNTER — Other Ambulatory Visit (HOSPITAL_COMMUNITY): Payer: Self-pay

## 2024-11-01 VITALS — BP 128/82 | HR 76 | Temp 98.6°F | Ht 64.0 in | Wt 123.0 lb

## 2024-11-01 DIAGNOSIS — E559 Vitamin D deficiency, unspecified: Secondary | ICD-10-CM

## 2024-11-01 DIAGNOSIS — R739 Hyperglycemia, unspecified: Secondary | ICD-10-CM

## 2024-11-01 DIAGNOSIS — E785 Hyperlipidemia, unspecified: Secondary | ICD-10-CM

## 2024-11-01 DIAGNOSIS — N1831 Chronic kidney disease, stage 3a: Secondary | ICD-10-CM

## 2024-11-01 DIAGNOSIS — I10 Essential (primary) hypertension: Secondary | ICD-10-CM

## 2024-11-01 DIAGNOSIS — J309 Allergic rhinitis, unspecified: Secondary | ICD-10-CM

## 2024-11-01 DIAGNOSIS — J438 Other emphysema: Secondary | ICD-10-CM

## 2024-11-01 MED ORDER — FLUTICASONE-SALMETEROL 250-50 MCG/ACT IN AEPB
1.0000 | INHALATION_SPRAY | Freq: Two times a day (BID) | RESPIRATORY_TRACT | 3 refills | Status: AC | PRN
  Filled 2024-11-01: qty 180, 90d supply, fill #0

## 2024-11-01 MED ORDER — AMLODIPINE BESYLATE 10 MG PO TABS
10.0000 mg | ORAL_TABLET | Freq: Every day | ORAL | 3 refills | Status: AC
  Filled 2024-11-01: qty 90, 90d supply, fill #0

## 2024-11-01 MED ORDER — AMPHETAMINE-DEXTROAMPHETAMINE 30 MG PO TABS
30.0000 mg | ORAL_TABLET | Freq: Two times a day (BID) | ORAL | 0 refills | Status: DC
  Filled 2024-11-01 – 2024-12-05 (×2): qty 60, 30d supply, fill #0

## 2024-11-01 MED ORDER — ALPRAZOLAM 0.5 MG PO TABS
0.5000 mg | ORAL_TABLET | Freq: Two times a day (BID) | ORAL | 5 refills | Status: AC | PRN
  Filled 2024-11-01 – 2024-12-05 (×2): qty 60, 30d supply, fill #0

## 2024-11-01 MED ORDER — ALBUTEROL SULFATE HFA 108 (90 BASE) MCG/ACT IN AERS
2.0000 | INHALATION_SPRAY | Freq: Four times a day (QID) | RESPIRATORY_TRACT | 11 refills | Status: AC | PRN
  Filled 2024-11-01: qty 6.7, 20d supply, fill #0

## 2024-11-01 MED ORDER — HYDRALAZINE HCL 100 MG PO TABS
100.0000 mg | ORAL_TABLET | Freq: Three times a day (TID) | ORAL | 3 refills | Status: AC
  Filled 2024-11-01: qty 270, 90d supply, fill #0

## 2024-11-01 MED ORDER — PANTOPRAZOLE SODIUM 40 MG PO TBEC
40.0000 mg | DELAYED_RELEASE_TABLET | Freq: Every day | ORAL | 3 refills | Status: AC
  Filled 2024-11-01: qty 90, 90d supply, fill #0

## 2024-11-01 NOTE — Progress Notes (Signed)
 Patient ID: Kathy Vargas, female   DOB: 1951/02/19, 73 y.o.   MRN: [id number]        Chief Complaint: follow up low b12, left foot pain, ckd3a, allergies, cervical radiculitis        HPI:  Kathy Vargas is a 73 y.o. female here overall doing ok,  Pt denies chest pain, increased sob or doe, wheezing, orthopnea, PND, increased LE swelling, palpitations, dizziness or syncope.   Pt denies polydipsia, polyuria, or new focal neuro s/s.    Pt denies fever, wt loss, night sweats, loss of appetite, or other constitutional symptoms  Does have several wks ongoing nasal allergy symptoms with clearish congestion, itch and sneezing, without fever, pain, ST, cough, swelling or wheezing.  Did also have recent cervical radiculitis but resolved with prednisone .  Also with worsening left mid foot pain and swelling, worsening last few months to walk but no trauma or falls.       Wt Readings from Last 3 Encounters:  11/01/24 123 lb (55.8 kg)  08/09/24 120 lb 9.6 oz (54.7 kg)  02/02/24 125 lb (56.7 kg)   BP Readings from Last 3 Encounters:  11/01/24 128/82  10/18/24 (!) 208/116  08/09/24 (!) 178/112         Past Medical History:  Diagnosis Date   ABDOMINAL TENDERNESS 01/21/2011   ACUTE GINGIVITIS NONPLAQUE INDUCED 06/11/2010   ADD 09/05/2008   Alcohol abuse, in remission 09/27/2007   ALLERGIC RHINITIS 03/02/2009   Anemia    ANXIETY 07/02/2007   BACK PAIN 05/19/2009   CHRONIC OBSTRUCTIVE PULMONARY DISEASE, ACUTE EXACERBATION 08/06/2009   COPD 07/02/2007   DEGENERATIVE JOINT DISEASE, CERVICAL SPINE 07/02/2007   DEPRESSION 07/02/2007   Dyspnea    GASTROENTERITIS, VIRAL 11/29/2010   GERD 01/21/2011   Headache(784.0) 07/02/2007   History of kidney stones    Hypertension    HYPOTHYROIDISM 07/02/2007   Lumbar disc disease    Lumbar pain with radiation down right leg 06/01/2011   OSTEOARTHRITIS 07/02/2007   Other and unspecified ovarian cyst 05/19/2009   PAIN, CHRONIC, DUE TO TRAUMA 07/02/2007    PELVIC  PAIN 03/29/2010   UTI 05/19/2009   Vitamin D  insufficiency    Past Surgical History:  Procedure Laterality Date   ABDOMINAL HYSTERECTOMY     CATARACT EXTRACTION W/ INTRAOCULAR LENS IMPLANT Bilateral    CHOLECYSTECTOMY     CYSTOSCOPY/URETEROSCOPY/HOLMIUM LASER/STENT PLACEMENT Left 07/01/2022   Procedure: CYSTOSCOPY/ RETROGRADE/URETEROSCOPY/STENT PLACEMENT;  Surgeon: Selma Donnice SAUNDERS, MD;  Location: [facility];  Service: Urology;  Laterality: Left;   eyebrow lift     IR URETERAL STENT LEFT NEW ACCESS W/O SEP NEPHROSTOMY CATH  07/01/2022   LIPOSUCTION     LUMBAR DISC SURGERY     L4, L5   NEPHROLITHOTOMY Left 07/01/2022   Procedure: NEPHROLITHOTOMY PERCUTANEOUS/ INTERVENTIONAL RADIOLOGY TO PLACE LEFT NEPHROSTOMY TUBE PRIOR;  Surgeon: Selma Donnice SAUNDERS, MD;  Location: [facility];  Service: Urology;  Laterality: Left;  ONLY NEEDS 120 MIN FOR ALL PROCEDURES   URETEROSCOPY WITH HOLMIUM LASER LITHOTRIPSY Right 04/12/2022   Procedure: URETEROSCOPY WITH HOLMIUM LASER LITHOTRIPSY, STENT PLACEMENT;  Surgeon: Lovie Arlyss CROME, MD;  Location: [facility];  Service: Urology;  Laterality: Right;    reports that she has quit smoking. Her smoking use included cigarettes. She started smoking about 61 years ago. She has a 31 pack-year smoking history. She has never used smokeless tobacco. She reports that she does not currently use alcohol. She reports that she does not use drugs. family history includes Alcohol  abuse in an other family member; Anxiety disorder in an other family member; Coronary artery disease in an other family member; Depression in an other family member; Stroke in an other family member. Allergies[1] Medications Ordered Prior to Encounter[2]      ROS:  All others reviewed and negative.  Objective        PE:  BP 128/82 (BP Location: Right Arm, Patient Position: Sitting, Cuff Size: Normal)   Pulse 76   Temp 98.6 F (37 C) (Oral)   Ht 5' 4 (1.626 m)   Wt 123 lb (55.8 kg)   SpO2 98%   BMI 21.11  kg/m                 Constitutional: Pt appears in NAD               HENT: Head: NCAT.                Right Ear: External ear normal.                 Left Ear: External ear normal. Bilat tm's with mild erythema.  Max sinus areas non tender.  Pharynx with mild erythema, no exudate               Eyes: . Pupils are equal, round, and reactive to light. Conjunctivae and EOM are normal               Nose: without d/c or deformity               Neck: Neck supple. Gross normal ROM               Cardiovascular: Normal rate and regular rhythm.                 Pulmonary/Chest: Effort normal and breath sounds without rales or wheezing.                Abd:  Soft, NT, ND, + BS, no organomegaly               Neurological: Pt is alert. At baseline orientation, motor grossly intact               Skin: Skin is warm. No rashes, no other new lesions, LE edema - none               Psychiatric: Pt behavior is normal without agitation   Micro: none  Cardiac tracings I have personally interpreted today:  none  Pertinent Radiological findings (summarize): none   Lab Results  Component Value Date   WBC 6.1 08/09/2024   HGB 12.1 08/09/2024   HCT 36.2 08/09/2024   PLT 263.0 08/09/2024   GLUCOSE 91 08/09/2024   CHOL 193 08/09/2024   TRIG 197.0 (H) 08/09/2024   HDL 70.60 08/09/2024   LDLDIRECT 121.4 12/02/2010   LDLCALC 83 08/09/2024   ALT 11 08/09/2024   AST 18 08/09/2024   NA 137 08/09/2024   K 3.9 08/09/2024   CL 105 08/09/2024   CREATININE 1.67 (H) 08/09/2024   BUN 24 (H) 08/09/2024   CO2 25 08/09/2024   TSH 1.20 08/09/2024   INR 0.9 10/10/2023   HGBA1C 6.1 08/09/2024   Assessment/Plan:  Kathy Vargas is a 73 y.o. White or Caucasian [1] female with  has a past medical history of ABDOMINAL TENDERNESS (01/21/2011), ACUTE GINGIVITIS NONPLAQUE INDUCED (06/11/2010), ADD (09/05/2008), Alcohol abuse, in remission (09/27/2007), ALLERGIC RHINITIS (03/02/2009), Anemia, ANXIETY (07/02/2007), BACK PAIN  (  05/19/2009), CHRONIC OBSTRUCTIVE PULMONARY DISEASE, ACUTE EXACERBATION (08/06/2009), COPD (07/02/2007), DEGENERATIVE JOINT DISEASE, CERVICAL SPINE (07/02/2007), DEPRESSION (07/02/2007), Dyspnea, GASTROENTERITIS, VIRAL (11/29/2010), GERD (01/21/2011), Headache(784.0) (07/02/2007), History of kidney stones, Hypertension, HYPOTHYROIDISM (07/02/2007), Lumbar disc disease, Lumbar pain with radiation down right leg (06/01/2011), OSTEOARTHRITIS (07/02/2007), Other and unspecified ovarian cyst (05/19/2009), PAIN, CHRONIC, DUE TO TRAUMA (07/02/2007), PELVIC  PAIN (03/29/2010), UTI (05/19/2009), and Vitamin D  insufficiency.  COPD (chronic obstructive pulmonary disease) (HCC) Stable,pt to continue inhaler prn  Vitamin D  deficiency Last vitamin D  Lab Results  Component Value Date   VD25OH 85.28 08/09/2024   Stable, cont oral replacement   Hyperlipidemia Lab Results  Component Value Date   LDLCALC 83 08/09/2024   Stable, pt to continue lower chol diet   Hyperglycemia Lab Results  Component Value Date   HGBA1C 6.1 08/09/2024   Stable, pt to continue current medical treatment  - diet, wt control   Essential hypertension BP Readings from Last 3 Encounters:  11/01/24 128/82  10/18/24 (!) 208/116  08/09/24 (!) 178/112   Stable, pt to continue medical treatment norvasc  10 every day, hydralazine  100 tid   Chronic kidney disease Ckd3a  Lab Results  Component Value Date   CREATININE 1.67 (H) 08/09/2024   Stable overall, cont to avoid nephrotoxins,l for renal referral  Allergic rhinitis .Mild to mod, for otc allegra and or nasacort  asd,  to f/u any worsening symptoms or concerns  Left foot pain Etiology unclear, chronic worsening, for ortho referral  B12 deficiency Lab Results  Component Value Date   VITAMINB12 >1500 (H) 08/09/2024   overcontrolled, cont oral replacement - b12 1000 mcg decreased to qod  Followup: Return in about 6 months (around 05/02/2025).  Lynwood Rush, MD  11/03/2024 4:40 PM [facility]     [1]  Allergies Allergen Reactions   Morphine Nausea And Vomiting   Ceftin  [Cefuroxime ]     Nausea and vomiting  [2]  Current Outpatient Medications on File Prior to Visit  Medication Sig Dispense Refill   acetaminophen  (TYLENOL ) 325 MG tablet Take 1-2 tablets (325-650 mg total) by mouth every 4 (four) hours as needed for mild pain (pain score 1-3). (Patient not taking: Reported on 08/09/2024)     diclofenac  Sodium (VOLTAREN ) 1 % GEL Apply 2 g topically 4 (four) times daily. 100 g 0   loratadine  (CLARITIN ) 10 MG tablet Take 1 tablet (10 mg total) by mouth daily.     senna-docusate (SENOKOT-S) 8.6-50 MG tablet Take 2 tablets by mouth 2 (two) times daily.     triamcinolone  cream (KENALOG ) 0.1 % Apply 1 Application topically 2 (two) times daily. 30 g 0   [DISCONTINUED] FLUoxetine  (PROZAC ) 20 MG capsule Take 1 capsule (20 mg total) by mouth daily. 30 capsule 11   No current facility-administered medications on file prior to visit.

## 2024-11-01 NOTE — Patient Instructions (Signed)
 Please continue all other medications as before, and refills have been done if requested.  Please have the pharmacy call with any other refills you may need.  Please continue your efforts at being more active, low cholesterol diet, and weight control.  You are otherwise up to date with prevention measures today.  Please keep your appointments with your specialists as you may have planned  You will be contacted regarding the referral for: Renal, and Orthopedic  Ok to reduce the B12 to every other day  Please make an Appointment to return in 6 months, or sooner if needed, also with Lab Appointment for testing done 3-5 days before at the FIRST FLOOR Lab (so this is for TWO appointments - please see the scheduling desk as you leave)

## 2024-11-03 ENCOUNTER — Encounter: Payer: Self-pay | Admitting: Internal Medicine

## 2024-11-03 DIAGNOSIS — M79672 Pain in left foot: Secondary | ICD-10-CM | POA: Insufficient documentation

## 2024-11-03 DIAGNOSIS — E538 Deficiency of other specified B group vitamins: Secondary | ICD-10-CM | POA: Insufficient documentation

## 2024-11-03 NOTE — Assessment & Plan Note (Signed)
 Mild to mod, for otc allegra and/or nasacort asd,  to f/u any worsening symptoms or concerns

## 2024-11-03 NOTE — Assessment & Plan Note (Signed)
 Lab Results  Component Value Date   VITAMINB12 >1500 (H) 08/09/2024   overcontrolled, cont oral replacement - b12 1000 mcg decreased to qod

## 2024-11-03 NOTE — Assessment & Plan Note (Signed)
Stable, pt to continue inhaler prn °

## 2024-11-03 NOTE — Assessment & Plan Note (Signed)
 Ckd3a  Lab Results  Component Value Date   CREATININE 1.67 (H) 08/09/2024   Stable overall, cont to avoid nephrotoxins,l for renal referral

## 2024-11-03 NOTE — Assessment & Plan Note (Signed)
 Last vitamin D  Lab Results  Component Value Date   VD25OH 85.28 08/09/2024   Stable, cont oral replacement

## 2024-11-03 NOTE — Assessment & Plan Note (Signed)
 BP Readings from Last 3 Encounters:  11/01/24 128/82  10/18/24 (!) 208/116  08/09/24 (!) 178/112   Stable, pt to continue medical treatment norvasc  10 every day, hydralazine  100 tid

## 2024-11-03 NOTE — Assessment & Plan Note (Signed)
 Lab Results  Component Value Date   LDLCALC 83 08/09/2024   Stable, pt to continue lower chol diet

## 2024-11-03 NOTE — Assessment & Plan Note (Signed)
 Etiology unclear, chronic worsening, for ortho referral

## 2024-11-03 NOTE — Assessment & Plan Note (Signed)
 Lab Results  Component Value Date   HGBA1C 6.1 08/09/2024   Stable, pt to continue current medical treatment  - diet,wt control

## 2024-11-13 ENCOUNTER — Other Ambulatory Visit (HOSPITAL_COMMUNITY): Payer: Self-pay

## 2024-11-22 ENCOUNTER — Ambulatory Visit: Admitting: Family

## 2024-12-05 ENCOUNTER — Other Ambulatory Visit (HOSPITAL_COMMUNITY): Payer: Self-pay

## 2024-12-12 ENCOUNTER — Telehealth: Payer: Self-pay

## 2024-12-12 ENCOUNTER — Other Ambulatory Visit (HOSPITAL_COMMUNITY): Payer: Self-pay

## 2024-12-12 DIAGNOSIS — M479 Spondylosis, unspecified: Secondary | ICD-10-CM

## 2024-12-12 DIAGNOSIS — M79604 Pain in right leg: Secondary | ICD-10-CM

## 2024-12-12 MED ORDER — AMPHETAMINE-DEXTROAMPHETAMINE 30 MG PO TABS
30.0000 mg | ORAL_TABLET | Freq: Two times a day (BID) | ORAL | 0 refills | Status: AC
  Filled 2024-12-12: qty 60, 30d supply, fill #0

## 2024-12-12 NOTE — Telephone Encounter (Signed)
 Ok with me but would need to be ok with Dr Garald.    Thanks

## 2024-12-12 NOTE — Telephone Encounter (Signed)
 Copied from CRM #[id number]. Topic: Clinical - Medication Refill >> Dec 12, 2024 10:22 AM Amber H wrote: Medication: amphetamine -dextroamphetamine  (ADDERALL ) 30 MG tablet   Has the patient contacted their pharmacy? No (Agent: If no, request that the patient contact the pharmacy for the refill. If patient does not wish to contact the pharmacy document the reason why and proceed with request.) (Agent: If yes, when and what did the pharmacy advise?)  This is the patient's preferred pharmacy:  [place] [facility] [address] [place] [address] Phone: [phone] Fax: [phone]   Is this the correct pharmacy for this prescription? Yes If no, delete pharmacy and type the correct one.   Has the prescription been filled recently? Yes  Is the patient out of the medication? No but wants medication for Feb due to provider leaving.   Has the patient been seen for an appointment in the last year OR does the patient have an upcoming appointment? Yes, 11/01/2024.   Can we respond through MyChart? No  Agent: Please be advised that Rx refills may take up to 3 business days. We ask that you follow-up with your pharmacy.

## 2024-12-12 NOTE — Telephone Encounter (Signed)
 Copied from CRM #[id number]. Topic: Appointments - Scheduling Inquiry for Clinic >> Dec 12, 2024 10:18 AM Jeoffrey H wrote: Reason for CRM: Patient wanted to know if Dr. Garald, [facility] could accept her as a new patient. She does not want to see any female doctors and she does not want to be seen at other locations.   [phone]

## 2024-12-12 NOTE — Addendum Note (Signed)
 Addended by: NORLEEN LYNWOOD ORN on: 12/12/2024 12:37 PM   Modules accepted: Orders

## 2024-12-12 NOTE — Telephone Encounter (Signed)
 Copied from CRM [id number]. Topic: Referral - Request for Referral >> Dec 12, 2024  9:59 AM Jeoffrey H wrote: Did the patient discuss referral with their provider in the last year? Yes (If No - schedule appointment) (If Yes - send message)  Appointment offered? Yes, she did not want to see the other provider she was referred   Type of order/referral and detailed reason for visit: Orthopedic issues   Preference of office, provider, location: Reyes KYM Billing, MD at [facility] [address], [place], [place]   If referral order, have you been seen by this specialty before? Yes (If Yes, this issue or another issue? When? Where?  Can we respond through MyChart? No- wants a phone call [phone]

## 2024-12-12 NOTE — Telephone Encounter (Signed)
 Ok this is done

## 2024-12-12 NOTE — Telephone Encounter (Signed)
 Ok this referral was resent with pt preference noted, thanks

## 2024-12-13 NOTE — Telephone Encounter (Signed)
Unfortunately, I'm not able to accept any more new patients at this time.  I'm sorry! Thank you!  

## 2024-12-13 NOTE — Telephone Encounter (Signed)
 Tried to reach pt to inform her per Dr. SHAUNNA as follows Unfortunately, I'm not able to accept any more new patients at this time.  I'm sorry! Thank you!

## 2025-01-09 ENCOUNTER — Ambulatory Visit: Admitting: Pulmonary Disease

## 2025-02-07 ENCOUNTER — Ambulatory Visit: Admitting: Internal Medicine
